# Patient Record
Sex: Female | Born: 1995
Health system: Southern US, Community
[De-identification: ages and names within clinical notes are randomized; demographics above are authoritative.]

## PROBLEM LIST (undated history)

## (undated) ENCOUNTER — Ambulatory Visit

## (undated) DIAGNOSIS — F32A Depression, unspecified: Secondary | ICD-10-CM

## (undated) DIAGNOSIS — F909 Attention-deficit hyperactivity disorder, unspecified type: Secondary | ICD-10-CM

## (undated) DIAGNOSIS — F329 Major depressive disorder, single episode, unspecified: Secondary | ICD-10-CM

---

## 2015-11-19 ENCOUNTER — Emergency Department (HOSPITAL_COMMUNITY)
Admission: EM | Admit: 2015-11-19 | Discharge: 2015-11-19 | Discharge status: Against Medical Advice | Payer: Medicaid Other | Attending: Emergency Medicine | Admitting: Emergency Medicine

## 2015-11-19 ENCOUNTER — Encounter (HOSPITAL_COMMUNITY): Payer: Self-pay | Admitting: Emergency Medicine

## 2015-11-19 DIAGNOSIS — R112 Nausea with vomiting, unspecified: Secondary | ICD-10-CM | POA: Diagnosis present

## 2015-11-19 DIAGNOSIS — R509 Fever, unspecified: Secondary | ICD-10-CM | POA: Diagnosis not present

## 2015-11-19 LAB — COMPREHENSIVE METABOLIC PANEL
ALK PHOS: 66 U/L (ref 38–126)
ALT: 11 U/L — AB (ref 14–54)
AST: 22 U/L (ref 15–41)
Albumin: 4.7 g/dL (ref 3.5–5.0)
Anion gap: 11 (ref 5–15)
BILIRUBIN TOTAL: 0.4 mg/dL (ref 0.3–1.2)
BUN: 6 mg/dL (ref 6–20)
CALCIUM: 9.7 mg/dL (ref 8.9–10.3)
CO2: 25 mmol/L (ref 22–32)
CREATININE: 0.75 mg/dL (ref 0.44–1.00)
Chloride: 110 mmol/L (ref 101–111)
GFR calc Af Amer: >60 mL/min (ref >60)
GLUCOSE: 98 mg/dL (ref 65–99)
Potassium: 3.4 mmol/L — ABNORMAL LOW (ref 3.5–5.1)
Sodium: 146 mmol/L — ABNORMAL HIGH (ref 135–145)
TOTAL PROTEIN: 7.8 g/dL (ref 6.5–8.1)

## 2015-11-19 LAB — CBC
HCT: 37.6 % (ref 36.0–46.0)
Hemoglobin: 12.5 g/dL (ref 12.0–15.0)
MCH: 24.4 pg — ABNORMAL LOW (ref 26.0–34.0)
MCHC: 33.2 g/dL (ref 30.0–36.0)
MCV: 73.4 fL — ABNORMAL LOW (ref 78.0–100.0)
PLATELETS: 155 10*3/uL (ref 150–400)
RBC: 5.12 MIL/uL — ABNORMAL HIGH (ref 3.87–5.11)
RDW: 15.6 % — AB (ref 11.5–15.5)
WBC: 7.4 10*3/uL (ref 4.0–10.5)

## 2015-11-19 LAB — LIPASE, BLOOD: Lipase: 22 U/L (ref 11–51)

## 2015-11-19 LAB — I-STAT BETA HCG BLOOD, ED (MC, WL, AP ONLY): I-stat hCG, quantitative: <5 m[IU]/mL (ref <5)

## 2015-11-19 NOTE — ED Notes (Signed)
Called for a second time, no answer 

## 2015-11-19 NOTE — ED Notes (Signed)
Called to take to room  No response from lobby  

## 2015-11-19 NOTE — ED Notes (Signed)
Called for a third time, no answer

## 2015-11-19 NOTE — ED Notes (Signed)
Per EMS pt complaint of n/v and fever for a week.

## 2016-05-08 ENCOUNTER — Emergency Department (HOSPITAL_COMMUNITY)
Admission: EM | Admit: 2016-05-08 | Discharge: 2016-05-08 | Disposition: A | Discharge status: Home / Self Care | Payer: Self-pay | Attending: Emergency Medicine | Admitting: Emergency Medicine

## 2016-05-08 ENCOUNTER — Encounter (HOSPITAL_COMMUNITY): Payer: Self-pay | Admitting: *Deleted

## 2016-05-08 ENCOUNTER — Emergency Department (HOSPITAL_COMMUNITY): Payer: Self-pay

## 2016-05-08 DIAGNOSIS — R55 Syncope and collapse: Secondary | ICD-10-CM

## 2016-05-08 LAB — CBC WITH DIFFERENTIAL/PLATELET
BASOS ABS: 0 10*3/uL (ref 0.0–0.1)
Basophils Relative: 1 %
EOS PCT: 1 %
Eosinophils Absolute: 0.1 10*3/uL (ref 0.0–0.7)
HEMATOCRIT: 35.9 % — AB (ref 36.0–46.0)
Hemoglobin: 12.1 g/dL (ref 12.0–15.0)
LYMPHS PCT: 20 %
Lymphs Abs: 1.1 10*3/uL (ref 0.7–4.0)
MCH: 24.4 pg — AB (ref 26.0–34.0)
MCHC: 33.7 g/dL (ref 30.0–36.0)
MCV: 72.5 fL — AB (ref 78.0–100.0)
MONO ABS: 0.4 10*3/uL (ref 0.1–1.0)
Monocytes Relative: 7 %
NEUTROS ABS: 4 10*3/uL (ref 1.7–7.7)
Neutrophils Relative %: 71 %
PLATELETS: 147 10*3/uL — AB (ref 150–400)
RBC: 4.95 MIL/uL (ref 3.87–5.11)
RDW: 15.9 % — AB (ref 11.5–15.5)
WBC: 5.6 10*3/uL (ref 4.0–10.5)

## 2016-05-08 LAB — COMPREHENSIVE METABOLIC PANEL
ALT: 12 U/L — ABNORMAL LOW (ref 14–54)
ANION GAP: 7 (ref 5–15)
AST: 21 U/L (ref 15–41)
Albumin: 4 g/dL (ref 3.5–5.0)
Alkaline Phosphatase: 66 U/L (ref 38–126)
BILIRUBIN TOTAL: 0.6 mg/dL (ref 0.3–1.2)
BUN: 5 mg/dL — ABNORMAL LOW (ref 6–20)
CHLORIDE: 108 mmol/L (ref 101–111)
CO2: 24 mmol/L (ref 22–32)
Calcium: 9.4 mg/dL (ref 8.9–10.3)
Creatinine, Ser: 0.88 mg/dL (ref 0.44–1.00)
Glucose, Bld: 90 mg/dL (ref 65–99)
POTASSIUM: 3.6 mmol/L (ref 3.5–5.1)
Sodium: 139 mmol/L (ref 135–145)
TOTAL PROTEIN: 7.2 g/dL (ref 6.5–8.1)

## 2016-05-08 LAB — I-STAT BETA HCG BLOOD, ED (MC, WL, AP ONLY)

## 2016-05-08 LAB — URINALYSIS, ROUTINE W REFLEX MICROSCOPIC
BILIRUBIN URINE: NEGATIVE
GLUCOSE, UA: NEGATIVE mg/dL
HGB URINE DIPSTICK: NEGATIVE
Ketones, ur: 15 mg/dL — AB
Nitrite: NEGATIVE
PROTEIN: NEGATIVE mg/dL
Specific Gravity, Urine: 1.013 (ref 1.005–1.030)
pH: 7 (ref 5.0–8.0)

## 2016-05-08 LAB — ETHANOL: Alcohol, Ethyl (B): <5 mg/dL (ref <5)

## 2016-05-08 LAB — RAPID URINE DRUG SCREEN, HOSP PERFORMED
AMPHETAMINES: NOT DETECTED
Barbiturates: NOT DETECTED
Benzodiazepines: NOT DETECTED
Cocaine: NOT DETECTED
OPIATES: NOT DETECTED
TETRAHYDROCANNABINOL: NOT DETECTED

## 2016-05-08 LAB — URINE MICROSCOPIC-ADD ON: RBC / HPF: NONE SEEN RBC/hpf (ref 0–5)

## 2016-05-08 LAB — TROPONIN I

## 2016-05-08 MED ORDER — SODIUM CHLORIDE 0.9 % IV BOLUS (SEPSIS)
1000.0000 mL | Freq: Once | INTRAVENOUS | Status: AC
  Administered 2016-05-08: 1000 mL via INTRAVENOUS

## 2016-05-08 NOTE — Discharge Instructions (Signed)
Syncope °Syncope means a person passes out (faints). The person usually wakes up in less than 5 minutes. It is important to seek medical care for syncope. °HOME CARE °· Have someone stay with you until you feel normal. °· Do not drive, use machines, or play sports until your doctor says it is okay. °· Keep all doctor visits as told. °· Lie down when you feel like you might pass out. Take deep breaths. Wait until you feel normal before standing up. °· Drink enough fluids to keep your pee (urine) clear or pale yellow. °· If you take blood pressure or heart medicine, get up slowly. Take several minutes to sit and then stand. °GET HELP RIGHT AWAY IF:  °· You have a severe headache. °· You have pain in the chest, belly (abdomen), or back. °· You are bleeding from the mouth or butt (rectum). °· You have black or tarry poop (stool). °· You have an irregular or very fast heartbeat. °· You have pain with breathing. °· You keep passing out, or you have shaking (seizures) when you pass out. °· You pass out when sitting or lying down. °· You feel confused. °· You have trouble walking. °· You have severe weakness. °· You have vision problems. °If you fainted, call for help (911 in [place] Do not drive yourself to the hospital. °  °This information is not intended to replace advice given to you by your health care provider. Make sure you discuss any questions you have with your health care provider. °  °Document Released: 04/07/2008 Document Revised: 03/06/2015 Document Reviewed: 12/19/2011 °Elsevier Interactive Patient Education ©2016 [employer] ° °

## 2016-05-08 NOTE — ED Provider Notes (Signed)
CSN: [id number]     Arrival date & time 05/08/16  1512 History   First MD Initiated Contact with Patient 05/08/16 1552     Chief Complaint  Patient presents with  . Seizures  Pt is a 20 yo bf who arrives via EMS for a possible seizure.  She has no history of seizures.  Pt said that all she's had to eat today is a Sprite early this am.  Pt does remember trying to catch the bus, and feeling funny and falling.  Pt remembers seeing hands and people trying to help her.  The pt said that she feels fine now.  She sustained no injury.   (Consider location/radiation/quality/duration/timing/severity/associated sxs/prior Treatment) Patient is a 20 y.o. female presenting with seizures. The history is provided by the patient.  Seizures Seizure activity on arrival: no   Seizure type:  Tonic Preceding symptoms: dizziness   Initial focality:  None Postictal symptoms: no confusion, no memory loss and no somnolence   Return to baseline: yes   Timing:  Once Progression:  Resolved Recent head injury:  No recent head injuries PTA treatment:  None History of seizures: no     History reviewed. No pertinent past medical history. History reviewed. No pertinent past surgical history. History reviewed. No pertinent family history. Social History  Substance Use Topics  . Smoking status: Unknown If Ever Smoked  . Smokeless tobacco: None  . Alcohol Use: No   OB History    No data available     Review of Systems  Neurological: Positive for seizures.  All other systems reviewed and are negative.     Allergies  Review of patient's allergies indicates no known allergies.  Home Medications   Prior to Admission medications   Not on File   BP 132/88 mmHg  Pulse 73  Resp 21  SpO2 100%  LMP 04/07/2016 Physical Exam  Constitutional: She is oriented to person, place, and time. She appears well-developed and well-nourished.  HENT:  Head: Normocephalic and atraumatic.  Right Ear: External ear  normal.  Left Ear: External ear normal.  Nose: Nose normal.  Mouth/Throat: Oropharynx is clear and moist.  Eyes: Conjunctivae and EOM are normal. Pupils are equal, round, and reactive to light.  Neck: Normal range of motion. Neck supple.  Cardiovascular: Normal rate, regular rhythm, normal heart sounds and intact distal pulses.   Pulmonary/Chest: Effort normal and breath sounds normal.  Abdominal: Soft. Bowel sounds are normal.  Musculoskeletal: Normal range of motion.  Neurological: She is alert and oriented to person, place, and time.  Skin: Skin is warm and dry.  Psychiatric: She has a normal mood and affect. Her behavior is normal. Judgment and thought content normal.  Nursing note and vitals reviewed.   ED Course  Procedures (including critical care time) Labs Review Labs Reviewed  CBC WITH DIFFERENTIAL/PLATELET - Abnormal; Notable for the following:    HCT 35.9 (*)    MCV 72.5 (*)    MCH 24.4 (*)    RDW 15.9 (*)    Platelets 147 (*)    All other components within normal limits  COMPREHENSIVE METABOLIC PANEL - Abnormal; Notable for the following:    BUN 5 (*)    ALT 12 (*)    All other components within normal limits  URINALYSIS, ROUTINE W REFLEX MICROSCOPIC (NOT AT [facility]) - Abnormal; Notable for the following:    APPearance CLOUDY (*)    Ketones, ur 15 (*)    Leukocytes, UA TRACE (*)  All other components within normal limits  URINE MICROSCOPIC-ADD ON - Abnormal; Notable for the following:    Squamous Epithelial / LPF 6-30 (*)    Bacteria, UA MANY (*)    Casts HYALINE CASTS (*)    All other components within normal limits  ETHANOL  TROPONIN I  URINE RAPID DRUG SCREEN, HOSP PERFORMED  I-STAT BETA HCG BLOOD, ED ([facility], [facility], [facility] ONLY)    Imaging Review Dg Chest 2 View  05/08/2016  CLINICAL DATA:  Seizure today, first episode. EXAM: CHEST  2 VIEW COMPARISON:  None. FINDINGS: Heart size is normal. Mediastinal shadows are normal. The lungs are clear. No bronchial  thickening. No infiltrate, mass, effusion or collapse. Pulmonary vascularity is normal. No bony abnormality other than mild spinal curvature. IMPRESSION: Normal chest other than mild spinal curvature. Electronically Signed   By: Paulina FusiMark  Shogry M.D.   On: 05/08/2016 17:41   Ct Head Wo Contrast  05/08/2016  CLINICAL DATA:  Witnessed tonic-clonic seizure today lasting 3 minutes, unknown past history EXAM: CT HEAD WITHOUT CONTRAST TECHNIQUE: Contiguous axial images were obtained from the base of the skull through the vertex without intravenous contrast. COMPARISON:  None FINDINGS: Normal ventricular morphology. No midline shift or mass effect. Normal appearance of brain parenchyma. No intracranial hemorrhage, mass lesion, or acute infarction. No extra-axial fluid collections. Visualized paranasal sinuses and mastoid air cells clear. Bones unremarkable. IMPRESSION: Normal exam. If patient has persistent or recurrent seizures, recommend MR imaging of the brain with and without contrast for further assessment. Electronically Signed   By: Ulyses SouthwardMark  Boles M.D.   On: 05/08/2016 17:52   I have personally reviewed and evaluated these images and lab results as part of my medical decision-making.   EKG Interpretation   Date/Time:  Thursday May 08 2016 15:20:06 EDT Ventricular Rate:  85 PR Interval:    QRS Duration: 80 QT Interval:  361 QTC Calculation: 430 R Axis:   46 Text Interpretation:  Sinus rhythm Confirmed by Jaymie Misch MD, Remas Sobel ([id number])  on 05/08/2016 4:29:08 PM      MDM  Pt's sx do not seem like seizure activity as she remembers the event.  I think it was a vaso vagal episode due to lack of eating and drinking.  Pt will be given the number to neurology to f/u with however.  Pt knows to drink lots of fluid and to return for worsening of sx.  Final diagnoses:  Vaso vagal episode       Jacalyn LefevreJulie Dillyn Menna, MD 05/08/16 1800

## 2016-05-08 NOTE — ED Notes (Signed)
Spoke to [name] and asked her to send tube down to

## 2016-05-08 NOTE — ED Notes (Signed)
Pt is in stable condition upon d/c and ambulates from ED. 

## 2016-05-08 NOTE — ED Notes (Signed)
[place] [facility] [place]. Pt had a witnessed tonic clonic seizure today that lasted approx. 3 min. Pt states she has no hx of seizures and denies ETOH/drug use.

## 2016-08-19 ENCOUNTER — Encounter (HOSPITAL_COMMUNITY): Payer: Self-pay | Admitting: *Deleted

## 2016-08-19 ENCOUNTER — Emergency Department (HOSPITAL_COMMUNITY)
Admission: EM | Admit: 2016-08-19 | Discharge: 2016-08-19 | Disposition: A | Discharge status: Home / Self Care | Payer: Medicaid Other | Attending: Emergency Medicine | Admitting: Emergency Medicine

## 2016-08-19 DIAGNOSIS — R51 Headache: Secondary | ICD-10-CM | POA: Insufficient documentation

## 2016-08-19 DIAGNOSIS — R519 Headache, unspecified: Secondary | ICD-10-CM

## 2016-08-19 DIAGNOSIS — F909 Attention-deficit hyperactivity disorder, unspecified type: Secondary | ICD-10-CM | POA: Insufficient documentation

## 2016-08-19 HISTORY — DX: Major depressive disorder, single episode, unspecified: F32.9

## 2016-08-19 HISTORY — DX: Depression, unspecified: F32.A

## 2016-08-19 HISTORY — DX: Attention-deficit hyperactivity disorder, unspecified type: F90.9

## 2016-08-19 LAB — CBC
HEMATOCRIT: 35 % — AB (ref 36.0–46.0)
HEMOGLOBIN: 11.7 g/dL — AB (ref 12.0–15.0)
MCH: 23.3 pg — ABNORMAL LOW (ref 26.0–34.0)
MCHC: 33.4 g/dL (ref 30.0–36.0)
MCV: 69.6 fL — AB (ref 78.0–100.0)
Platelets: 195 10*3/uL (ref 150–400)
RBC: 5.03 MIL/uL (ref 3.87–5.11)
RDW: 16.1 % — AB (ref 11.5–15.5)
WBC: 4.4 10*3/uL (ref 4.0–10.5)

## 2016-08-19 LAB — BASIC METABOLIC PANEL
ANION GAP: 5 (ref 5–15)
BUN: 9 mg/dL (ref 6–20)
CALCIUM: 9.3 mg/dL (ref 8.9–10.3)
CHLORIDE: 109 mmol/L (ref 101–111)
CO2: 26 mmol/L (ref 22–32)
Creatinine, Ser: 0.63 mg/dL (ref 0.44–1.00)
GFR calc non Af Amer: >60 mL/min (ref >60)
GLUCOSE: 95 mg/dL (ref 65–99)
POTASSIUM: 3.6 mmol/L (ref 3.5–5.1)
Sodium: 140 mmol/L (ref 135–145)

## 2016-08-19 LAB — POC URINE PREG, ED: Preg Test, Ur: NEGATIVE

## 2016-08-19 MED ORDER — BUTALBITAL-APAP-CAFFEINE 50-325-40 MG PO TABS: 1.0000 | ORAL_TABLET | Freq: Four times a day (QID) | ORAL | 0 refills | Status: AC | PRN

## 2016-08-19 NOTE — ED Provider Notes (Signed)
[facility]-EMERGENCY DEPT Provider Note   CSN: [id number] Arrival date & time: 08/19/16  1436     History   Chief Complaint Chief Complaint  Patient presents with  . Anxiety    HPI Cassidy Bruce is a 20 y.o. female.  HPI   20 year old female present c/o Headache. Patient states for the past week she has had intermittent frontal headache, with associated light sensitivity, occasional dizziness, and short-term memory loss. She is currently a Consulting civil engineer at [employer] She went to see her guidance counselor today and was telling her counselor about a headache. Counselor encouraged patient to go to ER for further evaluation. While in the office, patient reported feeling nauseous, and vomited once in the trash and her counselor call EMS and patient was brought here for further evaluation. She still endorsed a frontal headache, rated as 5 out of 10, with light sensitivity. She denies having fever, diplopia, loss of vision, URI symptoms, neck stiffness, rash, focal numbness or weakness. Denies any abnormal weight changes. No fever or night sweats. No confusion. She is now requesting to be discharged so she can return back to work. She denies any recent caffeine intake, no thyroid disease, denies SI/HI.      Past Medical History:  Diagnosis Date  . ADHD   . Depression     There are no active problems to display for this patient.   History reviewed. No pertinent surgical history.  OB History    No data available       Home Medications    Prior to Admission medications   Not on File    Family History No family history on file.  Social History Social History  Substance Use Topics  . Smoking status: Unknown If Ever Smoked  . Smokeless tobacco: Never Used  . Alcohol use No     Allergies   Review of patient's allergies indicates no known allergies.   Review of Systems Review of Systems  All other systems reviewed and are negative.    Physical Exam Updated Vital Signs BP  123/76   Pulse 82   Temp 98.6 F (37 C)   Resp 16   Ht 5\' 4"  (1.626 m)   Wt 52.2 kg   SpO2 100%   BMI 19.74 kg/m   Physical Exam  Constitutional: She is oriented to person, place, and time. She appears well-developed and well-nourished. No distress.  HENT:  Head: Atraumatic.  Right Ear: External ear normal.  Left Ear: External ear normal.  Nose: Nose normal.  Mouth/Throat: Oropharynx is clear and moist.  Eyes: Conjunctivae and EOM are normal. Pupils are equal, round, and reactive to light.  Neck: Normal range of motion. Neck supple.  No nuchal rigidity  Cardiovascular: Normal rate and regular rhythm.   Pulmonary/Chest: Effort normal and breath sounds normal.  Abdominal: Soft. Bowel sounds are normal.  Neurological: She is alert and oriented to person, place, and time. No cranial nerve deficit or sensory deficit. She displays a negative Romberg sign. GCS eye subscore is 4. GCS verbal subscore is 5. GCS motor subscore is 6.  Skin: No rash noted.  Psychiatric: She has a normal mood and affect. Her speech is normal and behavior is normal. Thought content normal.  Nursing note and vitals reviewed.    ED Treatments / Results  Labs (all labs ordered are listed, but only abnormal results are displayed) Labs Reviewed  CBC - Abnormal; Notable for the following:       Result Value  Hemoglobin 11.7 (*)    HCT 35.0 (*)    MCV 69.6 (*)    MCH 23.3 (*)    RDW 16.1 (*)    All other components within normal limits  BASIC METABOLIC PANEL  POC URINE PREG, ED    EKG  EKG Interpretation None       Radiology No results found.  Procedures Procedures (including critical care time)  Medications Ordered in ED Medications - No data to display   Initial Impression / Assessment and Plan / ED Course  I have reviewed the triage vital signs and the nursing notes.  Pertinent labs & imaging results that were available during my care of the patient were reviewed by me and considered  in my medical decision making (see chart for details).  Clinical Course    BP 123/76   Pulse 82   Temp 98.6 F (37 C)   Resp 16   Ht 5\' 4"  (1.626 m)   Wt 52.2 kg   SpO2 100%   BMI 19.74 kg/m    Final Clinical Impressions(s) / ED Diagnoses   Final diagnoses:  Bad headache    New Prescriptions New Prescriptions   BUTALBITAL-ACETAMINOPHEN-CAFFEINE (FIORICET, ESGIC) 50-325-40 MG TABLET    Take 1-2 tablets by mouth every 6 (six) hours as needed for headache.   7:00 PM Patient complaining of headache. No red flags. No fever or nuchal rigidity concerning for meningitis, no acute onset thunderclap headache concerning for subarachnoid hemorrhage, no focal neuro deficit concerning for stroke. Will provide symptomatically treatment. She report mild anxiety but no concerning aspects that would warrant psychiatric evaluation at this time. Patient stable for discharge. Suspect tension headache causing her sxs.      Fayrene HelperBowie Mahalia Dykes, PA-C 08/19/16 1904    Nira ConnPedro Eduardo Cardama, MD 08/22/16 [phone]

## 2016-08-19 NOTE — ED Triage Notes (Signed)
Per EMS - [facility] regarding migraine headaches and anxiety she has been experiencing.  Patient states she remains dizzy, but her gait is stead and she ambulates without assistance on arrival to triage.  Patient denies headache currently and her only complaint is anxiety and dizziness.  Patient states these episodes have been intermittent x2 weeks.  Patient's vitals WNL 128/75, HR 80, RR 12, 97% on RA, CBG 101.

## 2016-11-10 ENCOUNTER — Emergency Department (HOSPITAL_COMMUNITY)
Admission: EM | Admit: 2016-11-10 | Discharge: 2016-11-11 | Disposition: A | Discharge status: Home / Self Care | Payer: Self-pay | Attending: Emergency Medicine | Admitting: Emergency Medicine

## 2016-11-10 ENCOUNTER — Emergency Department (HOSPITAL_COMMUNITY): Payer: Self-pay

## 2016-11-10 ENCOUNTER — Encounter (HOSPITAL_COMMUNITY): Payer: Self-pay

## 2016-11-10 DIAGNOSIS — F909 Attention-deficit hyperactivity disorder, unspecified type: Secondary | ICD-10-CM | POA: Insufficient documentation

## 2016-11-10 DIAGNOSIS — R0781 Pleurodynia: Secondary | ICD-10-CM

## 2016-11-10 DIAGNOSIS — R071 Chest pain on breathing: Secondary | ICD-10-CM | POA: Insufficient documentation

## 2016-11-10 DIAGNOSIS — R55 Syncope and collapse: Secondary | ICD-10-CM | POA: Insufficient documentation

## 2016-11-10 LAB — I-STAT BETA HCG BLOOD, ED (MC, WL, AP ONLY): I-stat hCG, quantitative: <5 m[IU]/mL (ref <5)

## 2016-11-10 LAB — CBC WITH DIFFERENTIAL/PLATELET
BASOS ABS: 0 10*3/uL (ref 0.0–0.1)
Basophils Relative: 0 %
EOS PCT: 1 %
Eosinophils Absolute: 0.1 10*3/uL (ref 0.0–0.7)
HEMATOCRIT: 34.1 % — AB (ref 36.0–46.0)
Hemoglobin: 11.2 g/dL — ABNORMAL LOW (ref 12.0–15.0)
LYMPHS ABS: 0.5 10*3/uL — AB (ref 0.7–4.0)
Lymphocytes Relative: 6 %
MCH: 22.8 pg — AB (ref 26.0–34.0)
MCHC: 32.8 g/dL (ref 30.0–36.0)
MCV: 69.5 fL — AB (ref 78.0–100.0)
MONOS PCT: 12 %
Monocytes Absolute: 0.9 10*3/uL (ref 0.1–1.0)
NEUTROS ABS: 6.3 10*3/uL (ref 1.7–7.7)
Neutrophils Relative %: 81 %
Platelets: 138 10*3/uL — ABNORMAL LOW (ref 150–400)
RBC: 4.91 MIL/uL (ref 3.87–5.11)
RDW: 16.2 % — AB (ref 11.5–15.5)
WBC: 7.8 10*3/uL (ref 4.0–10.5)

## 2016-11-10 LAB — I-STAT CHEM 8, ED
BUN: 6 mg/dL (ref 6–20)
CALCIUM ION: 1.21 mmol/L (ref 1.15–1.40)
Chloride: 104 mmol/L (ref 101–111)
Creatinine, Ser: 0.7 mg/dL (ref 0.44–1.00)
GLUCOSE: 98 mg/dL (ref 65–99)
HCT: 37 % (ref 36.0–46.0)
HEMOGLOBIN: 12.6 g/dL (ref 12.0–15.0)
Potassium: 3.8 mmol/L (ref 3.5–5.1)
Sodium: 140 mmol/L (ref 135–145)
TCO2: 27 mmol/L (ref 0–100)

## 2016-11-10 LAB — D-DIMER, QUANTITATIVE: D-Dimer, Quant: 0.76 ug/mL-FEU — ABNORMAL HIGH (ref 0.00–0.50)

## 2016-11-10 MED ORDER — IOPAMIDOL (ISOVUE-370) INJECTION 76%
INTRAVENOUS | Status: AC
  Administered 2016-11-10: 70 mL
  Filled 2016-11-10: qty 100

## 2016-11-10 MED ORDER — MORPHINE SULFATE (PF) 4 MG/ML IV SOLN
4.0000 mg | Freq: Once | INTRAVENOUS | Status: AC
Start: 2016-11-10 — End: 2016-11-11
  Administered 2016-11-11: 4 mg via INTRAVENOUS
  Filled 2016-11-10: qty 1

## 2016-11-10 MED ORDER — SODIUM CHLORIDE 0.9 % IV SOLN
INTRAVENOUS | Status: DC
  Administered 2016-11-10: 22:00:00 via INTRAVENOUS

## 2016-11-10 MED ORDER — HYDROCODONE-ACETAMINOPHEN 5-325 MG PO TABS: 1.0000 | ORAL_TABLET | ORAL | Status: DC

## 2016-11-10 NOTE — ED Triage Notes (Signed)
Pt presents to the er after having some seizure like activity at work and falling and hitting her head, she was still having seizure like activity on ems arrival, however she was not posticital afterwards, she has a history of the same, alert and oriented on arrival to the ed, with complaints of pain to her head and pelvis. Able to move all extremities.

## 2016-11-10 NOTE — ED Provider Notes (Signed)
[facility]-EMERGENCY DEPT Provider Note   CSN: [id number] Arrival date & time: 11/10/16  2111     History   Chief Complaint Chief Complaint  Patient presents with  . Seizures    HPI Cassidy Bruce is a 21 y.o. female.  HPI Patient presents to the emergency room  after a syncopal episode. Patient was at work today when she experienced some sharp chest pain. She started developing trouble with a dry cough yesterday.  Patient has had some pain with coughing and inspiration. At work she spends another episode of the cost and her chest hurting and she began feeling lightheaded. Next thing she remembers is being on the ground at work with paramedics. Coworkers saw her fall to the ground. There is question of whether she had some shaking after the event. There was no confusion once she regained consciousness. Patient is currently on her menstrual period Past Medical History:  Diagnosis Date  . ADHD   . Depression     There are no active problems to display for this patient.   History reviewed. No pertinent surgical history.  OB History    No data available       Home Medications    Prior to Admission medications   Medication Sig Start Date End Date Taking? Authorizing Provider  butalbital-acetaminophen-caffeine (FIORICET, ESGIC) 50-325-40 MG tablet Take 1-2 tablets by mouth every 6 (six) hours as needed for headache. 08/19/16 08/19/17 Yes Fayrene Helper, PA-C  naproxen (NAPROSYN) 375 MG tablet Take 1 tablet (375 mg total) by mouth 2 (two) times daily. 11/11/16   Linwood Dibbles, MD    Family History No family history on file.  Social History Social History  Substance Use Topics  . Smoking status: Unknown If Ever Smoked  . Smokeless tobacco: Never Used  . Alcohol use No     Allergies   Patient has no known allergies.   Review of Systems Review of Systems  All other systems reviewed and are negative.    Physical Exam Updated Vital Signs BP 112/64   Pulse 100   Resp 24   Ht  5\' 4"  (1.626 m)   Wt 45.4 kg   LMP 11/07/2016   SpO2 100%   BMI 17.16 kg/m   Physical Exam  Constitutional: No distress.  HENT:  Head: Normocephalic and atraumatic.  Right Ear: External ear normal.  Left Ear: External ear normal.  Eyes: Conjunctivae are normal. Right eye exhibits no discharge. Left eye exhibits no discharge. No scleral icterus.  Neck: Neck supple. No tracheal deviation present.  Cardiovascular: Normal rate, regular rhythm and intact distal pulses.   Pulmonary/Chest: Effort normal and breath sounds normal. No stridor. No respiratory distress. She has no wheezes. She has no rales.  Abdominal: Soft. Bowel sounds are normal. She exhibits no distension. There is tenderness (mild suprapubic). There is no rebound and no guarding.  Musculoskeletal: She exhibits no edema or tenderness.  No cervical, thoracic or lumbar spinal tenderness, no tenderness to palpation in her extremities, no evidence of injury  Neurological: She is alert. She has normal strength. No cranial nerve deficit (no facial droop, extraocular movements intact, no slurred speech) or sensory deficit. She exhibits normal muscle tone. She displays no seizure activity. Coordination normal.  Skin: Skin is warm and dry. No rash noted.  Psychiatric: She has a normal mood and affect.  Nursing note and vitals reviewed.    ED Treatments / Results  Labs (all labs ordered are listed, but only abnormal results are displayed)  Labs Reviewed  CBC WITH DIFFERENTIAL/PLATELET - Abnormal; Notable for the following:       Result Value   Hemoglobin 11.2 (*)    HCT 34.1 (*)    MCV 69.5 (*)    MCH 22.8 (*)    RDW 16.2 (*)    Platelets 138 (*)    Lymphs Abs 0.5 (*)    All other components within normal limits  D-DIMER, QUANTITATIVE (NOT AT [facility]) - Abnormal; Notable for the following:    D-Dimer, Quant 0.76 (*)    All other components within normal limits  I-STAT CHEM 8, ED  I-STAT BETA HCG BLOOD, ED ([facility], [facility], [facility] ONLY)    POCT CBG (FASTING - GLUCOSE)-MANUAL ENTRY    Radiology Dg Chest 2 View  Result Date: 11/10/2016 CLINICAL DATA:  Cough, syncope, chest pain, and headache. Possible seizure. Possibly passed out. Vomiting. EXAM: CHEST  2 VIEW COMPARISON:  05/08/2016 FINDINGS: The heart size and mediastinal contours are within normal limits. Both lungs are clear. The visualized skeletal structures are unremarkable. IMPRESSION: No active cardiopulmonary disease. Electronically Signed   By: Burman Nieves M.D.   On: 11/10/2016 22:02   Ct Head Wo Contrast  Result Date: 11/10/2016 CLINICAL DATA:  Syncopal episode and headache EXAM: CT HEAD WITHOUT CONTRAST TECHNIQUE: Contiguous axial images were obtained from the base of the skull through the vertex without intravenous contrast. COMPARISON:  05/08/2016 FINDINGS: Brain: No evidence of acute infarction, hemorrhage, hydrocephalus, extra-axial collection or mass lesion/mass effect. Vascular: No hyperdense vessel or unexpected calcification. Skull: Normal. Negative for fracture or focal lesion. Sinuses/Orbits: No acute finding. Other: None IMPRESSION: No CT evidence for acute intracranial abnormality Electronically Signed   By: Jasmine Pang M.D.   On: 11/10/2016 22:14   Ct Angio Chest Pe W And/or Wo Contrast  Result Date: 11/11/2016 CLINICAL DATA:  Chest pain and syncope at work.  Elevated D-dimer. EXAM: CT ANGIOGRAPHY CHEST WITH CONTRAST TECHNIQUE: Multidetector CT imaging of the chest was performed using the standard protocol during bolus administration of intravenous contrast. Multiplanar CT image reconstructions and MIPs were obtained to evaluate the vascular anatomy. CONTRAST:  70 mL Isovue 370 COMPARISON:  None. FINDINGS: Cardiovascular: Satisfactory opacification of the pulmonary arteries to the segmental level. No evidence of pulmonary embolism. Normal heart size. No pericardial effusion. Normal caliber thoracic aorta. No aortic dissection. Mediastinum/Nodes: No enlarged  mediastinal, hilar, or axillary lymph nodes. Thyroid gland, trachea, and esophagus demonstrate no significant findings. Increased density in the anterior mediastinum consistent with residual thymic tissue. Lungs/Pleura: Lungs are clear. No pleural effusion or pneumothorax. Upper Abdomen: No acute abnormality. Musculoskeletal: No chest wall abnormality. No acute or significant osseous findings. Review of the MIP images confirms the above findings. IMPRESSION: No evidence of significant pulmonary embolus. No evidence of active pulmonary disease. Electronically Signed   By: Burman Nieves M.D.   On: 11/11/2016 00:05    Procedures Procedures (including critical care time)  Medications Ordered in ED Medications  0.9 %  sodium chloride infusion ( Intravenous Stopped 11/11/16 0010)  morphine 4 MG/ML injection 4 mg (4 mg Intravenous Given 11/11/16 0024)  iopamidol (ISOVUE-370) 76 % injection (70 mLs  Contrast Given 11/10/16 2347)     Initial Impression / Assessment and Plan / ED Course  I have reviewed the triage vital signs and the nursing notes.  Pertinent labs & imaging results that were available during my care of the patient were reviewed by me and considered in my medical decision making (see chart for  details).  Clinical Course as of Nov 12 43  Sheral FlowMon Nov 10, 2016  2238 EKG sinus tachycardia rate 101, normal axis, normal intervals  [JK]    Clinical Course User Index [JK] Linwood DibblesJon Kadey Mihalic, MD    Patient presented to emergency room with chest pain followed by an event that sounds most like syncope rather than a seizure. She did not have any postictal phase. The patient had no recurrent episodes in the emergency room she remained alert and oriented.  Regarding her chest pain she did have a d-dimer because of the sinus tachycardia. This was mildly elevated. CT scan of the chest was performed. Patient does not have any evidence of pulmonary embolism or any other acute abnormality.  Symptoms may be related  to pleurisy. At this point I do not find any evidence of any emergency medical condition. Patient is stable for discharge. I recommend follow-up with primary doctor. Consider outpatient EEG testing or Holter monitor testing  Final Clinical Impressions(s) / ED Diagnoses   Final diagnoses:  Vasovagal syncope  Pleuritic chest pain    New Prescriptions New Prescriptions   NAPROXEN (NAPROSYN) 375 MG TABLET    Take 1 tablet (375 mg total) by mouth 2 (two) times daily.     Linwood DibblesJon Jacklyne Baik, MD 11/11/16 [phone]

## 2016-11-10 NOTE — ED Notes (Signed)
Patient transported to X-ray 

## 2016-11-10 NOTE — ED Notes (Signed)
Patient transported to CT 

## 2016-11-11 MED ORDER — NAPROXEN 375 MG PO TABS: 375.0000 mg | ORAL_TABLET | Freq: Two times a day (BID) | ORAL | 0 refills | Status: DC

## 2016-11-11 NOTE — ED Notes (Signed)
Pt to BR with slow, steady gait.  Mildly off balance.  Holding onto family for support.

## 2016-11-11 NOTE — ED Provider Notes (Signed)
At time of discharge, pt noted to have fever She is awake/alert, no distress Imaging reveals no Pneumonia Denies dysuria She has had recent cough She denies HA at this time Will d/c home    Zadie Rhineonald Julian Medina, MD 11/11/16 [phone]

## 2016-11-11 NOTE — ED Notes (Signed)
Attending notified of pt's fever; temperature not taken in triage.  He spoke to patient, cleared her to go home with instructions to take tylenol or motrin for fever.

## 2016-11-11 NOTE — ED Notes (Signed)
Pt started feeling very hot during morphine administration.  This RN stopped pushing the morphine and drew back, then flushed blood back into patient.  Dr. Lynelle DoctorKnapp in room and aware.  The morphine was diluted 1:1 with saline.  0.5 mg of fluid left.  Will waste with another RN.

## 2016-12-01 ENCOUNTER — Emergency Department (HOSPITAL_COMMUNITY)
Admission: EM | Admit: 2016-12-01 | Discharge: 2016-12-01 | Disposition: A | Discharge status: Home / Self Care | Payer: Medicaid Other | Attending: Emergency Medicine | Admitting: Emergency Medicine

## 2016-12-01 ENCOUNTER — Encounter (HOSPITAL_COMMUNITY): Payer: Self-pay | Admitting: Emergency Medicine

## 2016-12-01 DIAGNOSIS — N12 Tubulo-interstitial nephritis, not specified as acute or chronic: Secondary | ICD-10-CM | POA: Insufficient documentation

## 2016-12-01 DIAGNOSIS — F909 Attention-deficit hyperactivity disorder, unspecified type: Secondary | ICD-10-CM | POA: Insufficient documentation

## 2016-12-01 LAB — CBC
HEMATOCRIT: 36.6 % (ref 36.0–46.0)
HEMOGLOBIN: 12.2 g/dL (ref 12.0–15.0)
MCH: 23.2 pg — AB (ref 26.0–34.0)
MCHC: 33.3 g/dL (ref 30.0–36.0)
MCV: 69.7 fL — AB (ref 78.0–100.0)
Platelets: 146 10*3/uL — ABNORMAL LOW (ref 150–400)
RBC: 5.25 MIL/uL — ABNORMAL HIGH (ref 3.87–5.11)
RDW: 16.7 % — ABNORMAL HIGH (ref 11.5–15.5)
WBC: 8.4 10*3/uL (ref 4.0–10.5)

## 2016-12-01 LAB — BASIC METABOLIC PANEL
ANION GAP: 7 (ref 5–15)
BUN: 7 mg/dL (ref 6–20)
CHLORIDE: 108 mmol/L (ref 101–111)
CO2: 24 mmol/L (ref 22–32)
Calcium: 9.1 mg/dL (ref 8.9–10.3)
Creatinine, Ser: 0.69 mg/dL (ref 0.44–1.00)
GFR calc Af Amer: >60 mL/min (ref >60)
GFR calc non Af Amer: >60 mL/min (ref >60)
GLUCOSE: 90 mg/dL (ref 65–99)
POTASSIUM: 4.1 mmol/L (ref 3.5–5.1)
Sodium: 139 mmol/L (ref 135–145)

## 2016-12-01 LAB — URINALYSIS, ROUTINE W REFLEX MICROSCOPIC
Bilirubin Urine: NEGATIVE
GLUCOSE, UA: NEGATIVE mg/dL
KETONES UR: NEGATIVE mg/dL
Nitrite: POSITIVE — AB
PH: 7 (ref 5.0–8.0)
Protein, ur: 30 mg/dL — AB
SPECIFIC GRAVITY, URINE: 1.013 (ref 1.005–1.030)

## 2016-12-01 LAB — HEPATIC FUNCTION PANEL
ALK PHOS: 62 U/L (ref 38–126)
ALT: 9 U/L — AB (ref 14–54)
AST: 16 U/L (ref 15–41)
Albumin: 3.7 g/dL (ref 3.5–5.0)
BILIRUBIN TOTAL: 0.6 mg/dL (ref 0.3–1.2)
Bilirubin, Direct: <0.1 mg/dL — ABNORMAL LOW (ref 0.1–0.5)
Total Protein: 7 g/dL (ref 6.5–8.1)

## 2016-12-01 MED ORDER — CIPROFLOXACIN HCL 500 MG PO TABS: 500.0000 mg | ORAL_TABLET | Freq: Two times a day (BID) | ORAL | 0 refills | Status: DC

## 2016-12-01 MED ORDER — NAPROXEN 500 MG PO TABS: 500.0000 mg | ORAL_TABLET | Freq: Two times a day (BID) | ORAL | 0 refills | Status: DC

## 2016-12-01 NOTE — ED Provider Notes (Signed)
[facility]-EMERGENCY DEPT Provider Note   CSN: [id number] Arrival date & time: 12/01/16  1115     History   Chief Complaint Chief Complaint  Patient presents with  . Flank Pain  . Dysuria    HPI Cassidy Bruce is a 21 y.o. female.  HPI   Cassidy Bruce is a 21 y.o. female, patient with no pertinent past medical history, presenting to the ED with right flank and right lower back pain for the last week. Pain is sharp, mild to moderate, nonradiating. Accompanied by dysuria and possible hematuria. States that the last time she had these symptoms, she was diagnosed with a kidney infection. She has not taken any medications for her symptoms. Denies N/V/D, abdominal pain, fever/chills, or any other complaints.    Past Medical History:  Diagnosis Date  . ADHD   . Depression     There are no active problems to display for this patient.   History reviewed. No pertinent surgical history.  OB History    No data available       Home Medications    Prior to Admission medications   Medication Sig Start Date End Date Taking? Authorizing Provider  butalbital-acetaminophen-caffeine (FIORICET, ESGIC) 50-325-40 MG tablet Take 1-2 tablets by mouth every 6 (six) hours as needed for headache. 08/19/16 08/19/17  Fayrene HelperBowie Tran, PA-C  ciprofloxacin (CIPRO) 500 MG tablet Take 1 tablet (500 mg total) by mouth 2 (two) times daily. 12/01/16   Journi Moffa C Jernie Schutt, PA-C  naproxen (NAPROSYN) 375 MG tablet Take 1 tablet (375 mg total) by mouth 2 (two) times daily. 11/11/16   Linwood DibblesJon Knapp, MD  naproxen (NAPROSYN) 500 MG tablet Take 1 tablet (500 mg total) by mouth 2 (two) times daily. 12/01/16   Anselm PancoastShawn C Jacqulynn Shappell, PA-C    Family History History reviewed. No pertinent family history.  Social History Social History  Substance Use Topics  . Smoking status: Unknown If Ever Smoked  . Smokeless tobacco: Never Used  . Alcohol use No     Allergies   Patient has no known allergies.   Review of Systems Review of Systems    Constitutional: Negative for chills and fever.  Respiratory: Negative for shortness of breath.   Gastrointestinal: Negative for abdominal pain, diarrhea, nausea and vomiting.  Genitourinary: Positive for dysuria, flank pain and hematuria. Negative for pelvic pain and vaginal discharge.  All other systems reviewed and are negative.    Physical Exam Updated Vital Signs BP 138/72 (BP Location: Right Arm)   Pulse 77   Temp 98.4 F (36.9 C) (Oral)   Resp 16   Ht 5\' 4"  (1.626 m)   Wt 49.9 kg   LMP 11/07/2016   SpO2 98%   BMI 18.88 kg/m   Physical Exam  Constitutional: She appears well-developed and well-nourished. No distress.  HENT:  Head: Normocephalic and atraumatic.  Eyes: Conjunctivae are normal.  Neck: Neck supple.  Cardiovascular: Normal rate, regular rhythm, normal heart sounds and intact distal pulses.   Pulmonary/Chest: Effort normal and breath sounds normal. No respiratory distress.  Abdominal: Soft. There is no tenderness. There is CVA tenderness (right). There is no guarding.  Right flank tenderness.  Musculoskeletal: She exhibits no edema.  Lymphadenopathy:    She has no cervical adenopathy.  Neurological: She is alert.  Skin: Skin is warm and dry. She is not diaphoretic.  Psychiatric: She has a normal mood and affect. Her behavior is normal.  Nursing note and vitals reviewed.    ED Treatments / Results  Labs (all labs ordered are listed, but only abnormal results are displayed) Labs Reviewed  URINALYSIS, ROUTINE W REFLEX MICROSCOPIC - Abnormal; Notable for the following:       Result Value   APPearance CLOUDY (*)    Hgb urine dipstick MODERATE (*)    Protein, ur 30 (*)    Nitrite POSITIVE (*)    Leukocytes, UA LARGE (*)    All other components within normal limits  CBC - Abnormal; Notable for the following:    RBC 5.25 (*)    MCV 69.7 (*)    MCH 23.2 (*)    RDW 16.7 (*)    Platelets 146 (*)    All other components within normal limits  HEPATIC  FUNCTION PANEL - Abnormal; Notable for the following:    ALT 9 (*)    Bilirubin, Direct <0.1 (*)    All other components within normal limits  URINE CULTURE  BASIC METABOLIC PANEL  POC URINE PREG, ED    EKG  EKG Interpretation None       Radiology No results found.  Procedures Procedures (including critical care time)  Medications Ordered in ED Medications - No data to display   Initial Impression / Assessment and Plan / ED Course  I have reviewed the triage vital signs and the nursing notes.  Pertinent labs & imaging results that were available during my care of the patient were reviewed by me and considered in my medical decision making (see chart for details).       Patient presents with right flank pain and dysuria. Evidence of pyelonephritis. Patient is nontoxic appearing and has no signs of sepsis. Further care and return precautions discussed. Patient voices understanding of all instructions and is comfortable with discharge.  Vitals:   12/01/16 1239 12/01/16 1415  BP: 138/72 125/69  Pulse: 77 76  Resp: 16 18  Temp: 98.4 F (36.9 C) 97.5 F (36.4 C)  TempSrc: Oral Oral  SpO2: 98% 100%  Weight: 49.9 kg   Height: 5\' 4"  (1.626 m)      Final Clinical Impressions(s) / ED Diagnoses   Final diagnoses:  Pyelonephritis    New Prescriptions Discharge Medication List as of 12/01/2016  2:09 PM    START taking these medications   Details  ciprofloxacin (CIPRO) 500 MG tablet Take 1 tablet (500 mg total) by mouth 2 (two) times daily., Starting Mon 12/01/2016, Print    !! naproxen (NAPROSYN) 500 MG tablet Take 1 tablet (500 mg total) by mouth 2 (two) times daily., Starting Mon 12/01/2016, Print     !! - Potential duplicate medications found. Please discuss with provider.       Anselm Pancoast, PA-C 12/01/16 1431    Cathren Laine, MD 12/01/16 [phone]

## 2016-12-01 NOTE — ED Triage Notes (Signed)
Pt arrives via POv from home with right flank pain and dysuria that began approx 1 week ago. Denies recent fever. Pt awake, alert, appropriate VSS.

## 2016-12-01 NOTE — Discharge Instructions (Signed)
There is evidence of a kidney infection called pyelonephritis on the lab work today. Please take all of your antibiotics until finished!   You may develop abdominal discomfort or diarrhea from the antibiotic.  You may help offset this with probiotics which you can buy or get in yogurt. Do not eat or take the probiotics until 2 hours after your antibiotic.  Follow up with a primary care provider for any further management of this issue. Return to the ED should symptoms worsen.

## 2016-12-03 LAB — URINE CULTURE

## 2016-12-04 ENCOUNTER — Telehealth (HOSPITAL_BASED_OUTPATIENT_CLINIC_OR_DEPARTMENT_OTHER): Payer: Self-pay

## 2016-12-04 NOTE — Telephone Encounter (Signed)
Post ED Visit - Positive Culture Follow-up  Culture report reviewed by antimicrobial stewardship pharmacist:  []  Enzo BiNathan Batchelder, Pharm.D. []  Celedonio MiyamotoJeremy Frens, Pharm.D., BCPS []  Garvin FilaMike Maccia, Pharm.D. []  Georgina PillionElizabeth Martin, Pharm.D., BCPS []  SewardMinh Pham, [address]Pharm.D., BCPS, AAHIVP []  Estella HuskMichelle Turner, Pharm.D., BCPS, AAHIVP []  Tennis Mustassie Stewart, Pharm.D. []  Sherle Poeob Vincent, [address]Ph[address]Estella Husk. X  Joseph Arminger, Pharm.D.  Positive urine culture>/= 100,000 colonies -> E Coli Treated with Ciprofloxacin, organism sensitive to the same and no further patient follow-up is required at this time.  Arvid RightClark, Ysabelle Goodroe Dorn 12/04/2016, 10:51 AM

## 2017-03-01 ENCOUNTER — Emergency Department (HOSPITAL_COMMUNITY): Payer: Medicaid Other

## 2017-03-01 ENCOUNTER — Emergency Department (HOSPITAL_COMMUNITY)
Admission: EM | Admit: 2017-03-01 | Discharge: 2017-03-01 | Disposition: A | Discharge status: Home / Self Care | Payer: Medicaid Other | Attending: Emergency Medicine | Admitting: Emergency Medicine

## 2017-03-01 ENCOUNTER — Encounter (HOSPITAL_COMMUNITY): Payer: Self-pay

## 2017-03-01 DIAGNOSIS — S060X0A Concussion without loss of consciousness, initial encounter: Secondary | ICD-10-CM

## 2017-03-01 DIAGNOSIS — Y939 Activity, unspecified: Secondary | ICD-10-CM | POA: Insufficient documentation

## 2017-03-01 DIAGNOSIS — Y999 Unspecified external cause status: Secondary | ICD-10-CM | POA: Insufficient documentation

## 2017-03-01 DIAGNOSIS — F909 Attention-deficit hyperactivity disorder, unspecified type: Secondary | ICD-10-CM | POA: Insufficient documentation

## 2017-03-01 DIAGNOSIS — Y929 Unspecified place or not applicable: Secondary | ICD-10-CM | POA: Insufficient documentation

## 2017-03-01 DIAGNOSIS — Z79899 Other long term (current) drug therapy: Secondary | ICD-10-CM | POA: Insufficient documentation

## 2017-03-01 LAB — CBC
HCT: 35 % — ABNORMAL LOW (ref 36.0–46.0)
Hemoglobin: 11.5 g/dL — ABNORMAL LOW (ref 12.0–15.0)
MCH: 23.1 pg — ABNORMAL LOW (ref 26.0–34.0)
MCHC: 32.9 g/dL (ref 30.0–36.0)
MCV: 70.4 fL — AB (ref 78.0–100.0)
PLATELETS: 145 10*3/uL — AB (ref 150–400)
RBC: 4.97 MIL/uL (ref 3.87–5.11)
RDW: 16.7 % — AB (ref 11.5–15.5)
WBC: 7.1 10*3/uL (ref 4.0–10.5)

## 2017-03-01 LAB — URINALYSIS, ROUTINE W REFLEX MICROSCOPIC
BILIRUBIN URINE: NEGATIVE
GLUCOSE, UA: NEGATIVE mg/dL
HGB URINE DIPSTICK: NEGATIVE
Ketones, ur: 5 mg/dL — AB
NITRITE: NEGATIVE
PH: 7 (ref 5.0–8.0)
Protein, ur: NEGATIVE mg/dL
SPECIFIC GRAVITY, URINE: 1.014 (ref 1.005–1.030)

## 2017-03-01 LAB — COMPREHENSIVE METABOLIC PANEL
ALT: 10 U/L — AB (ref 14–54)
AST: 20 U/L (ref 15–41)
Albumin: 4 g/dL (ref 3.5–5.0)
Alkaline Phosphatase: 64 U/L (ref 38–126)
Anion gap: 10 (ref 5–15)
BILIRUBIN TOTAL: 0.4 mg/dL (ref 0.3–1.2)
BUN: 6 mg/dL (ref 6–20)
CHLORIDE: 104 mmol/L (ref 101–111)
CO2: 23 mmol/L (ref 22–32)
CREATININE: 0.78 mg/dL (ref 0.44–1.00)
Calcium: 8.8 mg/dL — ABNORMAL LOW (ref 8.9–10.3)
Glucose, Bld: 88 mg/dL (ref 65–99)
Potassium: 3.3 mmol/L — ABNORMAL LOW (ref 3.5–5.1)
Sodium: 137 mmol/L (ref 135–145)
TOTAL PROTEIN: 7 g/dL (ref 6.5–8.1)

## 2017-03-01 LAB — PREGNANCY, URINE: PREG TEST UR: NEGATIVE

## 2017-03-01 MED ORDER — METOCLOPRAMIDE HCL 10 MG PO TABS
10.0000 mg | ORAL_TABLET | Freq: Once | ORAL | Status: AC
  Administered 2017-03-01: 10 mg via ORAL
  Filled 2017-03-01: qty 1

## 2017-03-01 MED ORDER — IBUPROFEN 400 MG PO TABS
600.0000 mg | ORAL_TABLET | Freq: Once | ORAL | Status: AC
  Administered 2017-03-01: 600 mg via ORAL
  Filled 2017-03-01: qty 1

## 2017-03-01 MED ORDER — DIPHENHYDRAMINE HCL 25 MG PO CAPS
25.0000 mg | ORAL_CAPSULE | Freq: Once | ORAL | Status: AC
Start: 2017-03-01 — End: 2017-03-01
  Administered 2017-03-01: 25 mg via ORAL
  Filled 2017-03-01: qty 1

## 2017-03-01 NOTE — ED Triage Notes (Signed)
During triage pt stopped responding and had to be sternal rubbed to wake up to complete triage.
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
## 2017-03-07 ENCOUNTER — Ambulatory Visit (HOSPITAL_COMMUNITY)
Admission: EM | Admit: 2017-03-07 | Discharge: 2017-03-07 | Disposition: A | Discharge status: Home / Self Care | Payer: Medicaid Other | Attending: Internal Medicine | Admitting: Internal Medicine

## 2017-03-07 ENCOUNTER — Encounter (HOSPITAL_COMMUNITY): Payer: Self-pay | Admitting: Emergency Medicine

## 2017-03-07 ENCOUNTER — Ambulatory Visit (INDEPENDENT_AMBULATORY_CARE_PROVIDER_SITE_OTHER): Payer: Self-pay

## 2017-03-07 DIAGNOSIS — S86812A Strain of other muscle(s) and tendon(s) at lower leg level, left leg, initial encounter: Secondary | ICD-10-CM | POA: Diagnosis not present

## 2017-03-07 MED ORDER — IBUPROFEN 800 MG PO TABS: 800.0000 mg | ORAL_TABLET | Freq: Three times a day (TID) | ORAL | 0 refills | Status: AC

## 2017-03-07 MED ORDER — IBUPROFEN 800 MG PO TABS
800.0000 mg | ORAL_TABLET | Freq: Once | ORAL | Status: AC
  Administered 2017-03-07: 800 mg via ORAL

## 2017-03-07 MED ORDER — IBUPROFEN 800 MG PO TABS
ORAL_TABLET | ORAL | Status: AC
  Filled 2017-03-07: qty 1

## 2017-03-07 NOTE — ED Provider Notes (Signed)
CSN: [id number]     Arrival date & time 03/07/17  1600 History   First MD Initiated Contact with Patient 03/07/17 1733     Chief Complaint  Patient presents with  . Knee Pain   (Consider location/radiation/quality/duration/timing/severity/associated sxs/prior Treatment) Patient is a well-appearing 21 y.o. Female, work up this morning with a left knee pain that is 10/10. She reports that yesterday she pulled her knee up toward her body and might have over did it because she heard something "crunchy". Patient have not taken anything for pain. Pain is constant and is worse with certain movement. She denies swelling. She denies numbness or tingling sensation. She reports unable to bear weight and have been hopping on one foot.        Past Medical History:  Diagnosis Date  . ADHD   . Depression    History reviewed. No pertinent surgical history. No family history on file. Social History  Substance Use Topics  . Smoking status: Never Smoker  . Smokeless tobacco: Never Used  . Alcohol use No   OB History    No data available     Review of Systems  Constitutional:       As stated in the HPI    Allergies  Patient has no known allergies.  Home Medications   Prior to Admission medications   Medication Sig Start Date End Date Taking? Authorizing Provider  butalbital-acetaminophen-caffeine (FIORICET, ESGIC) 50-325-40 MG tablet Take 1-2 tablets by mouth every 6 (six) hours as needed for headache. 08/19/16 08/19/17  Fayrene Helperran, Bowie, PA-C  ciprofloxacin (CIPRO) 500 MG tablet Take 1 tablet (500 mg total) by mouth 2 (two) times daily. 12/01/16   Joy, Shawn C, PA-C  naproxen (NAPROSYN) 375 MG tablet Take 1 tablet (375 mg total) by mouth 2 (two) times daily. 11/11/16   Linwood DibblesKnapp, Jon, MD  naproxen (NAPROSYN) 500 MG tablet Take 1 tablet (500 mg total) by mouth 2 (two) times daily. 12/01/16   Joy, Hillard DankerShawn C, PA-C   Meds Ordered and Administered this Visit   Medications  ibuprofen (ADVIL,MOTRIN) tablet  800 mg (800 mg Oral Given 03/07/17 1743)    BP (!) 128/49 (BP Location: Right Arm)   Pulse 89   Temp 98.3 F (36.8 C) (Oral)   Resp 18   LMP 02/12/2017   SpO2 100%  No data found.   Physical Exam  Constitutional: She is oriented to person, place, and time. She appears well-developed and well-nourished.  Cardiovascular: Normal rate.   Pulmonary/Chest: Effort normal.  Musculoskeletal:  Knees are symmetrical. No deformity noted. Left knee is slightly swollen. Has tenderness on palpation inferior to the patella at the patella tendon. Has no pain at the patella. ROM is full. She is ambulatory but limping.  Neurological: She is alert and oriented to person, place, and time.  Skin: Skin is warm and dry.  Nursing note and vitals reviewed.   Urgent Care Course     Procedures (including critical care time)  Labs Review Labs Reviewed - No data to display  Imaging Review Dg Knee Complete 4 Views Left  Result Date: 03/07/2017 CLINICAL DATA:  Twisted knee, continued pain nonweightbearing EXAM: LEFT KNEE - COMPLETE 4+ VIEW COMPARISON:  None. FINDINGS: Medial deviation of the patella on the frontal view. No fracture. No large effusion. IMPRESSION: Appearance of medial deviation of patella on frontal view. No fracture. Electronically Signed   By: Jasmine PangKim  Fujinaga M.D.   On: 03/07/2017 18:01     MDM   1.  Strain of left patellar tendon, initial encounter    Xray shows medial deviation of her left patella but clinically her patella are symmetrical; both slightly medially deviated. No concern for dislocation. No deformity. Her left patella glides well and is in tract. She has reported tenderness under her patella at the location of the patella tendon.   Will treat conservatively for patella tendon strain with Ibuprofen 800 mg TID. Rest the extremity. Apply Knee Sleeve OTC.   F/u with PCP or Orthopedic specialist of no improvement by next week.       Lucia Estelle, NP 03/07/17 [phone]

## 2017-03-07 NOTE — Discharge Instructions (Signed)
Apply knee sleeve Take ibuprofen for swelling and pain Also apply ice Follow up with orthopedic specialist or your primary care doctor if you do not improve next week.

## 2017-03-07 NOTE — ED Triage Notes (Signed)
Patient was pulling ankle toward her body and heard a pop in left knee.  Today she is having pain in left knee

## 2017-04-22 ENCOUNTER — Ambulatory Visit: Payer: Self-pay | Admitting: Physical Therapy

## 2017-04-28 ENCOUNTER — Encounter: Payer: Self-pay | Admitting: Physical Therapy

## 2017-04-28 ENCOUNTER — Ambulatory Visit: Payer: Self-pay | Attending: Physician Assistant | Admitting: Physical Therapy

## 2017-04-28 DIAGNOSIS — M25662 Stiffness of left knee, not elsewhere classified: Secondary | ICD-10-CM | POA: Insufficient documentation

## 2017-04-28 DIAGNOSIS — R262 Difficulty in walking, not elsewhere classified: Secondary | ICD-10-CM | POA: Insufficient documentation

## 2017-04-28 DIAGNOSIS — M6281 Muscle weakness (generalized): Secondary | ICD-10-CM | POA: Insufficient documentation

## 2017-04-28 DIAGNOSIS — M25562 Pain in left knee: Secondary | ICD-10-CM | POA: Insufficient documentation

## 2017-04-29 ENCOUNTER — Encounter: Payer: Self-pay | Admitting: Physical Therapy

## 2017-04-29 NOTE — Therapy (Addendum)
[employer] [facility] [address] [place], [place], [address] Phone: [phone]   Fax:  [phone]  Physical Therapy Evaluation  Patient Details  Name: Cassidy Bruce MRN: [id number] Date of Birth: Apr 13, 1996 Referring Provider: Jodene NamStephen Chabon PA   Encounter Date: 04/28/2017      PT End of Session - 04/28/17 2135    Visit Number 1   Number of Visits 16   Date for PT Re-Evaluation 06/23/17   Authorization Type Self pay but is filling for CAFA    PT Start Time 1148   PT Stop Time 1240   PT Time Calculation (min) 52 min   Activity Tolerance Patient tolerated treatment well   Behavior During Therapy [facility] for tasks assessed/performed      Past Medical History:  Diagnosis Date  . ADHD   . Depression     History reviewed. No pertinent surgical history.  There were no vitals filed for this visit.       Subjective Assessment - 04/28/17 1154    Subjective Patient was stretching on 03/07/2017 when she flet a pull in her patella. X-rays showed no dislocation but the patient does have medial allignment of her patellas bilateral. She feels like her leg buckles when she walks. She has swelling of the right knee. At this time she is self pay.    Limitations Standing;Walking   How long can you sit comfortably? no limit    How long can you stand comfortably? < 10 minutes    How long can you walk comfortably? limited community distances.    Diagnostic tests X-rays: Medial palcement of patellas   Patient Stated Goals To have less pain when walking; to return to volleyball.    Currently in Pain? Yes   Pain Score 7    Pain Location Knee   Pain Orientation Left   Pain Descriptors / Indicators Aching   Pain Type Acute pain   Pain Radiating Towards sound the peatella    Pain Onset More than a month ago   Pain Frequency Constant   Aggravating Factors  standing and walking    Pain Relieving Factors rest    Effect of Pain on Daily Activities  difficulty perfroming ADL's             [facility] PT Assessment - 04/29/17 0001      Assessment   Medical Diagnosis Lef quad strain    Referring Provider Jodene NamStephen Chabon PA    Onset Date/Surgical Date 03/07/17   Hand Dominance Right   Next MD Visit July 18th 2018    Prior Therapy None      Precautions   Precautions None     Restrictions   Weight Bearing Restrictions No     Balance Screen   Has the patient fallen in the past 6 months No   Has the patient had a decrease in activity level because of a fear of falling?  No   Is the patient reluctant to leave their home because of a fear of falling?  No     Home Environment   Additional Comments Has stairs into the back door and front. The back steps give her a little pain      Prior Function   Level of Independence Independent   Vocation Full time employment   Vocation Requirements Works at [employer]. She is up on her feet a lot    Leisure [employer],      Cognition   Overall Cognitive Status Within  Functional Limits for tasks assessed   Attention Focused   Focused Attention Appears intact   Memory Appears intact   Awareness Appears intact   Problem Solving Appears intact     Observation/Other Assessments   Observations Wearing a hinged patellar support brace    Focus on Therapeutic Outcomes (FOTO)  61% limitation      Sensation   Light Touch Appears Intact   Additional Comments denies parathesias      Coordination   Gross Motor Movements are Fluid and Coordinated Yes   Fine Motor Movements are Fluid and Coordinated Yes     Posture/Postural Control   Posture Comments rounded shoulders      ROM / Strength   AROM / PROM / Strength AROM;PROM;Strength     AROM   Overall AROM Comments pain with active motion    AROM Assessment Site Knee   Right/Left Knee Left   Left Knee Flexion 80     PROM   PROM Assessment Site Knee   Right/Left Knee Left   Left Knee Extension 0   Left Knee Flexion 92     Strength    Strength Assessment Site Hip;Knee;Ankle   Right/Left Hip Left   Left Hip Flexion 3+/5   Left Hip ABduction 3+/5   Right/Left Knee Left   Left Knee Flexion 3+/5   Left Knee Extension 3+/5     Palpation   Patella mobility Paine with lateral patellar movement    Palpation comment Tenderness to palapation  around the medial and lateral patella      Special Tests    Special Tests --  patellar compression (+)     Ambulation/Gait   Gait Comments hyper extension with gait; Decreased left single leg stance time.             Objective measurements completed on examination: See above findings.          [facility] Adult PT Treatment/Exercise - 04/29/17 0001      Knee/Hip Exercises: Standing   Heel Raises Limitations x20   Other Standing Knee Exercises reviewed higher level standing exercises in case patient can not come back 2nd to private pay     Knee/Hip Exercises: Supine   Quad Sets Limitations x10 5 sec holds    Short Arc Quad Sets Limitations x20    Heel Slides Limitations with strap x5 10 sec hold    Straight Leg Raises Limitations x10     Knee/Hip Exercises: Sidelying   Hip ABduction Limitations x10     Knee/Hip Exercises: Prone   Hip Extension Limitations x10                 PT Education - 04/28/17 2135    Education provided Yes   Education Details symptom management; importance of building stability; taping techniuqe and wear time;     Person(s) Educated Patient   Methods Explanation;Demonstration;Tactile cues;Verbal cues   Comprehension Verbalized understanding;Returned demonstration;Verbal cues required;Tactile cues required;Need further instruction          PT Short Term Goals - 04/28/17 2143      PT SHORT TERM GOAL #1   Title Patient will demsotrate full pain free left knee PROM    Time 4   Period Weeks   Status New     PT SHORT TERM GOAL #2   Title Patient will demontrate a 15 second single leg stance time on the left    Time 4   Period  Weeks   Status New  PT SHORT TERM GOAL #3   Title Patient will report 3/10 pain on the right    Time 4   Period Weeks   Status New     PT SHORT TERM GOAL #4   Title Patient will ambualte 300' without hyper extnesion of the left leg    Time 4   Period Weeks   Status New           PT Long Term Goals - 04/28/17 2144      PT LONG TERM GOAL #1   Title Patient will ambualte 3000' without increased pain in order to perfrom IADL's    Time 8   Period Weeks   Status New     PT LONG TERM GOAL #2   Title Patient will go up/down 8 steps with no significant increase in pain    Time 8   Period Weeks   Status New     PT LONG TERM GOAL #3   Title Patient will return to light jogging without increased pain    Time 8   Period Weeks   Status New                Plan - 04/28/17 2137    Clinical Impression Statement Patient is a 21 year old female S/P quadtendon strain. She presents with significant instability with gait. He knee hyper extends when she walks. She has pain when her patella is moved laterally. Therapy attempted patella stabilization taping but it had little effect. She has significant weakness of her left quad and moderate weakness of her left hip. She is self pay and may only be able to attend limited visits. She is filling for CAFA. She would benefit from skilled therapy for quad strengthening and gait training.    Clinical Presentation Evolving   Clinical Presentation due to: significant instability with gait    Clinical Decision Making Low   Rehab Potential Good   PT Frequency 2x / week   PT Duration 8 weeks   PT Treatment/Interventions ADLs/Self Care Home Management;Cryotherapy;Electrical Stimulation;Iontophoresis 4mg /ml Dexamethasone;Stair training;Gait training;Moist Heat;Traction;Therapeutic activities;Therapeutic exercise;Neuromuscular re-education;Patient/family education;Passive range of motion;Manual techniques;Splinting;Taping;Ultrasound   PT Next  Visit Plan depending on the number of cisits the patient can attend she will need quad stability exercises; review SLR 3 way; quad sets; TKE; heel slides; bridges all as tolerated. taping did not help this visit. Her patellas sit medially but her right is painful when moved laterally.    PT Home Exercise Plan quad set; heel slide with strap; SLR 3 way; heel raise standing; also reviewed standing exercises if the patient can not come back.    Consulted and Agree with Plan of Care Patient      Patient will benefit from skilled therapeutic intervention in order to improve the following deficits and impairments:  Abnormal gait, Decreased mobility, Decreased strength, Impaired sensation, Pain, Difficulty walking, Decreased activity tolerance  Visit Diagnosis: Acute pain of left knee - Plan: PT plan of care cert/re-cert  Stiffness of left knee, not elsewhere classified - Plan: PT plan of care cert/re-cert  Difficulty in walking, not elsewhere classified - Plan: PT plan of care cert/re-cert  Muscle weakness (generalized) - Plan: PT plan of care cert/re-cert     Problem List There are no active problems to display for this patient.   Dessie Coma PT DPT  04/29/2017, 12:04 PM  [facility] [address] [place], [place], [address] Phone: [phone]   Fax:  [phone]  Name: Cassidy Bruce MRN: [id number] Date of Birth: 05-06-1996

## 2017-05-12 ENCOUNTER — Ambulatory Visit: Payer: Self-pay | Admitting: Physical Therapy

## 2017-05-19 ENCOUNTER — Ambulatory Visit: Payer: Self-pay | Attending: Physician Assistant | Admitting: Physical Therapy

## 2017-05-19 DIAGNOSIS — M25662 Stiffness of left knee, not elsewhere classified: Secondary | ICD-10-CM | POA: Insufficient documentation

## 2017-05-19 DIAGNOSIS — R262 Difficulty in walking, not elsewhere classified: Secondary | ICD-10-CM | POA: Insufficient documentation

## 2017-05-19 DIAGNOSIS — M25562 Pain in left knee: Secondary | ICD-10-CM | POA: Insufficient documentation

## 2017-05-19 DIAGNOSIS — M6281 Muscle weakness (generalized): Secondary | ICD-10-CM | POA: Insufficient documentation

## 2017-05-26 ENCOUNTER — Ambulatory Visit: Payer: Self-pay | Admitting: Physical Therapy

## 2017-05-26 ENCOUNTER — Encounter: Payer: Self-pay | Admitting: Physical Therapy

## 2017-05-26 DIAGNOSIS — R262 Difficulty in walking, not elsewhere classified: Secondary | ICD-10-CM

## 2017-05-26 DIAGNOSIS — M6281 Muscle weakness (generalized): Secondary | ICD-10-CM

## 2017-05-26 DIAGNOSIS — M25662 Stiffness of left knee, not elsewhere classified: Secondary | ICD-10-CM

## 2017-05-26 DIAGNOSIS — M25562 Pain in left knee: Secondary | ICD-10-CM

## 2017-05-27 NOTE — Therapy (Signed)
[facility] [address] [place], [place], [address] Phone: [phone]   Fax:  [phone]  Physical Therapy Treatment  Patient Details  Name: Cassidy Bruce MRN: [id number] Date of Birth: [birth year] Referring Provider: Jodene NamStephen Chabon PA   Encounter Date: 05/26/2017      PT End of Session - 05/26/17 1110    Visit Number 2   Number of Visits 16   Date for PT Re-Evaluation 06/23/17   Authorization Type Self pay but is filling for CAFA    PT Start Time 1100   PT Stop Time 1151   PT Time Calculation (min) 51 min   Activity Tolerance Patient tolerated treatment well   Behavior During Therapy [facility] for tasks assessed/performed      Past Medical History:  Diagnosis Date  . ADHD   . Depression     History reviewed. No pertinent surgical history.  There were no vitals filed for this visit.      Subjective Assessment - 05/26/17 1103    Subjective Patient reports her knee is doing better. She continues to have some sharp pains at times but overall she feels like her knee is improving. She is back to running. She    Limitations Standing;Walking   How long can you sit comfortably? no limit    How long can you stand comfortably? < 10 minutes    How long can you walk comfortably? limited community distances.    Diagnostic tests X-rays: Medial palcement of patellas   Patient Stated Goals To have less pain when walking; to return to volleyball.    Currently in Pain? No/denies                         [facility]/Exercise - 05/27/17 0001      Knee/Hip Exercises: Stretches   Passive Hamstring Stretch Limitations strap 2x30sec    Quad Stretch Limitations strap 2x30sec   ITB Stretch Limitations strap 2x30sec      Knee/Hip Exercises: Standing   Heel Raises Limitations x20   SLS cone drill with straight leg 2x10      Knee/Hip Exercises: Seated   Long Arc Quad Limitations red 2x10    Heel Slides  Limitations seated knee flexion red 2x10      Knee/Hip Exercises: Supine   Bridges Limitations 2x10   Straight Leg Raises Limitations 2x10     Knee/Hip Exercises: Sidelying   Hip ABduction Limitations 2x10     Knee/Hip Exercises: Prone   Hip Extension Limitations 2x10      Modalities   Modalities Cryotherapy     Cryotherapy   Number Minutes Cryotherapy 10 Minutes   Cryotherapy Location Knee   Type of Cryotherapy Ice pack                PT Education - 05/26/17 1108    Education provided Yes   Education Details symptom mangement, improtance of bulding stability and strength    Person(s) Educated Patient   Methods Explanation;Demonstration;Tactile cues;Verbal cues   Comprehension Verbalized understanding;Returned demonstration;Verbal cues required;Tactile cues required          PT Short Term Goals - 05/27/17 0754      PT SHORT TERM GOAL #1   Title Patient will demsotrate full pain free left knee PROM    Time 4   Period Weeks   Status On-going     PT SHORT TERM GOAL #2   Title Patient will demontrate a  15 second single leg stance time on the left    Time 4   Period Weeks   Status On-going     PT SHORT TERM GOAL #3   Title Patient will report 3/10 pain on the right    Time 4   Period Weeks   Status On-going     PT SHORT TERM GOAL #4   Title Patient will ambualte 300' without hyper extnesion of the left leg    Time 4   Period Weeks   Status On-going           PT Long Term Goals - 04/28/17 2144      PT LONG TERM GOAL #1   Title Patient will ambualte 3000' without increased pain in order to perfrom IADL's    Time 8   Period Weeks   Status New     PT LONG TERM GOAL #2   Title Patient will go up/down 8 steps with no significant increase in pain    Time 8   Period Weeks   Status New     PT LONG TERM GOAL #3   Title Patient will return to light jogging without increased pain    Time 8   Period Weeks   Status New                Plan - 05/26/17 1110    Clinical Impression Statement Patient tolerated treatment well. therapy added rested band exercise and increased the difficulty of her single leg stance activity. She tolerated treatment well. Therapy also reviewed stretching with the aptient    Clinical Presentation Evolving   Clinical Presentation due to: instability with gait but has improved    Clinical Decision Making Low   Rehab Potential Good   PT Frequency 2x / week   PT Duration 8 weeks   PT Treatment/Interventions ADLs/Self Care Home Management;Cryotherapy;Electrical Stimulation;Iontophoresis 4mg /ml Dexamethasone;Stair training;Gait training;Moist Heat;Traction;Therapeutic activities;Therapeutic exercise;Neuromuscular re-education;Patient/family education;Passive range of motion;Manual techniques;Splinting;Taping;Ultrasound   PT Next Visit Plan depending on the number of cisits the patient can attend she will need quad stability exercises; review SLR 3 way; quad sets; TKE; heel slides; bridges all as tolerated. taping did not help this visit. Her patellas sit medially but her right is painful when moved laterally.    PT Home Exercise Plan quad set; heel slide with strap; SLR 3 way; heel raise standing; also reviewed standing exercises if the patient can not come back.    Consulted and Agree with Plan of Care Patient      Patient will benefit from skilled therapeutic intervention in order to improve the following deficits and impairments:  Abnormal gait, Decreased mobility, Decreased strength, Impaired sensation, Pain, Difficulty walking, Decreased activity tolerance  Visit Diagnosis: Acute pain of left knee  Stiffness of left knee, not elsewhere classified  Difficulty in walking, not elsewhere classified  Muscle weakness (generalized)     Problem List There are no active problems to display for this patient.   Dessie Comaavid J Mollie Rossano PT DPT  05/27/2017, 7:55 AM  [facility] [address] [place], [place], [address] Phone: [phone]   Fax:  [phone]  Name: Cassidy Bruce MRN: [id number] Date of Birth: 1996-09-14

## 2017-06-02 ENCOUNTER — Encounter: Payer: Self-pay | Admitting: Physical Therapy

## 2017-06-09 ENCOUNTER — Ambulatory Visit: Payer: Self-pay | Attending: Physician Assistant | Admitting: Physical Therapy

## 2017-06-09 ENCOUNTER — Encounter: Payer: Self-pay | Admitting: Physical Therapy

## 2017-06-09 DIAGNOSIS — R262 Difficulty in walking, not elsewhere classified: Secondary | ICD-10-CM | POA: Insufficient documentation

## 2017-06-09 DIAGNOSIS — M6281 Muscle weakness (generalized): Secondary | ICD-10-CM | POA: Insufficient documentation

## 2017-06-09 DIAGNOSIS — M25562 Pain in left knee: Secondary | ICD-10-CM | POA: Insufficient documentation

## 2017-06-09 DIAGNOSIS — M25662 Stiffness of left knee, not elsewhere classified: Secondary | ICD-10-CM | POA: Insufficient documentation

## 2017-06-10 ENCOUNTER — Encounter: Payer: Self-pay | Admitting: Physical Therapy

## 2017-06-10 NOTE — Therapy (Addendum)
[place], [place], [address] Phone: [phone]   Fax:  [phone]  Physical Therapy Treatment/ Discharge  Patient Details  Name: Cassidy Bruce MRN: [id number] Date of Birth: October 08, 1996 Referring Provider: Gerrit Halls PA   Encounter Date: 06/09/2017      PT End of Session - 06/09/17 1107    Visit Number 3   Number of Visits 16   Date for PT Re-Evaluation 06/23/17   Authorization Type Self pay but is filling for CAFA    PT Start Time 1102   PT Stop Time 1143   PT Time Calculation (min) 41 min   Activity Tolerance Patient tolerated treatment well   Behavior During Therapy [facility] for tasks assessed/performed      Past Medical History:  Diagnosis Date  . ADHD   . Depression     History reviewed. No pertinent surgical history.  There were no vitals filed for this visit.      Subjective Assessment - 06/09/17 1105    Subjective Patient reports last week she had a shapr pain in her left knee. She otherwise has been doing well. She has been doing her exercises and stretches.    Limitations Standing;Walking   How long can you sit comfortably? no limit    How long can you stand comfortably? < 10 minutes    How long can you walk comfortably? limited community distances.    Diagnostic tests X-rays: Medial palcement of patellas   Patient Stated Goals To have less pain when walking; to return to volleyball.    Currently in Pain? No/denies                                 PT Education - 06/09/17 1106    Education provided Yes   Education Details reviewed HEP;  continue with strengthening exercises.    Person(s) Educated Patient   Methods Explanation;Demonstration;Tactile cues;Verbal cues   Comprehension Verbalized understanding;Returned demonstration;Verbal cues required;Tactile cues required          PT Short Term Goals - 06/10/17 1044      PT SHORT TERM GOAL #1    Title Patient will demsotrate full pain free left knee PROM    Time 4   Period Weeks   Status On-going     PT SHORT TERM GOAL #2   Title Patient will demontrate a 15 second single leg stance time on the left    Time 4   Period Weeks   Status On-going     PT SHORT TERM GOAL #3   Title Patient will report 3/10 pain on the right    Time 4   Period Weeks   Status On-going     PT SHORT TERM GOAL #4   Title Patient will ambualte 300' without hyper extnesion of the left leg    Time 4   Period Weeks   Status On-going           PT Long Term Goals - 04/28/17 2144      PT LONG TERM GOAL #1   Title Patient will ambualte 3000' without increased pain in order to perfrom IADL's    Time 8   Period Weeks   Status New     PT LONG TERM GOAL #2   Title Patient will go up/down 8 steps with no significant increase in pain    Time 8   Period Weeks  Status New     PT LONG TERM GOAL #3   Title Patient will return to light jogging without increased pain    Time 8   Period Weeks   Status New               Plan - 06/10/17 1003    Clinical Impression Statement At this point the patient is making good progress. Therapy gave the patient higher level exercises to work on. She has reached her goals for FOTO. She is back running and back to the gym. The patient feels comfortable with her HEP. She continues to have shooting pains at times. She will likely D/C to HEP.    Clinical Presentation Evolving   Clinical Presentation due to: instability with gait but has improved    Clinical Decision Making Low   Rehab Potential Good   PT Frequency 2x / week   PT Duration 8 weeks   PT Treatment/Interventions ADLs/Self Care Home Management;Cryotherapy;Electrical Stimulation;Iontophoresis '4mg'$ /ml Dexamethasone;Stair training;Gait training;Moist Heat;Traction;Therapeutic activities;Therapeutic exercise;Neuromuscular re-education;Patient/family education;Passive range of motion;Manual  techniques;Splinting;Taping;Ultrasound   PT Next Visit Plan depending on the number of cisits the patient can attend she will need quad stability exercises; review SLR 3 way; quad sets; TKE; heel slides; bridges all as tolerated. taping did not help this visit. Her patellas sit medially but her right is painful when moved laterally.    PT Home Exercise Plan quad set; heel slide with strap; SLR 3 way; heel raise standing; also reviewed standing exercises if the patient can not come back.    Consulted and Agree with Plan of Care Patient      Patient will benefit from skilled therapeutic intervention in order to improve the following deficits and impairments:  Abnormal gait, Decreased mobility, Decreased strength, Impaired sensation, Pain, Difficulty walking, Decreased activity tolerance  Visit Diagnosis: Acute pain of left knee  Stiffness of left knee, not elsewhere classified  Difficulty in walking, not elsewhere classified  Muscle weakness (generalized)  PHYSICAL THERAPY DISCHARGE SUMMARY  Visits from Start of Care: 3  Current functional level related to goals / functional outcomes: Back to running and going to the gym    Remaining deficits: Minor pain at times    Education / Equipment: HEP  Plan: Patient agrees to discharge.  Patient goals were not met. Patient is being discharged due to meeting the stated rehab goals.  ?????       Problem List There are no active problems to display for this patient.   Carney Living  PT DPT  06/10/2017, 10:49 AM  [facility] [address] [place], [place], [address] Phone: [phone]   Fax:  [phone]  Name: Layann Bluett MRN: [id number] Date of Birth: 1996-11-01

## 2017-09-17 ENCOUNTER — Encounter (HOSPITAL_COMMUNITY): Payer: Self-pay | Admitting: Emergency Medicine

## 2017-09-17 ENCOUNTER — Other Ambulatory Visit: Payer: Self-pay

## 2017-09-17 ENCOUNTER — Emergency Department (HOSPITAL_COMMUNITY)
Admission: EM | Admit: 2017-09-17 | Discharge: 2017-09-18 | Disposition: A | Discharge status: Home / Self Care | Payer: Self-pay | Attending: Emergency Medicine | Admitting: Emergency Medicine

## 2017-09-17 DIAGNOSIS — F331 Major depressive disorder, recurrent, moderate: Secondary | ICD-10-CM

## 2017-09-17 DIAGNOSIS — F329 Major depressive disorder, single episode, unspecified: Secondary | ICD-10-CM | POA: Insufficient documentation

## 2017-09-17 DIAGNOSIS — R45851 Suicidal ideations: Secondary | ICD-10-CM | POA: Insufficient documentation

## 2017-09-17 LAB — COMPREHENSIVE METABOLIC PANEL
ALK PHOS: 64 U/L (ref 38–126)
ALT: 14 U/L (ref 14–54)
AST: 24 U/L (ref 15–41)
Albumin: 4.1 g/dL (ref 3.5–5.0)
Anion gap: 7 (ref 5–15)
BUN: 10 mg/dL (ref 6–20)
CALCIUM: 9.1 mg/dL (ref 8.9–10.3)
CO2: 25 mmol/L (ref 22–32)
CREATININE: 0.66 mg/dL (ref 0.44–1.00)
Chloride: 107 mmol/L (ref 101–111)
Glucose, Bld: 96 mg/dL (ref 65–99)
Potassium: 3.5 mmol/L (ref 3.5–5.1)
SODIUM: 139 mmol/L (ref 135–145)
Total Bilirubin: 0.4 mg/dL (ref 0.3–1.2)
Total Protein: 7.3 g/dL (ref 6.5–8.1)

## 2017-09-17 LAB — RAPID URINE DRUG SCREEN, HOSP PERFORMED
AMPHETAMINES: NOT DETECTED
Barbiturates: NOT DETECTED
Benzodiazepines: NOT DETECTED
Cocaine: NOT DETECTED
OPIATES: NOT DETECTED
TETRAHYDROCANNABINOL: NOT DETECTED

## 2017-09-17 LAB — CBC
HCT: 34.6 % — ABNORMAL LOW (ref 36.0–46.0)
HEMOGLOBIN: 11.6 g/dL — AB (ref 12.0–15.0)
MCH: 24.1 pg — AB (ref 26.0–34.0)
MCHC: 33.5 g/dL (ref 30.0–36.0)
MCV: 71.9 fL — ABNORMAL LOW (ref 78.0–100.0)
PLATELETS: 148 10*3/uL — AB (ref 150–400)
RBC: 4.81 MIL/uL (ref 3.87–5.11)
RDW: 15.6 % — ABNORMAL HIGH (ref 11.5–15.5)
WBC: 8.1 10*3/uL (ref 4.0–10.5)

## 2017-09-17 LAB — I-STAT BETA HCG BLOOD, ED (MC, WL, AP ONLY): I-stat hCG, quantitative: <5 m[IU]/mL (ref <5)

## 2017-09-17 NOTE — ED Triage Notes (Signed)
Pt brought in by EMS for c/o panic attack and suicidal ideations   Pt states she has had added stress in her life for the past three months   Pt states it was worse tonight and she had a panic attack and called EMS   Pt states she has had thoughts of SI but has not attempted anything

## 2017-09-17 NOTE — ED Notes (Signed)
ED Provider at bedside. 

## 2017-09-18 LAB — ACETAMINOPHEN LEVEL: Acetaminophen (Tylenol), Serum: <10 ug/mL — ABNORMAL LOW (ref 10–30)

## 2017-09-18 LAB — SALICYLATE LEVEL

## 2017-09-18 LAB — ETHANOL: Alcohol, Ethyl (B): <10 mg/dL (ref <10)

## 2017-09-18 MED ORDER — POTASSIUM CHLORIDE CRYS ER 20 MEQ PO TBCR
40.0000 meq | EXTENDED_RELEASE_TABLET | Freq: Once | ORAL | Status: AC
  Administered 2017-09-18: 40 meq via ORAL
  Filled 2017-09-18: qty 2

## 2017-09-18 NOTE — ED Provider Notes (Signed)
[facility]-EMERGENCY DEPT Provider Note   CSN: [id number] Arrival date & time: 09/17/17  2221    History   Chief Complaint Chief Complaint  Patient presents with  . Suicidal    HPI Cassidy Bruce is a 21 y.o. female.  21 year old female presents to the emergency department for psychiatric evaluation.  Patient with increased frustration perpetuating a panic attack prior to arrival.  Patient states that she was having thoughts of harming herself, but does not believe she would kill herself if she left the emergency department.  She denies history of suicide attempt.  She reports increased stress over the past 3 months since engaging in a relationship with an older female.  She states that she just found out that he had a girlfriend and she feels very confused and conflicted.  She states that she saw a therapist or psychiatrist when she was younger, but has not seen one recently.  She is not on psychiatric medications.  She does not believe that she needs to be hospitalized for her symptoms today.      Past Medical History:  Diagnosis Date  . ADHD   . Depression     There are no active problems to display for this patient.   History reviewed. No pertinent surgical history.  OB History    No data available       Home Medications    Prior to Admission medications   Not on File    Family History Family History  Problem Relation Age of Onset  . Cancer Other   . Diabetes Other   . CAD Other     Social History Social History   Tobacco Use  . Smoking status: Never Smoker  . Smokeless tobacco: Never Used  Substance Use Topics  . Alcohol use: No  . Drug use: No     Allergies   Patient has no known allergies.   Review of Systems Review of Systems Ten systems reviewed and are negative for acute change, except as noted in the HPI.    Physical Exam Updated Vital Signs BP 127/78 (BP Location: Left Arm)   Pulse 72   Temp 98.3 F (36.8  C) (Oral)   Resp 18   Wt 52.2 kg (115 lb)   LMP 09/03/2017 (Exact Date)   SpO2 100%   BMI 19.74 kg/m   Physical Exam  Constitutional: She is oriented to person, place, and time. She appears well-developed and well-nourished. No distress.  Nontoxic and in NAD  HENT:  Head: Normocephalic and atraumatic.  Eyes: Conjunctivae and EOM are normal. No scleral icterus.  Neck: Normal range of motion.  Pulmonary/Chest: Effort normal. No respiratory distress.  Respirations even and unlabored  Musculoskeletal: Normal range of motion.  Neurological: She is alert and oriented to person, place, and time. She exhibits normal muscle tone. Coordination normal.  GCS 15. Moving all extremities.  Skin: Skin is warm and dry. No rash noted. She is not diaphoretic. No erythema. No pallor.  Psychiatric: She has a normal mood and affect. Her behavior is normal.  No SI; mild depression  Nursing note and vitals reviewed.    ED Treatments / Results  Labs (all labs ordered are listed, but only abnormal results are displayed) Labs Reviewed  ACETAMINOPHEN LEVEL - Abnormal; Notable for the following components:      Result Value   Acetaminophen (Tylenol), Serum <10 (*)    All other components within normal limits  CBC - Abnormal; Notable for the  following components:   Hemoglobin 11.6 (*)    HCT 34.6 (*)    MCV 71.9 (*)    MCH 24.1 (*)    RDW 15.6 (*)    Platelets 148 (*)    All other components within normal limits  COMPREHENSIVE METABOLIC PANEL  ETHANOL  SALICYLATE LEVEL  RAPID URINE DRUG SCREEN, HOSP PERFORMED  I-STAT BETA HCG BLOOD, ED ([facility], [facility], [facility] ONLY)    EKG  EKG Interpretation None       Radiology No results found.  Procedures Procedures (including critical care time)  Medications Ordered in ED Medications  potassium chloride SA (K-DUR,KLOR-CON) CR tablet 40 mEq (40 mEq Oral Given 09/18/17 0211)     Initial Impression / Assessment and Plan / ED Course  I have reviewed the  triage vital signs and the nursing notes.  Pertinent labs & imaging results that were available during my care of the patient were reviewed by me and considered in my medical decision making (see chart for details).     21 year old female presents for psychiatric evaluation.  She has been medically cleared and evaluated by TTS recommend psychiatric evaluation in the morning.  Disposition to be determined by oncoming ED provider.   Final Clinical Impressions(s) / ED Diagnoses   Final diagnoses:  Moderate episode of recurrent major depressive disorder [facility](HCC)    ED Discharge Orders    None       Antony MaduraHumes, Diora Bellizzi, PA-C 09/18/17 [id number]    Doug SouJacubowitz, Sam, MD 09/18/17 1705
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
## 2017-09-18 NOTE — ED Notes (Signed)
ED Provider at bedside.EDP J KNAPP PRESENT TO SPEAK WITH PT

## 2017-09-18 NOTE — Discharge Instructions (Signed)
Please return to the ED if you have any recurrent symptoms.  Please consider seeing an outpatient counselor as we discussed.

## 2017-09-18 NOTE — ED Provider Notes (Signed)
Patient presented to the emergency room for evaluation of depression and brief suicidal ideation.  Patient was assessed by the psychiatry team yesterday.  They did not feel that she required admission but they wanted to be reassessed in the morning.  Patient is asking to go home at this point.  She denies any suicidal ideation.  She denies feeling depressed right now.  Patient feels like her symptoms were related to the stressful situation yesterday.  She contracts for safety.  I did not think she warrants being held involuntarily at this point.  She states she will return immediately if she has any thoughts.  I will also provide her outpatient resource guides.   Linwood DibblesKnapp, Sejla Marzano, MD 09/18/17 [id number]

## 2017-09-18 NOTE — ED Notes (Signed)
DENIES SI AND HI AND WILL COME BACK. EDP KNAPP TO SEE PT OKAY FOR DISCHARGE

## 2017-09-18 NOTE — BH Assessment (Addendum)
Tele Assessment Note   Patient Name: Cassidy Bruce MRN: [id number] Referring Physician: Antony MaduraKelly Humes, PA. Location of Patient: [facility] Location of Provider: Behavioral Health TTS Department  Cassidy Bruce is an 21 y.o. female, who [facility] Clinician asked the pt, "what brought you to the hospital?" Pt reported, she had a panic attack and passed out from the stress of her relationship. Pt reported, he was not passed out long. Clinician attempted to gather additional information about the stressors of her relationship. Pt was vague when discussing her relationship. Pt would begin talking then suddenly stop. Pt reported, 2-3 months ago she had a seizure due to increased stress from her "complicated relationship." Pt reported, her siezure was random. Pt reported, that was her first seizure. Pt reported, earlier in the day she was suicidal and she wanted to walk in front of a car. Pt denies, current SI, HI, AVH, self-injurious behaviors and access to weapons.   Pt denies abuse and substance use. Pt's UDS was negative. Pt denies, being linked to OPT resources (medication management and/or counseling.) Pt denies, previous inpatient admissions.   Pt presents quiet/awake in scrubs with logical/coherent speech. Pt's eye contact was good. Pt's mood was depressed. Pt's affect was flat. Pt's thought process was coherent/relevant. Pt's judgement was unimpaired. Pt's concentration was normal. Pt's insight and impulse control are fair. Pt was oriented x4 (day, year, city and state.) Pt reported, [facility] she could contract for safety. Pt reported, if inpatient treatment was recommended she is "not sure," if she would sign-in voluntarily.   Diagnosis: F33.1 Major Depressive Disorder, Recurrent episode, Moderate with out Psychotic Features.                    F41.1 Generalized Anxiety Disorder.  Past Medical History:  Past Medical History:  Diagnosis Date  . ADHD    . Depression     History reviewed. No pertinent surgical history.  Family History:  Family History  Problem Relation Age of Onset  . Cancer Other   . Diabetes Other   . CAD Other     Social History:  reports that  has never smoked. she has never used smokeless tobacco. She reports that she does not drink alcohol or use drugs.  Additional Social History:  Alcohol / Drug Use Pain Medications: See MAR Prescriptions: See MAR Over the Counter: See MAR History of alcohol / drug use?: No history of alcohol / drug abuse(Pt denies. Pt's UDS is negative. )  CIWA: CIWA-Ar BP: 127/78 Pulse Rate: 72 COWS:    PATIENT STRENGTHS: (choose at least two) Average or above average intelligence General fund of knowledge  Allergies: No Known Allergies  Home Medications:  (Not in a hospital admission)  OB/GYN Status:  Patient's last menstrual period was 09/03/2017 (exact date).  General Assessment Data Location of Assessment: [facility] TTS Assessment: In system Is this a Tele or Face-to-Face Assessment?: Tele Assessment Is this an Initial Assessment or a Re-assessment for this encounter?: Initial Assessment Living Arrangements: Other (Comment)(roommate.) Can pt return to current living arrangement?: Yes Admission Status: Voluntary Is patient capable of signing voluntary admission?: Yes Referral Source: Self/Family/Friend Insurance type: Self-pay     Crisis Care Plan Living Arrangements: Other (Comment)(roommate.) Legal Guardian: Other:(Self) Name of Psychiatrist: NA Name of Therapist: NA  Education Status Is patient currently in school?: No Current Grade: NA Highest grade of school patient has completed: Pt reported, two years of college.  Name of school:  NA Contact person: NA  Risk to self with the past 6 months Suicidal Ideation: No-Not Currently/Within Last 6 Months Has patient been a risk to self within the past 6 months prior to admission? : Yes Suicidal Intent: No-Not  Currently/Within Last 6 Months Has patient had any suicidal intent within the past 6 months prior to admission? : Yes Is patient at risk for suicide?: Yes Suicidal Plan?: No-Not Currently/Within Last 6 Months Has patient had any suicidal plan within the past 6 months prior to admission? : Yes Access to Means: Yes Specify Access to Suicidal Means: Pt reported, walking in front of a car.  What has been your use of drugs/alcohol within the last 12 months?: Pt denies. UDS is negative.  Previous Attempts/Gestures: No How many times?: 0 Other Self Harm Risks: Pt denies.  Triggers for Past Attempts: None known Intentional Self Injurious Behavior: None(Pt denies. ) Family Suicide History: No Recent stressful life event(s): Conflict (Comment)(complicated relationship. ) Persecutory voices/beliefs?: No Depression: Yes Depression Symptoms: Feeling angry/irritable, Feeling worthless/self pity, Loss of interest in usual pleasures, Fatigue, Isolating, Tearfulness, Guilt Substance abuse history and/or treatment for substance abuse?: No Suicide prevention information given to non-admitted patients: Not applicable(Pt denies. )  Risk to Others within the past 6 months Homicidal Ideation: No Does patient have any lifetime risk of violence toward others beyond the six months prior to admission? : No Thoughts of Harm to Others: No Current Homicidal Intent: No Current Homicidal Plan: No Access to Homicidal Means: No Identified Victim: NA History of harm to others?: No Assessment of Violence: None Noted Violent Behavior Description: NA Does patient have access to weapons?: No(Pt denies. ) Criminal Charges Pending?: No Does patient have a court date: No Is patient on probation?: No  Psychosis Hallucinations: None noted Delusions: None noted  Mental Status Report Appearance/Hygiene: In scrubs Eye Contact: Good Motor Activity: Unremarkable Speech: Logical/coherent Level of Consciousness:  Quiet/awake Mood: Depressed Affect: Flat Anxiety Level: Panic Attacks Panic attack frequency: Pt reported, having a panic attack, today.  Most recent panic attack: Pt reported, having a panic attack , today.  Thought Processes: Coherent, Relevant Judgement: Unimpaired Orientation: Other (Comment)(day, year. city and state.) Obsessive Compulsive Thoughts/Behaviors: None  Cognitive Functioning Concentration: Normal Memory: Recent Intact IQ: Average Insight: Fair Impulse Control: Fair Appetite: Poor Weight Loss: (Pt reported, not eating. ) Sleep: Decreased Total Hours of Sleep: (Pt reported, sleeping 5-6 hours. ) Vegetative Symptoms: (not eating. )  ADLScreening [facility] Assessment Services) Patient's cognitive ability adequate to safely complete daily activities?: Yes Patient able to express need for assistance with ADLs?: Yes Independently performs ADLs?: Yes (appropriate for developmental age)  Prior Inpatient Therapy Prior Inpatient Therapy: No Prior Therapy Dates: NA Prior Therapy Facilty/Provider(s): NA Reason for Treatment: NA  Prior Outpatient Therapy Prior Outpatient Therapy: No Prior Therapy Dates: NA Prior Therapy Facilty/Provider(s): NA Reason for Treatment: NA Does patient have an ACCT team?: No Does patient have Intensive In-House Services?  : No Does patient have [facility] services? : No Does patient have P4CC services?: No  ADL Screening (condition at time of admission) Patient's cognitive ability adequate to safely complete daily activities?: Yes Is the patient deaf or have difficulty hearing?: No Does the patient have difficulty seeing, even when wearing glasses/contacts?: Yes(Pt wears glasses. ) Patient able to express need for assistance with ADLs?: Yes Does the patient have difficulty dressing or bathing?: No Independently performs ADLs?: Yes (appropriate for developmental age) Does the patient have difficulty walking or climbing stairs?: No Weakness  of  Legs: None Weakness of Arms/Hands: None  Home Assistive Devices/Equipment Home Assistive Devices/Equipment: Eyeglasses    Abuse/Neglect Assessment (Assessment to be complete while patient is alone) Abuse/Neglect Assessment Can Be Completed: Yes Physical Abuse: Denies(Pt denies. ) Verbal Abuse: Denies(Pt denies. ) Sexual Abuse: Denies(Pt denies. ) Exploitation of patient/patient's resources: Denies(Pt denies. ) Self-Neglect: Denies(Pt denies. )     Advance Directives (For Healthcare) Does Patient Have a Medical Advance Directive?: No Would patient like information on creating a medical advance directive?: No - Patient declined    Additional Information 1:1 In Past 12 Months?: No CIRT Risk: No Elopement Risk: No Does patient have medical clearance?: Yes     Disposition: Nira Conn, NP recommends overnight observation for safety and stabilization due to suicidal ideations with a plan. Disposition discussed with Tresa Endo, PA and Judeth Cornfield, RN.    Disposition Initial Assessment Completed for this Encounter: Yes Disposition of Patient: Other dispositions(overnight observation for safety and stabilization.) Other disposition(s): (overnight observation for safety and stabilization.)  This service was provided via telemedicine using a 2-way, interactive audio and video technology.  Names of all persons participating in this telemedicine service and their role in this encounter.               Redmond Pulling 09/18/2017 2:57 AM     Redmond Pulling, MS, [facility], CRC Triage Specialist [phone]

## 2017-11-22 ENCOUNTER — Encounter (HOSPITAL_COMMUNITY): Payer: Self-pay | Admitting: Emergency Medicine

## 2017-11-22 ENCOUNTER — Other Ambulatory Visit: Payer: Self-pay

## 2017-11-22 ENCOUNTER — Ambulatory Visit (HOSPITAL_COMMUNITY)
Admission: EM | Admit: 2017-11-22 | Discharge: 2017-11-22 | Disposition: A | Discharge status: Home / Self Care | Payer: No Typology Code available for payment source | Attending: Internal Medicine | Admitting: Internal Medicine

## 2017-11-22 DIAGNOSIS — N898 Other specified noninflammatory disorders of vagina: Secondary | ICD-10-CM | POA: Insufficient documentation

## 2017-11-22 LAB — POCT URINALYSIS DIP (DEVICE)
Bilirubin Urine: NEGATIVE
Glucose, UA: NEGATIVE mg/dL
HGB URINE DIPSTICK: NEGATIVE
Ketones, ur: NEGATIVE mg/dL
LEUKOCYTES UA: NEGATIVE
NITRITE: NEGATIVE
Protein, ur: NEGATIVE mg/dL
Specific Gravity, Urine: >=1.03 (ref 1.005–1.030)
Urobilinogen, UA: 0.2 mg/dL (ref 0.0–1.0)
pH: 5.5 (ref 5.0–8.0)

## 2017-11-22 LAB — POCT PREGNANCY, URINE: Preg Test, Ur: NEGATIVE

## 2017-11-22 MED ORDER — FLUCONAZOLE 150 MG PO TABS: 150.0000 mg | ORAL_TABLET | Freq: Every day | ORAL | 0 refills | Status: AC

## 2017-11-22 NOTE — ED Triage Notes (Signed)
Per pt lower abd pain, per pt last bm was last week,

## 2017-11-22 NOTE — Discharge Instructions (Signed)
We are going to treat you for a yeast infection, with fluconazole. This is not an STD. Please change underwear frequently and keep vaginal area dry.  We are testing you for Gonorrhea, Chlamydia, Trichomonas, Yeast and Bacterial Vaginosis. We will call you if anything is positive and let you know if you require any further treatment. Please inform partners of any positive results.   Please return if symptoms not improving with treatment, development of fever, nausea, vomiting, abdominal pain.

## 2017-11-22 NOTE — ED Provider Notes (Signed)
[facility]    CSN: [id number] Arrival date & time: 11/22/17  1505     History   Chief Complaint Chief Complaint  Patient presents with  . Abdominal Pain    HPI Wilmer Floorshley Dunlevy is a 22 y.o. female presenting with vaginal discharge and vaginal itching for 1.5 months. She states discharge is thicker than normal and white. Denies urinary symptoms of burning, increased frequency difficulty voiding. Does endorse abdominal pain yesterday but it went away and is not present today. Sexually active with 1 partner. LMP jan 3rd, not on birth control. No fever, nausea or vomiting.  HPI  Past Medical History:  Diagnosis Date  . ADHD   . Depression     There are no active problems to display for this patient.   History reviewed. No pertinent surgical history.  OB History    No data available       Home Medications    Prior to Admission medications   Medication Sig Start Date End Date Taking? Authorizing Provider  fluconazole (DIFLUCAN) 150 MG tablet Take 1 tablet (150 mg total) by mouth daily for 7 days. 11/22/17 11/29/17  Quintel Mccalla, Junius CreamerHallie C, PA-C    Family History Family History  Problem Relation Age of Onset  . Cancer Other   . Diabetes Other   . CAD Other     Social History Social History   Tobacco Use  . Smoking status: Never Smoker  . Smokeless tobacco: Never Used  Substance Use Topics  . Alcohol use: No  . Drug use: No     Allergies   Patient has no known allergies.   Review of Systems Review of Systems  Constitutional: Negative for fever.  Respiratory: Negative for shortness of breath.   Cardiovascular: Negative for chest pain.  Gastrointestinal: Negative for abdominal pain, diarrhea, nausea and vomiting.  Genitourinary: Positive for vaginal discharge. Negative for dysuria, flank pain, genital sores, hematuria, menstrual problem, vaginal bleeding and vaginal pain.  Musculoskeletal: Negative for back pain.  Skin: Negative for rash.    Neurological: Negative for dizziness, light-headedness and headaches.     Physical Exam Triage Vital Signs ED Triage Vitals  Enc Vitals Group     BP 11/22/17 1659 (!) 115/41     Pulse Rate 11/22/17 1659 76     Resp --      Temp 11/22/17 1659 (!) 97.5 F (36.4 C)     Temp Source 11/22/17 1659 Oral     SpO2 11/22/17 1659 100 %     Weight --      Height --      Head Circumference --      Peak Flow --      Pain Score 11/22/17 1658 6     Pain Loc --      Pain Edu? --      Excl. in GC? --    No data found.  Updated Vital Signs BP (!) 115/41 (BP Location: Left Arm)   Pulse 76   Temp (!) 97.5 F (36.4 C) (Oral)   LMP 11/05/2017 (Exact Date)   SpO2 100%   Visual Acuity Right Eye Distance:   Left Eye Distance:   Bilateral Distance:    Right Eye Near:   Left Eye Near:    Bilateral Near:     Physical Exam  Constitutional: She appears well-developed and well-nourished. No distress.  HENT:  Head: Normocephalic and atraumatic.  Eyes: Conjunctivae are normal.  Neck: Neck supple.  Cardiovascular: Normal rate and  regular rhythm.  No murmur heard. Pulmonary/Chest: Effort normal and breath sounds normal. No respiratory distress.  Abdominal: Soft. There is tenderness in the right lower quadrant. There is no rigidity, no rebound, no guarding, no CVA tenderness, no tenderness at McBurney's point and negative Murphy's sign.  Genitourinary:  Genitourinary Comments: Denies pelvic pain or rashes  Musculoskeletal: She exhibits no edema.  Neurological: She is alert.  Skin: Skin is warm and dry.  Psychiatric: She has a normal mood and affect.  Nursing note and vitals reviewed.    UC Treatments / Results  Labs (all labs ordered are listed, but only abnormal results are displayed) Labs Reviewed  CERVICOVAGINAL ANCILLARY ONLY    EKG  EKG Interpretation None       Radiology No results found.  Procedures Procedures (including critical care time)  Medications Ordered  in UC Medications - No data to display   Initial Impression / Assessment and Plan / UC Course  I have reviewed the triage vital signs and the nursing notes.  Pertinent labs & imaging results that were available during my care of the patient were reviewed by me and considered in my medical decision making (see chart for details).     Patient with vaginal discharge and tiching, will treat for yeast with diflucan. Screening for STD's performed, will call to inform of positive results.   Patient lacking fever, nausea, vomiting, abdominal pain, CMT; unlikely PID. Discussed strict return precautions. Patient verbalized understanding and is agreeable with plan.   Final Clinical Impressions(s) / UC Diagnoses   Final diagnoses:  Vaginal discharge  Vaginal itching    ED Discharge Orders        Ordered    fluconazole (DIFLUCAN) 150 MG tablet  Daily     11/22/17 1739       Controlled Substance Prescriptions  Controlled Substance Registry consulted? Not Applicable   Lew Dawes, [place] 11/22/17 1742

## 2017-11-23 LAB — CERVICOVAGINAL ANCILLARY ONLY
BACTERIAL VAGINITIS: POSITIVE — AB
CANDIDA VAGINITIS: POSITIVE — AB
CHLAMYDIA, DNA PROBE: NEGATIVE
NEISSERIA GONORRHEA: NEGATIVE
Trichomonas: NEGATIVE

## 2017-11-26 ENCOUNTER — Telehealth (HOSPITAL_COMMUNITY): Payer: Self-pay | Admitting: Emergency Medicine

## 2017-11-26 MED ORDER — METRONIDAZOLE 500 MG PO TABS: 500.0000 mg | ORAL_TABLET | Freq: Two times a day (BID) | ORAL | 0 refills | Status: DC

## 2017-11-26 NOTE — Telephone Encounter (Signed)
Called pt to notify of recent lab results.... Pt ID'd properly Reports feeling better and sx are subsiding Pt is taking/tolarating well meds given at visit.  Sts even though she is asymptomatic for BV, still wants us to call in Flagyl Rx sent to [facility] ([address]) per her request.  Adv pt if sx are not getting better to return or to f/u w/PCP Education on safe sex given Notified pt that lab results can be obtained through MyChart Pt verb understanding.

## 2017-11-26 NOTE — Telephone Encounter (Signed)
-----   Message from Isa RankinLaura Wilson Murray, MD sent at 11/24/2017  9:59 AM EST ----- Clinical staff, please let patient know that test for candida (yeast) was positive.  Rx fluconazole was given at the urgent care visit   Test for gardnerella (bacterial vaginosis) was also positive.  This only needs to be treated if there are persistent symptoms, such as vaginal irritation/discharge.   If these symptoms are present, ok to send rx for metronidazole 500mg  bid x 7d #14 no refills or metronidazole vaginal gel 0.75% 1 applicatorful bid x 7d #14 no refills.  Recheck for further evaluation if symptoms are not improving.  LM

## 2017-12-31 ENCOUNTER — Encounter (HOSPITAL_COMMUNITY): Payer: Self-pay | Admitting: Emergency Medicine

## 2017-12-31 ENCOUNTER — Emergency Department (HOSPITAL_COMMUNITY): Payer: No Typology Code available for payment source

## 2017-12-31 ENCOUNTER — Emergency Department (HOSPITAL_COMMUNITY)
Admission: EM | Admit: 2017-12-31 | Discharge: 2017-12-31 | Disposition: A | Discharge status: Home / Self Care | Payer: No Typology Code available for payment source | Attending: Emergency Medicine | Admitting: Emergency Medicine

## 2017-12-31 DIAGNOSIS — Z79899 Other long term (current) drug therapy: Secondary | ICD-10-CM | POA: Insufficient documentation

## 2017-12-31 DIAGNOSIS — S8981XA Other specified injuries of right lower leg, initial encounter: Secondary | ICD-10-CM | POA: Diagnosis present

## 2017-12-31 DIAGNOSIS — Y939 Activity, unspecified: Secondary | ICD-10-CM | POA: Diagnosis not present

## 2017-12-31 DIAGNOSIS — Y998 Other external cause status: Secondary | ICD-10-CM | POA: Diagnosis not present

## 2017-12-31 DIAGNOSIS — R10814 Left lower quadrant abdominal tenderness: Secondary | ICD-10-CM | POA: Diagnosis not present

## 2017-12-31 DIAGNOSIS — S8001XA Contusion of right knee, initial encounter: Secondary | ICD-10-CM | POA: Diagnosis not present

## 2017-12-31 DIAGNOSIS — S8011XA Contusion of right lower leg, initial encounter: Secondary | ICD-10-CM | POA: Diagnosis not present

## 2017-12-31 DIAGNOSIS — Z23 Encounter for immunization: Secondary | ICD-10-CM | POA: Diagnosis not present

## 2017-12-31 DIAGNOSIS — Y92410 Unspecified street and highway as the place of occurrence of the external cause: Secondary | ICD-10-CM | POA: Diagnosis not present

## 2017-12-31 LAB — COMPREHENSIVE METABOLIC PANEL
ALBUMIN: 4 g/dL (ref 3.5–5.0)
ALK PHOS: 60 U/L (ref 38–126)
ALT: 9 U/L — AB (ref 14–54)
ANION GAP: 9 (ref 5–15)
AST: 20 U/L (ref 15–41)
BUN: 8 mg/dL (ref 6–20)
CALCIUM: 9.3 mg/dL (ref 8.9–10.3)
CO2: 23 mmol/L (ref 22–32)
CREATININE: 0.86 mg/dL (ref 0.44–1.00)
Chloride: 111 mmol/L (ref 101–111)
GFR calc Af Amer: >60 mL/min (ref >60)
GFR calc non Af Amer: >60 mL/min (ref >60)
GLUCOSE: 107 mg/dL — AB (ref 65–99)
Potassium: 4.3 mmol/L (ref 3.5–5.1)
Sodium: 143 mmol/L (ref 135–145)
Total Bilirubin: 0.5 mg/dL (ref 0.3–1.2)
Total Protein: 6.7 g/dL (ref 6.5–8.1)

## 2017-12-31 LAB — CBC
HCT: 35.8 % — ABNORMAL LOW (ref 36.0–46.0)
Hemoglobin: 11.5 g/dL — ABNORMAL LOW (ref 12.0–15.0)
MCH: 23 pg — AB (ref 26.0–34.0)
MCHC: 32.1 g/dL (ref 30.0–36.0)
MCV: 71.7 fL — ABNORMAL LOW (ref 78.0–100.0)
Platelets: 163 10*3/uL (ref 150–400)
RBC: 4.99 MIL/uL (ref 3.87–5.11)
RDW: 15.2 % (ref 11.5–15.5)
WBC: 6.5 10*3/uL (ref 4.0–10.5)

## 2017-12-31 LAB — I-STAT CHEM 8, ED
BUN: 9 mg/dL (ref 6–20)
CALCIUM ION: 1.12 mmol/L — AB (ref 1.15–1.40)
CHLORIDE: 110 mmol/L (ref 101–111)
Creatinine, Ser: 0.8 mg/dL (ref 0.44–1.00)
Glucose, Bld: 108 mg/dL — ABNORMAL HIGH (ref 65–99)
HCT: 37 % (ref 36.0–46.0)
Hemoglobin: 12.6 g/dL (ref 12.0–15.0)
POTASSIUM: 4.2 mmol/L (ref 3.5–5.1)
SODIUM: 144 mmol/L (ref 135–145)
TCO2: 24 mmol/L (ref 22–32)

## 2017-12-31 LAB — URINALYSIS, ROUTINE W REFLEX MICROSCOPIC
BILIRUBIN URINE: NEGATIVE
GLUCOSE, UA: NEGATIVE mg/dL
HGB URINE DIPSTICK: NEGATIVE
Ketones, ur: NEGATIVE mg/dL
Leukocytes, UA: NEGATIVE
Nitrite: NEGATIVE
PROTEIN: NEGATIVE mg/dL
Specific Gravity, Urine: 1.015 (ref 1.005–1.030)
pH: 6 (ref 5.0–8.0)

## 2017-12-31 LAB — ETHANOL

## 2017-12-31 LAB — PROTIME-INR
INR: 0.98
Prothrombin Time: 12.9 seconds (ref 11.4–15.2)

## 2017-12-31 LAB — CDS SEROLOGY

## 2017-12-31 LAB — I-STAT BETA HCG BLOOD, ED (MC, WL, AP ONLY): I-stat hCG, quantitative: <5 m[IU]/mL (ref <5)

## 2017-12-31 LAB — I-STAT CG4 LACTIC ACID, ED: Lactic Acid, Venous: 1.52 mmol/L (ref 0.5–1.9)

## 2017-12-31 MED ORDER — IOPAMIDOL (ISOVUE-300) INJECTION 61%
INTRAVENOUS | Status: AC
  Administered 2017-12-31: 100 mL
  Filled 2017-12-31: qty 100

## 2017-12-31 MED ORDER — IBUPROFEN 400 MG PO TABS: 400.0000 mg | ORAL_TABLET | Freq: Once | ORAL | Status: DC

## 2017-12-31 MED ORDER — IBUPROFEN 400 MG PO TABS: 400.0000 mg | ORAL_TABLET | Freq: Three times a day (TID) | ORAL | 0 refills | Status: DC | PRN

## 2017-12-31 MED ORDER — FENTANYL CITRATE (PF) 100 MCG/2ML IJ SOLN
INTRAMUSCULAR | Status: AC
Start: 2017-12-31 — End: 2017-12-31
  Administered 2017-12-31: 50 ug
  Filled 2017-12-31: qty 2

## 2017-12-31 MED ORDER — TETANUS-DIPHTH-ACELL PERTUSSIS 5-2.5-18.5 LF-MCG/0.5 IM SUSP
INTRAMUSCULAR | Status: AC
  Administered 2017-12-31: 0.5 mL via INTRAMUSCULAR
  Filled 2017-12-31: qty 0.5
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
## 2018-01-01 LAB — SAMPLE TO BLOOD BANK

## 2018-01-09 ENCOUNTER — Other Ambulatory Visit: Payer: Self-pay

## 2018-01-09 ENCOUNTER — Encounter (HOSPITAL_COMMUNITY): Payer: Self-pay | Admitting: *Deleted

## 2018-01-09 ENCOUNTER — Emergency Department (HOSPITAL_COMMUNITY)
Admission: EM | Admit: 2018-01-09 | Discharge: 2018-01-09 | Disposition: A | Discharge status: Home / Self Care | Payer: No Typology Code available for payment source | Attending: Emergency Medicine | Admitting: Emergency Medicine

## 2018-01-09 DIAGNOSIS — N739 Female pelvic inflammatory disease, unspecified: Secondary | ICD-10-CM | POA: Insufficient documentation

## 2018-01-09 DIAGNOSIS — F329 Major depressive disorder, single episode, unspecified: Secondary | ICD-10-CM | POA: Diagnosis not present

## 2018-01-09 DIAGNOSIS — B373 Candidiasis of vulva and vagina: Secondary | ICD-10-CM | POA: Diagnosis not present

## 2018-01-09 DIAGNOSIS — B3731 Acute candidiasis of vulva and vagina: Secondary | ICD-10-CM

## 2018-01-09 DIAGNOSIS — Z79899 Other long term (current) drug therapy: Secondary | ICD-10-CM | POA: Insufficient documentation

## 2018-01-09 DIAGNOSIS — N730 Acute parametritis and pelvic cellulitis: Secondary | ICD-10-CM

## 2018-01-09 DIAGNOSIS — F909 Attention-deficit hyperactivity disorder, unspecified type: Secondary | ICD-10-CM | POA: Insufficient documentation

## 2018-01-09 DIAGNOSIS — N898 Other specified noninflammatory disorders of vagina: Secondary | ICD-10-CM | POA: Diagnosis present

## 2018-01-09 LAB — URINALYSIS, ROUTINE W REFLEX MICROSCOPIC
BILIRUBIN URINE: NEGATIVE
Glucose, UA: NEGATIVE mg/dL
KETONES UR: NEGATIVE mg/dL
Nitrite: POSITIVE — AB
PH: 8 (ref 5.0–8.0)
PROTEIN: NEGATIVE mg/dL
Specific Gravity, Urine: 1.014 (ref 1.005–1.030)

## 2018-01-09 LAB — WET PREP, GENITAL
Sperm: NONE SEEN
Trich, Wet Prep: NONE SEEN

## 2018-01-09 LAB — POC URINE PREG, ED: Preg Test, Ur: NEGATIVE

## 2018-01-09 MED ORDER — DOXYCYCLINE HYCLATE 100 MG PO CAPS: 100.0000 mg | ORAL_CAPSULE | Freq: Two times a day (BID) | ORAL | 0 refills | Status: AC

## 2018-01-09 MED ORDER — FLUCONAZOLE 150 MG PO TABS
150.0000 mg | ORAL_TABLET | Freq: Once | ORAL | Status: AC
  Administered 2018-01-09: 150 mg via ORAL
  Filled 2018-01-09: qty 1

## 2018-01-09 MED ORDER — LIDOCAINE HCL (PF) 1 % IJ SOLN
INTRAMUSCULAR | Status: AC
  Administered 2018-01-09: 0.9 mL
  Filled 2018-01-09: qty 5

## 2018-01-09 MED ORDER — CEFTRIAXONE SODIUM 250 MG IJ SOLR
250.0000 mg | Freq: Once | INTRAMUSCULAR | Status: AC
  Administered 2018-01-09: 250 mg via INTRAMUSCULAR
  Filled 2018-01-09: qty 250

## 2018-01-09 NOTE — ED Notes (Signed)
Got patient into a gown patient is resting with call bell in reach 

## 2018-01-09 NOTE — Discharge Instructions (Signed)
Please take all of your antibiotics until finished!   You may develop abdominal discomfort or diarrhea from the antibiotic.  You may help offset this with probiotics which you can buy or get in yogurt. Do not eat  or take the probiotics until 2 hours after your antibiotic.   Follow-up with an OB/GYN or primary care physician for reevaluation of symptoms.  Return to the emergency department if any concerning signs or symptoms develop such as fevers, severe abdominal pain, or persistent vomiting

## 2018-01-09 NOTE — ED Triage Notes (Signed)
Pt in c/o vaginal irritation and left flank pain that has been intermittent for the last few days, no distress noted, reports nausea

## 2018-01-09 NOTE — ED Provider Notes (Signed)
[facility] Provider Note   CSN: [id number] Arrival date & time: 01/09/18  1021     History   Chief Complaint Chief Complaint  Patient presents with  . Vaginal Pain  . Flank Pain    HPI Cassidy Bruce is a 22 y.o. female with history of ADHD and depression presents today for evaluation of acute onset of vaginal itching and discharge for 5 days.  She notes dysuria and urinary urgency and frequency but denies hematuria.  She denies vaginal bleeding.  Last menstrual period was mid February and was normal for her.  She notes that 5 days ago when her symptoms began she had very mild left lower quadrant pain but states that this has entirely resolved.  She also endorses nausea but no vomiting.  Denies fevers or chills.  Denies constipation, diarrhea, melena, or hematochezia.  She is currently sexually active with 2 males and uses protection with only 1.  She is not on birth control.  The history is provided by the patient.    Past Medical History:  Diagnosis Date  . ADHD   . Depression     There are no active problems to display for this patient.   History reviewed. No pertinent surgical history.  OB History    No data available       Home Medications    Prior to Admission medications   Medication Sig Start Date End Date Taking? Authorizing Provider  doxycycline (VIBRAMYCIN) 100 MG capsule Take 1 capsule (100 mg total) by mouth 2 (two) times daily for 14 days. 01/09/18 01/23/18  Michela PitcherFawze, Alexi Geibel A, PA-C  ibuprofen (ADVIL,MOTRIN) 400 MG tablet Take 1 tablet (400 mg total) by mouth every 8 (eight) hours as needed for up to 30 doses. 12/31/17   Jaynie CollinsAugustin, Wun, DO  metroNIDAZOLE (FLAGYL) 500 MG tablet Take 1 tablet (500 mg total) by mouth 2 (two) times daily. 11/26/17   Isa RankinMurray, Laura Wilson, MD    Family History Family History  Problem Relation Age of Onset  . Cancer Other   . Diabetes Other   . CAD Other     Social History Social History    Tobacco Use  . Smoking status: Never Smoker  . Smokeless tobacco: Never Used  Substance Use Topics  . Alcohol use: No  . Drug use: No     Allergies   Patient has no known allergies.   Review of Systems Review of Systems  Constitutional: Negative for chills and fever.  Gastrointestinal: Positive for nausea. Negative for abdominal pain (resolved) and vomiting.  Genitourinary: Positive for dysuria, frequency, urgency and vaginal discharge. Negative for genital sores, hematuria, vaginal bleeding and vaginal pain.       +vaginal itching  All other systems reviewed and are negative.    Physical Exam Updated Vital Signs BP 112/77 (BP Location: Right Arm)   Pulse 69   Temp 98 F (36.7 C) (Oral)   Resp 16   Ht 5\' 4"  (1.626 m)   Wt 52.2 kg (115 lb)   LMP 12/06/2017   SpO2 100%   BMI 19.74 kg/m   Physical Exam  Constitutional: She appears well-developed and well-nourished. No distress.  HENT:  Head: Normocephalic and atraumatic.  Eyes: Conjunctivae are normal. Right eye exhibits no discharge. Left eye exhibits no discharge.  Neck: No JVD present. No tracheal deviation present.  Cardiovascular: Normal rate, regular rhythm and normal heart sounds.  Pulmonary/Chest: Effort normal and breath sounds normal.  Abdominal: Soft. Bowel  sounds are normal. She exhibits no distension. There is no tenderness. There is no rebound and no guarding.  Murphy sign absent, Rovsing's absent, no CVA tenderness  Genitourinary:  Genitourinary Comments: Examination performed in the presence of a chaperone.  No masses or lesions to the external genitalia.  There is copious chunky white discharge in the vaginal vault.  There is cervical motion tenderness.  Cervix is normal in appearance, not friable or bleeding.  No adnexal tenderness.  Musculoskeletal: She exhibits no edema.  Neurological: She is alert.  Skin: Skin is warm and dry. No erythema.  Psychiatric: She has a normal mood and affect. Her  behavior is normal.  Nursing note and vitals reviewed.    ED Treatments / Results  Labs (all labs ordered are listed, but only abnormal results are displayed) Labs Reviewed  WET PREP, GENITAL - Abnormal; Notable for the following components:      Result Value   Yeast Wet Prep HPF POC PRESENT (*)    Clue Cells Wet Prep HPF POC PRESENT (*)    WBC, Wet Prep HPF POC MODERATE (*)    All other components within normal limits  URINALYSIS, ROUTINE W REFLEX MICROSCOPIC - Abnormal; Notable for the following components:   APPearance CLOUDY (*)    Hgb urine dipstick MODERATE (*)    Nitrite POSITIVE (*)    Leukocytes, UA LARGE (*)    Bacteria, UA MANY (*)    Squamous Epithelial / LPF 6-30 (*)    All other components within normal limits  URINE CULTURE  RPR  HIV ANTIBODY (ROUTINE TESTING)  POC URINE PREG, ED  GC/CHLAMYDIA PROBE AMP ([place]) NOT AT [facility]    EKG  EKG Interpretation None       Radiology No results found.  Procedures Procedures (including critical care time)  Medications Ordered in ED Medications  fluconazole (DIFLUCAN) tablet 150 mg (150 mg Oral Given 01/09/18 1345)  cefTRIAXone (ROCEPHIN) injection 250 mg (250 mg Intramuscular Given 01/09/18 1341)  lidocaine (PF) (XYLOCAINE) 1 % injection (0.9 mLs  Given 01/09/18 1341)     Initial Impression / Assessment and Plan / ED Course  I have reviewed the triage vital signs and the nursing notes.  Pertinent labs & imaging results that were available during my care of the patient were reviewed by me and considered in my medical decision making (see chart for details).     Patient presents with urinary symptoms and vaginal itching and discharge for 5 days.  She is afebrile, vital signs are stable.  She is nontoxic in appearance and abdominal examination is entirely benign.  However she does have cervical motion tenderness on examination.  GU exam is also consistent with yeast infection.  UA is suggestive of UTI although  with vaginal symptoms and cervical motion tenderness more likely PID.  Will culture.  I doubt obstruction, perforation, appendicitis, colitis, TOA, ovarian torsion, or ectopic pregnancy.  She received Rocephin and Diflucan in the ED.  Will discharge with 2-week course of doxycycline.  Recommend follow-up with primary care physician or OB/GYN for reevaluation.  Discussed indications for return to the ED. Pt verbalized understanding of and agreement with plan and is safe for discharge home at this time.   Final Clinical Impressions(s) / ED Diagnoses   Final diagnoses:  PID (acute pelvic inflammatory disease)  Candidiasis of genitalia in female    ED Discharge Orders        Ordered    doxycycline (VIBRAMYCIN) 100 MG capsule  2 times daily     01/09/18 1330       Bennye Alm 01/09/18 1729    Arby Barrette, MD 01/10/18 [phone]

## 2018-01-10 LAB — HIV ANTIBODY (ROUTINE TESTING W REFLEX): HIV Screen 4th Generation wRfx: NONREACTIVE

## 2018-01-10 LAB — RPR: RPR Ser Ql: NONREACTIVE

## 2018-01-11 LAB — URINE CULTURE: Culture: >=100000 — AB

## 2018-01-11 LAB — GC/CHLAMYDIA PROBE AMP (~~LOC~~) NOT AT ARMC
Chlamydia: NEGATIVE
Neisseria Gonorrhea: NEGATIVE

## 2018-01-12 ENCOUNTER — Telehealth: Payer: Self-pay | Admitting: Emergency Medicine

## 2018-01-12 NOTE — Telephone Encounter (Signed)
Post ED Visit - Positive Culture Follow-up: Successful Patient Follow-Up  Culture assessed and recommendations reviewed by: []  Enzo BiNathan Batchelder, Pharm.D. []  Celedonio MiyamotoJeremy Frens, [address]Ph[address]D., BCPS AQ-ID []  Garvin FilaMike Maccia, Pharm.D., BCPS []  Georgina PillionElizabeth Martin, [address]Ph[address]D., BCPS []  [place]h Pham, [place]Pharm.D., BCPS, AAHIVP []  Estella HuskMichelle Turner, Pharm.D., BCPS, AAHIVP []  Lysle Pearlachel Rumbarger, PharmD, BCPS []  Casilda Carlsaylor Stone, PharmD, BCPS []  Pollyann SamplesAndy Johnston, PharmD, BCPS Adline PotterSabrina Dunham Pharm D  Positive urine culture  [x]  Patient discharged without antimicrobial prescription and treatment is now indicated []  Organism is resistant to prescribed ED discharge antimicrobial []  Patient with positive blood cultures  Changes discussed with ED provider: Harvie HeckSamantha Petrucelli PA New antibiotic prescription start Bactrim DS 1 bid x [facility] [address]  Contacted patient, 01/12/2018 1320   Cassidy Bruce, Cassidy Bruce 01/12/2018, 1:18 PM

## 2018-01-12 NOTE — Progress Notes (Signed)
ED Antimicrobial Stewardship Positive Culture Follow Up   Cassidy Bruce is an 22 y.o. female who presented to [facility] on 01/09/2018 with a chief complaint of vaginal pain, flank pain, dysuria and urinary frequency.   Chief Complaint  Patient presents with  . Vaginal Pain  . Flank Pain    Recent Results (from the past 720 hour(s))  Urine culture     Status: Abnormal   Collection Time: 01/09/18 11:05 AM  Result Value Ref Range Status   Specimen Description URINE, RANDOM  Final   Special Requests   Final    NONE Performed at [facility], [address] [place], [place] [address]    Culture >=100,000 COLONIES/mL ESCHERICHIA COLI (A)  Final   Report Status 01/11/2018 FINAL  Final   Organism ID, Bacteria ESCHERICHIA COLI (A)  Final      Susceptibility   Escherichia coli - MIC*    AMPICILLIN >=32 RESISTANT Resistant     CEFAZOLIN <=4 SENSITIVE Sensitive     CEFTRIAXONE <=1 SENSITIVE Sensitive     CIPROFLOXACIN <=0.25 SENSITIVE Sensitive     GENTAMICIN <=1 SENSITIVE Sensitive     IMIPENEM <=0.25 SENSITIVE Sensitive     NITROFURANTOIN <=16 SENSITIVE Sensitive     TRIMETH/SULFA <=20 SENSITIVE Sensitive     AMPICILLIN/SULBACTAM 16 INTERMEDIATE Intermediate     PIP/TAZO <=4 SENSITIVE Sensitive     Extended ESBL NEGATIVE Sensitive     * >=100,000 COLONIES/mL ESCHERICHIA COLI  Wet prep, genital     Status: Abnormal   Collection Time: 01/09/18 11:45 AM  Result Value Ref Range Status   Yeast Wet Prep HPF POC PRESENT (A) NONE SEEN Final   Trich, Wet Prep NONE SEEN NONE SEEN Final   Clue Cells Wet Prep HPF POC PRESENT (A) NONE SEEN Final   WBC, Wet Prep HPF POC MODERATE (A) NONE SEEN Final   Sperm NONE SEEN  Final    Comment: Performed at [facility], [address] [place], [place] [address]    [x]  Patient discharged originally without antimicrobial agent and treatment is now indicated  New antibiotic prescription: Bactrim 1 DS BID x 3 days for UTI.   ED Provider:  Harvie HeckSamantha Petrucelli PA-C  Adline PotterSabrina Melitza Metheny, PharmD Pharmacy Resident Pager: [phone]

## 2018-03-03 ENCOUNTER — Other Ambulatory Visit: Payer: Self-pay

## 2018-03-03 ENCOUNTER — Emergency Department (HOSPITAL_COMMUNITY)
Admission: EM | Admit: 2018-03-03 | Discharge: 2018-03-03 | Disposition: A | Discharge status: Home / Self Care | Payer: No Typology Code available for payment source | Attending: Emergency Medicine | Admitting: Emergency Medicine

## 2018-03-03 ENCOUNTER — Encounter (HOSPITAL_COMMUNITY): Payer: Self-pay

## 2018-03-03 DIAGNOSIS — R55 Syncope and collapse: Secondary | ICD-10-CM

## 2018-03-03 DIAGNOSIS — R569 Unspecified convulsions: Secondary | ICD-10-CM | POA: Diagnosis not present

## 2018-03-03 LAB — I-STAT BETA HCG BLOOD, ED (MC, WL, AP ONLY): I-stat hCG, quantitative: <5 m[IU]/mL (ref <5)
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
## 2018-03-03 NOTE — ED Notes (Signed)
Bed: WA17 Expected date:  Expected time:  Means of arrival:  Comments: 89 yF Witnessed Seizure

## 2018-03-03 NOTE — Discharge Instructions (Signed)
Eat and drink well for the next couple days.  Return for worsening symptoms

## 2018-03-09 ENCOUNTER — Emergency Department (HOSPITAL_COMMUNITY)
Admission: EM | Admit: 2018-03-09 | Discharge: 2018-03-10 | Disposition: A | Discharge status: Home / Self Care | Payer: No Typology Code available for payment source | Attending: Emergency Medicine | Admitting: Emergency Medicine

## 2018-03-09 ENCOUNTER — Other Ambulatory Visit: Payer: Self-pay

## 2018-03-09 DIAGNOSIS — R102 Pelvic and perineal pain: Secondary | ICD-10-CM | POA: Diagnosis not present

## 2018-03-09 DIAGNOSIS — R300 Dysuria: Secondary | ICD-10-CM | POA: Diagnosis present

## 2018-03-09 DIAGNOSIS — B379 Candidiasis, unspecified: Secondary | ICD-10-CM | POA: Diagnosis not present

## 2018-03-09 DIAGNOSIS — N3000 Acute cystitis without hematuria: Secondary | ICD-10-CM | POA: Diagnosis not present

## 2018-03-10 ENCOUNTER — Encounter (HOSPITAL_COMMUNITY): Payer: Self-pay | Admitting: Emergency Medicine

## 2018-03-10 LAB — WET PREP, GENITAL
CLUE CELLS WET PREP: NONE SEEN
SPERM: NONE SEEN
Trich, Wet Prep: NONE SEEN

## 2018-03-10 LAB — I-STAT BETA HCG BLOOD, ED (MC, WL, AP ONLY)

## 2018-03-10 LAB — URINALYSIS, ROUTINE W REFLEX MICROSCOPIC
Bilirubin Urine: NEGATIVE
Glucose, UA: NEGATIVE mg/dL
KETONES UR: NEGATIVE mg/dL
Nitrite: NEGATIVE
PH: 5 (ref 5.0–8.0)
Protein, ur: NEGATIVE mg/dL
SPECIFIC GRAVITY, URINE: 1.016 (ref 1.005–1.030)

## 2018-03-10 LAB — GC/CHLAMYDIA PROBE AMP (~~LOC~~) NOT AT ARMC
CHLAMYDIA, DNA PROBE: NEGATIVE
Neisseria Gonorrhea: NEGATIVE

## 2018-03-10 MED ORDER — FLUCONAZOLE 100 MG PO TABS
100.0000 mg | ORAL_TABLET | Freq: Once | ORAL | Status: AC
  Administered 2018-03-10: 100 mg via ORAL
  Filled 2018-03-10: qty 1

## 2018-03-10 MED ORDER — CEPHALEXIN 500 MG PO CAPS: 500.0000 mg | ORAL_CAPSULE | Freq: Three times a day (TID) | ORAL | 0 refills | Status: DC

## 2018-03-10 NOTE — ED Notes (Signed)
Pt. Upset and irritable about the wait time, asked lobby tech- Judeth Cornfield how much longer, answer was provided and pt. Walked away [employer] . Proceeded to pick up cup of water at seat and pour it all on the floor in front of her seat.

## 2018-03-10 NOTE — ED Notes (Signed)
PT states understanding of care given, follow up care, and medication prescribed. PT ambulated from ED to car with a steady gait. 

## 2018-03-10 NOTE — ED Triage Notes (Signed)
Pt reports vaginal discharge that is white in color since yesterday. Reports no odor. Burning sensation in vagina and on labia. Denies fevers. LMP 4/20.

## 2018-03-10 NOTE — ED Provider Notes (Signed)
[facility] EMERGENCY DEPARTMENT Provider Note   CSN: [id number] Arrival date & time: 03/09/18  2343     History   Chief Complaint Chief Complaint  Patient presents with  . Vaginal Discharge    HPI Cassidy Bruce is a 22 y.o. female.  HPI  This is a 22 year old female who presents with dysuria and vaginal itching.  Patient reports onset of symptoms yesterday.  Patient reports burning with urination, vaginal itching, and white vaginal discharge.  She reports no new sexual partners.  She does not regularly use condoms.  No history of STDs.  She denies any fevers, flank pain, abdominal pain, nausea, vomiting.  Past Medical History:  Diagnosis Date  . ADHD   . Depression     There are no active problems to display for this patient.   History reviewed. No pertinent surgical history.   OB History   None      Home Medications    Prior to Admission medications   Medication Sig Start Date End Date Taking? Authorizing Provider  cephALEXin (KEFLEX) 500 MG capsule Take 1 capsule (500 mg total) by mouth 3 (three) times daily. 03/10/18   Troyce Gieske, Mayer Masker, MD  ibuprofen (ADVIL,MOTRIN) 400 MG tablet Take 1 tablet (400 mg total) by mouth every 8 (eight) hours as needed for up to 30 doses. Patient not taking: Reported on 03/03/2018 12/31/17   Jaynie Difatta, DO  metroNIDAZOLE (FLAGYL) 500 MG tablet Take 1 tablet (500 mg total) by mouth 2 (two) times daily. Patient not taking: Reported on 03/03/2018 11/26/17   Isa Rankin, MD    Family History Family History  Problem Relation Age of Onset  . Cancer Other   . Diabetes Other   . CAD Other     Social History Social History   Tobacco Use  . Smoking status: Never Smoker  . Smokeless tobacco: Never Used  Substance Use Topics  . Alcohol use: No  . Drug use: No     Allergies   Patient has no known allergies.   Review of Systems Review of Systems  Constitutional: Negative for fever.    Gastrointestinal: Negative for abdominal pain, nausea and vomiting.  Genitourinary: Positive for dysuria and vaginal discharge. Negative for flank pain.  All other systems reviewed and are negative.    Physical Exam Updated Vital Signs BP (!) 102/59   Pulse 72   Temp 98 F (36.7 C)   Resp 17   Ht  (1.651 m)   Wt 49.9 kg (110 lb)   LMP 02/20/2018 (Exact Date)   SpO2 98%   BMI 18.30 kg/m   Physical Exam  Constitutional: She is oriented to person, place, and time. She appears well-developed and well-nourished. No distress.  HENT:  Head: Normocephalic and atraumatic.  Cardiovascular: Normal rate and regular rhythm.  Pulmonary/Chest: Effort normal. No respiratory distress.  Abdominal: Soft. Bowel sounds are normal. There is no tenderness.  Genitourinary:  Genitourinary Comments: Normal external vaginal exam, no lesions noted, thick white vaginal discharge noted, no cervical friability noted  Neurological: She is alert and oriented to person, place, and time.  Skin: Skin is warm and dry.  Psychiatric: She has a normal mood and affect.  Nursing note and vitals reviewed.    ED Treatments / Results  Labs (all labs ordered are listed, but only abnormal results are displayed) Labs Reviewed  WET PREP, GENITAL - Abnormal; Notable for the following components:      Result Value  Yeast Wet Prep HPF POC PRESENT (*)    WBC, Wet Prep HPF POC MANY (*)    All other components within normal limits  URINALYSIS, ROUTINE W REFLEX MICROSCOPIC - Abnormal; Notable for the following components:   APPearance CLOUDY (*)    Hgb urine dipstick SMALL (*)    Leukocytes, UA LARGE (*)    Bacteria, UA MANY (*)    All other components within normal limits  URINE CULTURE  I-STAT BETA HCG BLOOD, ED ([facility], [facility], [facility] ONLY)  GC/CHLAMYDIA PROBE AMP ([place]) NOT AT [facility]    EKG None  Radiology No results found.  Procedures Procedures (including critical care time)  Medications Ordered in  ED Medications  fluconazole (DIFLUCAN) tablet 100 mg (has no administration in time range)     Initial Impression / Assessment and Plan / ED Course  I have reviewed the triage vital signs and the nursing notes.  Pertinent labs & imaging results that were available during my care of the patient were reviewed by me and considered in my medical decision making (see chart for details).     Patient presents with dysuria and vaginal discharge.  She is overall nontoxic-appearing and vital signs are reassuring.  Pelvic exam is notable for thick white vaginal discharge most consistent with yeast.  Patient denies concerns for STDs.  However, GC testing was sent.  Urinalysis also has white cells and many bacteria.  We will culture.  Given dysuria, will treat with Keflex.  Suspect simple acute cystitis.  Doubt pyelonephritis.  After history, exam, and medical workup I feel the patient has been appropriately medically screened and is safe for discharge home. Pertinent diagnoses were discussed with the patient. Patient was given return precautions.  Final Clinical Impressions(s) / ED Diagnoses   Final diagnoses:  Yeast infection  Acute cystitis without hematuria    ED Discharge Orders        Ordered    cephALEXin (KEFLEX) 500 MG capsule  3 times daily     03/10/18 0627       Shon Baton, MD 03/10/18 [phone]

## 2018-03-10 NOTE — Discharge Instructions (Signed)
You were seen today and found to have a urinary tract infection and yeast infection.  Take antibiotics as prescribed.  You were given a dose of Diflucan for your yeast infection.  Symptoms should be improving in the next 1 to 2 days.

## 2018-03-11 LAB — URINE CULTURE: Culture: NO GROWTH

## 2018-06-13 ENCOUNTER — Emergency Department (HOSPITAL_COMMUNITY)
Admission: EM | Admit: 2018-06-13 | Discharge: 2018-06-14 | Disposition: A | Discharge status: Home / Self Care | Payer: Self-pay | Attending: Emergency Medicine | Admitting: Emergency Medicine

## 2018-06-13 ENCOUNTER — Encounter (HOSPITAL_COMMUNITY): Payer: Self-pay | Admitting: *Deleted

## 2018-06-13 ENCOUNTER — Emergency Department (HOSPITAL_COMMUNITY)
Admission: EM | Admit: 2018-06-13 | Discharge: 2018-06-13 | Disposition: A | Discharge status: Home / Self Care | Payer: No Typology Code available for payment source | Attending: Emergency Medicine | Admitting: Emergency Medicine

## 2018-06-13 ENCOUNTER — Emergency Department (HOSPITAL_COMMUNITY): Payer: Self-pay

## 2018-06-13 ENCOUNTER — Other Ambulatory Visit: Payer: Self-pay

## 2018-06-13 DIAGNOSIS — M545 Low back pain, unspecified: Secondary | ICD-10-CM

## 2018-06-13 DIAGNOSIS — R1084 Generalized abdominal pain: Secondary | ICD-10-CM | POA: Insufficient documentation

## 2018-06-13 DIAGNOSIS — S0993XA Unspecified injury of face, initial encounter: Secondary | ICD-10-CM | POA: Insufficient documentation

## 2018-06-13 DIAGNOSIS — T7491XA Unspecified adult maltreatment, confirmed, initial encounter: Secondary | ICD-10-CM | POA: Insufficient documentation

## 2018-06-13 DIAGNOSIS — R292 Abnormal reflex: Secondary | ICD-10-CM | POA: Insufficient documentation

## 2018-06-13 DIAGNOSIS — Y929 Unspecified place or not applicable: Secondary | ICD-10-CM | POA: Insufficient documentation

## 2018-06-13 DIAGNOSIS — Y9389 Activity, other specified: Secondary | ICD-10-CM | POA: Insufficient documentation

## 2018-06-13 DIAGNOSIS — R109 Unspecified abdominal pain: Secondary | ICD-10-CM

## 2018-06-13 DIAGNOSIS — R253 Fasciculation: Secondary | ICD-10-CM

## 2018-06-13 DIAGNOSIS — Y998 Other external cause status: Secondary | ICD-10-CM | POA: Insufficient documentation

## 2018-06-13 LAB — COMPREHENSIVE METABOLIC PANEL
ALBUMIN: 3.8 g/dL (ref 3.5–5.0)
ALK PHOS: 58 U/L (ref 38–126)
ALT: 12 U/L (ref 0–44)
AST: 22 U/L (ref 15–41)
Anion gap: 11 (ref 5–15)
BUN: 7 mg/dL (ref 6–20)
CALCIUM: 8.8 mg/dL — AB (ref 8.9–10.3)
CO2: 22 mmol/L (ref 22–32)
CREATININE: 0.81 mg/dL (ref 0.44–1.00)
Chloride: 108 mmol/L (ref 98–111)
GFR calc Af Amer: >60 mL/min (ref >60)
GFR calc non Af Amer: >60 mL/min (ref >60)
GLUCOSE: 96 mg/dL (ref 70–99)
Potassium: 3.8 mmol/L (ref 3.5–5.1)
Sodium: 141 mmol/L (ref 135–145)
Total Bilirubin: 0.4 mg/dL (ref 0.3–1.2)
Total Protein: 6.6 g/dL (ref 6.5–8.1)

## 2018-06-13 LAB — CBC WITH DIFFERENTIAL/PLATELET
ABS IMMATURE GRANULOCYTES: 0 10*3/uL (ref 0.0–0.1)
Basophils Absolute: 0.1 10*3/uL (ref 0.0–0.1)
Basophils Relative: 1 %
Eosinophils Absolute: 0.2 10*3/uL (ref 0.0–0.7)
Eosinophils Relative: 2 %
HEMATOCRIT: 35.2 % — AB (ref 36.0–46.0)
HEMOGLOBIN: 11 g/dL — AB (ref 12.0–15.0)
IMMATURE GRANULOCYTES: 0 %
LYMPHS ABS: 1.8 10*3/uL (ref 0.7–4.0)
LYMPHS PCT: 30 %
MCH: 22.8 pg — ABNORMAL LOW (ref 26.0–34.0)
MCHC: 31.3 g/dL (ref 30.0–36.0)
MCV: 72.9 fL — ABNORMAL LOW (ref 78.0–100.0)
Monocytes Absolute: 1 10*3/uL (ref 0.1–1.0)
Monocytes Relative: 16 %
Neutro Abs: 3.1 10*3/uL (ref 1.7–7.7)
Neutrophils Relative %: 51 %
Platelets: 163 10*3/uL (ref 150–400)
RBC: 4.83 MIL/uL (ref 3.87–5.11)
RDW: 16.1 % — ABNORMAL HIGH (ref 11.5–15.5)
WBC: 6.2 10*3/uL (ref 4.0–10.5)

## 2018-06-13 LAB — URINALYSIS, ROUTINE W REFLEX MICROSCOPIC
Bilirubin Urine: NEGATIVE
Glucose, UA: NEGATIVE mg/dL
Ketones, ur: NEGATIVE mg/dL
Nitrite: NEGATIVE
PROTEIN: NEGATIVE mg/dL
RBC / HPF: >50 RBC/hpf — ABNORMAL HIGH (ref 0–5)
SPECIFIC GRAVITY, URINE: 1.009 (ref 1.005–1.030)
pH: 7 (ref 5.0–8.0)

## 2018-06-13 LAB — LIPASE, BLOOD: Lipase: 29 U/L (ref 11–51)

## 2018-06-13 LAB — I-STAT BETA HCG BLOOD, ED (MC, WL, AP ONLY): I-stat hCG, quantitative: <5 m[IU]/mL (ref <5)

## 2018-06-13 MED ORDER — KETOROLAC TROMETHAMINE 15 MG/ML IJ SOLN
15.0000 mg | Freq: Once | INTRAMUSCULAR | Status: AC
Start: 2018-06-13 — End: 2018-06-13
  Administered 2018-06-13: 15 mg via INTRAVENOUS
  Filled 2018-06-13: qty 1

## 2018-06-13 MED ORDER — ONDANSETRON HCL 4 MG/2ML IJ SOLN
4.0000 mg | Freq: Once | INTRAMUSCULAR | Status: AC
Start: 2018-06-13 — End: 2018-06-13
  Administered 2018-06-13: 4 mg via INTRAVENOUS
  Filled 2018-06-13: qty 2

## 2018-06-13 MED ORDER — SODIUM CHLORIDE 0.9 % IV BOLUS
1000.0000 mL | Freq: Once | INTRAVENOUS | Status: AC
Start: 2018-06-13 — End: 2018-06-13
  Administered 2018-06-13: 1000 mL via INTRAVENOUS
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
## 2018-06-13 NOTE — ED Triage Notes (Signed)
Pt was ambulating into ER entrance when staff witnessed pt fall to the ground. Pt with entire body twitching, nonverbal. A&O x4 after being brought back to room. Reports originally coming in to be evaluated for back pain and reports abd pain with nausea. Has been having frequent falls today with shaking at times
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
## 2018-06-13 NOTE — ED Provider Notes (Signed)
[facility] Provider Note  CSN: [id number] Arrival date & time:    Chief Complaint(s) Fall; Abdominal Pain; and Back Pain  HPI Cassidy Bruce is a 22 y.o. female with a history of ADHD and depression presents to the emergency department with 1 day of abdominal discomfort with associated nausea and emesis.  Patient also complaining of lower back pain similar to prior episodes.  Both abdominal and lower back pain exacerbated with movement.  No alleviating factors.  She denies any associated fevers or chills.  Denies any chest pain or shortness of breath.  No dysuria or frequency.  Patient is currently on her menstrual cycle.  Denies any vaginal discharge.  No bladder/bowel incontinence.  No lower extremity weakness or loss of sensation.  Patient reports that while at work she was having episodes of shaking causing her to fall.  However her pain preceded these episodes.  While walking into the emergency department patient had another episode of shaking causing her to fall.  She was immediately wheeled back.  Is was minimally responsive to verbal stimuli however responded quickly without postictal state.  During my evaluation patient was having myoclonic jerking that was intermittent and resolved when patient was distracted.  HPI  Past Medical History Past Medical History:  Diagnosis Date  . ADHD   . Depression    There are no active problems to display for this patient.  Home Medication(s) Prior to Admission medications   Medication Sig Start Date End Date Taking? Authorizing Provider  cephALEXin (KEFLEX) 500 MG capsule Take 1 capsule (500 mg total) by mouth 3 (three) times daily. Patient not taking: Reported on 06/13/2018 03/10/18   Horton, Mayer Maskerourtney F, MD  ibuprofen (ADVIL,MOTRIN) 400 MG tablet Take 1 tablet (400 mg total) by mouth every 8 (eight) hours as needed for up to 30 doses. Patient not taking: Reported on 03/03/2018 12/31/17   Jaynie CollinsAugustin, Wun, DO    metroNIDAZOLE (FLAGYL) 500 MG tablet Take 1 tablet (500 mg total) by mouth 2 (two) times daily. Patient not taking: Reported on 03/03/2018 11/26/17   Isa RankinMurray, Laura Wilson, MD                                                                                                                                    Past Surgical History History reviewed. No pertinent surgical history. Family History Family History  Problem Relation Age of Onset  . Cancer Other   . Diabetes Other   . CAD Other     Social History Social History   Tobacco Use  . Smoking status: Never Smoker  . Smokeless tobacco: Never Used  Substance Use Topics  . Alcohol use: No  . Drug use: No   Allergies Patient has no known allergies.  Review of Systems Review of Systems All other systems are reviewed and are negative for acute change except as noted in the HPI  Physical Exam Vital  Signs  I have reviewed the triage vital signs BP 102/64   Pulse (!) 54   Temp 99.1 F (37.3 C) (Oral)   Resp 16   LMP 06/12/2018   SpO2 99%   Physical Exam  Constitutional: She is oriented to person, place, and time. She appears well-developed and well-nourished. No distress.  HENT:  Head: Normocephalic and atraumatic.  Nose: Nose normal.  Eyes: Pupils are equal, round, and reactive to light. Conjunctivae and EOM are normal. Right eye exhibits no discharge. Left eye exhibits no discharge. No scleral icterus.  Neck: Normal range of motion. Neck supple.  Cardiovascular: Normal rate and regular rhythm. Exam reveals no gallop and no friction rub.  No murmur heard. Pulmonary/Chest: Effort normal and breath sounds normal. No stridor. No respiratory distress. She has no rales.  Abdominal: Soft. She exhibits no distension. There is generalized tenderness (discomfort). There is no rigidity, no rebound, no guarding, no tenderness at McBurney's point and negative Murphy's sign.  Musculoskeletal: She exhibits no edema.       Lumbar back: She  exhibits no tenderness, no bony tenderness, no pain and no spasm.  Neurological: She is alert and oriented to person, place, and time. She has normal strength. She displays no tremor. No sensory deficit. She exhibits normal muscle tone. Coordination normal.  Patient with intermittent total body jerking which resolves when patient is distracted  Skin: Skin is warm and dry. No rash noted. She is not diaphoretic. No erythema.  Psychiatric: She has a normal mood and affect.  Vitals reviewed.   ED Results and Treatments Labs (all labs ordered are listed, but only abnormal results are displayed) Labs Reviewed  CBC WITH DIFFERENTIAL/PLATELET - Abnormal; Notable for the following components:      Result Value   Hemoglobin 11.0 (*)    HCT 35.2 (*)    MCV 72.9 (*)    MCH 22.8 (*)    RDW 16.1 (*)    All other components within normal limits  COMPREHENSIVE METABOLIC PANEL - Abnormal; Notable for the following components:   Calcium 8.8 (*)    All other components within normal limits  URINALYSIS, ROUTINE W REFLEX MICROSCOPIC - Abnormal; Notable for the following components:   APPearance HAZY (*)    Hgb urine dipstick LARGE (*)    Leukocytes, UA TRACE (*)    RBC / HPF >50 (*)    Bacteria, UA RARE (*)    All other components within normal limits  URINE CULTURE  LIPASE, BLOOD  I-STAT BETA HCG BLOOD, ED ([facility], [facility], [facility] ONLY)                                                                                                                         EKG  EKG Interpretation  Date/Time:    Ventricular Rate:    PR Interval:    QRS Duration:   QT Interval:    QTC Calculation:   R Axis:     Text Interpretation:  Radiology No results found. Pertinent labs & imaging results that were available during my care of the patient were reviewed by me and considered in my medical decision making (see chart for details).  Medications Ordered in ED Medications  sodium chloride 0.9 % bolus 1,000 mL (0  mLs Intravenous Stopped 06/13/18 0502)  ondansetron (ZOFRAN) injection 4 mg (4 mg Intravenous Given 06/13/18 0359)  ketorolac (TORADOL) 15 MG/ML injection 15 mg (15 mg Intravenous Given 06/13/18 0558)                                                                                                                                    Procedures Procedures  (including critical care time)  Medical Decision Making / ED Course I have reviewed the nursing notes for this encounter and the patient's prior records (if available in EHR or on provided paperwork).    1. abd pain Diffuse abdominal discomfort without evidence of peritonitis.  Labs grossly reassuring without leukocytosis, significant electrolyte derangements, renal insufficiency, evidence of biliary obstruction, or pancreatitis.  Beta hCG negative.  UA contaminated and not overtly suspicious for urinary tract infection however given trace of leuks and WBCs, will send for culture.  Patient provided with IV Toradol, fluids, and antiemetics resulting in improved symptomatology.  2.  Lower back pain No tenderness to palpation.  No red flags or evidence of cauda equina requiring imaging at this time.  3.  Jerking Appeared to be voluntary in nature.  Low suspicion for epilepsy.    Final Clinical Impression(s) / ED Diagnoses Final diagnoses:  Abdominal discomfort  Acute bilateral low back pain without sciatica  Jerking    Disposition: Discharge  Condition: Good  I have discussed the results, Dx and Tx plan with the patient who expressed understanding and agree(s) with the plan. Discharge instructions discussed at great length. The patient was given strict return precautions who verbalized understanding of the instructions. No further questions at time of discharge.    ED Discharge Orders    None       Follow Up: Primary care provider   If you do not have a primary care physician, contact [facility] at [phone] for  referral     This chart was dictated using voice recognition software.  Despite best efforts to proofread,  errors can occur which can change the documentation meaning.   Nira Conn, MD 06/13/18 [phone]

## 2018-06-13 NOTE — Discharge Instructions (Addendum)

## 2018-06-14 LAB — URINE CULTURE: Culture: <10000 — AB

## 2018-06-14 MED ORDER — IBUPROFEN 100 MG/5ML PO SUSP
400.0000 mg | Freq: Once | ORAL | Status: AC
  Administered 2018-06-14: 400 mg via ORAL
  Filled 2018-06-14: qty 20

## 2018-07-24 ENCOUNTER — Emergency Department (HOSPITAL_COMMUNITY)
Admission: EM | Admit: 2018-07-24 | Discharge: 2018-07-25 | Disposition: A | Discharge status: Other Acute Inpatient Hospital | Payer: Self-pay | Attending: Emergency Medicine | Admitting: Emergency Medicine

## 2018-07-24 ENCOUNTER — Other Ambulatory Visit: Payer: Self-pay

## 2018-07-24 DIAGNOSIS — R45851 Suicidal ideations: Secondary | ICD-10-CM

## 2018-07-24 DIAGNOSIS — F419 Anxiety disorder, unspecified: Secondary | ICD-10-CM | POA: Insufficient documentation

## 2018-07-24 DIAGNOSIS — F322 Major depressive disorder, single episode, severe without psychotic features: Secondary | ICD-10-CM | POA: Insufficient documentation

## 2018-07-24 LAB — RAPID URINE DRUG SCREEN, HOSP PERFORMED
AMPHETAMINES: NOT DETECTED
BARBITURATES: NOT DETECTED
Benzodiazepines: NOT DETECTED
Cocaine: NOT DETECTED
Opiates: NOT DETECTED
TETRAHYDROCANNABINOL: NOT DETECTED

## 2018-07-24 LAB — COMPREHENSIVE METABOLIC PANEL
ALK PHOS: 70 U/L (ref 38–126)
ALT: 13 U/L (ref 0–44)
AST: 24 U/L (ref 15–41)
Albumin: 4.8 g/dL (ref 3.5–5.0)
Anion gap: 11 (ref 5–15)
BUN: 12 mg/dL (ref 6–20)
CALCIUM: 9.8 mg/dL (ref 8.9–10.3)
CHLORIDE: 108 mmol/L (ref 98–111)
CO2: 24 mmol/L (ref 22–32)
CREATININE: 0.89 mg/dL (ref 0.44–1.00)
Glucose, Bld: 96 mg/dL (ref 70–99)
Potassium: 3.8 mmol/L (ref 3.5–5.1)
SODIUM: 143 mmol/L (ref 135–145)
Total Bilirubin: 0.7 mg/dL (ref 0.3–1.2)
Total Protein: 8.3 g/dL — ABNORMAL HIGH (ref 6.5–8.1)

## 2018-07-24 LAB — CBC
HCT: 38.9 % (ref 36.0–46.0)
HEMOGLOBIN: 12.9 g/dL (ref 12.0–15.0)
MCH: 23.4 pg — AB (ref 26.0–34.0)
MCHC: 33.2 g/dL (ref 30.0–36.0)
MCV: 70.6 fL — AB (ref 78.0–100.0)
Platelets: 184 10*3/uL (ref 150–400)
RBC: 5.51 MIL/uL — AB (ref 3.87–5.11)
RDW: 16 % — ABNORMAL HIGH (ref 11.5–15.5)
WBC: 7.7 10*3/uL (ref 4.0–10.5)

## 2018-07-24 LAB — SALICYLATE LEVEL: Salicylate Lvl: <7 mg/dL (ref 2.8–30.0)

## 2018-07-24 LAB — ACETAMINOPHEN LEVEL: Acetaminophen (Tylenol), Serum: <10 ug/mL — ABNORMAL LOW (ref 10–30)

## 2018-07-24 LAB — I-STAT BETA HCG BLOOD, ED (MC, WL, AP ONLY): I-stat hCG, quantitative: <5 m[IU]/mL (ref <5)

## 2018-07-24 LAB — ETHANOL: Alcohol, Ethyl (B): <10 mg/dL (ref <10)

## 2018-07-24 NOTE — ED Notes (Signed)
Pt under IVC, presents for eval after sitting on ledge of parking deck on 2nd floor.  Pt denies it was a SI attempt, stating she was in the wrong place at the wrong time. Stating she is happy and would not take her life.  A&O x 3, no distress noted, calm & cooperative, denies HI or AVH, denies drug or alcohol abuse.  Monitoring for safety, Q 15 min checks in effect.  Pt admits to history of ADHD, noncompliant with meds.

## 2018-07-24 NOTE — Progress Notes (Signed)
[facility] to Dr. Jola Babinskilary, MD. Call to report [phone]. ED staff aware.   Cassidy Bruce, MSW, LCSW Therapeutic Triage Specialist  [phone]

## 2018-07-24 NOTE — ED Notes (Signed)
[facility] Expected date:  Expected time:  Means of arrival:  Comments: Bed 25

## 2018-07-24 NOTE — ED Notes (Signed)
Bed: [id number] Expected date:  Expected time:  Means of arrival:  Comments: SI attempt

## 2018-07-24 NOTE — Progress Notes (Signed)
Per Donell SievertSpencer Simon, PA pt is recommended for inpt treatment. TTS to seek placement. EDP Dr. Silverio LayYao, MD and pt's nurse Ronnell FreshwaterLondon, Latricia L, RN have been advised.   Princess BruinsAquicha Milda Lindvall, MSW, LCSW Therapeutic Triage Specialist  [phone]

## 2018-07-24 NOTE — ED Triage Notes (Signed)
Patient brought in by [employer]. Reported to be sitting on the 2nd floor of [address] parking deck with feet dangling off the side. [employer] states citizens were able to talk patient down from side of parking deck. IVC being obtained.
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
## 2018-07-25 ENCOUNTER — Inpatient Hospital Stay (HOSPITAL_COMMUNITY)
Admission: AD | Admit: 2018-07-25 | Discharge: 2018-07-26 | DRG: 885 | Disposition: A | Discharge status: Home / Self Care | Payer: Federal, State, Local not specified - Other | Source: Intra-hospital | Attending: Psychiatry | Admitting: Psychiatry

## 2018-07-25 ENCOUNTER — Encounter (HOSPITAL_COMMUNITY): Payer: Self-pay

## 2018-07-25 DIAGNOSIS — F909 Attention-deficit hyperactivity disorder, unspecified type: Secondary | ICD-10-CM | POA: Diagnosis present

## 2018-07-25 DIAGNOSIS — F4323 Adjustment disorder with mixed anxiety and depressed mood: Secondary | ICD-10-CM | POA: Diagnosis not present

## 2018-07-25 DIAGNOSIS — F322 Major depressive disorder, single episode, severe without psychotic features: Secondary | ICD-10-CM | POA: Diagnosis present

## 2018-07-25 DIAGNOSIS — F331 Major depressive disorder, recurrent, moderate: Secondary | ICD-10-CM | POA: Diagnosis present

## 2018-07-25 DIAGNOSIS — F332 Major depressive disorder, recurrent severe without psychotic features: Secondary | ICD-10-CM | POA: Diagnosis present

## 2018-07-25 DIAGNOSIS — R45851 Suicidal ideations: Secondary | ICD-10-CM | POA: Diagnosis present

## 2018-07-25 MED ORDER — ACETAMINOPHEN 325 MG PO TABS: 650.0000 mg | ORAL_TABLET | Freq: Four times a day (QID) | ORAL | Status: DC | PRN

## 2018-07-25 MED ORDER — HYDROXYZINE HCL 25 MG PO TABS
25.0000 mg | ORAL_TABLET | Freq: Four times a day (QID) | ORAL | Status: DC | PRN
  Filled 2018-07-25: qty 10

## 2018-07-25 MED ORDER — ALUM & MAG HYDROXIDE-SIMETH 200-200-20 MG/5ML PO SUSP: 30.0000 mL | ORAL | Status: DC | PRN

## 2018-07-25 MED ORDER — MAGNESIUM HYDROXIDE 400 MG/5ML PO SUSP: 30.0000 mL | Freq: Every day | ORAL | Status: DC | PRN

## 2018-07-25 MED ORDER — TRAZODONE HCL 50 MG PO TABS
50.0000 mg | ORAL_TABLET | Freq: Every evening | ORAL | Status: DC | PRN
  Filled 2018-07-25: qty 1
  Filled 2018-07-25 (×2): qty 14
  Filled 2018-07-25 (×3): qty 1
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
## 2018-07-25 NOTE — [facility] Group Notes (Signed)
Adult Psychoeducational Group Note  Date:  07/25/2018 Time:  1315  Group Topic/Focus: Love Languages Healthy Communication:   The focus of this group is to discuss communication, barriers to communication, as well as healthy ways to communicate with others.  Participation Level:  Active  Participation Quality:  Attentive and Sharing  Affect:  Appropriate  Cognitive:  Appropriate  Insight: Appropriate  Engagement in Group:  Developing/Improving and Engaged  Modes of Intervention:  Discussion, Education, Exploration and Socialization  Additional Comments:  Patient commented that her love language is physical touch.   Jonathon BellowsJennifer M Kameryn Davern 07/25/2018, 2:49 PM

## 2018-07-25 NOTE — Progress Notes (Signed)
Patient ID: Cassidy Bruce, female   DOB: [birth year], 22 y.o.   MRN: [id number]  Admission Note:  D:21 yr female who presents IVC in no acute distress for the treatment of SI and Depression. Pt appears flat and depressed. Pt was calm and cooperative with admission process. Pt denies SI/HI/AVH. Pt stated she was never suicidal and goes up on the parking deck before work . Pt stated she needed to go because she has to pay bills.    A:Skin was assessed Shirlee Limerick([place]- RN) and found to be clear of any abnormal marks . PT searched and no contraband found, POC and unit policies explained and understanding verbalized. Consents obtained. Food and fluids offered, and fluids accepted. Pt walked on the unit and pt taken to breakfast.   R: Pt had no additional questions or concerns.

## 2018-07-25 NOTE — Progress Notes (Signed)
Pt reported that one of her friend's Jerilynn Som(Calvin) were told by one of [facility] hosp staff that [facility] and the floor that she was transferred to. Pt wrote "information about my where about where given out by hospital without my permission". Writer informed Kenney HousemanLindsay, AC., of pt;s complaint. Lillia AbedLindsay spoke with pt and pt declined to file the complaint.

## 2018-07-25 NOTE — ED Notes (Signed)
[facility] Kath[facility] NovemberMike, [facility]. [employer] [facility] requested.

## 2018-07-25 NOTE — Tx Team (Signed)
Initial Treatment Plan 07/25/2018 6:46 AM Cassidy FloorAshley Bruce WGN:[id number]    PATIENT STRESSORS: Financial difficulties Occupational concerns   PATIENT STRENGTHS: Average or above average intelligence General fund of knowledge Supportive family/friends   PATIENT IDENTIFIED PROBLEMS: Risk for suicide  depression  "nothing is wrong with me, I'm calm "                 DISCHARGE CRITERIA:  Improved stabilization in mood, thinking, and/or behavior Verbal commitment to aftercare and medication compliance  PRELIMINARY DISCHARGE PLAN: Attend aftercare/continuing care group Outpatient therapy  PATIENT/FAMILY INVOLVEMENT: This treatment plan has been presented to and reviewed with the patient, Cassidy Bruce.  The patient and family have been given the opportunity to ask questions and make suggestions.  Delos HaringPhillips, Cassidy Parkhurst A, RN 07/25/2018, 6:46 AM

## 2018-07-25 NOTE — [facility] Group Notes (Signed)
[facility] LCSW Group Therapy Note  07/25/2018  9:00-10:00AM  Type of Therapy and Topic:  Group Therapy:  Being Your Own Support  Participation Level:  Minimal   Description of Group:  Patients in this group were introduced to the concept that self-support is an essential part of recovery.  A song entitled "My Own Hero" was played and a group discussion ensued in which patients stated they could relate to the song and it inspired them to realize they have be willing to help themselves in order to succeed, because other people cannot achieve sobriety or stability for them.  We discussed adding a variety of healthy supports to address the various needs in their lives.  A song was played called "I Know Where I've Been" toward the end of group and used to conduct an inspirational wrap-up to group of remembering how far they have already come in their journey.  Therapeutic Goals: 1)  demonstrate the importance of being a part of one's own support system 2)  discuss reasons people in one's life may eventually be unable to be continually supportive  3)  identify the patient's current support system and   4)  elicit commitments to add healthy supports and to become more conscious of being self-supportive   Summary of Patient Progress:  The patient expressed that her roommate and a few friends are healthy supports while her foster parents are unhealthy for her.  She was absent for much of group due to being with a provider.   Therapeutic Modalities:   Motivational Interviewing Activity  Lynnell ChadMareida J Grossman-Orr

## 2018-07-25 NOTE — [facility] Group Notes (Signed)
Adult Psychoeducational Group Note  Date:  07/25/2018 Time:  9:09 AM  Group Topic/Focus:  Goals Group:   The focus of this group is to help patients establish daily goals to achieve during treatment and discuss how the patient can incorporate goal setting into their daily lives to aide in recovery.  Participation Level:  Active  Participation Quality:  Appropriate  Affect:  Appropriate  Cognitive:  Alert  Insight: Appropriate  Engagement in Group:  Engaged  Modes of Intervention:  Orientation  Additional Comments:  Pt participated in goals/psycho-ed group session.  Dellia NimsJaquesha M Chealsea Paske 07/25/2018, 9:09 AM
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
## 2018-07-25 NOTE — Progress Notes (Signed)
Pt presents with a flat affect and a labile mood. Pt noted to be crying uncontrollably in the dayroom, drawing attention to herself. Pt expressed that she was feeling home sick and missed her significant other. Verbal support was provided to pt by another nurse on the unit. During shift assessment, pt expressed that she was brought into the hosp after someone called 911 because she was sitting on the edge of the parking deck. Pt denies that it was a suicide attempt. Pt verbalized that she goes to the parking deck to sit and listen to music before going to work. Writer asked if anyone knows about her going to the parking deck to listen to music and pt stated no. Pt expressed that she was started on depression meds four years ago when her father died but stopped taking her meds. Pt appears to be attention seeking and constantly changing her stories after speak with several staff members for support during her labile episodes   Orders reviewed. Verbal support provided. Pt encouraged to attend groups. 15 minute checks performed for safety.   Pt compliant with taking meds.

## 2018-07-26 MED ORDER — TRAZODONE HCL 50 MG PO TABS: 50.0000 mg | ORAL_TABLET | Freq: Every evening | ORAL | 0 refills | Status: DC | PRN

## 2018-07-26 MED ORDER — HYDROXYZINE HCL 25 MG PO TABS: 25.0000 mg | ORAL_TABLET | Freq: Four times a day (QID) | ORAL | 0 refills | Status: DC | PRN

## 2018-07-26 NOTE — Progress Notes (Signed)
  [facility] :  Will you be returning to the same living situation after discharge:  Yes,  own home At discharge, do you have transportation home?: Yes,  bus Do you have the ability to pay for your medications: No. [facility].  Release of information consent forms completed and in the chart;  Patient's signature needed at discharge.  Patient to Follow up at: Follow-up Information    [facility] [employer] Go in 3 day(s).   Specialty:  Professional Counselor Why:  Please attend a walk in appt Monday-Friday between 830am-12noon or between 1pm-230pm.  Please request therapy and medication management services. Contact information: [facility] [address] [place] [address] [phone]        [facility]. Call.   Why:  Please contact [facility] and inquire about a new patient primary care MD appt. Contact information:  [address],  [place], [place] [address] P: [phone]          Next level of care provider has access to [facility] Link:no  Safety Planning and Suicide Prevention discussed: No. Pt declined consent.   Have you used any form of tobacco in the last 30 days? (Cigarettes, Smokeless Tobacco, Cigars, and/or Pipes): No  Has patient been referred to the Quitline?: N/A patient is not a smoker  Patient has been referred for addiction treatment: N/A  Lorri FrederickWierda, Tee Richeson Jon, LCSW 07/26/2018, 11:08 AM

## 2018-07-26 NOTE — Progress Notes (Signed)
Patient discharged to lobby. Patient was stable and appreciative at that time. All papers, samples and prescriptions were given and valuables returned. Verbal understanding expressed. Denies SI/HI and A/VH. Patient given opportunity to express concerns and ask questions.  

## 2018-07-26 NOTE — BHH Suicide Risk Assessment (Signed)
[facility]   Principal Problem: <principal problem not specified> Discharge Diagnoses:  Patient Active Problem List   Diagnosis Date Noted  . MDD (major depressive disorder), recurrent episode, severe (HCC) [F33.2] 07/25/2018  . Adjustment disorder with mixed anxiety and depressed mood [F43.23]     Total Time spent with patient: 15 minutes  Musculoskeletal: Strength & Muscle Tone: within normal limits Gait & Station: normal Patient leans: N/A  Psychiatric Specialty Exam: Review of Systems  All other systems reviewed and are negative.   Blood pressure 113/84, pulse 86, temperature 98.3 F (36.8 C), temperature source Oral, resp. rate 16, height 5\' 4"  (1.626 m), weight 48.5 kg, last menstrual period 07/05/2018.Body mass index is 18.37 kg/m.  General Appearance: Casual  Eye Contact::  Fair  Speech:  Normal Rate409  Volume:  Normal  Mood:  Euthymic  Affect:  Congruent  Thought Process:  Coherent and Descriptions of Associations: Intact  Orientation:  Full (Time, Place, and Person)  Thought Content:  Logical  Suicidal Thoughts:  No  Homicidal Thoughts:  No  Memory:  Immediate;   Fair Recent;   Fair Remote;   Fair  Judgement:  Intact  Insight:  Fair  Psychomotor Activity:  Normal  Concentration:  Fair  Recall:  Good  Fund of Knowledge:Good  Language: Good  Akathisia:  Negative  Handed:  Right  AIMS (if indicated):     Assets:  Communication Skills Desire for Improvement Housing Physical Health Resilience Social Support Talents/Skills  Sleep:  Number of Hours: 6.75  Cognition: WNL  ADL's:  Intact   Mental Status Per Nursing Assessment::   On Admission:  Suicide plan, Suicidal ideation indicated by patient  Demographic Factors:  Adolescent or young adult  Loss Factors: NA  Historical Factors: Impulsivity  Risk Reduction Factors:   Responsible for children under 22 years of age, Sense of responsibility to family, Employed and  Living with another person, especially a relative  Continued Clinical Symptoms:  Depression:   Impulsivity  Cognitive Features That Contribute To Risk:  None    Suicide Risk:  Minimal: No identifiable suicidal ideation.  Patients presenting with no risk factors but with morbid ruminations; may be classified as minimal risk based on the severity of the depressive symptoms    Plan Of Care/Follow-up recommendations:  Activity:  ad lib  [name], MD 07/26/2018, 7:40 AM
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
## 2018-07-26 NOTE — BHH Suicide Risk Assessment (Signed)
BHH INPATIENT:  Family/Significant Other Suicide Prevention Education  Suicide Prevention Education:  Patient Refusal for Family/Significant Other Suicide Prevention Education: The patient Cassidy Bruce has refused to provide written consent for family/significant other to be provided Family/Significant Other Suicide Prevention Education during admission and/or prior to discharge.  Physician notified.  Lorri FrederickWierda, Mariajose Mow Jon, LCSW 07/26/2018, 10:34 AM

## 2018-07-26 NOTE — Progress Notes (Signed)
Adult Psychoeducational Group Note  Date:  07/26/2018 Time:  8:35 AM  Group Topic/Focus:  Wrap-Up Group:   The focus of this group is to help patients review their daily goal of treatment and discuss progress on daily workbooks.  Participation Level:  Active  Participation Quality:  Appropriate  Affect:  Appropriate  Cognitive:  Appropriate  Insight: Appropriate  Engagement in Group:  Engaged  Modes of Intervention:  Discussion  Additional Comments:  Her day was a 8. Her goal she's living.  Charna Busmanobinson, Ariya Bohannon Long 07/26/2018, 8:35 AM

## 2018-07-26 NOTE — BHH Group Notes (Signed)
Adult Psychoeducational Group Note  Date:  07/26/2018 Time:  9:03 AM  Group Topic/Focus:  Goals Group:   The focus of this group is to help patients establish daily goals to achieve during treatment and discuss how the patient can incorporate goal setting into their daily lives to aide in recovery.  Participation Level:  Active  Participation Quality:  Appropriate  Affect:  Appropriate  Cognitive:  Alert  Insight: Appropriate  Engagement in Group:  Engaged  Modes of Intervention:  Orientation  Additional Comments:   Pt attended orientation/goals group facilitated by MHT MacDilla  Dellia NimsJaquesha M Malika Demario 07/26/2018, 9:03 AM

## 2018-07-26 NOTE — Progress Notes (Signed)
Recreation Therapy Notes  Date: 9.23.19 Time: 0930 Location: [address] Dayroom  Group Topic: Stress Management  Goal Area(s) Addresses:  Patient will verbalize importance of using healthy stress management.  Patient will identify positive emotions associated with healthy stress management.   Intervention: Stress Management  Activity :  Guided Imagery.  LRT introduced the stress management technique of guided imagery.  LRT read a script to guide patients on a journey to their peaceful place.  Patients were to follow along as script was read.  Education:  Stress Management, Discharge Planning.   Education Outcome: Acknowledges edcuation/In group clarification offered/Needs additional education  Clinical Observations/Feedback: Pt did not attend group.      Caroll RancherMarjette Zema Lizardo, LRT/CTRS         Caroll RancherLindsay, Sabin Gibeault A 07/26/2018 12:18 PM

## 2018-07-26 NOTE — [facility] Counselor (Signed)
No PSA completed as pt was on the unit for only 24 hours. Garner NashGregory Laverle Pillard, MSW, LCSW Clinical Social Worker 07/26/2018 10:33 AM

## 2018-07-26 NOTE — Tx Team (Signed)
Interdisciplinary Treatment and Diagnostic Plan Update  07/26/2018 Time of Session: 9:50am Cassidy Bruce MRN: [id number]  Principal Diagnosis: <principal problem not specified>  Secondary Diagnoses: Active Problems:   MDD (major depressive disorder), recurrent episode, severe (HCC)   Adjustment disorder with mixed anxiety and depressed mood   Current Medications:  Current Facility-Administered Medications  Medication Dose Route Frequency Provider Last Rate Last Dose  . acetaminophen (TYLENOL) tablet 650 mg  650 mg Oral Q6H PRN Kerry Hough, PA-C      . alum & mag hydroxide-simeth (MAALOX/MYLANTA) 200-200-20 MG/5ML suspension 30 mL  30 mL Oral Q4H PRN Kerry Hough, PA-C      . hydrOXYzine (ATARAX/VISTARIL) tablet 25 mg  25 mg Oral Q6H PRN Donell Sievert E, PA-C      . magnesium hydroxide (MILK OF MAGNESIA) suspension 30 mL  30 mL Oral Daily PRN Kerry Hough, PA-C      . traZODone (DESYREL) tablet 50 mg  50 mg Oral QHS,MR X 1 Simon, [name] E, PA-C       PTA Medications: Medications Prior to Admission  Medication Sig Dispense Refill Last Dose  . Amphetamine-Dextroamphetamine (ADDERALL PO) Take 1 tablet by mouth 2 (two) times daily.   07/24/2018 at Unknown time    Patient Stressors: Financial difficulties Occupational concerns  Patient Strengths: Average or above average intelligence General fund of knowledge Supportive family/friends  Treatment Modalities: Medication Management, Group therapy, Case management,  1 to 1 session with clinician, Psychoeducation, Recreational therapy.   Physician Treatment Plan for Primary Diagnosis: <principal problem not specified> Long Term Goal(s): Improvement in symptoms so as ready for discharge Improvement in symptoms so as ready for discharge   Short Term Goals: Ability to identify changes in lifestyle to reduce recurrence of condition will improve Ability to verbalize feelings will improve Ability to disclose and discuss  suicidal ideas Ability to demonstrate self-control will improve Ability to identify and develop effective coping behaviors will improve Ability to maintain clinical measurements within normal limits will improve Ability to identify changes in lifestyle to reduce recurrence of condition will improve Ability to verbalize feelings will improve Ability to disclose and discuss suicidal ideas Ability to demonstrate self-control will improve Ability to identify and develop effective coping behaviors will improve Ability to maintain clinical measurements within normal limits will improve  Medication Management: Evaluate patient's response, side effects, and tolerance of medication regimen.  Therapeutic Interventions: 1 to 1 sessions, Unit Group sessions and Medication administration.  Evaluation of Outcomes: Adequate for Discharge  Physician Treatment Plan for Secondary Diagnosis: Active Problems:   MDD (major depressive disorder), recurrent episode, severe (HCC)   Adjustment disorder with mixed anxiety and depressed mood  Long Term Goal(s): Improvement in symptoms so as ready for discharge Improvement in symptoms so as ready for discharge   Short Term Goals: Ability to identify changes in lifestyle to reduce recurrence of condition will improve Ability to verbalize feelings will improve Ability to disclose and discuss suicidal ideas Ability to demonstrate self-control will improve Ability to identify and develop effective coping behaviors will improve Ability to maintain clinical measurements within normal limits will improve Ability to identify changes in lifestyle to reduce recurrence of condition will improve Ability to verbalize feelings will improve Ability to disclose and discuss suicidal ideas Ability to demonstrate self-control will improve Ability to identify and develop effective coping behaviors will improve Ability to maintain clinical measurements within normal limits will  improve     Medication Management: Evaluate patient's response, side  effects, and tolerance of medication regimen.  Therapeutic Interventions: 1 to 1 sessions, Unit Group sessions and Medication administration.  Evaluation of Outcomes: Adequate for Discharge   RN Treatment Plan for Primary Diagnosis: <principal problem not specified> Long Term Goal(s): Knowledge of disease and therapeutic regimen to maintain health will improve  Short Term Goals: Ability to participate in decision making will improve, Ability to identify and develop effective coping behaviors will improve and Compliance with prescribed medications will improve  Medication Management: RN will administer medications as ordered by provider, will assess and evaluate patient's response and provide education to patient for prescribed medication. RN will report any adverse and/or side effects to prescribing provider.  Therapeutic Interventions: 1 on 1 counseling sessions, Psychoeducation, Medication administration, Evaluate responses to treatment, Monitor vital signs and CBGs as ordered, Perform/monitor CIWA, COWS, AIMS and Fall Risk screenings as ordered, Perform wound care treatments as ordered.  Evaluation of Outcomes: Adequate for Discharge   LCSW Treatment Plan for Primary Diagnosis: <principal problem not specified> Long Term Goal(s): Safe transition to appropriate next level of care at discharge, Engage patient in therapeutic group addressing interpersonal concerns.  Short Term Goals: Engage patient in aftercare planning with referrals and resources  Therapeutic Interventions: Assess for all discharge needs, 1 to 1 time with Social worker, Explore available resources and support systems, Assess for adequacy in community support network, Educate family and significant other(s) on suicide prevention, Complete Psychosocial Assessment, Interpersonal group therapy.  Evaluation of Outcomes: Adequate for Discharge   Progress  in Treatment: Attending groups: Yes. Participating in groups: Yes. Taking medication as prescribed: Yes. Toleration medication: Yes. Family/Significant other contact made: No, will contact:  if patient consents Patient understands diagnosis: Yes. Discussing patient identified problems/goals with staff: Yes. Medical problems stabilized or resolved: Yes. Denies suicidal/homicidal ideation: Yes. Issues/concerns per patient self-inventory: No. Other:   New problem(s) identified: None  New Short Term/Long Term Goal(s):medication stabilization, elimination of SI thoughts, development of comprehensive mental wellness plan.    Patient Goals: "I learned a lot of stuff and better coping skills"  Discharge Plan or Barriers: Patient is discharging home and [facility].   Reason for Continuation of Hospitalization: None  Estimated Length of Stay: Discharge, 07/26/18  Attendees: Patient: Cassidy Bruce 07/26/2018 8:52 AM  Physician: Dr. Landry MellowGreg Clary, MD 07/26/2018 8:52 AM  Nursing: Lanora ManisElizabeth, RN 07/26/2018 8:52 AM  RN Care Manager: Juliann ParesX 07/26/2018 8:52 AM  Social Worker: Baldo DaubJolan Mishael Krysiak, LCSWA 07/26/2018 8:52 AM  Recreational Therapist: Juliann ParesX 07/26/2018 8:52 AM  Other: X 07/26/2018 8:52 AM  Other: X 07/26/2018 8:52 AM  Other:X 07/26/2018 8:52 AM    Scribe for Treatment Team: Maeola SarahJolan E Shifra Swartzentruber, LCSWA 07/26/2018 8:52 AM

## 2018-07-26 NOTE — Progress Notes (Signed)
Writer has spoken with patient 1:1 after the mht on the hallway brought to my attention that she was in the dayroom crying. Writer went to speak to her 1:1 and she reported that she was home sick and was not supposed to be here. Writer spoke with her about how the situation looked and she will be able to speak with the doctor about this. She reports that she needs to get out because she has a 22 yr old child. Writer encouraged her to speak with doctor concerning discharge. Support given and safety maintained on unit with 15 min checks.

## 2018-07-27 NOTE — ED Provider Notes (Signed)
[facility]-EMERGENCY DEPT Provider Note   CSN: [id number] Arrival date & time: 07/24/18  1754     History   Chief Complaint Chief Complaint  Patient presents with  . Suicidal    HPI Cassidy Bruce is a 22 y.o. female.  HPI Patient presents to the emergency department in police custody after being found sitting on the ledge of parking deck.  The patient states she was just trying to sit there and enjoyed the evening.  The patient will give me any other information.  Patient states she is not suicidal.  Patient will not answer any further questions. Past Medical History:  Diagnosis Date  . ADHD   . Depression     Patient Active Problem List   Diagnosis Date Noted  . MDD (major depressive disorder), recurrent episode, severe (HCC) 07/25/2018  . Adjustment disorder with mixed anxiety and depressed mood     No past surgical history on file.   OB History   None      Home Medications    Prior to Admission medications   Medication Sig Start Date End Date Taking? Authorizing Provider  hydrOXYzine (ATARAX/VISTARIL) 25 MG tablet Take 1 tablet (25 mg total) by mouth every 6 (six) hours as needed for anxiety. 07/26/18   Oneta Rack, NP  traZODone (DESYREL) 50 MG tablet Take 1 tablet (50 mg total) by mouth at bedtime and may repeat dose one time if needed. 07/26/18   Oneta Rack, NP    Family History Family History  Problem Relation Age of Onset  . Cancer Other   . Diabetes Other   . CAD Other     Social History Social History   Tobacco Use  . Smoking status: Never Smoker  . Smokeless tobacco: Never Used  Substance Use Topics  . Alcohol use: No  . Drug use: No     Allergies   Patient has no known allergies.   Review of Systems Review of Systems Level 5 caveat applies due to uncooperativeness  Physical Exam Updated Vital Signs BP 127/85 (BP Location: Right Arm)   Pulse 76   Temp 98 F (36.7 C) (Oral)   Resp 18   Ht 5\' 5"   (1.651 m)   Wt 49.9 kg   LMP 07/05/2018 (Approximate)   SpO2 99%   BMI 18.30 kg/m   Physical Exam  Constitutional: She is oriented to person, place, and time. She appears well-developed and well-nourished. No distress.  HENT:  Head: Normocephalic and atraumatic.  Mouth/Throat: Oropharynx is clear and moist.  Eyes: Pupils are equal, round, and reactive to light.  Neck: Normal range of motion. Neck supple.  Cardiovascular: Normal rate, regular rhythm and normal heart sounds. Exam reveals no gallop and no friction rub.  No murmur heard. Pulmonary/Chest: Effort normal and breath sounds normal. No respiratory distress. She has no wheezes.  Abdominal: Soft. Bowel sounds are normal. She exhibits no distension. There is no tenderness.  Neurological: She is alert and oriented to person, place, and time. She exhibits normal muscle tone. Coordination normal.  Skin: Skin is warm and dry. Capillary refill takes less than 2 seconds. No rash noted. No erythema.  Psychiatric: She has a normal mood and affect. Her behavior is normal. Thought content normal.  Nursing note and vitals reviewed.    ED Treatments / Results  Labs (all labs ordered are listed, but only abnormal results are displayed) Labs Reviewed  COMPREHENSIVE METABOLIC PANEL - Abnormal; Notable for the following components:  Result Value   Total Protein 8.3 (*)    All other components within normal limits  ACETAMINOPHEN LEVEL - Abnormal; Notable for the following components:   Acetaminophen (Tylenol), Serum <10 (*)    All other components within normal limits  CBC - Abnormal; Notable for the following components:   RBC 5.51 (*)    MCV 70.6 (*)    MCH 23.4 (*)    RDW 16.0 (*)    All other components within normal limits  ETHANOL  SALICYLATE LEVEL  RAPID URINE DRUG SCREEN, HOSP PERFORMED  I-STAT BETA HCG BLOOD, ED ([facility], [facility], [facility] ONLY)    EKG None  Radiology No results found.  Procedures Procedures (including critical  care time)  Medications Ordered in ED Medications - No data to display   Initial Impression / Assessment and Plan / ED Course  I have reviewed the triage vital signs and the nursing notes.  Pertinent labs & imaging results that were available during my care of the patient were reviewed by me and considered in my medical decision making (see chart for details).     Patient will need psych evaluation for her actions this evening.  The patient has been stable otherwise.  Final Clinical Impressions(s) / ED Diagnoses   Final diagnoses:  Suicidal ideation    ED Discharge Orders    None       Charlestine NightLawyer, Lisandra Mathisen, PA-C 07/27/18 [id number]    Vanetta MuldersZackowski, Scott, MD 07/28/18 1256

## 2018-08-09 ENCOUNTER — Encounter: Payer: Self-pay | Admitting: Advanced Practice Midwife

## 2018-08-16 ENCOUNTER — Emergency Department (HOSPITAL_COMMUNITY)
Admission: EM | Admit: 2018-08-16 | Discharge: 2018-08-16 | Disposition: A | Discharge status: Home / Self Care | Payer: Self-pay | Attending: Emergency Medicine | Admitting: Emergency Medicine

## 2018-08-16 ENCOUNTER — Encounter (HOSPITAL_COMMUNITY): Payer: Self-pay

## 2018-08-16 DIAGNOSIS — N3001 Acute cystitis with hematuria: Secondary | ICD-10-CM | POA: Insufficient documentation

## 2018-08-16 DIAGNOSIS — Z79899 Other long term (current) drug therapy: Secondary | ICD-10-CM | POA: Insufficient documentation

## 2018-08-16 LAB — URINALYSIS, ROUTINE W REFLEX MICROSCOPIC
BILIRUBIN URINE: NEGATIVE
Bacteria, UA: NONE SEEN
GLUCOSE, UA: NEGATIVE mg/dL
Ketones, ur: NEGATIVE mg/dL
NITRITE: NEGATIVE
PH: 6 (ref 5.0–8.0)
Protein, ur: >=300 mg/dL — AB
RBC / HPF: >50 RBC/hpf — ABNORMAL HIGH (ref 0–5)
SPECIFIC GRAVITY, URINE: 1.017 (ref 1.005–1.030)
WBC, UA: >50 WBC/hpf — ABNORMAL HIGH (ref 0–5)

## 2018-08-16 LAB — PREGNANCY, URINE: Preg Test, Ur: NEGATIVE

## 2018-08-16 MED ORDER — CEPHALEXIN 500 MG PO CAPS
500.0000 mg | ORAL_CAPSULE | Freq: Once | ORAL | Status: AC
  Administered 2018-08-16: 500 mg via ORAL
  Filled 2018-08-16: qty 1

## 2018-08-16 MED ORDER — PHENAZOPYRIDINE HCL 200 MG PO TABS
200.0000 mg | ORAL_TABLET | Freq: Three times a day (TID) | ORAL | Status: AC
  Administered 2018-08-16: 200 mg via ORAL

## 2018-08-16 MED ORDER — PHENAZOPYRIDINE HCL 200 MG PO TABS
200.0000 mg | ORAL_TABLET | Freq: Three times a day (TID) | ORAL | Status: DC
  Filled 2018-08-16: qty 1

## 2018-08-16 MED ORDER — CEPHALEXIN 500 MG PO CAPS: 500.0000 mg | ORAL_CAPSULE | Freq: Two times a day (BID) | ORAL | 0 refills | Status: DC

## 2018-08-16 MED ORDER — IBUPROFEN 800 MG PO TABS
800.0000 mg | ORAL_TABLET | Freq: Once | ORAL | Status: AC
  Administered 2018-08-16: 800 mg via ORAL
  Filled 2018-08-16: qty 1

## 2018-08-16 MED ORDER — PHENAZOPYRIDINE HCL 200 MG PO TABS: 200.0000 mg | ORAL_TABLET | Freq: Three times a day (TID) | ORAL | 0 refills | Status: DC

## 2018-08-16 NOTE — Discharge Instructions (Addendum)
Take the antibiotic prescribed to you until finished.  You have been prescribed Pyridium to assist with improving bladder spasms and pain.  You may take 600 mg ibuprofen every 6 hours as needed for management of persistent discomfort.  Follow-up with a primary care doctor to ensure resolution of symptoms.

## 2018-08-16 NOTE — ED Notes (Signed)
Bed: WA20 Expected date:  Expected time:  Means of arrival:  Comments: abd pain 

## 2018-08-16 NOTE — ED Provider Notes (Signed)
[facility]-EMERGENCY DEPT Provider Note   CSN: [id number] Arrival date & time: 08/16/18  0403     History   Chief Complaint Chief Complaint  Patient presents with  . Abdominal Pain    HPI Cassidy Bruce is a 22 y.o. female.   22 year old female presents to the emergency department for evaluation of suprapubic abdominal discomfort with onset at 2300 tonight.  Symptoms associated with urinary frequency as well as dysuria.  She has started to notice blood in her urine as well.  She denies taking any medications prior to arrival.  No nausea, vomiting, fevers, vaginal bleeding, vaginal discharge, back pain.  The history is provided by the patient. No language interpreter was used.  Abdominal Pain   Associated symptoms include dysuria and frequency.    Past Medical History:  Diagnosis Date  . ADHD   . Depression     Patient Active Problem List   Diagnosis Date Noted  . MDD (major depressive disorder), recurrent episode, severe (HCC) 07/25/2018  . Adjustment disorder with mixed anxiety and depressed mood     History reviewed. No pertinent surgical history.   OB History   None      Home Medications    Prior to Admission medications   Medication Sig Start Date End Date Taking? Authorizing Provider  hydrOXYzine (ATARAX/VISTARIL) 25 MG tablet Take 1 tablet (25 mg total) by mouth every 6 (six) hours as needed for anxiety. 07/26/18  Yes Oneta Rack, NP  traZODone (DESYREL) 50 MG tablet Take 1 tablet (50 mg total) by mouth at bedtime and may repeat dose one time if needed. 07/26/18  Yes Oneta Rack, NP  cephALEXin (KEFLEX) 500 MG capsule Take 1 capsule (500 mg total) by mouth 2 (two) times daily. 08/16/18   Antony Madura, PA-C  phenazopyridine (PYRIDIUM) 200 MG tablet Take 1 tablet (200 mg total) by mouth 3 (three) times daily. 08/16/18   Antony Madura, PA-C    Family History Family History  Problem Relation Age of Onset  . Cancer Other   .  Diabetes Other   . CAD Other     Social History Social History   Tobacco Use  . Smoking status: Never Smoker  . Smokeless tobacco: Never Used  Substance Use Topics  . Alcohol use: No  . Drug use: No     Allergies   Patient has no known allergies.   Review of Systems Review of Systems  Gastrointestinal: Positive for abdominal pain.  Genitourinary: Positive for dysuria and frequency.  Ten systems reviewed and are negative for acute change, except as noted in the HPI.    Physical Exam Updated Vital Signs BP (!) 150/129 (BP Location: Right Arm)   Pulse (!) 113   Temp 97.7 F (36.5 C) (Oral)   Resp 20   SpO2 99%   Physical Exam  Constitutional: She is oriented to person, place, and time. She appears well-developed and well-nourished. No distress.  Uncomfortable appearing; nontoxic.  HENT:  Head: Normocephalic and atraumatic.  Eyes: Conjunctivae and EOM are normal. No scleral icterus.  Neck: Normal range of motion.  Cardiovascular: Normal rate, regular rhythm and intact distal pulses.  Pulmonary/Chest: Effort normal. No stridor. No respiratory distress.  Abdominal: She exhibits no distension.  Musculoskeletal: Normal range of motion.  Neurological: She is alert and oriented to person, place, and time. She exhibits normal muscle tone. Coordination normal.  Ambulates with steady gait.  Skin: Skin is warm and dry. No rash noted. She is  not diaphoretic. No erythema. No pallor.  Psychiatric: She has a normal mood and affect. Her behavior is normal.  Nursing note and vitals reviewed.    ED Treatments / Results  Labs (all labs ordered are listed, but only abnormal results are displayed) Labs Reviewed  URINALYSIS, ROUTINE W REFLEX MICROSCOPIC - Abnormal; Notable for the following components:      Result Value   Color, Urine RED (*)    APPearance CLOUDY (*)    Hgb urine dipstick LARGE (*)    Protein, ur >=300 (*)    Leukocytes, UA LARGE (*)    RBC / HPF >50 (*)     WBC, UA >50 (*)    All other components within normal limits  URINE CULTURE  PREGNANCY, URINE    EKG None  Radiology No results found.  Procedures Procedures (including critical care time)  Medications Ordered in ED Medications  ibuprofen (ADVIL,MOTRIN) tablet 800 mg (has no administration in time range)  cephALEXin (KEFLEX) capsule 500 mg (has no administration in time range)  phenazopyridine (PYRIDIUM) tablet 200 mg (has no administration in time range)     Initial Impression / Assessment and Plan / ED Course  I have reviewed the triage vital signs and the nursing notes.  Pertinent labs & imaging results that were available during my care of the patient were reviewed by me and considered in my medical decision making (see chart for details).     Patient has been diagnosed with a UTI. Pt is afebrile, no CVA tenderness, normotensive, and denies N/V. Pt to be discharged home with antibiotics and instructions to follow up with PCP if symptoms persist. Return precautions discussed and provided. Patient discharged in stable condition with no unaddressed concerns.   Final Clinical Impressions(s) / ED Diagnoses   Final diagnoses:  Acute cystitis with hematuria    ED Discharge Orders         Ordered    phenazopyridine (PYRIDIUM) 200 MG tablet  3 times daily     08/16/18 0501    cephALEXin (KEFLEX) 500 MG capsule  2 times daily     08/16/18 0501           Antony Madura, PA-C 08/16/18 1610    Shaune Pollack, MD 08/16/18 [id number]

## 2018-08-16 NOTE — ED Triage Notes (Signed)
Pt complains of abdominal pain and urinary urgency Pt also states she has blood in her urine and it burns

## 2018-08-17 LAB — URINE CULTURE: Culture: NO GROWTH

## 2018-09-06 ENCOUNTER — Emergency Department (HOSPITAL_COMMUNITY)
Admission: EM | Admit: 2018-09-06 | Discharge: 2018-09-06 | Disposition: A | Discharge status: Home / Self Care | Payer: Self-pay | Attending: Emergency Medicine | Admitting: Emergency Medicine

## 2018-09-06 ENCOUNTER — Encounter (HOSPITAL_COMMUNITY): Payer: Self-pay | Admitting: Emergency Medicine

## 2018-09-06 DIAGNOSIS — B3731 Acute candidiasis of vulva and vagina: Secondary | ICD-10-CM

## 2018-09-06 DIAGNOSIS — B373 Candidiasis of vulva and vagina: Secondary | ICD-10-CM | POA: Insufficient documentation

## 2018-09-06 DIAGNOSIS — N946 Dysmenorrhea, unspecified: Secondary | ICD-10-CM | POA: Insufficient documentation

## 2018-09-06 LAB — I-STAT BETA HCG BLOOD, ED (MC, WL, AP ONLY): I-stat hCG, quantitative: <5 m[IU]/mL (ref <5)

## 2018-09-06 LAB — WET PREP, GENITAL
CLUE CELLS WET PREP: NONE SEEN
SPERM: NONE SEEN
Trich, Wet Prep: NONE SEEN

## 2018-09-06 LAB — URINALYSIS, ROUTINE W REFLEX MICROSCOPIC
Bilirubin Urine: NEGATIVE
GLUCOSE, UA: NEGATIVE mg/dL
Ketones, ur: 20 mg/dL — AB
LEUKOCYTES UA: NEGATIVE
NITRITE: NEGATIVE
PH: 7 (ref 5.0–8.0)
Protein, ur: NEGATIVE mg/dL
SPECIFIC GRAVITY, URINE: 1.011 (ref 1.005–1.030)

## 2018-09-06 LAB — BASIC METABOLIC PANEL
ANION GAP: 7 (ref 5–15)
BUN: 6 mg/dL (ref 6–20)
CALCIUM: 8.7 mg/dL — AB (ref 8.9–10.3)
CO2: 24 mmol/L (ref 22–32)
CREATININE: 0.61 mg/dL (ref 0.44–1.00)
Chloride: 109 mmol/L (ref 98–111)
GFR calc non Af Amer: >60 mL/min (ref >60)
Glucose, Bld: 94 mg/dL (ref 70–99)
Potassium: 3.8 mmol/L (ref 3.5–5.1)
SODIUM: 140 mmol/L (ref 135–145)

## 2018-09-06 LAB — CBC WITH DIFFERENTIAL/PLATELET
Abs Immature Granulocytes: 0.02 10*3/uL (ref 0.00–0.07)
BASOS ABS: 0 10*3/uL (ref 0.0–0.1)
Basophils Relative: 0 %
EOS ABS: 0 10*3/uL (ref 0.0–0.5)
Eosinophils Relative: 0 %
HCT: 36.6 % (ref 36.0–46.0)
HEMOGLOBIN: 11.5 g/dL — AB (ref 12.0–15.0)
IMMATURE GRANULOCYTES: 0 %
LYMPHS ABS: 0.8 10*3/uL (ref 0.7–4.0)
LYMPHS PCT: 8 %
MCH: 22.9 pg — ABNORMAL LOW (ref 26.0–34.0)
MCHC: 31.4 g/dL (ref 30.0–36.0)
MCV: 72.8 fL — ABNORMAL LOW (ref 80.0–100.0)
Monocytes Absolute: 0.4 10*3/uL (ref 0.1–1.0)
Monocytes Relative: 4 %
NEUTROS PCT: 88 %
NRBC: 0 % (ref 0.0–0.2)
Neutro Abs: 8.7 10*3/uL — ABNORMAL HIGH (ref 1.7–7.7)
Platelets: 175 10*3/uL (ref 150–400)
RBC: 5.03 MIL/uL (ref 3.87–5.11)
RDW: 16 % — AB (ref 11.5–15.5)
WBC: 10 10*3/uL (ref 4.0–10.5)

## 2018-09-06 MED ORDER — FLUCONAZOLE 150 MG PO TABS
150.0000 mg | ORAL_TABLET | Freq: Once | ORAL | Status: AC
  Administered 2018-09-06: 150 mg via ORAL
  Filled 2018-09-06: qty 1

## 2018-09-06 MED ORDER — IBUPROFEN 800 MG PO TABS
800.0000 mg | ORAL_TABLET | Freq: Once | ORAL | Status: AC
  Administered 2018-09-06: 800 mg via ORAL
  Filled 2018-09-06: qty 1

## 2018-09-06 MED ORDER — IBUPROFEN 200 MG PO TABS: 600.0000 mg | ORAL_TABLET | Freq: Once | ORAL | Status: DC

## 2018-09-06 NOTE — ED Triage Notes (Signed)
[facility] [place] with pelvic pain. Started menstrual cycle this morning and flow is heavier than normal.

## 2018-09-06 NOTE — ED Provider Notes (Signed)
[name]-EMERGENCY DEPT Provider Note   CSN: [id number] Arrival date & time: 09/06/18  1106   History   Chief Complaint Chief Complaint  Patient presents with  . Dysmenorrhea  . Back Pain    HPI Cassidy Bruce is a 22 y.o. female with past medical history significant for depression who presents for evaluation of lower abdominal cramping.  Patient states this started this morning.  States her pain radiates to her lower back.  Describes her pain as cramping and nagging.  Rates her pain a 6/10.  States that she did start her menstrual cycle this morning and her flow is "more than my normal."  States she has had to change her tampon every 3 hours.  Patient states she normally only has to change her tampon every 5 hours with her regular cycle.  Patient states she is sexually active and does not use of contraception.  Admits to yellow vaginal discharge.  Patient states this has been worse over the past 3 days.  States she would like to be tested for STDs during her visit today.  Denies fever, chills, nausea, vomiting, lightheadedness, dizziness, diarrhea, constipation, dysuria, midline back pain.  History obtained from patient.  No interpreter was used.  HPI  Past Medical History:  Diagnosis Date  . ADHD   . Depression     Patient Active Problem List   Diagnosis Date Noted  . MDD (major depressive disorder), recurrent episode, severe (HCC) 07/25/2018  . Adjustment disorder with mixed anxiety and depressed mood     History reviewed. No pertinent surgical history.   OB History   None      Home Medications    Prior to Admission medications   Medication Sig Start Date End Date Taking? Authorizing Provider  cephALEXin (KEFLEX) 500 MG capsule Take 1 capsule (500 mg total) by mouth 2 (two) times daily. 08/16/18  Yes Antony Madura, PA-C  hydrOXYzine (ATARAX/VISTARIL) 25 MG tablet Take 1 tablet (25 mg total) by mouth every 6 (six) hours as needed for  anxiety. Patient not taking: Reported on 09/06/2018 07/26/18   Oneta Rack, NP  phenazopyridine (PYRIDIUM) 200 MG tablet Take 1 tablet (200 mg total) by mouth 3 (three) times daily. Patient not taking: Reported on 09/06/2018 08/16/18   Antony Madura, PA-C  traZODone (DESYREL) 50 MG tablet Take 1 tablet (50 mg total) by mouth at bedtime and may repeat dose one time if needed. Patient not taking: Reported on 09/06/2018 07/26/18   Oneta Rack, NP    Family History Family History  Problem Relation Age of Onset  . Cancer Other   . Diabetes Other   . CAD Other     Social History Social History   Tobacco Use  . Smoking status: Never Smoker  . Smokeless tobacco: Never Used  Substance Use Topics  . Alcohol use: No  . Drug use: No     Allergies   Patient has no known allergies.   Review of Systems Review of Systems  Constitutional: Negative.   Respiratory: Negative.   Cardiovascular: Negative.   Gastrointestinal: Positive for abdominal pain. Negative for abdominal distention, anal bleeding, blood in stool, constipation, diarrhea, nausea, rectal pain and vomiting.  Genitourinary: Negative.   Musculoskeletal: Positive for back pain.  Skin: Negative.   Neurological: Negative for dizziness and light-headedness.  All other systems reviewed and are negative.    Physical Exam Updated Vital Signs BP 124/67 (BP Location: Right Arm)   Pulse 76   Temp  97.7 F (36.5 C) (Oral)   Resp 18   Ht 5\' 4"  (1.626 m)   Wt 49.9 kg   LMP 09/06/2018   SpO2 100%   BMI 18.88 kg/m   Physical Exam  Constitutional: She appears well-developed and well-nourished. No distress.  HENT:  Head: Atraumatic.  Mouth/Throat: Oropharynx is clear and moist.  Eyes: Pupils are equal, round, and reactive to light.  Neck: Normal range of motion.  Cardiovascular: Normal rate, regular rhythm, normal heart sounds and intact distal pulses. Exam reveals no gallop and no friction rub.  No murmur  heard. Pulmonary/Chest: Effort normal and breath sounds normal. No stridor. No respiratory distress. She has no wheezes. She has no rales. She exhibits no tenderness.  Abdominal: Soft. Normal appearance and bowel sounds are normal. She exhibits no shifting dullness, no distension, no pulsatile liver, no fluid wave, no abdominal bruit, no ascites, no pulsatile midline mass and no mass. There is no hepatosplenomegaly. There is tenderness in the right lower quadrant and left lower quadrant. There is no rigidity, no rebound, no guarding, no CVA tenderness, no tenderness at McBurney's point and negative Murphy's sign. No hernia.  Genitourinary:  Genitourinary Comments: Normal appearing external female genitalia without rashes or lesions, normal vaginal epithelium Normal appearing cervix without discharge or petechiae.There is  bleeding noted at the os.No Odor. Bimanual: No CMT, nontender.  No palpable adnexal masses or tenderness. Uterus midline and not fixed. Rectovaginal exam was deferred.  No cystocele or rectocele noted. No pelvic lymphadenopathy noted. Wet prep was obtained.  Cultures for gonorrhea and chlamydia collected. Exam performed with chaperone in room.  Musculoskeletal: Normal range of motion.  Neurological: She is alert.  Skin: Skin is warm and dry. She is not diaphoretic.  Psychiatric: She has a normal mood and affect.  Nursing note and vitals reviewed.    ED Treatments / Results  Labs (all labs ordered are listed, but only abnormal results are displayed) Labs Reviewed  WET PREP, GENITAL - Abnormal; Notable for the following components:      Result Value   Yeast Wet Prep HPF POC PRESENT (*)    WBC, Wet Prep HPF POC FEW (*)    All other components within normal limits  CBC WITH DIFFERENTIAL/PLATELET - Abnormal; Notable for the following components:   Hemoglobin 11.5 (*)    MCV 72.8 (*)    MCH 22.9 (*)    RDW 16.0 (*)    Neutro Abs 8.7 (*)    All other components within normal  limits  BASIC METABOLIC PANEL - Abnormal; Notable for the following components:   Calcium 8.7 (*)    All other components within normal limits  URINALYSIS, ROUTINE W REFLEX MICROSCOPIC - Abnormal; Notable for the following components:   Color, Urine STRAW (*)    Hgb urine dipstick MODERATE (*)    Ketones, ur 20 (*)    Bacteria, UA RARE (*)    All other components within normal limits  HIV ANTIBODY (ROUTINE TESTING W REFLEX)  RPR  I-STAT BETA HCG BLOOD, ED ([facility], [facility], [facility] ONLY)  GC/CHLAMYDIA PROBE AMP ([place]) NOT AT [facility]    EKG None  Radiology No results found.  Procedures Procedures (including critical care time)  Medications Ordered in ED Medications  ibuprofen (ADVIL,MOTRIN) tablet 800 mg (800 mg Oral Given 09/06/18 1225)  fluconazole (DIFLUCAN) tablet 150 mg (150 mg Oral Given 09/06/18 1442)     Initial Impression / Assessment and Plan / ED Course  I have reviewed  the triage vital signs and the nursing notes.  Pertinent labs & imaging results that were available during my care of the patient were reviewed by me and considered in my medical decision making (see chart for details).  22 year old who presents for evaluation of lower abdominal cramping which began this morning.  Afebrile, nonseptic, non-ill-appearing. Patient's pain and other symptoms adequately managed in emergency department. Labs, imaging and vitals reviewed.  Labs without leukocytosis, metabolic panel without any electrolyte, renal or liver abnormalities.  Urinalysis without any evidence of infection.  Wet prep with yeast.  GC/chlamydia, HIV, RPR pending.  hCG negative.  Reevaluation pain is resolved with ibuprofen. Stating "I have to be at work soon." She is ambulatory in room without complaints. Patient does not meet the SIRS or Sepsis criteria.  On repeat exam patient does not have a surgical abdomin and there are no peritoneal signs.  No indication of appendicitis, bowel obstruction, bowel perforation,  cholecystitis, diverticulitis, PID or ectopic pregnancy.  GU exam without cervical motion tenderness.  Patient requesting DC home at this time.    Patient discharged home with symptomatic treatment and given strict instructions for follow-up with their primary care physician.  I have also discussed reasons to return immediately to the ER. Patient expresses understanding and agrees with plan.   Final Clinical Impressions(s) / ED Diagnoses   Final diagnoses:  Dysmenorrhea  Vaginal yeast infection    ED Discharge Orders    None       Teona Vargus A, PA-C 09/06/18 1444    Lorre Nick, MD 09/07/18 [phone]

## 2018-09-06 NOTE — Discharge Instructions (Addendum)
You were evaluated today for abdominal cramping. This is most likely from your menstrual cycle. You may take Tylenol or Ibuprofen for you pain. You may also use a heating pad.  Your wet prep did show evidence of a yeast infection.  I have prescribed you medicine you will take 1 time in your department for this.  Your GC/chlamydia testing is pending at DC.  These results will take 24 to 48 hours to return.  You will be called if these results are positive as well as your HIV and syphilis testing.  If these results are positive you will need to be seen to be treated.  At that time you also need to let your sexual partners know that you have been treated to they may be treated as well.  Please wait 1 week before resuming sexual intercourse after being treated.  Return to the ED for any new or worsening symptoms.

## 2018-09-07 LAB — GC/CHLAMYDIA PROBE AMP (~~LOC~~) NOT AT ARMC
CHLAMYDIA, DNA PROBE: NEGATIVE
NEISSERIA GONORRHEA: NEGATIVE

## 2018-09-07 LAB — RPR: RPR Ser Ql: NONREACTIVE

## 2018-09-07 LAB — HIV ANTIBODY (ROUTINE TESTING W REFLEX): HIV Screen 4th Generation wRfx: NONREACTIVE

## 2018-09-24 ENCOUNTER — Emergency Department (HOSPITAL_COMMUNITY)
Admission: EM | Admit: 2018-09-24 | Discharge: 2018-09-24 | Disposition: A | Discharge status: Home / Self Care | Payer: Self-pay | Attending: Emergency Medicine | Admitting: Emergency Medicine

## 2018-09-24 ENCOUNTER — Encounter (HOSPITAL_COMMUNITY): Payer: Self-pay | Admitting: Emergency Medicine

## 2018-09-24 DIAGNOSIS — N898 Other specified noninflammatory disorders of vagina: Secondary | ICD-10-CM

## 2018-09-24 DIAGNOSIS — B9689 Other specified bacterial agents as the cause of diseases classified elsewhere: Secondary | ICD-10-CM | POA: Insufficient documentation

## 2018-09-24 DIAGNOSIS — F909 Attention-deficit hyperactivity disorder, unspecified type: Secondary | ICD-10-CM | POA: Insufficient documentation

## 2018-09-24 DIAGNOSIS — N760 Acute vaginitis: Secondary | ICD-10-CM | POA: Insufficient documentation

## 2018-09-24 LAB — URINALYSIS, ROUTINE W REFLEX MICROSCOPIC
Bilirubin Urine: NEGATIVE
GLUCOSE, UA: NEGATIVE mg/dL
Hgb urine dipstick: NEGATIVE
KETONES UR: NEGATIVE mg/dL
Nitrite: NEGATIVE
PROTEIN: NEGATIVE mg/dL
Specific Gravity, Urine: 1.015 (ref 1.005–1.030)
pH: 7 (ref 5.0–8.0)

## 2018-09-24 LAB — WET PREP, GENITAL
Sperm: NONE SEEN
TRICH WET PREP: NONE SEEN
YEAST WET PREP: NONE SEEN

## 2018-09-24 LAB — I-STAT BETA HCG BLOOD, ED (MC, WL, AP ONLY): I-stat hCG, quantitative: <5 m[IU]/mL (ref <5)

## 2018-09-24 MED ORDER — LIDOCAINE HCL 1 % IJ SOLN
INTRAMUSCULAR | Status: AC
  Filled 2018-09-24: qty 20

## 2018-09-24 MED ORDER — METRONIDAZOLE 500 MG PO TABS: 500.0000 mg | ORAL_TABLET | Freq: Two times a day (BID) | ORAL | 0 refills | Status: DC

## 2018-09-24 MED ORDER — CEFTRIAXONE SODIUM 250 MG IJ SOLR
250.0000 mg | Freq: Once | INTRAMUSCULAR | Status: AC
  Administered 2018-09-24: 250 mg via INTRAMUSCULAR
  Filled 2018-09-24: qty 250

## 2018-09-24 MED ORDER — AZITHROMYCIN 250 MG PO TABS
1000.0000 mg | ORAL_TABLET | Freq: Once | ORAL | Status: AC
  Administered 2018-09-24: 1000 mg via ORAL
  Filled 2018-09-24: qty 4

## 2018-09-24 NOTE — Discharge Instructions (Signed)
You were treated prophylactically for gonorrhea and chlamydia today, please take Flagyl twice daily for the next week as directed, do not drink alcohol while on this medication as it will cause severe nausea and vomiting.  You have STD testing pending and will be contacted in 2 to 3 days with any positive results, if anything is positive you will need to tell partners so they can be tested and treated as well, you may go to the health department for any future STD testing needs or follow-up with a gynecologist. You should avoid sexual activity for the next week to allow antibiotics to work. If your discharge persists is important that you follow-up with a gynecologist for further evaluation.  Return to the emergency department if you develop abdominal pain, fevers, vomiting or any other new or concerning symptoms.

## 2018-09-24 NOTE — ED Triage Notes (Signed)
Pt c/o vaginal discharge that is white and foul odor for about week. Reports that was here about 2 weeks ago and was diagnosed with BV and took antibiotics.

## 2018-09-24 NOTE — ED Provider Notes (Signed)
[facility]-EMERGENCY DEPT Provider Note   CSN: [id number] Arrival date & time: 09/24/18  4098     History   Chief Complaint Chief Complaint  Patient presents with  . Vaginal Discharge    HPI Cassidy Bruce is a 22 y.o. female.  Cassidy Bruce is a 22 y.o. Female with a history of depression and ADHD, who presents to the emergency department for evaluation of 1 week of vaginal discharge.  She describes this as white with a foul odor.  She reports she was seen here about 2 weeks ago and diagnosed with BV, completed course of antibiotics and saw some improvement but she feels like her discharge came right back.  She reports she is sexually active but has not had any new sexual partners.  She denies dysuria or urinary frequency, no abdominal or flank pain.  No fevers or chills.  No diarrhea, constipation, melena or hematochezia, no nausea or vomiting.  She has not taken anything to treat her symptoms prior to arrival.  No other aggravating or alleviating factors.  She does not have an OB/GYN that she follows with.     Past Medical History:  Diagnosis Date  . ADHD   . Depression     Patient Active Problem List   Diagnosis Date Noted  . MDD (major depressive disorder), recurrent episode, severe (HCC) 07/25/2018  . Adjustment disorder with mixed anxiety and depressed mood     History reviewed. No pertinent surgical history.   OB History   None      Home Medications    Prior to Admission medications   Medication Sig Start Date End Date Taking? Authorizing Provider  cephALEXin (KEFLEX) 500 MG capsule Take 1 capsule (500 mg total) by mouth 2 (two) times daily. Patient not taking: Reported on 09/24/2018 08/16/18   Antony Madura, PA-C  hydrOXYzine (ATARAX/VISTARIL) 25 MG tablet Take 1 tablet (25 mg total) by mouth every 6 (six) hours as needed for anxiety. Patient not taking: Reported on 09/06/2018 07/26/18   Oneta Rack, NP  metroNIDAZOLE (FLAGYL) 500  MG tablet Take 1 tablet (500 mg total) by mouth 2 (two) times daily. One po bid x 7 days 09/24/18   Dartha Lodge, PA-C  phenazopyridine (PYRIDIUM) 200 MG tablet Take 1 tablet (200 mg total) by mouth 3 (three) times daily. Patient not taking: Reported on 09/06/2018 08/16/18   Antony Madura, PA-C  traZODone (DESYREL) 50 MG tablet Take 1 tablet (50 mg total) by mouth at bedtime and may repeat dose one time if needed. Patient not taking: Reported on 09/06/2018 07/26/18   Oneta Rack, NP    Family History Family History  Problem Relation Age of Onset  . Cancer Other   . Diabetes Other   . CAD Other     Social History Social History   Tobacco Use  . Smoking status: Never Smoker  . Smokeless tobacco: Never Used  Substance Use Topics  . Alcohol use: No  . Drug use: No     Allergies   Patient has no known allergies.   Review of Systems Review of Systems  Constitutional: Negative for chills and fever.  HENT: Negative.   Eyes: Negative for visual disturbance.  Respiratory: Negative for cough and shortness of breath.   Cardiovascular: Negative for chest pain.  Gastrointestinal: Negative for abdominal pain, constipation, diarrhea, nausea and vomiting.  Genitourinary: Positive for vaginal discharge and vaginal pain. Negative for dysuria, flank pain, frequency, genital sores, pelvic pain and vaginal  bleeding.  Musculoskeletal: Negative for arthralgias, back pain and myalgias.  Skin: Negative for color change and rash.  Neurological: Negative for dizziness, syncope and light-headedness.     Physical Exam Updated Vital Signs BP 121/67 (BP Location: Right Arm)   Pulse 65   Temp (!) 97.5 F (36.4 C) (Oral)   Resp 16   LMP 09/06/2018   SpO2 100%   Physical Exam  Constitutional: She is oriented to person, place, and time. She appears well-developed and well-nourished. No distress.  HENT:  Head: Normocephalic and atraumatic.  Eyes: Right eye exhibits no discharge. Left eye  exhibits no discharge.  Neck: Neck supple.  Cardiovascular: Normal rate, regular rhythm, normal heart sounds and intact distal pulses.  Pulmonary/Chest: Effort normal. No respiratory distress.  Respirations equal and unlabored, patient able to speak in full sentences, lungs clear to auscultation bilaterally  Abdominal: Soft. Bowel sounds are normal. She exhibits no distension and no mass. There is no tenderness. There is no guarding.  Abdomen soft, nondistended, nontender to palpation in all quadrants without guarding or peritoneal signs  Genitourinary:  Genitourinary Comments: Chaperone present during pelvic exam.  No external genital lesions noted.  Copious amounts of white discharge present on pelvic exam with slight odor, bimanual exam reveals no cervical motion tenderness there is no focal adnexal tenderness or palpable masses  Musculoskeletal: She exhibits no deformity.  Neurological: She is alert and oriented to person, place, and time. Coordination normal.  Skin: Skin is warm and dry. Capillary refill takes less than 2 seconds. She is not diaphoretic.  Psychiatric: She has a normal mood and affect. Her behavior is normal.  Nursing note and vitals reviewed.    ED Treatments / Results  Labs (all labs ordered are listed, but only abnormal results are displayed) Labs Reviewed  WET PREP, GENITAL - Abnormal; Notable for the following components:      Result Value   Clue Cells Wet Prep HPF POC PRESENT (*)    WBC, Wet Prep HPF POC MODERATE (*)    All other components within normal limits  URINALYSIS, ROUTINE W REFLEX MICROSCOPIC - Abnormal; Notable for the following components:   APPearance CLOUDY (*)    Leukocytes, UA MODERATE (*)    Bacteria, UA RARE (*)    All other components within normal limits  RPR  HIV ANTIBODY (ROUTINE TESTING W REFLEX)  I-STAT BETA HCG BLOOD, ED ([facility], [facility], [facility] ONLY)  GC/CHLAMYDIA PROBE AMP ([place]) NOT AT [facility]    EKG None  Radiology No results  found.  Procedures Procedures (including critical care time)  Medications Ordered in ED Medications  cefTRIAXone (ROCEPHIN) injection 250 mg (has no administration in time range)  azithromycin (ZITHROMAX) tablet 1,000 mg (has no administration in time range)     Initial Impression / Assessment and Plan / ED Course  I have reviewed the triage vital signs and the nursing notes.  Pertinent labs & imaging results that were available during my care of the patient were reviewed by me and considered in my medical decision making (see chart for details).  Pt presents with concerns for possible STD.  Pt understands that they have GC/Chlamydia cultures pending and that they will need to inform all sexual partners if results return positive. Pt has been treated prophylactically with azithromycin and Rocephin due to pts history, pelvic exam, and wet prep with increased WBCs. Pt not concerning for PID because hemodynamically stable and no cervical motion tenderness on pelvic exam. Pt has also been  treated with Flagyl for Bacterial Vaginosis. Pt has been advised to not drink alcohol while on this medication.  Patient to be discharged with instructions to follow up with OBGYN/PCP. Discussed importance of using protection when sexually active.    Final Clinical Impressions(s) / ED Diagnoses   Final diagnoses:  Vaginal discharge  BV (bacterial vaginosis)    ED Discharge Orders         Ordered    metroNIDAZOLE (FLAGYL) 500 MG tablet  2 times daily     09/24/18 1145           Dartha LodgeFord, Kelsey N, [place] JerseyPA-C 09/24/18 1147    Raeford RazorKohut, Stephen, MD 09/25/18 [phone]

## 2018-09-25 LAB — HIV ANTIBODY (ROUTINE TESTING W REFLEX): HIV Screen 4th Generation wRfx: NONREACTIVE

## 2018-09-25 LAB — RPR: RPR: NONREACTIVE

## 2018-09-27 LAB — CERVICOVAGINAL ANCILLARY ONLY
Chlamydia: NEGATIVE
Neisseria Gonorrhea: NEGATIVE

## 2018-11-22 ENCOUNTER — Ambulatory Visit: Payer: Self-pay | Admitting: Women's Health
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
## 2019-03-17 ENCOUNTER — Emergency Department (HOSPITAL_COMMUNITY)
Admission: EM | Admit: 2019-03-17 | Discharge: 2019-03-17 | Disposition: A | Discharge status: Home / Self Care | Payer: Self-pay | Attending: Emergency Medicine | Admitting: Emergency Medicine

## 2019-03-17 ENCOUNTER — Other Ambulatory Visit: Payer: Self-pay

## 2019-03-17 ENCOUNTER — Encounter (HOSPITAL_COMMUNITY): Payer: Self-pay

## 2019-03-17 DIAGNOSIS — R300 Dysuria: Secondary | ICD-10-CM | POA: Insufficient documentation

## 2019-03-17 LAB — URINALYSIS, ROUTINE W REFLEX MICROSCOPIC
RBC / HPF: >50 RBC/hpf — ABNORMAL HIGH (ref 0–5)
WBC, UA: >50 WBC/hpf — ABNORMAL HIGH (ref 0–5)

## 2019-03-17 LAB — I-STAT BETA HCG BLOOD, ED (MC, WL, AP ONLY): I-stat hCG, quantitative: <5 m[IU]/mL (ref <5)

## 2019-03-17 MED ORDER — CEPHALEXIN 500 MG PO CAPS: 500.0000 mg | ORAL_CAPSULE | Freq: Two times a day (BID) | ORAL | 0 refills | Status: AC

## 2019-03-17 MED ORDER — PHENAZOPYRIDINE HCL 200 MG PO TABS: 200.0000 mg | ORAL_TABLET | Freq: Three times a day (TID) | ORAL | 0 refills | Status: DC

## 2019-03-17 NOTE — ED Notes (Signed)
Pt informs this nurse she does not want a pelvic exam at this time. This nurse notified ED provider.

## 2019-03-17 NOTE — ED Provider Notes (Signed)
[facility]-EMERGENCY DEPT Provider Note   CSN: [id number] Arrival date & time: 03/17/19  1435    History   Chief Complaint Chief Complaint  Patient presents with  . Dysuria    HPI Cassidy Bruce is a 23 y.o. female.     HPI   Cassidy Bruce is a 23 y.o. female, with a history of depression, presenting to the ED with dysuria beginning this morning.  States this is consistent with previous UTIs. Accompanied by hematuria.  Also notes abnormal vaginal discharge beginning 3 days ago.  Occasional loose stool.  Patient is in a sexual relationship with a female partner whom she knows is also sexually active with another female.  No parties use any type of protection. LMP beginning of May. Denies fever/chills, abdominal pain, flank/back pain, vaginal bleeding, dyspareunia, hematochezia/melena, or any other complaints.   Past Medical History:  Diagnosis Date  . ADHD   . Depression     Patient Active Problem List   Diagnosis Date Noted  . MDD (major depressive disorder), recurrent episode, severe (HCC) 07/25/2018  . Adjustment disorder with mixed anxiety and depressed mood     History reviewed. No pertinent surgical history.   OB History   No obstetric history on file.      Home Medications    Prior to Admission medications   Medication Sig Start Date End Date Taking? Authorizing Provider  cephALEXin (KEFLEX) 500 MG capsule Take 1 capsule (500 mg total) by mouth 2 (two) times daily for 5 days. 03/17/19 03/22/19  Lyrick Lagrand C, PA-C  hydrOXYzine (ATARAX/VISTARIL) 25 MG tablet Take 1 tablet (25 mg total) by mouth every 6 (six) hours as needed for anxiety. Patient not taking: Reported on 09/06/2018 07/26/18   Oneta RackLewis, Tanika N, NP  metroNIDAZOLE (FLAGYL) 500 MG tablet Take 1 tablet (500 mg total) by mouth 2 (two) times daily. One po bid x 7 days 09/24/18   Dartha LodgeFord, Kelsey N, PA-C  phenazopyridine (PYRIDIUM) 200 MG tablet Take 1 tablet (200 mg total) by mouth 3  (three) times daily. 03/17/19   Paloma Grange C, PA-C  traZODone (DESYREL) 50 MG tablet Take 1 tablet (50 mg total) by mouth at bedtime and may repeat dose one time if needed. Patient not taking: Reported on 09/06/2018 07/26/18   Oneta RackLewis, Tanika N, NP    Family History Family History  Problem Relation Age of Onset  . Cancer Other   . Diabetes Other   . CAD Other     Social History Social History   Tobacco Use  . Smoking status: Never Smoker  . Smokeless tobacco: Never Used  Substance Use Topics  . Alcohol use: No  . Drug use: No     Allergies   Patient has no known allergies.   Review of Systems Review of Systems  Constitutional: Negative for chills, diaphoresis and fever.  Respiratory: Negative for shortness of breath.   Cardiovascular: Negative for chest pain.  Gastrointestinal: Positive for diarrhea. Negative for abdominal pain, blood in stool, nausea and vomiting.  Genitourinary: Positive for dysuria, hematuria and vaginal discharge. Negative for dyspareunia, flank pain, genital sores, pelvic pain, vaginal bleeding and vaginal pain.  Musculoskeletal: Negative for back pain.  Neurological: Negative for weakness.  All other systems reviewed and are negative.    Physical Exam Updated Vital Signs BP 126/88 (BP Location: Right Arm)   Pulse 90   Temp 97.7 F (36.5 C) (Oral)   Resp 16   Ht 5\' 5"  (1.651 m)  Wt 52.2 kg   LMP 03/04/2019 (Approximate)   SpO2 100%   BMI 19.14 kg/m   Physical Exam Vitals signs and nursing note reviewed.  Constitutional:      General: She is not in acute distress.    Appearance: She is well-developed. She is not diaphoretic.  HENT:     Head: Normocephalic and atraumatic.     Mouth/Throat:     Mouth: Mucous membranes are moist.     Pharynx: Oropharynx is clear.  Eyes:     Conjunctiva/sclera: Conjunctivae normal.  Neck:     Musculoskeletal: Neck supple.  Cardiovascular:     Rate and Rhythm: Normal rate and regular rhythm.      Pulses: Normal pulses.  Pulmonary:     Effort: Pulmonary effort is normal. No respiratory distress.  Abdominal:     Palpations: Abdomen is soft.     Tenderness: There is no abdominal tenderness. There is no guarding.  Musculoskeletal:     Right lower leg: No edema.     Left lower leg: No edema.  Lymphadenopathy:     Cervical: No cervical adenopathy.  Skin:    General: Skin is warm and dry.  Neurological:     Mental Status: She is alert.  Psychiatric:        Mood and Affect: Mood and affect normal.        Speech: Speech normal.        Behavior: Behavior normal.      ED Treatments / Results  Labs (all labs ordered are listed, but only abnormal results are displayed) Labs Reviewed  URINALYSIS, ROUTINE W REFLEX MICROSCOPIC - Abnormal; Notable for the following components:      Result Value   Color, Urine RED (*)    APPearance CLOUDY (*)    Glucose, UA   (*)    Value: TEST NOT REPORTED DUE TO COLOR INTERFERENCE OF URINE PIGMENT   Hgb urine dipstick   (*)    Value: TEST NOT REPORTED DUE TO COLOR INTERFERENCE OF URINE PIGMENT   Bilirubin Urine   (*)    Value: TEST NOT REPORTED DUE TO COLOR INTERFERENCE OF URINE PIGMENT   Ketones, ur   (*)    Value: TEST NOT REPORTED DUE TO COLOR INTERFERENCE OF URINE PIGMENT   Protein, ur   (*)    Value: TEST NOT REPORTED DUE TO COLOR INTERFERENCE OF URINE PIGMENT   Nitrite   (*)    Value: TEST NOT REPORTED DUE TO COLOR INTERFERENCE OF URINE PIGMENT   Leukocytes,Ua   (*)    Value: TEST NOT REPORTED DUE TO COLOR INTERFERENCE OF URINE PIGMENT   RBC / HPF >50 (*)    WBC, UA >50 (*)    Bacteria, UA FEW (*)    All other components within normal limits  URINE CULTURE  RPR  HIV ANTIBODY (ROUTINE TESTING W REFLEX)  I-STAT BETA HCG BLOOD, ED ([facility], [facility], [facility] ONLY)  GC/CHLAMYDIA PROBE AMP ([facility]) NOT AT [facility]    EKG None  Radiology No results found.  Procedures Procedures (including critical care time)  Medications Ordered in ED  Medications - No data to display   Initial Impression / Assessment and Plan / ED Course  I have reviewed the triage vital signs and the nursing notes.  Pertinent labs & imaging results that were available during my care of the patient were reviewed by me and considered in my medical decision making (see chart for details).  Patient presents with dysuria and hematuria. Also notes vaginal discharge without dyspareunia or abdominal pain. Patient is nontoxic appearing, afebrile, not tachycardic, not tachypneic, not hypotensive, and is in no apparent distress.    I recommended pelvic exam and STD testing.  Patient initially agreed, but then changed her mind.  She has been advised of the importance of obtaining pelvic exam given her risk factors for STDs.  Resources given. Patient with gross hematuria and symptoms she states are consistent with previous UTIs.  We will treat for UTI, however, patient has strict instructions to follow-up with a PCP versus OB/GYN should symptoms fail to resolve. The patient was given instructions for home care as well as return precautions. Patient voices understanding of these instructions, accepts the plan, and is comfortable with discharge.   Cassidy Bruce was evaluated in Emergency Department on 03/17/2019 for the symptoms described in the history of present illness. She was evaluated in the context of the global COVID-19 pandemic, which necessitated consideration that the patient might be at risk for infection with the SARS-CoV-2 virus that causes COVID-19. Institutional protocols and algorithms that pertain to the evaluation of patients at risk for COVID-19 are in a state of rapid change based on information released by regulatory bodies including the CDC and federal and state organizations. These policies and algorithms were followed during the patient's care in the ED.  Final Clinical Impressions(s) / ED Diagnoses   Final diagnoses:  Dysuria    ED  Discharge Orders         Ordered    cephALEXin (KEFLEX) 500 MG capsule  2 times daily     03/17/19 1614    phenazopyridine (PYRIDIUM) 200 MG tablet  3 times daily     03/17/19 1614           Anselm Pancoast, PA-C 03/17/19 1712    Mancel Bale, MD 03/18/19 [phone]

## 2019-03-17 NOTE — ED Triage Notes (Addendum)
Pt to ED reports pain with urination and urinary frequency that started 4 hours ago, reports seeing blood in urine

## 2019-03-17 NOTE — Discharge Instructions (Addendum)
It is possible that you have a urinary tract infection. Please take all of your antibiotics until finished!   You may develop abdominal discomfort or diarrhea from the antibiotic.  You may help offset this with probiotics which you can buy or get in yogurt. Do not eat or take the probiotics until 2 hours after your antibiotic.   You have been tested for STDs. Some of these results are still pending. Any abnormalities will be called to you.  Be sure to follow safe sex practices, including monogamy and/or condom use.  Should symptoms fail to improve within the next few days, follow up with a primary care provider, OBGYN, or go to the [facility].

## 2019-03-17 NOTE — ED Notes (Signed)
Spoke with Lab, able to add on GC/Chlamydia to previously collected urine specimen.

## 2019-03-17 NOTE — ED Notes (Signed)
Bed: WTR7 Expected date:  Expected time:  Means of arrival:  Comments: 

## 2019-03-18 LAB — GC/CHLAMYDIA PROBE AMP (~~LOC~~) NOT AT ARMC
Chlamydia: NEGATIVE
Neisseria Gonorrhea: NEGATIVE

## 2019-03-18 LAB — RPR: RPR Ser Ql: NONREACTIVE

## 2019-03-18 LAB — HIV ANTIBODY (ROUTINE TESTING W REFLEX): HIV Screen 4th Generation wRfx: NONREACTIVE

## 2019-03-19 LAB — URINE CULTURE: Culture: >=100000 — AB

## 2019-03-20 ENCOUNTER — Telehealth: Payer: Self-pay | Admitting: Emergency Medicine

## 2019-03-20 NOTE — Telephone Encounter (Signed)
Post ED Visit - Positive Culture Follow-up  Culture report reviewed by antimicrobial stewardship pharmacist: [facility] []  Enzo Bi, Pharm.D. []  Celedonio Miyamoto, Pharm.D., BCPS AQ-ID []  Garvin Fila, Pharm.D., BCPS []  Georgina Pillion, [address].D., BCPS []  [place], [address].D., BCPS, AAHIVP []  Estella Husk, Pharm.D., BCPS, AAHIVP []  Lysle Pearl, PharmD, BCPS []  Phillips Climes, PharmD, BCPS []  Agapito Games, PharmD, BCPS []  Verlan Friends, PharmD []  Mervyn Gay, PharmD, BCPS []  Vinnie Level, PharmD  [facility] []  Len Childs, PharmD []  Greer Pickerel, PharmD []  Adalberto Cole, PharmD []  Perlie Gold, Rph []  Lonell Face) Jean Rosenthal, PharmD []  Earl Many, PharmD [x]  Junita Push, PharmD []  Dorna Leitz, PharmD []  Terrilee Files, PharmD []  Lynann Beaver, PharmD []  Keturah Barre, PharmD []  Loralee Pacas, PharmD []  Bernadene Person, PharmD   Positive urine culture Treated with cephalexin, organism sensitive to the same and no further patient follow-up is required at this time.  Carollee Herter Marcello Tuzzolino 03/20/2019, 1:41 PM

## 2019-05-17 ENCOUNTER — Other Ambulatory Visit (HOSPITAL_COMMUNITY)
Admission: RE | Admit: 2019-05-17 | Discharge: 2019-05-17 | Disposition: A | Discharge status: Home / Self Care | Payer: BC Managed Care – PPO | Source: Ambulatory Visit | Attending: Obstetrics and Gynecology | Admitting: Obstetrics and Gynecology

## 2019-05-17 ENCOUNTER — Other Ambulatory Visit: Payer: Self-pay | Admitting: Obstetrics and Gynecology

## 2019-05-17 DIAGNOSIS — Z01419 Encounter for gynecological examination (general) (routine) without abnormal findings: Secondary | ICD-10-CM | POA: Insufficient documentation

## 2019-05-18 LAB — CYTOLOGY - PAP
Chlamydia: NEGATIVE
Diagnosis: NEGATIVE
Neisseria Gonorrhea: NEGATIVE

## 2019-08-22 ENCOUNTER — Encounter (HOSPITAL_COMMUNITY): Payer: Self-pay

## 2019-08-22 ENCOUNTER — Other Ambulatory Visit: Payer: Self-pay

## 2019-08-22 ENCOUNTER — Emergency Department (HOSPITAL_COMMUNITY)
Admission: EM | Admit: 2019-08-22 | Discharge: 2019-08-22 | Disposition: A | Discharge status: Home / Self Care | Payer: BC Managed Care – PPO | Attending: Emergency Medicine | Admitting: Emergency Medicine

## 2019-08-22 DIAGNOSIS — R300 Dysuria: Secondary | ICD-10-CM | POA: Diagnosis present

## 2019-08-22 DIAGNOSIS — N3000 Acute cystitis without hematuria: Secondary | ICD-10-CM | POA: Insufficient documentation

## 2019-08-22 DIAGNOSIS — Z79899 Other long term (current) drug therapy: Secondary | ICD-10-CM | POA: Diagnosis not present

## 2019-08-22 LAB — URINALYSIS, ROUTINE W REFLEX MICROSCOPIC
Bilirubin Urine: NEGATIVE
Glucose, UA: NEGATIVE mg/dL
Hgb urine dipstick: NEGATIVE
Ketones, ur: NEGATIVE mg/dL
Nitrite: NEGATIVE
Protein, ur: 30 mg/dL — AB
Specific Gravity, Urine: 1.018 (ref 1.005–1.030)
pH: 5 (ref 5.0–8.0)

## 2019-08-22 MED ORDER — CEPHALEXIN 500 MG PO CAPS: 500.0000 mg | ORAL_CAPSULE | Freq: Two times a day (BID) | ORAL | 0 refills | Status: AC

## 2019-08-22 MED ORDER — CEPHALEXIN 500 MG PO CAPS: 500.0000 mg | ORAL_CAPSULE | Freq: Two times a day (BID) | ORAL | 0 refills | Status: DC

## 2019-08-22 NOTE — ED Triage Notes (Signed)
Patient c/o dysuria and urinary frequency x 1 day

## 2019-08-22 NOTE — ED Provider Notes (Signed)
[facility]-EMERGENCY DEPT Provider Note   CSN: [id number] Arrival date & time: 08/22/19  1616     History   Chief Complaint Chief Complaint  Patient presents with  . Dysuria  . Urinary Frequency    HPI Cassidy Bruce is a 23 y.o. female.     23 y.o female with a pMH of ADHD presents to the ED with a chief complaint of dysuria x yesterday. Patient reports she's had several episodes of dysuria, urgency, frequency since yesterday. Also endorses seeing some visible blood in her urine although she believes it was likely more so speckles.  She has not tried any medication for relieving symptoms.  Patient is currently sexually active, she reports one partner, she is currently not using any protection.  She does use of birth control with a patch.  She voices no concern for sexually transmitted infections.  She denies any back pain, fevers, chills, vaginal discharge, vaginal bleeding or concern for STIs.  The history is provided by the patient.  Dysuria Associated symptoms: no abdominal pain, no fever, no vaginal discharge and no vomiting   Urinary Frequency Pertinent negatives include no chest pain, no abdominal pain, no headaches and no shortness of breath.    Past Medical History:  Diagnosis Date  . ADHD   . Depression     Patient Active Problem List   Diagnosis Date Noted  . MDD (major depressive disorder), recurrent episode, severe (HCC) 07/25/2018  . Adjustment disorder with mixed anxiety and depressed mood     History reviewed. No pertinent surgical history.   OB History   No obstetric history on file.      Home Medications    Prior to Admission medications   Medication Sig Start Date End Date Taking? Authorizing Provider  cephALEXin (KEFLEX) 500 MG capsule Take 1 capsule (500 mg total) by mouth 2 (two) times daily for 7 days. 08/22/19 08/29/19  Claude MangesSoto, Levita Monical, PA-C  hydrOXYzine (ATARAX/VISTARIL) 25 MG tablet Take 1 tablet (25 mg total) by  mouth every 6 (six) hours as needed for anxiety. Patient not taking: Reported on 09/06/2018 07/26/18   Oneta RackLewis, Tanika N, NP  metroNIDAZOLE (FLAGYL) 500 MG tablet Take 1 tablet (500 mg total) by mouth 2 (two) times daily. One po bid x 7 days 09/24/18   Dartha LodgeFord, Kelsey N, PA-C  phenazopyridine (PYRIDIUM) 200 MG tablet Take 1 tablet (200 mg total) by mouth 3 (three) times daily. 03/17/19   Joy, Shawn C, PA-C  traZODone (DESYREL) 50 MG tablet Take 1 tablet (50 mg total) by mouth at bedtime and may repeat dose one time if needed. Patient not taking: Reported on 09/06/2018 07/26/18   Oneta RackLewis, Tanika N, NP    Family History Family History  Problem Relation Age of Onset  . Cancer Other   . Diabetes Other   . CAD Other   . Healthy Mother     Social History Social History   Tobacco Use  . Smoking status: Never Smoker  . Smokeless tobacco: Never Used  Substance Use Topics  . Alcohol use: No  . Drug use: No     Allergies   Patient has no known allergies.   Review of Systems Review of Systems  Constitutional: Negative for fever.  HENT: Negative for sore throat.   Respiratory: Negative for shortness of breath.   Cardiovascular: Negative for chest pain.  Gastrointestinal: Negative for abdominal pain and vomiting.  Genitourinary: Positive for dysuria, frequency and urgency. Negative for decreased urine volume, vaginal  bleeding and vaginal discharge.  Musculoskeletal: Negative for back pain.  Neurological: Negative for headaches.     Physical Exam Updated Vital Signs BP 133/84 (BP Location: Right Arm)   Pulse 85   Temp 98.2 F (36.8 C) (Oral)   Resp 14   Ht 5\' 4"  (1.626 m)   Wt 52.2 kg   LMP 08/12/2019   SpO2 96%   BMI 19.74 kg/m   Physical Exam Vitals signs and nursing note reviewed.  Constitutional:      General: She is not in acute distress.    Appearance: She is well-developed.  HENT:     Head: Normocephalic and atraumatic.     Mouth/Throat:     Pharynx: No oropharyngeal  exudate.  Eyes:     Pupils: Pupils are equal, round, and reactive to light.  Neck:     Musculoskeletal: Normal range of motion.  Cardiovascular:     Rate and Rhythm: Regular rhythm.     Heart sounds: Normal heart sounds.  Pulmonary:     Effort: Pulmonary effort is normal. No respiratory distress.     Breath sounds: Normal breath sounds.  Abdominal:     General: Bowel sounds are normal. There is no distension.     Palpations: Abdomen is soft.     Tenderness: There is no abdominal tenderness.     Comments: bowel sounds are within normal limits.  No tenderness to palpation on abdomen.  No CVA bilaterally.  Musculoskeletal:        General: No tenderness or deformity.     Right lower leg: No edema.     Left lower leg: No edema.  Skin:    General: Skin is warm and dry.  Neurological:     Mental Status: She is alert and oriented to person, place, and time.      ED Treatments / Results  Labs (all labs ordered are listed, but only abnormal results are displayed) Labs Reviewed  URINALYSIS, ROUTINE W REFLEX MICROSCOPIC - Abnormal; Notable for the following components:      Result Value   APPearance CLOUDY (*)    Protein, ur 30 (*)    Leukocytes,Ua MODERATE (*)    Bacteria, UA MANY (*)    All other components within normal limits    EKG None  Radiology No results found.  Procedures Procedures (including critical care time)  Medications Ordered in ED Medications - No data to display   Initial Impression / Assessment and Plan / ED Course  I have reviewed the triage vital signs and the nursing notes.  Pertinent labs & imaging results that were available during my care of the patient were reviewed by me and considered in my medical decision making (see chart for details).       ) Past medical history presents to ED with complaints of dysuria along with hematuria that began yesterday.    She reports she is sexually active, currently when one partner, had her annual exam  along with smear performed by her OB/GYN a month ago, reports she has not changed partners.  She shows no concern for STI, offered testing at this time however patient deferred.  She has not tried any medical treatment for her symptoms.  Will obtain UA in order to further evaluate patient's pain.   She has does not have any fever, pain, chills she is nontoxic-appearing, no signs of Pyelo on my exam.UA with moderate leukocytes, many bacteria but does have some squamous present and likely contaminated.  Discussed treatment at this time with patient and she is currently symptomatic from her urinary tract infection.  According to patient's chart, last urine culture showed E. coli, not resistant to Keflex.  We will treat patient at this time with the Keflex therapy of 500 mg twice daily for the next 7 days.  Patient understands and agrees with management, return precautions discussed at length.  Patient stable for discharge.   Portions of this note were generated with [employer]. Dictation errors may occur despite best attempts at proofreading.  Final Clinical Impressions(s) / ED Diagnoses   Final diagnoses:  Acute cystitis without hematuria    ED Discharge Orders         Ordered    cephALEXin (KEFLEX) 500 MG capsule  2 times daily     08/22/19 1821           Janeece Fitting, PA-C 08/22/19 1823    Daleen Bo, MD 08/23/19 [phone]

## 2019-08-22 NOTE — Discharge Instructions (Signed)
Your urine today was positive. I have prescribed antibiotics to help treat your urinary tract infection.   Please take 1 tablet twice a day for the next 7 days.  If you experience any fever, back pain, worsening symptoms please return to the ED.

## 2019-08-24 ENCOUNTER — Encounter (HOSPITAL_COMMUNITY): Payer: Self-pay

## 2019-08-24 ENCOUNTER — Emergency Department (HOSPITAL_COMMUNITY)
Admission: EM | Admit: 2019-08-24 | Discharge: 2019-08-24 | Disposition: A | Discharge status: Home / Self Care | Payer: BC Managed Care – PPO | Attending: Emergency Medicine | Admitting: Emergency Medicine

## 2019-08-24 ENCOUNTER — Emergency Department (HOSPITAL_COMMUNITY): Payer: BC Managed Care – PPO

## 2019-08-24 DIAGNOSIS — N390 Urinary tract infection, site not specified: Secondary | ICD-10-CM | POA: Diagnosis not present

## 2019-08-24 DIAGNOSIS — N730 Acute parametritis and pelvic cellulitis: Secondary | ICD-10-CM | POA: Diagnosis not present

## 2019-08-24 DIAGNOSIS — R569 Unspecified convulsions: Secondary | ICD-10-CM

## 2019-08-24 DIAGNOSIS — Z79899 Other long term (current) drug therapy: Secondary | ICD-10-CM | POA: Diagnosis not present

## 2019-08-24 LAB — COMPREHENSIVE METABOLIC PANEL
ALT: 12 U/L (ref 0–44)
AST: 17 U/L (ref 15–41)
Albumin: 3.6 g/dL (ref 3.5–5.0)
Alkaline Phosphatase: 50 U/L (ref 38–126)
Anion gap: 8 (ref 5–15)
BUN: 11 mg/dL (ref 6–20)
CO2: 26 mmol/L (ref 22–32)
Calcium: 8.7 mg/dL — ABNORMAL LOW (ref 8.9–10.3)
Chloride: 105 mmol/L (ref 98–111)
Creatinine, Ser: 0.85 mg/dL (ref 0.44–1.00)
GFR calc Af Amer: >60 mL/min (ref >60)
GFR calc non Af Amer: >60 mL/min (ref >60)
Glucose, Bld: 79 mg/dL (ref 70–99)
Potassium: 4 mmol/L (ref 3.5–5.1)
Sodium: 139 mmol/L (ref 135–145)
Total Bilirubin: 0.4 mg/dL (ref 0.3–1.2)
Total Protein: 6.8 g/dL (ref 6.5–8.1)

## 2019-08-24 LAB — URINALYSIS, ROUTINE W REFLEX MICROSCOPIC
Bacteria, UA: NONE SEEN
Bilirubin Urine: NEGATIVE
Glucose, UA: NEGATIVE mg/dL
Ketones, ur: NEGATIVE mg/dL
Nitrite: NEGATIVE
Protein, ur: 100 mg/dL — AB
RBC / HPF: >50 RBC/hpf — ABNORMAL HIGH (ref 0–5)
Specific Gravity, Urine: 1.02 (ref 1.005–1.030)
WBC, UA: >50 WBC/hpf — ABNORMAL HIGH (ref 0–5)
pH: 7 (ref 5.0–8.0)

## 2019-08-24 LAB — URINE CULTURE: Culture: 60000 — AB

## 2019-08-24 LAB — LIPASE, BLOOD: Lipase: 23 U/L (ref 11–51)

## 2019-08-24 LAB — CBC WITH DIFFERENTIAL/PLATELET
Abs Immature Granulocytes: 0.01 10*3/uL (ref 0.00–0.07)
Basophils Absolute: 0.1 10*3/uL (ref 0.0–0.1)
Basophils Relative: 1 %
Eosinophils Absolute: 0 10*3/uL (ref 0.0–0.5)
Eosinophils Relative: 1 %
HCT: 35.3 % — ABNORMAL LOW (ref 36.0–46.0)
Hemoglobin: 11.1 g/dL — ABNORMAL LOW (ref 12.0–15.0)
Immature Granulocytes: 0 %
Lymphocytes Relative: 20 %
Lymphs Abs: 1.1 10*3/uL (ref 0.7–4.0)
MCH: 24.2 pg — ABNORMAL LOW (ref 26.0–34.0)
MCHC: 31.4 g/dL (ref 30.0–36.0)
MCV: 77.1 fL — ABNORMAL LOW (ref 80.0–100.0)
Monocytes Absolute: 0.5 10*3/uL (ref 0.1–1.0)
Monocytes Relative: 8 %
Neutro Abs: 3.9 10*3/uL (ref 1.7–7.7)
Neutrophils Relative %: 70 %
Platelets: 126 10*3/uL — ABNORMAL LOW (ref 150–400)
RBC: 4.58 MIL/uL (ref 3.87–5.11)
RDW: 15.2 % (ref 11.5–15.5)
WBC: 5.6 10*3/uL (ref 4.0–10.5)
nRBC: 0 % (ref 0.0–0.2)

## 2019-08-24 LAB — WET PREP, GENITAL
Sperm: NONE SEEN
Trich, Wet Prep: NONE SEEN
WBC, Wet Prep HPF POC: NONE SEEN
Yeast Wet Prep HPF POC: NONE SEEN

## 2019-08-24 LAB — RAPID URINE DRUG SCREEN, HOSP PERFORMED
Amphetamines: NOT DETECTED
Barbiturates: NOT DETECTED
Benzodiazepines: NOT DETECTED
Cocaine: NOT DETECTED
Opiates: NOT DETECTED
Tetrahydrocannabinol: NOT DETECTED

## 2019-08-24 LAB — HIV ANTIBODY (ROUTINE TESTING W REFLEX): HIV Screen 4th Generation wRfx: NONREACTIVE

## 2019-08-24 LAB — CBG MONITORING, ED: Glucose-Capillary: 83 mg/dL (ref 70–99)

## 2019-08-24 LAB — PREGNANCY, URINE: Preg Test, Ur: NEGATIVE

## 2019-08-24 MED ORDER — LEVETIRACETAM 500 MG PO TABS: 500.0000 mg | ORAL_TABLET | Freq: Two times a day (BID) | ORAL | 1 refills | Status: DC

## 2019-08-24 MED ORDER — LEVETIRACETAM 500 MG PO TABS
500.0000 mg | ORAL_TABLET | Freq: Once | ORAL | Status: AC
  Administered 2019-08-24: 17:00:00 500 mg via ORAL
  Filled 2019-08-24: qty 1

## 2019-08-24 MED ORDER — DOXYCYCLINE HYCLATE 100 MG PO TABS
100.0000 mg | ORAL_TABLET | Freq: Once | ORAL | Status: AC
  Administered 2019-08-24: 16:00:00 100 mg via ORAL
  Filled 2019-08-24: qty 1

## 2019-08-24 MED ORDER — ONDANSETRON HCL 4 MG/2ML IJ SOLN
4.0000 mg | Freq: Once | INTRAMUSCULAR | Status: AC
  Administered 2019-08-24: 4 mg via INTRAVENOUS
  Filled 2019-08-24: qty 2

## 2019-08-24 MED ORDER — NAPROXEN 375 MG PO TABS: 375.0000 mg | ORAL_TABLET | Freq: Two times a day (BID) | ORAL | 0 refills | Status: DC

## 2019-08-24 MED ORDER — SODIUM CHLORIDE 0.9 % IV BOLUS
1000.0000 mL | Freq: Once | INTRAVENOUS | Status: AC
  Administered 2019-08-24: 15:00:00 1000 mL via INTRAVENOUS

## 2019-08-24 MED ORDER — METRONIDAZOLE 500 MG PO TABS: 500.0000 mg | ORAL_TABLET | Freq: Two times a day (BID) | ORAL | 0 refills | Status: DC

## 2019-08-24 MED ORDER — MORPHINE SULFATE (PF) 4 MG/ML IV SOLN
4.0000 mg | Freq: Once | INTRAVENOUS | Status: AC
  Administered 2019-08-24: 4 mg via INTRAVENOUS
  Filled 2019-08-24: qty 1

## 2019-08-24 MED ORDER — METRONIDAZOLE 500 MG PO TABS
500.0000 mg | ORAL_TABLET | Freq: Once | ORAL | Status: AC
  Administered 2019-08-24: 500 mg via ORAL
  Filled 2019-08-24: qty 1

## 2019-08-24 MED ORDER — ONDANSETRON 4 MG PO TBDP: 4.0000 mg | ORAL_TABLET | Freq: Three times a day (TID) | ORAL | 0 refills | Status: DC | PRN

## 2019-08-24 MED ORDER — DOXYCYCLINE HYCLATE 100 MG PO CAPS: 100.0000 mg | ORAL_CAPSULE | Freq: Two times a day (BID) | ORAL | 0 refills | Status: DC

## 2019-08-24 MED ORDER — STERILE WATER FOR INJECTION IJ SOLN
INTRAMUSCULAR | Status: AC
  Administered 2019-08-24: 16:00:00 10 mL
  Filled 2019-08-24: qty 10

## 2019-08-24 MED ORDER — CEFTRIAXONE SODIUM 250 MG IJ SOLR
250.0000 mg | Freq: Once | INTRAMUSCULAR | Status: AC
  Administered 2019-08-24: 250 mg via INTRAMUSCULAR
  Filled 2019-08-24: qty 250

## 2019-08-24 NOTE — ED Provider Notes (Signed)
[facility] DEPT Provider Note   CSN: [id number] Arrival date & time: 08/24/19  1355     History   Chief Complaint Chief Complaint  Patient presents with  . Seizures  . Abdominal Pain    HPI Cassidy Bruce is a 23 y.o. female.     HPI  23 year old female presents with seizure.  History is by EMS and patient.  Patient states she has been having dysuria and UTI and was diagnosed with this on 10/19 and given antibiotics.  Today she is been having lower abdominal pain that she describes as 10/10.  She has been having nausea this morning.  She was lightheaded while washing dishes in the next thing she knows she collapsed.  She states she woke up in the ambulance.  EMS states coworkers describe seizure-like activity and they witnessed about 20-30 seconds of shaking that appeared to be a seizure to them in the ambulance.  Patient denies headache, tongue injury, incontinence.  She has not had a fever.  She denies any concern for STI though she has had a little bit of discharge.  She is noticing some blood in her urine starting today.  States she has a previous history of seizures though has never seen a neurologist or been on antiepileptics.  Past Medical History:  Diagnosis Date  . ADHD   . Depression     Patient Active Problem List   Diagnosis Date Noted  . MDD (major depressive disorder), recurrent episode, severe ([place]) 07/25/2018  . Adjustment disorder with mixed anxiety and depressed mood     History reviewed. No pertinent surgical history.   OB History   No obstetric history on file.      Home Medications    Prior to Admission medications   Medication Sig Start Date End Date Taking? Authorizing Provider  cephALEXin (KEFLEX) 500 MG capsule Take 1 capsule (500 mg total) by mouth 2 (two) times daily for 7 days. 08/22/19 08/29/19  Janeece Fitting, PA-C  doxycycline (VIBRAMYCIN) 100 MG capsule Take 1 capsule (100 mg total) by mouth 2 (two) times  daily. 08/24/19   Sherwood Gambler, MD  hydrOXYzine (ATARAX/VISTARIL) 25 MG tablet Take 1 tablet (25 mg total) by mouth every 6 (six) hours as needed for anxiety. Patient not taking: Reported on 09/06/2018 07/26/18   Derrill Center, NP  levETIRAcetam (KEPPRA) 500 MG tablet Take 1 tablet (500 mg total) by mouth 2 (two) times daily. 08/24/19   Sherwood Gambler, MD  metroNIDAZOLE (FLAGYL) 500 MG tablet Take 1 tablet (500 mg total) by mouth 2 (two) times daily. 08/24/19   Sherwood Gambler, MD  naproxen (NAPROSYN) 375 MG tablet Take 1 tablet (375 mg total) by mouth 2 (two) times daily with a meal. 08/24/19   Sherwood Gambler, MD  ondansetron (ZOFRAN ODT) 4 MG disintegrating tablet Take 1 tablet (4 mg total) by mouth every 8 (eight) hours as needed for nausea or vomiting. 08/24/19   Sherwood Gambler, MD  phenazopyridine (PYRIDIUM) 200 MG tablet Take 1 tablet (200 mg total) by mouth 3 (three) times daily. 03/17/19   Joy, Shawn C, PA-C  traZODone (DESYREL) 50 MG tablet Take 1 tablet (50 mg total) by mouth at bedtime and may repeat dose one time if needed. Patient not taking: Reported on 09/06/2018 07/26/18   Derrill Center, NP    Family History Family History  Problem Relation Age of Onset  . Cancer Other   . Diabetes Other   . CAD Other   .  Healthy Mother     Social History Social History   Tobacco Use  . Smoking status: Never Smoker  . Smokeless tobacco: Never Used  Substance Use Topics  . Alcohol use: No  . Drug use: No     Allergies   Patient has no known allergies.   Review of Systems Review of Systems  Constitutional: Negative for fever.  Respiratory: Negative for shortness of breath.   Cardiovascular: Negative for chest pain and palpitations.  Gastrointestinal: Positive for abdominal pain and nausea. Negative for vomiting.  Genitourinary: Positive for dysuria, hematuria and vaginal discharge.  Neurological: Positive for seizures. Negative for weakness, numbness and headaches.  All  other systems reviewed and are negative.    Physical Exam Updated Vital Signs BP (!) 135/109   Pulse 77   Temp 98.2 F (36.8 C) (Oral)   Resp 16   Wt 52.2 kg   LMP 08/12/2019   SpO2 99%   BMI 19.75 kg/m   Physical Exam Vitals signs and nursing note reviewed. Exam conducted with a chaperone present.  Constitutional:      General: She is not in acute distress.    Appearance: She is well-developed. She is not ill-appearing or diaphoretic.     Interventions: Cervical collar in place.  HENT:     Head: Normocephalic and atraumatic.     Right Ear: External ear normal.     Left Ear: External ear normal.     Nose: Nose normal.  Eyes:     General:        Right eye: No discharge.        Left eye: No discharge.     Extraocular Movements: Extraocular movements intact.     Pupils: Pupils are equal, round, and reactive to light.  Neck:     Musculoskeletal: Normal range of motion. Neck rigidity present. No spinous process tenderness or muscular tenderness.  Cardiovascular:     Rate and Rhythm: Normal rate and regular rhythm.     Heart sounds: Normal heart sounds.  Pulmonary:     Effort: Pulmonary effort is normal.     Breath sounds: Normal breath sounds.  Abdominal:     Palpations: Abdomen is soft.     Tenderness: There is abdominal tenderness in the right lower quadrant, suprapubic area and left lower quadrant. There is no right CVA tenderness or left CVA tenderness.  Genitourinary:    Vagina: Vaginal discharge present.     Cervix: No cervical motion tenderness.     Uterus: Tender.      Adnexa:        Right: No mass or tenderness.         Left: No mass or tenderness.    Skin:    General: Skin is warm and dry.  Neurological:     Mental Status: She is alert and oriented to person, place, and time.     Comments: CN 3-12 grossly intact. 5/5 strength in all 4 extremities but poor effort in her lower extremities. However she can walk to the bathroom without difficulty. Grossly  normal sensation. Normal finger to nose.   Psychiatric:        Mood and Affect: Mood is not anxious.      ED Treatments / Results  Labs (all labs ordered are listed, but only abnormal results are displayed) Labs Reviewed  WET PREP, GENITAL - Abnormal; Notable for the following components:      Result Value   Clue Cells Wet Prep HPF POC  PRESENT (*)    All other components within normal limits  COMPREHENSIVE METABOLIC PANEL - Abnormal; Notable for the following components:   Calcium 8.7 (*)    All other components within normal limits  CBC WITH DIFFERENTIAL/PLATELET - Abnormal; Notable for the following components:   Hemoglobin 11.1 (*)    HCT 35.3 (*)    MCV 77.1 (*)    MCH 24.2 (*)    Platelets 126 (*)    All other components within normal limits  URINALYSIS, ROUTINE W REFLEX MICROSCOPIC - Abnormal; Notable for the following components:   APPearance TURBID (*)    Hgb urine dipstick LARGE (*)    Protein, ur 100 (*)    Leukocytes,Ua MODERATE (*)    RBC / HPF >50 (*)    WBC, UA >50 (*)    All other components within normal limits  LIPASE, BLOOD  PREGNANCY, URINE  RAPID URINE DRUG SCREEN, HOSP PERFORMED  RPR  HIV ANTIBODY (ROUTINE TESTING W REFLEX)  CBG MONITORING, ED  GC/CHLAMYDIA PROBE AMP ([place]) NOT AT [facility]    EKG EKG Interpretation  Date/Time:  Wednesday August 24 2019 14:50:59 EDT Ventricular Rate:  59 PR Interval:    QRS Duration: 92 QT Interval:  445 QTC Calculation: 441 R Axis:   46 Text Interpretation:  Sinus rhythm no acute ST/T changes similar to May 2019 Confirmed by Pricilla Loveless [phone]) on 08/24/2019 3:13:30 PM   Radiology Ct Head Wo Contrast  Result Date: 08/24/2019 CLINICAL DATA:  Seizure today. EXAM: CT HEAD WITHOUT CONTRAST TECHNIQUE: Contiguous axial images were obtained from the base of the skull through the vertex without intravenous contrast. COMPARISON:  03/01/2017 FINDINGS: Brain: No evidence of acute infarction, hemorrhage,  hydrocephalus, extra-axial collection or mass lesion/mass effect. Vascular: No hyperdense vessel or unexpected calcification. Skull: Normal. Negative for fracture or focal lesion. Sinuses/Orbits: Normal. Other: None IMPRESSION: Normal exam. Electronically Signed   By: Francene Boyers M.D.   On: 08/24/2019 16:09    Procedures Procedures (including critical care time)  Medications Ordered in ED Medications  sodium chloride 0.9 % bolus 1,000 mL (0 mLs Intravenous Stopped 08/24/19 1600)  morphine 4 MG/ML injection 4 mg (4 mg Intravenous Given 08/24/19 1441)  ondansetron (ZOFRAN) injection 4 mg (4 mg Intravenous Given 08/24/19 1440)  cefTRIAXone (ROCEPHIN) injection 250 mg (250 mg Intramuscular Given 08/24/19 1613)  sterile water (preservative free) injection (10 mLs  Given 08/24/19 1613)  metroNIDAZOLE (FLAGYL) tablet 500 mg (500 mg Oral Given 08/24/19 1622)  doxycycline (VIBRA-TABS) tablet 100 mg (100 mg Oral Given 08/24/19 1622)  levETIRAcetam (KEPPRA) tablet 500 mg (500 mg Oral Given 08/24/19 1638)     Initial Impression / Assessment and Plan / ED Course  I have reviewed the triage vital signs and the nursing notes.  Pertinent labs & imaging results that were available during my care of the patient were reviewed by me and considered in my medical decision making (see chart for details).        Patient's pelvic pain is likely either PID versus her acute cystitis.  Doubt pyelonephritis.  Her lab work is pretty reassuring.  On repeat exam its midline and there is no right lower quadrant tenderness or left lower quadrant tenderness to suggest appendicitis, TOA, etc.  She is feeling much better.  At this point, from an abdominal standpoint I think treating her for PID would be reasonable.  As far as the seizure-like activity, she was seen once before and diagnosed with syncope.  Given  there was a period that she does not remember after the passing out episode and shaking seen by both EMS and  bystanders, she could indeed have seizures.  I will treat her with Keppra but she does not appear to need emergent admission or emergent work-up.  Follow-up with neurology.  She was counseled she cannot drive for 6 months following her most recent seizure and needs to see neuro.  Final Clinical Impressions(s) / ED Diagnoses   Final diagnoses:  PID (acute pelvic inflammatory disease)  Acute UTI (urinary tract infection)  Seizure-like activity [facility](HCC)    ED Discharge Orders         Ordered    Ambulatory referral to Neurology    Comments: An appointment is requested in approximately: 2 weeks   08/24/19 1636    levETIRAcetam (KEPPRA) 500 MG tablet  2 times daily     08/24/19 1640    doxycycline (VIBRAMYCIN) 100 MG capsule  2 times daily     08/24/19 1640    metroNIDAZOLE (FLAGYL) 500 MG tablet  2 times daily     08/24/19 1640    naproxen (NAPROSYN) 375 MG tablet  2 times daily with meals     08/24/19 1641    ondansetron (ZOFRAN ODT) 4 MG disintegrating tablet  Every 8 hours PRN     08/24/19 1641           Pricilla LovelessGoldston, Jemmie Rhinehart, MD 08/24/19 1854

## 2019-08-24 NOTE — ED Notes (Signed)
Pelvic set up at beside and ready.

## 2019-08-24 NOTE — ED Notes (Signed)
Pt also c/o abd pain and nausea

## 2019-08-24 NOTE — Progress Notes (Signed)
Chart review: 3 ED visits in six months/no PCP  TOC CM spoke to pt and she does not have a PCP. Agreeable to CM arranging appt to establish with new PCP. Appt arranged with [place] at [facility] on 09/01/2019 at 9 am. Provided pt with brochure. [place], [place] ED TOC CM [phone]

## 2019-08-24 NOTE — ED Notes (Signed)
Pt c/o difficulty lifting right leg.

## 2019-08-24 NOTE — ED Triage Notes (Signed)
Pt BIBA from work. Pt collapsed to ground, with reported seizure activity. Pt placed in c collar due to unresponsiveness on scene. 20 seconds of seizure activity with EMS, with brief post ictal state.

## 2019-08-24 NOTE — Discharge Instructions (Addendum)
You are being treated for a possible seizure.  By [facility] law you cannot drive for 6 months after your most recent seizure episode.  It is very important to follow-up with a neurologist.  You are being prescribed naproxen.  Do not take other NSAIDs such as ibuprofen, Advil, Motrin, Aleve, Naprosyn, etc.  If you develop worsening, continued, or recurrent abdominal pain, uncontrolled vomiting, fever, chest or back pain, or any other new/concerning symptoms then return to the ER for evaluation.

## 2019-08-25 ENCOUNTER — Telehealth: Payer: Self-pay

## 2019-08-25 LAB — GC/CHLAMYDIA PROBE AMP (~~LOC~~) NOT AT ARMC
Chlamydia: NEGATIVE
Neisseria Gonorrhea: NEGATIVE

## 2019-08-25 LAB — RPR: RPR Ser Ql: NONREACTIVE

## 2019-08-25 NOTE — Telephone Encounter (Signed)
Post ED Visit - Positive Culture Follow-up  Culture report reviewed by antimicrobial stewardship pharmacist: [facility] Team []  Elenor Quinones, Pharm.D. []  Heide Guile, Pharm.D., BCPS AQ-ID []  Parks Neptune, Pharm.D., BCPS []  Alycia Rossetti, Pharm.D., BCPS []  [place], Pharm.D., BCPS, AAHIVP []  Legrand Como, Pharm.D., BCPS, AAHIVP []  Salome Arnt, PharmD, BCPS []  Johnnette Gourd, PharmD, BCPS []  Hughes Better, PharmD, BCPS []  Leeroy Cha, PharmD []  Laqueta Linden, PharmD, BCPS []  Albertina Parr, PharmD  [place] Team [x]  Leodis Sias, PharmD []  Lindell Spar, PharmD []  Royetta Asal, PharmD []  Graylin Shiver, Rph []  Rema Fendt) Glennon Mac, PharmD []  Arlyn Dunning, PharmD []  Netta Cedars, PharmD []  Dia Sitter, PharmD []  Leone Haven, PharmD []  Gretta Arab, PharmD []  Theodis Shove, PharmD []  Peggyann Juba, PharmD []  Reuel Boom, PharmD   Positive urine culture Treated with Cephalexin, organism sensitive to the same and no further patient follow-up is required at this time.  Genia Del 08/25/2019, 10:54 AM

## 2019-09-01 ENCOUNTER — Ambulatory Visit: Payer: BC Managed Care – PPO | Admitting: Nurse Practitioner

## 2019-11-03 ENCOUNTER — Other Ambulatory Visit: Payer: Self-pay

## 2019-11-03 ENCOUNTER — Telehealth: Payer: Self-pay | Admitting: Neurology

## 2020-01-22 ENCOUNTER — Emergency Department (HOSPITAL_COMMUNITY): Payer: Self-pay

## 2020-01-22 ENCOUNTER — Emergency Department (HOSPITAL_COMMUNITY)
Admission: EM | Admit: 2020-01-22 | Discharge: 2020-01-22 | Disposition: A | Discharge status: Home / Self Care | Payer: Self-pay | Attending: Emergency Medicine | Admitting: Emergency Medicine

## 2020-01-22 ENCOUNTER — Other Ambulatory Visit: Payer: Self-pay

## 2020-01-22 DIAGNOSIS — F909 Attention-deficit hyperactivity disorder, unspecified type: Secondary | ICD-10-CM | POA: Insufficient documentation

## 2020-01-22 DIAGNOSIS — Z79899 Other long term (current) drug therapy: Secondary | ICD-10-CM | POA: Insufficient documentation

## 2020-01-22 DIAGNOSIS — S8002XA Contusion of left knee, initial encounter: Secondary | ICD-10-CM | POA: Insufficient documentation

## 2020-01-22 DIAGNOSIS — Y999 Unspecified external cause status: Secondary | ICD-10-CM | POA: Insufficient documentation

## 2020-01-22 DIAGNOSIS — S60812A Abrasion of left wrist, initial encounter: Secondary | ICD-10-CM | POA: Insufficient documentation

## 2020-01-22 DIAGNOSIS — Y929 Unspecified place or not applicable: Secondary | ICD-10-CM | POA: Insufficient documentation

## 2020-01-22 DIAGNOSIS — Z23 Encounter for immunization: Secondary | ICD-10-CM | POA: Insufficient documentation

## 2020-01-22 DIAGNOSIS — S50312A Abrasion of left elbow, initial encounter: Secondary | ICD-10-CM | POA: Insufficient documentation

## 2020-01-22 DIAGNOSIS — Y939 Activity, unspecified: Secondary | ICD-10-CM | POA: Insufficient documentation

## 2020-01-22 MED ORDER — IBUPROFEN 400 MG PO TABS
400.0000 mg | ORAL_TABLET | Freq: Once | ORAL | Status: AC
  Administered 2020-01-22: 400 mg via ORAL
  Filled 2020-01-22: qty 1

## 2020-01-22 MED ORDER — TETANUS-DIPHTH-ACELL PERTUSSIS 5-2.5-18.5 LF-MCG/0.5 IM SUSP
0.5000 mL | Freq: Once | INTRAMUSCULAR | Status: AC
  Administered 2020-01-22: 01:00:00 0.5 mL via INTRAMUSCULAR
  Filled 2020-01-22 (×2): qty 0.5
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
## 2020-01-22 NOTE — ED Notes (Signed)
Pt transported to XR.  

## 2020-01-22 NOTE — Discharge Instructions (Addendum)
Apply ice to any sore area.  Take ibuprofen or acetaminophen as needed for pain.  Wear the knee sleeve, use crutches as needed.  See your knee doctor in 3-4 days.  Return if you are having any problems.

## 2020-01-22 NOTE — ED Notes (Signed)
Discharge instructions discussed with pt. Pt verbalized understanding with no questions at this time.  
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
## 2020-03-12 ENCOUNTER — Other Ambulatory Visit: Payer: Self-pay

## 2020-03-12 ENCOUNTER — Encounter (HOSPITAL_COMMUNITY): Payer: Self-pay

## 2020-03-12 ENCOUNTER — Emergency Department (HOSPITAL_COMMUNITY)
Admission: EM | Admit: 2020-03-12 | Discharge: 2020-03-12 | Disposition: A | Discharge status: Home / Self Care | Payer: Self-pay | Attending: Emergency Medicine | Admitting: Emergency Medicine

## 2020-03-12 DIAGNOSIS — N898 Other specified noninflammatory disorders of vagina: Secondary | ICD-10-CM | POA: Insufficient documentation

## 2020-03-12 DIAGNOSIS — Z5321 Procedure and treatment not carried out due to patient leaving prior to being seen by health care provider: Secondary | ICD-10-CM | POA: Insufficient documentation

## 2020-03-12 NOTE — ED Triage Notes (Signed)
Patient reports that she began hving vaginal itching yeserday and worse today, but is also irritated. Patient denies vaginal drainage.

## 2020-03-13 ENCOUNTER — Encounter (HOSPITAL_COMMUNITY): Payer: Self-pay | Admitting: Emergency Medicine

## 2020-03-13 ENCOUNTER — Emergency Department (HOSPITAL_COMMUNITY)
Admission: EM | Admit: 2020-03-13 | Discharge: 2020-03-13 | Disposition: A | Discharge status: Home / Self Care | Payer: Self-pay | Attending: Emergency Medicine | Admitting: Emergency Medicine

## 2020-03-13 ENCOUNTER — Other Ambulatory Visit: Payer: Self-pay

## 2020-03-13 DIAGNOSIS — N898 Other specified noninflammatory disorders of vagina: Secondary | ICD-10-CM | POA: Insufficient documentation

## 2020-03-13 LAB — URINALYSIS, ROUTINE W REFLEX MICROSCOPIC
Bilirubin Urine: NEGATIVE
Glucose, UA: NEGATIVE mg/dL
Hgb urine dipstick: NEGATIVE
Ketones, ur: NEGATIVE mg/dL
Nitrite: NEGATIVE
Protein, ur: NEGATIVE mg/dL
Specific Gravity, Urine: 1.014 (ref 1.005–1.030)
pH: 7 (ref 5.0–8.0)

## 2020-03-13 LAB — PREGNANCY, URINE: Preg Test, Ur: NEGATIVE

## 2020-03-13 LAB — WET PREP, GENITAL
Clue Cells Wet Prep HPF POC: NONE SEEN
Sperm: NONE SEEN
Trich, Wet Prep: NONE SEEN
Yeast Wet Prep HPF POC: NONE SEEN

## 2020-03-13 MED ORDER — FLUCONAZOLE 150 MG PO TABS: 150.0000 mg | ORAL_TABLET | Freq: Once | ORAL | 0 refills | Status: AC

## 2020-03-13 NOTE — ED Triage Notes (Signed)
Patient here from home with complaints of vag itching that started on Sunday. Denies burning or abnormal discharge. Denies new sexual partners.

## 2020-03-13 NOTE — ED Notes (Signed)
Pt given cup for UA.

## 2020-03-13 NOTE — ED Notes (Signed)
Pelvic cart at bedside. 

## 2020-03-13 NOTE — ED Provider Notes (Signed)
[place] DEPT Provider Note   CSN: [id number] Arrival date & time: 03/13/20  1412     History Chief Complaint  Patient presents with  . Vaginal Itching    Cassidy Bruce is a 24 y.o. female with no pertinent past medical history who presents today for evaluation of vaginal itching.  She reports that she has had vaginal itching for 4 days.  She was last sexually active with her boyfriend 5 days ago.  She states that the do not use condoms and she is not currently on any pregnancy prevention however does not wish to get pregnant.  She states that they have been together for 3 years and she is not concerned about sexually transmitted infections.  She denies any lesions.  She denies any changes to soaps, lotions, laundry detergents or other products.  She denies any history of similar.  No pelvic pain and fevers.  She reports the itching feels like it is inside her vagina rather than on the vulva. She denies dysuria, increased frequency or urgency.  She reports she has normal vaginal discharge with out increased volume, change in color, odor or consistency.   She reports she has an appointment with her OB/GYN next  Month.    HPI     Past Medical History:  Diagnosis Date  . ADHD   . Depression     Patient Active Problem List   Diagnosis Date Noted  . MDD (major depressive disorder), recurrent episode, severe ([place]) 07/25/2018  . Adjustment disorder with mixed anxiety and depressed mood     History reviewed. No pertinent surgical history.   OB History   No obstetric history on file.     Family History  Problem Relation Age of Onset  . Cancer Other   . Diabetes Other   . CAD Other   . Healthy Mother     Social History   Tobacco Use  . Smoking status: Never Smoker  . Smokeless tobacco: Never Used  Substance Use Topics  . Alcohol use: No  . Drug use: No    Home Medications Prior to Admission medications   Medication Sig Start  Date End Date Taking? Authorizing Provider  fluconazole (DIFLUCAN) 150 MG tablet Take 1 tablet (150 mg total) by mouth once for 1 dose. 03/13/20 03/13/20  Lorin Glass, PA-C  levETIRAcetam (KEPPRA) 500 MG tablet Take 1 tablet (500 mg total) by mouth 2 (two) times daily. Patient not taking: Reported on 01/22/2020 08/24/19   Sherwood Gambler, MD  traZODone (DESYREL) 50 MG tablet Take 1 tablet (50 mg total) by mouth at bedtime and may repeat dose one time if needed. Patient not taking: Reported on 09/06/2018 07/26/18   Derrill Center, NP    Allergies    Patient has no known allergies.  Review of Systems   Review of Systems  Constitutional: Negative for chills and fever.  Respiratory: Negative for chest tightness and shortness of breath.   Genitourinary: Negative for decreased urine volume, dysuria, flank pain, frequency, pelvic pain, urgency, vaginal bleeding, vaginal discharge and vaginal pain.  Skin: Negative for color change and wound.  Neurological: Negative for weakness.  All other systems reviewed and are negative.   Physical Exam Updated Vital Signs BP (!) 102/58   Pulse 66   Temp 98.4 F (36.9 C) (Oral)   Resp 19   LMP 02/15/2020   SpO2 98%   Physical Exam Vitals and nursing note reviewed. Exam conducted with a chaperone present.  Constitutional:      General: She is not in acute distress.    Appearance: She is well-developed. She is not diaphoretic.  HENT:     Head: Normocephalic and atraumatic.  Eyes:     General: No scleral icterus.       Right eye: No discharge.        Left eye: No discharge.     Conjunctiva/sclera: Conjunctivae normal.  Cardiovascular:     Rate and Rhythm: Normal rate and regular rhythm.  Pulmonary:     Effort: Pulmonary effort is normal. No respiratory distress.     Breath sounds: No stridor.  Abdominal:     General: There is no distension.  Genitourinary:    Comments: Normal external female genitalia.  There is a copious amount of  thick, cottage cheeselike discharge in the vaginal canal. As patient denied pelvic pain bimanual exam was not performed.  Musculoskeletal:        General: No deformity.     Cervical back: Normal range of motion.  Skin:    General: Skin is warm and dry.  Neurological:     Mental Status: She is alert.     Motor: No abnormal muscle tone.  Psychiatric:        Mood and Affect: Mood normal.        Behavior: Behavior normal.     ED Results / Procedures / Treatments   Labs (all labs ordered are listed, but only abnormal results are displayed) Labs Reviewed  WET PREP, GENITAL - Abnormal; Notable for the following components:      Result Value   WBC, Wet Prep HPF POC RARE (*)    All other components within normal limits  URINALYSIS, ROUTINE W REFLEX MICROSCOPIC - Abnormal; Notable for the following components:   APPearance CLOUDY (*)    Leukocytes,Ua LARGE (*)    Bacteria, UA RARE (*)    All other components within normal limits  PREGNANCY, URINE  GC/CHLAMYDIA PROBE AMP ([place]) NOT AT [facility]    EKG None  Radiology No results found.  Procedures Procedures (including critical care time)  Medications Ordered in ED Medications - No data to display  ED Course  I have reviewed the triage vital signs and the nursing notes.  Pertinent labs & imaging results that were available during my care of the patient were reviewed by me and considered in my medical decision making (see chart for details).    MDM Rules/Calculators/A&P                     Patient is a 24 year old woman who presents today for evaluation of 4 days of vaginal itching.  She denies any pelvic pain.  Pregnancy test is negative.  No dysuria increased frequency urgency or other symptoms concerning for UTI.  Urine is reviewed, no indication for treatment for UTI at this time.  Pelvic exam was performed with chaperone present, wet prep and GC testing is obtained and sent.  All wet prep does not show yeast on  clinical exam is consistent with yeast as patient has a large amount of thick, chunky vaginal discharge in the vaginal canal.  When her pruritus we will treat with single dose of Diflucan.  Recommended OB/GYN follow-up.  Return precautions were discussed with patient who states their understanding.  At the time of discharge patient denied any unaddressed complaints or concerns.  Patient is agreeable for discharge home.  Note: Portions of this report may have been  transcribed using voice recognition software. Every effort was made to ensure accuracy; however, inadvertent computerized transcription errors may be present  Final Clinical Impression(s) / ED Diagnoses Final diagnoses:  Vaginal itching    Rx / DC Orders ED Discharge Orders         Ordered    fluconazole (DIFLUCAN) 150 MG tablet   Once     03/13/20 2002           Norman Clay 03/13/20 2228    Terrilee Files, MD 03/14/20 1308

## 2020-03-13 NOTE — Discharge Instructions (Signed)
Today your wet prep did not show anything significantly abnormal, however based on your symptoms and clinical exam I will treat you for a yeast infection.  If this does not resolve your symptoms please schedule a follow-up appointment with your OB/GYN.  You have test for gonorrhea and chlamydia pending.  If they are positive you will be called and you will need to seek additional treatment.

## 2020-03-14 LAB — GC/CHLAMYDIA PROBE AMP (~~LOC~~) NOT AT ARMC
Chlamydia: NEGATIVE
Comment: NEGATIVE
Comment: NORMAL
Neisseria Gonorrhea: NEGATIVE

## 2020-05-15 DIAGNOSIS — N898 Other specified noninflammatory disorders of vagina: Secondary | ICD-10-CM | POA: Diagnosis not present

## 2020-05-15 DIAGNOSIS — Z01419 Encounter for gynecological examination (general) (routine) without abnormal findings: Secondary | ICD-10-CM | POA: Diagnosis not present

## 2020-06-12 DIAGNOSIS — Z3202 Encounter for pregnancy test, result negative: Secondary | ICD-10-CM | POA: Diagnosis not present

## 2020-06-12 DIAGNOSIS — Z308 Encounter for other contraceptive management: Secondary | ICD-10-CM | POA: Diagnosis not present

## 2020-07-10 DIAGNOSIS — Z113 Encounter for screening for infections with a predominantly sexual mode of transmission: Secondary | ICD-10-CM | POA: Diagnosis not present

## 2020-07-10 DIAGNOSIS — N941 Unspecified dyspareunia: Secondary | ICD-10-CM | POA: Diagnosis not present

## 2020-07-10 DIAGNOSIS — N898 Other specified noninflammatory disorders of vagina: Secondary | ICD-10-CM | POA: Diagnosis not present

## 2021-02-20 ENCOUNTER — Emergency Department (HOSPITAL_COMMUNITY)
Admission: EM | Admit: 2021-02-20 | Discharge: 2021-02-20 | Disposition: A | Discharge status: Home / Self Care | Payer: BC Managed Care – PPO | Attending: Emergency Medicine | Admitting: Emergency Medicine

## 2021-02-20 ENCOUNTER — Other Ambulatory Visit: Payer: Self-pay

## 2021-02-20 DIAGNOSIS — N939 Abnormal uterine and vaginal bleeding, unspecified: Secondary | ICD-10-CM | POA: Diagnosis not present

## 2021-02-20 DIAGNOSIS — D696 Thrombocytopenia, unspecified: Secondary | ICD-10-CM | POA: Insufficient documentation

## 2021-02-20 DIAGNOSIS — R1030 Lower abdominal pain, unspecified: Secondary | ICD-10-CM | POA: Diagnosis not present

## 2021-02-20 DIAGNOSIS — D649 Anemia, unspecified: Secondary | ICD-10-CM | POA: Diagnosis not present

## 2021-02-20 LAB — CBC WITH DIFFERENTIAL/PLATELET
Abs Immature Granulocytes: 0.01 10*3/uL (ref 0.00–0.07)
Basophils Absolute: 0.1 10*3/uL (ref 0.0–0.1)
Basophils Relative: 1 %
Eosinophils Absolute: 0.1 10*3/uL (ref 0.0–0.5)
Eosinophils Relative: 3 %
HCT: 35.9 % — ABNORMAL LOW (ref 36.0–46.0)
Hemoglobin: 11.5 g/dL — ABNORMAL LOW (ref 12.0–15.0)
Immature Granulocytes: 0 %
Lymphocytes Relative: 29 %
Lymphs Abs: 1 10*3/uL (ref 0.7–4.0)
MCH: 25.4 pg — ABNORMAL LOW (ref 26.0–34.0)
MCHC: 32 g/dL (ref 30.0–36.0)
MCV: 79.2 fL — ABNORMAL LOW (ref 80.0–100.0)
Monocytes Absolute: 0.4 10*3/uL (ref 0.1–1.0)
Monocytes Relative: 13 %
Neutro Abs: 1.9 10*3/uL (ref 1.7–7.7)
Neutrophils Relative %: 54 %
Platelets: 119 10*3/uL — ABNORMAL LOW (ref 150–400)
RBC: 4.53 MIL/uL (ref 3.87–5.11)
RDW: 14.6 % (ref 11.5–15.5)
WBC: 3.5 10*3/uL — ABNORMAL LOW (ref 4.0–10.5)
nRBC: 0 % (ref 0.0–0.2)

## 2021-02-20 LAB — WET PREP, GENITAL
Clue Cells Wet Prep HPF POC: NONE SEEN
Sperm: NONE SEEN
Trich, Wet Prep: NONE SEEN
Yeast Wet Prep HPF POC: NONE SEEN

## 2021-02-20 LAB — I-STAT BETA HCG BLOOD, ED (MC, WL, AP ONLY): I-stat hCG, quantitative: <5 m[IU]/mL (ref <5)

## 2021-02-20 MED ORDER — MEGESTROL ACETATE 20 MG PO TABS
40.0000 mg | ORAL_TABLET | Freq: Two times a day (BID) | ORAL | 0 refills | Status: AC
Start: 2021-02-20 — End: 2021-03-02

## 2021-02-20 NOTE — ED Notes (Signed)
Called lab to confirm results on pending CBC sent at 1100. Per tech results within next 15 minutes.

## 2021-02-20 NOTE — ED Triage Notes (Signed)
Pt arrives POV with complaints of vaginal bleeding X1 month. Using approx 6-8 pads per day. Unable to see GYN til next month.

## 2021-02-20 NOTE — ED Provider Notes (Signed)
[facility] EMERGENCY DEPARTMENT Provider Note   CSN: [id number] Arrival date & time: 02/20/21  1043     History Chief Complaint  Patient presents with  . Vaginal Bleeding    Cassidy Bruce is a 25 y.o. female with a history of ADHD and depression.  Patient presents with chief complaint of vaginal bleeding.  Patient reports vaginal bleeding over the last 45 days.  States that her vaginal bleeding started as spotting however has increased since her last menstrual period.  Patient reports LMP 3/13.  Patient reports that she has been going through 6-8 due to pads daily.  Patient endorses intermittent lower abdominal cramping and nausea.   Patient states she does not have abdominal pain or nausea at present.  Patient denies any history of vaginal bleeding.  Patient denies any history of abnormal Pap smears.  Patient denies any history of abdominal surgeries.  Patient reports that she has Nexplanon birth control implant in place; was placed 08/2020.  Patient is sexually active in a mutually monogamous relationship with a female partner.  Patient does not always use condom protection with sex.    Patient denies any fevers, chills, hematuria, dysuria, urinary urgency, urinary frequency, vaginal pain, vaginal discharge.    HPI     Past Medical History:  Diagnosis Date  . ADHD   . Depression     Patient Active Problem List   Diagnosis Date Noted  . MDD (major depressive disorder), recurrent episode, severe ([facility]) 07/25/2018  . Adjustment disorder with mixed anxiety and depressed mood     No past surgical history on file.   OB History   No obstetric history on file.     Family History  Problem Relation Age of Onset  . Cancer Other   . Diabetes Other   . CAD Other   . Healthy Mother     Social History   Tobacco Use  . Smoking status: Never Smoker  . Smokeless tobacco: Never Used  Vaping Use  . Vaping Use: Never used  Substance Use Topics  . Alcohol use:  No  . Drug use: No    Home Medications Prior to Admission medications   Medication Sig Start Date End Date Taking? Authorizing Provider  levETIRAcetam (KEPPRA) 500 MG tablet Take 1 tablet (500 mg total) by mouth 2 (two) times daily. Patient not taking: Reported on 01/22/2020 08/24/19   Pricilla Loveless, MD  traZODone (DESYREL) 50 MG tablet Take 1 tablet (50 mg total) by mouth at bedtime and may repeat dose one time if needed. Patient not taking: Reported on 09/06/2018 07/26/18   Oneta Rack, NP    Allergies    Patient has no known allergies.  Review of Systems   Review of Systems  Constitutional: Negative for chills and fever.  Gastrointestinal: Positive for abdominal pain and nausea. Negative for abdominal distention, anal bleeding, blood in stool, constipation, diarrhea, rectal pain and vomiting.  Genitourinary: Positive for vaginal bleeding. Negative for decreased urine volume, difficulty urinating, dyspareunia, dysuria, flank pain, frequency, genital sores, hematuria, pelvic pain, urgency, vaginal discharge and vaginal pain.  Neurological: Negative for dizziness, syncope and light-headedness.    Physical Exam Updated Vital Signs BP 126/74   Pulse 66   Temp 97.7 F (36.5 C) (Oral)   Resp 15   SpO2 100%   Physical Exam Vitals and nursing note reviewed. Exam conducted with a chaperone present (female RN present).  Constitutional:      General: She is not in acute  distress.    Appearance: She is not ill-appearing, toxic-appearing or diaphoretic.  HENT:     Head: Normocephalic.  Eyes:     General: No scleral icterus.       Right eye: No discharge.        Left eye: No discharge.  Cardiovascular:     Rate and Rhythm: Normal rate.  Pulmonary:     Effort: Pulmonary effort is normal. No respiratory distress.     Breath sounds: No stridor.  Abdominal:     General: Abdomen is flat. Bowel sounds are normal. There is no distension. There are no signs of injury.      Palpations: Abdomen is soft. There is no mass or pulsatile mass.     Tenderness: There is no abdominal tenderness. There is no guarding or rebound.     Hernia: There is no hernia in the umbilical area or ventral area.  Genitourinary:    Exam position: Lithotomy position.     Pubic Area: No rash or pubic lice.      Tanner stage (genital): 5.     Labia:        Right: No rash, tenderness, lesion or injury.        Left: No rash, tenderness, lesion or injury.      Urethra: No prolapse, urethral pain, urethral swelling or urethral lesion.     Vagina: No signs of injury and foreign body. Bleeding present. No vaginal discharge, erythema, tenderness, lesions or prolapsed vaginal walls.     Cervix: Cervical bleeding present. No cervical motion tenderness, discharge, friability, erythema or eversion.     Uterus: Not enlarged and not tender.      Adnexa:        Right: No mass, tenderness or fullness.         Left: No mass, tenderness or fullness.    Skin:    General: Skin is warm and dry.  Neurological:     General: No focal deficit present.     Mental Status: She is alert.  Psychiatric:        Behavior: Behavior is cooperative.     ED Results / Procedures / Treatments   Labs (all labs ordered are listed, but only abnormal results are displayed) Labs Reviewed  WET PREP, GENITAL - Abnormal; Notable for the following components:      Result Value   WBC, Wet Prep HPF POC MODERATE (*)    All other components within normal limits  CBC WITH DIFFERENTIAL/PLATELET - Abnormal; Notable for the following components:   WBC 3.5 (*)    Hemoglobin 11.5 (*)    HCT 35.9 (*)    MCV 79.2 (*)    MCH 25.4 (*)    Platelets 119 (*)    All other components within normal limits  I-STAT BETA HCG BLOOD, ED ([facility], [facility], [facility] ONLY)  GC/CHLAMYDIA PROBE AMP ([place]) NOT AT [facility]    EKG None  Radiology No results found.  Procedures Procedures   Medications Ordered in ED Medications - No data to  display  ED Course  I have reviewed the triage vital signs and the nursing notes.  Pertinent labs & imaging results that were available during my care of the patient were reviewed by me and considered in my medical decision making (see chart for details).    MDM Rules/Calculators/A&P  Alert 25 year old female no acute distress, nontoxic-appearing.  Patient presents with chief complaint of vaginal bleeding x4 to 5 days.  Patient reports she is going through 6 heavy-duty pads per day.  Patient endorses Nexplanon implant placed in 08/2020.  Patient endorses occasional lower abdominal cramping and occasional nausea.  Patient denies any at present.  On physical exam abdomen is flat, soft, nondistended, nontender.  No guarding, rebound tenderness mass, pulsatile mass, CVA tenderness observed.  Patient noted to have vaginal bleeding and bleeding from cervical os.  No lacerations observed to vaginal vault or cervix.  I-STAT beta-hCG, CBC, wet prep, gonorrhea chlamydia swab obtained.  I-STAT beta-hCG negative low suspicion for intrauterine or ectopic pregnancy. CBC shows slight anemia with hemoglobin 11.5, and hematocrit at 35.9.;  Likely secondary to her prolonged menstrual bleeding.  Platelets slightly lower at 119. Wet prep shows no signs of BV, trichomonas, or yeast infection.  Due to patient's persistent bleeding we will reach out to on-call OB/GYN. 1310 spoke to on-call gynecologist who recommended Megace and close follow-up in outpatient setting with her OB/GYN.  Patient prescribed Megace.  Discussed results, findings, treatment and follow up. Patient advised of return precautions. Patient verbalized understanding and agreed with plan.   Final Clinical Impression(s) / ED Diagnoses Final diagnoses:  Vaginal bleeding    Rx / DC Orders ED Discharge Orders         Ordered    megestrol (MEGACE) 20 MG tablet  2 times daily        02/20/21 1335            Berneice Heinrich 02/20/21 2309    Mancel Bale, MD 02/21/21 [phone]

## 2021-02-20 NOTE — Discharge Instructions (Addendum)
You came to the emergency room today to be evaluated for your vaginal bleeding.  Your pregnancy test was negative, you did not have a large anemia requiring blood transfusion.  I have started you on Megace to help control your bleeding.  You will need to follow-up with your OB/GYN provider within the next 10 days.  Get help right away if you: Pass out. Have bleeding that soaks through a pad every hour. Have pain in the abdomen. Have a fever or chills. Become sweaty or weak. Pass large blood clots from your vagina.

## 2021-02-21 LAB — GC/CHLAMYDIA PROBE AMP (~~LOC~~) NOT AT ARMC
Chlamydia: NEGATIVE
Comment: NEGATIVE
Comment: NORMAL
Neisseria Gonorrhea: NEGATIVE

## 2022-03-28 ENCOUNTER — Ambulatory Visit (HOSPITAL_COMMUNITY)
Admission: EM | Admit: 2022-03-28 | Discharge: 2022-03-28 | Disposition: A | Discharge status: Home / Self Care | Payer: BC Managed Care – PPO | Attending: Internal Medicine | Admitting: Internal Medicine

## 2022-03-28 ENCOUNTER — Encounter (HOSPITAL_COMMUNITY): Payer: Self-pay | Admitting: Emergency Medicine

## 2022-03-28 DIAGNOSIS — Z113 Encounter for screening for infections with a predominantly sexual mode of transmission: Secondary | ICD-10-CM | POA: Insufficient documentation

## 2022-03-28 DIAGNOSIS — B3731 Acute candidiasis of vulva and vagina: Secondary | ICD-10-CM | POA: Insufficient documentation

## 2022-03-28 DIAGNOSIS — R300 Dysuria: Secondary | ICD-10-CM | POA: Insufficient documentation

## 2022-03-28 DIAGNOSIS — N760 Acute vaginitis: Secondary | ICD-10-CM | POA: Diagnosis not present

## 2022-03-28 DIAGNOSIS — N898 Other specified noninflammatory disorders of vagina: Secondary | ICD-10-CM

## 2022-03-28 LAB — POCT URINALYSIS DIPSTICK, ED / UC
Bilirubin Urine: NEGATIVE
Glucose, UA: NEGATIVE mg/dL
Hgb urine dipstick: NEGATIVE
Ketones, ur: 40 mg/dL — AB
Leukocytes,Ua: NEGATIVE
Nitrite: NEGATIVE
Protein, ur: NEGATIVE mg/dL
Specific Gravity, Urine: 1.02 (ref 1.005–1.030)
Urobilinogen, UA: 1 mg/dL (ref 0.0–1.0)
pH: 7 (ref 5.0–8.0)

## 2022-03-28 MED ORDER — OLOPATADINE HCL 0.1 % OP SOLN: 1.0000 [drp] | Freq: Two times a day (BID) | OPHTHALMIC | 12 refills | Status: DC

## 2022-03-28 MED ORDER — FLUCONAZOLE 150 MG PO TABS: 150.0000 mg | ORAL_TABLET | Freq: Every day | ORAL | 1 refills | Status: DC

## 2022-03-28 NOTE — ED Provider Notes (Addendum)
[facility]    CSN: [id number] Arrival date & time: 03/28/22  1715      History   Chief Complaint Chief Complaint  Patient presents with   Vaginal Discharge    HPI Cassidy Bruce is a 26 y.o. female.   Patient is a 26 year old female presenting today with vaginal discharge that is thick, white, and has an odor. Small amount of vaginal itching reported. She also states that she experiences vaginal pain after penetrative intercourse with her female partner.   Denies new sexual partners recently.  Last menstrual period was February 27, 2022 and she takes birth control consistently.  Denies chance of pregnancy.  No recent exposure to STIs that she is aware of.  Also reporting mild right lower quadrant abdominal pain that comes and goes.  She has noticed abdominal pain to the right lower quadrant 2 times in the last week and it has resolved on its own without medication.  She also reports 1 episode of burning with urination this week while she was at work.  She states that she attempts to void before and after intercourse consistently to prevent urinary tract infections.  Denies taking medications at home for symptoms.  Also reporting small amount of eye itching.  Denies nasal congestion/drainage and sore throat/cough.  No history of seasonal allergies in the past.  No other aggravating or relieving factors identified.   Vaginal Discharge  Past Medical History:  Diagnosis Date   ADHD    Depression     Patient Active Problem List   Diagnosis Date Noted   MDD (major depressive disorder), recurrent episode, severe (HCC) 07/25/2018   Adjustment disorder with mixed anxiety and depressed mood     History reviewed. No pertinent surgical history.  OB History   No obstetric history on file.      Home Medications    Prior to Admission medications   Medication Sig Start Date End Date Taking? Authorizing Provider  fluconazole (DIFLUCAN) 150 MG tablet Take 1 tablet (150 mg total)  by mouth daily. 03/28/22  Yes Carlisle Beers, FNP  olopatadine (PATANOL) 0.1 % ophthalmic solution Place 1 drop into both eyes 2 (two) times daily. 03/28/22  Yes Carlisle Beers, FNP  acetaminophen (TYLENOL) 500 MG tablet Take 1,000 mg by mouth every 6 (six) hours as needed for mild pain.    [provider]  Multiple Vitamin (MULTIVITAMIN PO) Take 1 tablet by mouth daily.    [provider]  Multiple Vitamins-Minerals (HAIR SKIN AND NAILS FORMULA PO) Take 1 tablet by mouth daily.    [provider]    Family History Family History  Problem Relation Age of Onset   Cancer Other    Diabetes Other    CAD Other    Healthy Mother     Social History Social History   Tobacco Use   Smoking status: Never   Smokeless tobacco: Never  Vaping Use   Vaping Use: Never used  Substance Use Topics   Alcohol use: No   Drug use: No     Allergies   Patient has no known allergies.   Review of Systems Review of Systems  Genitourinary:  Positive for vaginal discharge.  Per HPI  Physical Exam Triage Vital Signs ED Triage Vitals  Enc Vitals Group     BP 03/28/22 1755 133/84     Pulse Rate 03/28/22 1755 68     Resp 03/28/22 1755 15     Temp 03/28/22 1755 98 F (  36.7 C)     Temp Source 03/28/22 1755 Oral     SpO2 03/28/22 1755 97 %     Weight --      Height --      Head Circumference --      Peak Flow --      Pain Score 03/28/22 1753 0     Pain Loc --      Pain Edu? --      Excl. in [place]? --    No data found.  Updated Vital Signs BP 133/84   Pulse 68   Temp 98 F (36.7 C) (Oral)   Resp 15   LMP 02/27/2022   SpO2 97%   Visual Acuity Right Eye Distance:   Left Eye Distance:   Bilateral Distance:    Right Eye Near:   Left Eye Near:    Bilateral Near:     Physical Exam Vitals and nursing note reviewed.  Constitutional:      General: She is not in acute distress.    Appearance: Normal appearance. She is well-developed. She is not  ill-appearing.  HENT:     Head: Normocephalic and atraumatic.     Right Ear: Tympanic membrane, ear canal and external ear normal.     Left Ear: Tympanic membrane, ear canal and external ear normal.     Nose: Nose normal.     Mouth/Throat:     Mouth: Mucous membranes are moist.  Eyes:     Extraocular Movements: Extraocular movements intact.     Conjunctiva/sclera: Conjunctivae normal.  Cardiovascular:     Rate and Rhythm: Normal rate and regular rhythm.     Heart sounds: Normal heart sounds. No murmur heard.   No friction rub. No gallop.  Pulmonary:     Effort: Pulmonary effort is normal. No respiratory distress.     Breath sounds: Normal breath sounds. No wheezing, rhonchi or rales.  Chest:     Chest wall: No tenderness.  Abdominal:     General: Abdomen is flat.     Palpations: Abdomen is soft.     Tenderness: There is abdominal tenderness in the right lower quadrant, suprapubic area and left lower quadrant. There is no right CVA tenderness, left CVA tenderness or guarding. Negative signs include Murphy's sign and McBurney's sign.  Musculoskeletal:        General: No swelling.     Cervical back: Neck supple.  Skin:    General: Skin is warm and dry.     Capillary Refill: Capillary refill takes less than 2 seconds.     Findings: No rash.  Neurological:     General: No focal deficit present.     Mental Status: She is alert and oriented to person, place, and time. Mental status is at baseline.     Motor: No weakness.     Gait: Gait normal.  Psychiatric:        Mood and Affect: Mood normal.        Behavior: Behavior normal. Behavior is cooperative.        Thought Content: Thought content normal.        Judgment: Judgment normal.     UC Treatments / Results  Labs (all labs ordered are listed, but only abnormal results are displayed) Labs Reviewed  POCT URINALYSIS DIPSTICK, ED / UC - Abnormal; Notable for the following components:      Result Value   Ketones, ur 40 (*)     All other components within normal  limits  CERVICOVAGINAL ANCILLARY ONLY    EKG   Radiology No results found.  Procedures Procedures (including critical care time)  Medications Ordered in UC Medications - No data to display  Initial Impression / Assessment and Plan / UC Course  I have reviewed the triage vital signs and the nursing notes.  Pertinent labs & imaging results that were available during my care of the patient were reviewed by me and considered in my medical decision making (see chart for details).   Patient is a 26 year old female presenting to urgent care for evaluation of white and thick vaginal discharge with odor. Also reports right and left lower abdominal pain with one episode of dysuria. STI testing pending. Plan to treat for suspected vaginal candidiasis today with diflucan prescription. She may take the first 150mg  pill today, then pick up the refill at the pharmacy in 7 days if she continues to have symptoms. Urinalysis today shows ketonuria likely related to dehydration. No urinary tract infection present. Encouraged patient to increased water intake and eat a well-balanced diet.   Prescribed olopatadine eyedrops for eye itching to be used twice daily as needed for allergic conjunctivitis. No other allergy symptoms or indication for daily oral antihistamine or Flonase nasal spray at this time.   Counseled patient regarding appropriate use of medications and potential side effects for all medications recommended or prescribed today. Discussed red flag signs and symptoms of worsening condition,when to call the PCP office, return to urgent care, and when to seek higher level of care. Patient verbalizes understanding and agreement with plan. All questions answered. Patient discharged in stable condition.  Final Clinical Impressions(s) / UC Diagnoses   Final diagnoses:  Encounter for screening examination for sexually transmitted disease  Vaginal yeast infection   Dysuria     Discharge Instructions      You were seen in urgent care today for vaginal discharge and STI testing.  STI testing is pending at this time.   Take one diflucan tablet today, then one more in 7 days if you continue to have symptoms.  Your urinalysis shows that you are dehydrated. No urinary tract infection today. Drink at least 64 ounces of water per day.   Place 1 drop of olopatadine eyedrops into each eye 2 times daily as needed for eye itch.  If you develop any new or worsening symptoms or do not improve in the next 2 to 3 days, please return.  If your symptoms are severe, please go to the emergency room.  Follow-up with your primary care provider for further evaluation and management of your symptoms as well as ongoing wellness visits.  I hope you feel better!     ED Prescriptions     Medication Sig Dispense Auth. Provider   olopatadine (PATANOL) 0.1 % ophthalmic solution Place 1 drop into both eyes 2 (two) times daily. 5 mL M, FNP   fluconazole (DIFLUCAN) 150 MG tablet Take 1 tablet (150 mg total) by mouth daily. 1 tablet M, FNP      PDMP not reviewed this encounter.   Carlisle Beers, FNP 03/28/22 1921    03/30/22, FNP 03/28/22 1927    03/30/22, FNP 03/28/22 1928

## 2022-03-28 NOTE — ED Triage Notes (Signed)
Pt c/o white thick vaginal discharge for a month as well vaginal irritation. Reports pain after sexual intercourse. Denies any urinary problems.

## 2022-03-28 NOTE — Discharge Instructions (Addendum)
You were seen in urgent care today for vaginal discharge and STI testing.  STI testing is pending at this time.   Take one diflucan tablet today, then one more in 7 days if you continue to have symptoms.  Your urinalysis shows that you are dehydrated. No urinary tract infection today. Drink at least 64 ounces of water per day.   Place 1 drop of olopatadine eyedrops into each eye 2 times daily as needed for eye itch.  If you develop any new or worsening symptoms or do not improve in the next 2 to 3 days, please return.  If your symptoms are severe, please go to the emergency room.  Follow-up with your primary care provider for further evaluation and management of your symptoms as well as ongoing wellness visits.  I hope you feel better!

## 2022-04-01 LAB — CERVICOVAGINAL ANCILLARY ONLY
Bacterial Vaginitis (gardnerella): POSITIVE — AB
Candida Glabrata: NEGATIVE
Candida Vaginitis: NEGATIVE
Chlamydia: NEGATIVE
Comment: NEGATIVE
Comment: NEGATIVE
Comment: NEGATIVE
Comment: NEGATIVE
Comment: NEGATIVE
Comment: NORMAL
Neisseria Gonorrhea: NEGATIVE
Trichomonas: NEGATIVE

## 2022-04-02 ENCOUNTER — Telehealth (HOSPITAL_COMMUNITY): Payer: Self-pay | Admitting: Emergency Medicine

## 2022-04-02 MED ORDER — METRONIDAZOLE 500 MG PO TABS: 500.0000 mg | ORAL_TABLET | Freq: Two times a day (BID) | ORAL | 0 refills | Status: DC

## 2022-05-21 DIAGNOSIS — Z01419 Encounter for gynecological examination (general) (routine) without abnormal findings: Secondary | ICD-10-CM | POA: Diagnosis not present

## 2022-05-21 DIAGNOSIS — Z113 Encounter for screening for infections with a predominantly sexual mode of transmission: Secondary | ICD-10-CM | POA: Diagnosis not present

## 2022-08-28 ENCOUNTER — Encounter (HOSPITAL_COMMUNITY): Payer: Self-pay

## 2022-08-28 ENCOUNTER — Ambulatory Visit (HOSPITAL_COMMUNITY)
Admission: EM | Admit: 2022-08-28 | Discharge: 2022-08-28 | Disposition: A | Discharge status: Home / Self Care | Payer: BC Managed Care – PPO | Attending: Physician Assistant | Admitting: Physician Assistant

## 2022-08-28 DIAGNOSIS — N898 Other specified noninflammatory disorders of vagina: Secondary | ICD-10-CM

## 2022-08-28 DIAGNOSIS — Z113 Encounter for screening for infections with a predominantly sexual mode of transmission: Secondary | ICD-10-CM

## 2022-08-28 LAB — HIV ANTIBODY (ROUTINE TESTING W REFLEX): HIV Screen 4th Generation wRfx: NONREACTIVE

## 2022-08-28 LAB — POC URINE PREG, ED: Preg Test, Ur: NEGATIVE

## 2022-08-28 MED ORDER — METRONIDAZOLE 500 MG PO TABS: 500.0000 mg | ORAL_TABLET | Freq: Two times a day (BID) | ORAL | 0 refills | Status: DC

## 2022-08-28 NOTE — ED Provider Notes (Signed)
[facility]    CSN: [id number] Arrival date & time: 08/28/22  1120      History   Chief Complaint Chief Complaint  Patient presents with   Vaginal Discharge    HPI Cassidy Bruce is a 26 y.o. female.   Patient presents today with a prolonged history of malodorous vaginal discharge.  Reports she was seen in May 2023 at which point she was empirically treated with Diflucan but swab returned with bacterial vaginosis.  Unfortunately, the medication was sent to the wrong pharmacy and she never picked it up.  She has taken Diflucan twice without improvement of symptoms.  She denies any changes to personal hygiene products including soaps or detergents.  Denies any recent antibiotic use.  She does not believe that she is pregnant but has had irregular periods since having Nexplanon.  She has no specific concern for STI but is open to testing.  She denies any pelvic pain, abdominal pain, fever, nausea, vomiting.    Past Medical History:  Diagnosis Date   ADHD    Depression     Patient Active Problem List   Diagnosis Date Noted   MDD (major depressive disorder), recurrent episode, severe (HCC) 07/25/2018   Adjustment disorder with mixed anxiety and depressed mood     History reviewed. No pertinent surgical history.  OB History   No obstetric history on file.      Home Medications    Prior to Admission medications   Medication Sig Start Date End Date Taking? Authorizing Provider  metroNIDAZOLE (FLAGYL) 500 MG tablet Take 1 tablet (500 mg total) by mouth 2 (two) times daily. 08/28/22  Yes Brynlyn Dade, Noberto Retort, PA-C  acetaminophen (TYLENOL) 500 MG tablet Take 1,000 mg by mouth every 6 (six) hours as needed for mild pain.    [provider]  Multiple Vitamin (MULTIVITAMIN PO) Take 1 tablet by mouth daily.    [provider]  Multiple Vitamins-Minerals (HAIR SKIN AND NAILS FORMULA PO) Take 1 tablet by mouth daily.    [provider]  olopatadine  (PATANOL) 0.1 % ophthalmic solution Place 1 drop into both eyes 2 (two) times daily. 03/28/22   Carlisle Beers, FNP    Family History Family History  Problem Relation Age of Onset   Cancer Other    Diabetes Other    CAD Other    Healthy Mother     Social History Social History   Tobacco Use   Smoking status: Never   Smokeless tobacco: Never  Vaping Use   Vaping Use: Never used  Substance Use Topics   Alcohol use: No   Drug use: No     Allergies   Patient has no known allergies.   Review of Systems Review of Systems  Constitutional:  Negative for activity change, appetite change, fatigue and fever.  Gastrointestinal:  Negative for abdominal pain, diarrhea, nausea and vomiting.  Genitourinary:  Positive for vaginal discharge. Negative for dysuria, flank pain, frequency, pelvic pain, urgency, vaginal bleeding and vaginal pain.     Physical Exam Triage Vital Signs ED Triage Vitals [08/28/22 1307]  Enc Vitals Group     BP (!) 144/89     Pulse Rate 70     Resp 18     Temp 98.7 F (37.1 C)     Temp Source Oral     SpO2 100 %     Weight      Height      Head Circumference  Peak Flow      Pain Score 0     Pain Loc      Pain Edu?      Excl. in GC?    No data found.  Updated Vital Signs BP (!) 144/89 (BP Location: Left Arm)   Pulse 70   Temp 98.7 F (37.1 C) (Oral)   Resp 18   LMP 07/13/2022   SpO2 100%   Visual Acuity Right Eye Distance:   Left Eye Distance:   Bilateral Distance:    Right Eye Near:   Left Eye Near:    Bilateral Near:     Physical Exam Vitals reviewed.  Constitutional:      General: She is awake. She is not in acute distress.    Appearance: Normal appearance. She is well-developed. She is not ill-appearing.     Comments: Very pleasant female appears stated age in no acute distress sitting comfortably in exam room  HENT:     Head: Normocephalic and atraumatic.  Cardiovascular:     Rate and Rhythm: Normal rate and  regular rhythm.     Heart sounds: Normal heart sounds, S1 normal and S2 normal. No murmur heard. Pulmonary:     Effort: Pulmonary effort is normal.     Breath sounds: Normal breath sounds. No wheezing, rhonchi or rales.     Comments: Clear to auscultation bilaterally Abdominal:     General: Bowel sounds are normal.     Palpations: Abdomen is soft.     Tenderness: There is no abdominal tenderness. There is no right CVA tenderness, left CVA tenderness, guarding or rebound.     Comments: Benign abdominal exam  Genitourinary:    Comments: Exam deferred Psychiatric:        Behavior: Behavior is cooperative.      UC Treatments / Results  Labs (all labs ordered are listed, but only abnormal results are displayed) Labs Reviewed  HIV ANTIBODY (ROUTINE TESTING W REFLEX)  RPR  POC URINE PREG, ED  CERVICOVAGINAL ANCILLARY ONLY    EKG   Radiology No results found.  Procedures Procedures (including critical care time)  Medications Ordered in UC Medications - No data to display  Initial Impression / Assessment and Plan / UC Course  I have reviewed the triage vital signs and the nursing notes.  Pertinent labs & imaging results that were available during my care of the patient were reviewed by me and considered in my medical decision making (see chart for details).     Patient is well-appearing, afebrile, nontoxic, nontachycardic.  Urine pregnancy test was negative.  Concern for bacterial vaginosis given previous positive result that was never adequately treated and clinical presentation.  Repeat STI swab was collected today-results pending.  Will empirically treat for BV.  Metronidazole sent to pharmacy and she was instructed not to drink any alcohol with this medication due to associated Antabuse side effects.  Recommended hypoallergenic soaps and detergents and she is to wear loosefitting cotton underwear.  HIV and syphilis testing were obtained today as part of screening-results  pending.  Discussed that if she has any additional symptoms she needs to be seen immediately.  Strict return precautions given.  Final Clinical Impressions(s) / UC Diagnoses   Final diagnoses:  Vaginal discharge  Vaginal odor  Routine screening for STI (sexually transmitted infection)     Discharge Instructions      We are treating you for bacterial vaginosis.  Please take metronidazole twice daily for 7 days.  Do not  drink any alcohol with this medication for 3 days after completing course as it will cause you to vomit.  We will contact you if we need to arrange additional treatment based on your testing.  Wear loosefitting cotton underwear and use hypoallergenic soaps and detergents.  If your symptoms are not improving please follow-up with OB/GYN; call to schedule an appointment.  If anything worsens please return for reevaluation.     ED Prescriptions     Medication Sig Dispense Auth. Provider   metroNIDAZOLE (FLAGYL) 500 MG tablet Take 1 tablet (500 mg total) by mouth 2 (two) times daily. 14 tablet Shellie Goettl, Derry Skill, PA-C      PDMP not reviewed this encounter.   Terrilee Croak, PA-C 08/28/22 1336

## 2022-08-28 NOTE — ED Triage Notes (Addendum)
Pt c/o white vaginal discharge with odor x1 month. States took diflucan for yeast infection 3wks ago with no relief. Talking with pt, pt dx with BV in May and didn't know she had antibiotics at the pharmacy for that.

## 2022-08-28 NOTE — Discharge Instructions (Signed)
We are treating you for bacterial vaginosis.  Please take metronidazole twice daily for 7 days.  Do not drink any alcohol with this medication for 3 days after completing course as it will cause you to vomit.  We will contact you if we need to arrange additional treatment based on your testing.  Wear loosefitting cotton underwear and use hypoallergenic soaps and detergents.  If your symptoms are not improving please follow-up with OB/GYN; call to schedule an appointment.  If anything worsens please return for reevaluation.

## 2022-08-29 LAB — CERVICOVAGINAL ANCILLARY ONLY
Bacterial Vaginitis (gardnerella): POSITIVE — AB
Candida Glabrata: NEGATIVE
Candida Vaginitis: NEGATIVE
Chlamydia: NEGATIVE
Comment: NEGATIVE
Comment: NEGATIVE
Comment: NEGATIVE
Comment: NEGATIVE
Comment: NEGATIVE
Comment: NORMAL
Neisseria Gonorrhea: NEGATIVE
Trichomonas: NEGATIVE

## 2022-08-29 LAB — RPR: RPR Ser Ql: NONREACTIVE

## 2022-11-21 ENCOUNTER — Encounter (HOSPITAL_COMMUNITY): Payer: Self-pay

## 2022-11-21 ENCOUNTER — Ambulatory Visit (HOSPITAL_COMMUNITY)
Admission: EM | Admit: 2022-11-21 | Discharge: 2022-11-21 | Disposition: A | Discharge status: Home / Self Care | Payer: Self-pay | Attending: Family Medicine | Admitting: Family Medicine

## 2022-11-21 DIAGNOSIS — N760 Acute vaginitis: Secondary | ICD-10-CM

## 2022-11-21 MED ORDER — METRONIDAZOLE 500 MG PO TABS: 500.0000 mg | ORAL_TABLET | Freq: Two times a day (BID) | ORAL | 0 refills | Status: AC

## 2022-11-21 NOTE — ED Triage Notes (Signed)
C/o vaginal discharge x 1 month. Pt reports itching and burning.

## 2022-11-21 NOTE — ED Provider Notes (Signed)
[place]    CSN: [id number] Arrival date & time: 11/21/22  1148      History   Chief Complaint Chief Complaint  Patient presents with   Vaginal Discharge    HPI Cassidy Bruce is a 27 y.o. female.    Vaginal Discharge  Here for vaginal itching and discharge.  There is some odor.  No abdominal pain or fever.  She does feel the symptoms are consistent with when she has had bacterial vaginosis previously.  She has a Nexplanon  She asks what could prevent her from having BV in the future  Past Medical History:  Diagnosis Date   ADHD    Depression     Patient Active Problem List   Diagnosis Date Noted   MDD (major depressive disorder), recurrent episode, severe ([place]) 07/25/2018   Adjustment disorder with mixed anxiety and depressed mood     History reviewed. No pertinent surgical history.  OB History   No obstetric history on file.      Home Medications    Prior to Admission medications   Medication Sig Start Date End Date Taking? Authorizing Provider  metroNIDAZOLE (FLAGYL) 500 MG tablet Take 1 tablet (500 mg total) by mouth 2 (two) times daily for 7 days. 11/21/22 11/28/22 Yes Barrett Henle, MD  Multiple Vitamin (MULTIVITAMIN PO) Take 1 tablet by mouth daily.   Yes [provider]  Multiple Vitamins-Minerals (HAIR SKIN AND NAILS FORMULA PO) Take 1 tablet by mouth daily.   Yes [provider]  olopatadine (PATANOL) 0.1 % ophthalmic solution Place 1 drop into both eyes 2 (two) times daily. 03/28/22  Yes Talbot Grumbling, FNP  acetaminophen (TYLENOL) 500 MG tablet Take 1,000 mg by mouth every 6 (six) hours as needed for mild pain.    [provider]    Family History Family History  Problem Relation Age of Onset   Cancer Other    Diabetes Other    CAD Other    Healthy Mother     Social History Social History   Tobacco Use   Smoking status: Never   Smokeless tobacco: Never  Vaping Use   Vaping Use:  Never used  Substance Use Topics   Alcohol use: No   Drug use: No     Allergies   Patient has no known allergies.   Review of Systems Review of Systems  Genitourinary:  Positive for vaginal discharge.     Physical Exam Triage Vital Signs ED Triage Vitals [11/21/22 1335]  Enc Vitals Group     BP 129/87     Pulse Rate 78     Resp 18     Temp 98.7 F (37.1 C)     Temp Source Oral     SpO2 100 %     Weight      Height      Head Circumference      Peak Flow      Pain Score      Pain Loc      Pain Edu?      Excl. in [place]?    No data found.  Updated Vital Signs BP 129/87 (BP Location: Left Arm)   Pulse 78   Temp 98.7 F (37.1 C) (Oral)   Resp 18   SpO2 100%   Visual Acuity Right Eye Distance:   Left Eye Distance:   Bilateral Distance:    Right Eye Near:   Left Eye Near:    Bilateral Near:  Physical Exam Vitals reviewed.  Constitutional:      General: She is not in acute distress.    Appearance: She is not ill-appearing, toxic-appearing or diaphoretic.  HENT:     Mouth/Throat:     Mouth: Mucous membranes are moist.  Cardiovascular:     Rate and Rhythm: Normal rate and regular rhythm.  Skin:    Coloration: Skin is not pale.  Neurological:     Mental Status: She is alert and oriented to person, place, and time.  Psychiatric:        Behavior: Behavior normal.      UC Treatments / Results  Labs (all labs ordered are listed, but only abnormal results are displayed) Labs Reviewed  CERVICOVAGINAL ANCILLARY ONLY    EKG   Radiology No results found.  Procedures Procedures (including critical care time)  Medications Ordered in UC Medications - No data to display  Initial Impression / Assessment and Plan / UC Course  I have reviewed the triage vital signs and the nursing notes.  Pertinent labs & imaging results that were available during my care of the patient were reviewed by me and considered in my medical decision making (see chart  for details).       Staff will notify her of any positives on the swab.  Metronidazole orally is sent in empirically.  She can try boric acid vaginal suppositories to try to prevent the BV.  HIV and RPR screening were done in October.  Final Clinical Impressions(s) / UC Diagnoses   Final diagnoses:  Acute vaginitis   Discharge Instructions   None    ED Prescriptions     Medication Sig Dispense Auth. Provider   metroNIDAZOLE (FLAGYL) 500 MG tablet Take 1 tablet (500 mg total) by mouth 2 (two) times daily for 7 days. 14 tablet Radwan Cowley, Gwenlyn Perking, MD      PDMP not reviewed this encounter.   Barrett Henle, MD 11/21/22 1409

## 2022-11-21 NOTE — Discharge Instructions (Signed)
Take metronidazole 500 mg--1 tablet 2 times daily for 7 days.  Avoid drinking alcohol within 72 hours of taking this medication  Staff will notify you of any positives on the swab   You can try boric acid vaginal suppositories that she can buy over-the-counter to try to prevent the bacterial vaginosis.

## 2022-11-24 LAB — CERVICOVAGINAL ANCILLARY ONLY
Bacterial Vaginitis (gardnerella): POSITIVE — AB
Candida Glabrata: NEGATIVE
Candida Vaginitis: NEGATIVE
Chlamydia: POSITIVE — AB
Comment: NEGATIVE
Comment: NEGATIVE
Comment: NEGATIVE
Comment: NEGATIVE
Comment: NEGATIVE
Comment: NORMAL
Neisseria Gonorrhea: NEGATIVE
Trichomonas: NEGATIVE

## 2022-11-25 ENCOUNTER — Telehealth (HOSPITAL_COMMUNITY): Payer: Self-pay | Admitting: Emergency Medicine

## 2022-11-25 MED ORDER — DOXYCYCLINE HYCLATE 100 MG PO CAPS: 100.0000 mg | ORAL_CAPSULE | Freq: Two times a day (BID) | ORAL | 0 refills | Status: AC

## 2022-12-05 ENCOUNTER — Ambulatory Visit (HOSPITAL_COMMUNITY)
Admission: EM | Admit: 2022-12-05 | Discharge: 2022-12-05 | Disposition: A | Discharge status: Home / Self Care | Payer: No Payment, Other

## 2022-12-05 DIAGNOSIS — Z046 Encounter for general psychiatric examination, requested by authority: Secondary | ICD-10-CM

## 2022-12-05 NOTE — ED Provider Notes (Signed)
Behavioral Health Urgent Care Medical Screening Exam  Patient Name: Cassidy Bruce MRN: [id number] Date of Evaluation: 12/05/22 Chief Complaint:  Involuntary commitment, suspected suicidal behavior Diagnosis:  Final diagnoses:  Involuntary commitment    History of Present illness: Cassidy Bruce is a 27 y.o. female.  She has a past psychiatric history of an inpatient admission to [facility] in 2019.  She was put under involuntary commitment by [facility] Department due to concern that she was suicidal on top of a parking garage.  She was quickly discharged after 1 day.  She presented to the [facility] behavioral urgent care on 2/2 after being again put under involuntary commitment by the [employer]  The IVC document states that the patient has had suicidal thoughts and previous suicide attempt.  It states that she was "pulled off the railing of a bridge".  It states that it seemed like she would jump.  It states that she tried to elope after being in custody and attempted to go back to the bridge.  On interview with the patient, she exhibits a euthymic affect and a linear and logical thought process.  She denies much of the involuntary commitment paperwork.  She states that she was on the phone with a friend and became upset when they had a disagreement.  She states that she began crying and went to go sit on the railing of the bridge.  She states that she was facing towards the road the entire time and does not feel like she gave anyone an indication that she was going to jump off the bridge.  She denies experiencing suicidal thoughts.   The patient states that she lives with her aunt at her house in [place] for the past 2 years.  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  She reports that both of these are going well.  She denies experiencing any depression recently.  She reports appropriate sleep and  appetite and denies anhedonia.  She denies any substance use issues.  She denies significant anxiety or volatility of affect or issues with anger.  She is future oriented stating she wants to get a car and be more independent.  She states that she has effective coping strategies, including going to the gym on a regular basis.  Called the patient's aunt, Diamond Nickel, [phone].  She confirms the patient's social history and states that she does not feel the patient has any issues with depression.  She reports that she is active during the day and never has made suicidal statements.  She reports that she is not concerned about the patient harming herself in the future.  She confirms that there are no guns in the house.  She states that she will keep a close eye on the patient over the next 24 to 48 hours.  Discussed with her that the patient can always come to the behavioral urgent care should she have an issue.  The aunt reported that she was on her way to pick up the patient.  The patient was discharged with resources to follow-up for outpatient therapy in the upstairs clinic.  Involuntary commitment was rescinded.  [place] ED from 12/05/2022 in [facility] ED from 11/21/2022 in [facility]gent [facility] at [facility] ED from 08/28/2022 in [facility] Urgent Care at [place] High Risk No Risk No Risk      Total  Time spent with patient: 20 minutes  Musculoskeletal  Strength & Muscle Tone: within normal limits Gait & Station: normal Patient leans: N/A  Psychiatric Specialty Exam  Presentation General Appearance: Appropriate for Environment  Eye Contact:Fair  Speech:Clear and Coherent  Speech Volume:Normal  Handedness:-- (not assessed)   Mood and Affect  Mood:Euthymic  Affect:Congruent   Thought Process  Thought Processes:Coherent; Linear  Descriptions of Associations:Intact  Orientation:Full (Time, Place and  Person)  Thought Content:Logical    Hallucinations:Hallucinations: None  Ideas of Reference:None  Suicidal Thoughts:Suicidal Thoughts: No  Homicidal Thoughts:Homicidal Thoughts: No   Sensorium  Memory:Immediate Fair; Recent Fair; Remote Fair  Judgment:Fair  Insight:Fair   Executive Functions  Concentration:Fair  Attention Span:Fair  [place]   Psychomotor Activity  Psychomotor Activity:Psychomotor Activity: Normal   Assets  Assets:Communication Skills; Resilience   Sleep  Sleep:Sleep: Fair   Nutritional Assessment (For OBS and FBC admissions only) Has the patient had a weight loss or gain of 10 pounds or more in the last 3 months?: No Has the patient had a decrease in food intake/or appetite?: Yes Does the patient have dental problems?: No Does the patient have eating habits or behaviors that may be indicators of an eating disorder including binging or inducing vomiting?: No Has the patient recently lost weight without trying?: 0 Has the patient been eating poorly because of a decreased appetite?: 0 Malnutrition Screening Tool Score: 0    Physical Exam Constitutional:      Appearance: the patient is not toxic-appearing.  Pulmonary:     Effort: Pulmonary effort is normal.  Neurological:     General: No focal deficit present.     Mental Status: the patient is alert and oriented to person, place, and time.   Review of Systems  Respiratory:  Negative for shortness of breath.   Cardiovascular:  Negative for chest pain.  Gastrointestinal:  Negative for abdominal pain, constipation, diarrhea, nausea and vomiting.  Neurological:  Negative for headaches.    BP 136/82 (BP Location: Left Arm)   Pulse 91   Temp 98.7 F (37.1 C) (Oral)   Resp 18   SpO2 100%    Past Psychiatric History: as above   Is the patient at risk to self? No  Has the patient been a risk to self in the past 6 months? No .    Has the  patient been a risk to self within the distant past? No   Is the patient a risk to others? No   Has the patient been a risk to others in the past 6 months? No   Has the patient been a risk to others within the distant past? No   Past Medical History: as above Family History: none Social History: as above    [facility] MSE Discharge Disposition for Follow up and Recommendations: Based on my evaluation the patient does not appear to have an emergency medical condition and can be discharged with resources and follow up care in outpatient services for Individual Therapy   Corky Sox, MD 12/05/2022, 5:29 PM

## 2022-12-05 NOTE — BH Assessment (Signed)
Comprehensive Clinical Assessment (CCA) Screening, Triage and Referral Note  12/05/2022 Cassidy Bruce [id number]  Disposition: Per Dr. Corky Sox, patient is psychiatrically cleared.   The patient demonstrates the following risk factors for suicide: Chronic risk factors for suicide include: psychiatric disorder of depression and previous suicide attempts x1 . Acute risk factors for suicide include: family or marital conflict. Protective factors for this patient include: positive social support and responsibility to others (children, family). Considering these factors, the overall suicide risk at this point appears to be low. Patient is not appropriate for outpatient follow up.  Chief Complaint:  Chief Complaint  Patient presents with   IVC   Visit Diagnosis: IVC  Patient Reported Information How did you hear about [place]? Other (Comment) (IVC)  What Is the Reason for Your Visit/Call Today? PT WAS FOUND SITTING ON A BRIDGE BY BHRT. PT WAS PLACED UNDER IVC AND PER IVC "[name] HAS SUICIDAL IDEATIONS AND ATTEMPTED SUICIDE IN THE PAST. TODAY [name] WAS PULLED OFF THE BRIDGE ON [address] RIGHT BEFORE SHE WAS GOING TO JUMP. AFTER BEING PHYSICALLY PULLED FROM BRIDGE AND PUT INSIDE THE VEHICLE, [name] TRIED TO GET OUT OF THE VEHICLE TO [place] BACK TO THE BRIDGE". PT DENIES SI, HI, AVH AND SUBSTANCE USE.PATIENT REPORTS SHE GOT INTO AN ARGUMENT WITH HER FEMALE FRIEND TODAY AND AFTER THE ARGUMENT/DISAGREEMENT SHE WAS TEARFUL AND UPSET ABOUT THE CONVERSATION. PATIENT DOES NOT WANT TO GIVE DETAIL ABOUT THE CONVERSATION. PATIENT REPORTS SHE DECIDED TO GET READY FOR WORK AND STATED TO WALK TO THE BUS STOP. PATIENT REPORTS SHE STOPPED AND SAT ON THE BRIDGE TO CLEAR HER MIND BEFORE GOING TO WORK AND THEN THE BHRT CAME TO TALK WITH HER. PATIENT DENIES MAKING ANY STATEMENTS ABOUT BEING SUICIDAL OR WANTING TO HARM HERSELF. PATIENT REPROTS SHE WAS NOT PLANNING ON JUMPING AND UNDERSTAND THAT SHE  SHOULD NOT HAVE SAT ON THE BRIDGE. PAITENT DENIES HI, AVH AND SUBSTANCE USE. PATIENT DOES NOT HAVE OUTPATIENT SERVICES AND DENIES HISTORY OF INPATIENT TREATMENT.  How Long Has This Been Causing You Problems? 1 wk - 1 month  What Do You Feel Would Help You the Most Today? Treatment for Depression or other mood problem   Have You Recently Had Any Thoughts About Hurting Yourself? No  Are You Planning to Commit Suicide/Harm Yourself At This time? No   Have you Recently Had Thoughts About [place]? No  Are You Planning to Harm Someone at This Time? No  Explanation: No data recorded  Have You Used Any Alcohol or Drugs in the Past 24 Hours? No  How Long Ago Did You Use Drugs or Alcohol? No data recorded What Did You Use and How Much? No data recorded  Do You Currently Have a Therapist/Psychiatrist? No  Name of Therapist/Psychiatrist: NA   Have You Been Recently Discharged From Any Office Practice or Programs? No  Explanation of Discharge From Practice/Program: NA    CCA Screening Triage Referral Assessment Type of Contact: Face-to-Face  Telemedicine Service Delivery:   Is this Initial or Reassessment?   Date Telepsych consult ordered in [facility]:    Time Telepsych consult ordered in [facility]:    Location of Assessment: [facility] Assessment Services  Provider Location: [facility] Assessment Services    Collateral Involvement: NONE   Does Patient Have a Stage manager Guardian? No data recorded Name and Contact of Legal Guardian: No data recorded If Minor and Not Living with Parent(s), Who has Custody? NA  Is CPS  involved or ever been involved? Never  Is APS involved or ever been involved? Never   Patient Determined To Be At Risk for Harm To Self or Others Based on Review of Patient Reported Information or Presenting Complaint? No  Method: No Plan  Availability of Means: No access or NA  Intent: Vague intent or NA  Notification Required: No need or identified  person  Additional Information for Danger to Others Potential: -- (NA)  Additional Comments for Danger to Others Potential: NA  Are There Guns or Other Weapons in Your Home? No  Types of Guns/Weapons: NA  Are These Weapons Safely Secured?                            -- (NA)  Who Could Verify You Are Able To Have These Secured: AUNT  Do You Have any Outstanding Charges, Pending Court Dates, Parole/Probation? NO  Contacted To Inform of Risk of Harm To Self or Others: No data recorded  Does Patient Present under Involuntary Commitment? Yes    [place] of Residence: [place]   Patient Currently Receiving the Following Services: No data recorded  Determination of Need: Urgent (48 hours)   Options For Referral: Medication Management; Outpatient Therapy; Inpatient Hospitalization   Discharge Disposition:     Luther Redo, [facility]

## 2022-12-05 NOTE — ED Notes (Signed)
Patient denies SI, HI, and A/V/H with no plan or intent. Printed AVS reviewed and given to patient along with information of follow up appointments and resources. Patient verbalized all understanding. All belongings/valuables returned to patient. Patient is being transported by family member. No s/s of current distress.

## 2022-12-05 NOTE — Discharge Instructions (Signed)
Patient is instructed prior to discharge to:  Take all medications as prescribed by his/her mental healthcare provider. Report any adverse effects and or reactions from the medicines to his/her outpatient provider promptly. Keep all scheduled appointments, to ensure that you are getting refills on time and to avoid any interruption in your medication.  If you are unable to keep an appointment call to reschedule.  Be sure to follow-up with resources and follow-up appointments provided.  Patient has been instructed & cautioned: To not engage in alcohol and or illegal drug use while on prescription medicines. In the event of worsening symptoms, patient is instructed to call the crisis hotline, 911 and or go to the nearest ED for appropriate evaluation and treatment of symptoms. To follow-up with his/her primary care provider for your other medical issues, concerns and or health care needs.   Please come to [facility] during walk in hours to establish care for medication management or therapy:   Address:  [address] in [place], [place] Ph: [phone]   New patient medication management hours: Monday-Friday from 8 AM to 11 AM Please show up by 7:00 AM to ensure you are seen  New patient therapy walk in hours: Mondays through Thursdays from 7:30 AM to 4 PM Please show up by 7:00 AM to ensure you are seen

## 2022-12-05 NOTE — Progress Notes (Signed)
   12/05/22 1623  [place] (Walk-ins at [facility] only)  How Did You Hear About [place]? Other (Comment) (IVC)  What Is the Reason for Your Visit/Call Today? PT WAS FOUND SITTING ON A BRIDGE BY BHRT. PT WAS PLACED UNDER IVC AND PER IVC "[name] HAS SUICIDAL IDEATIONS AND ATTEMPTED SUICIDE IN THE PAST. TODAY [name] WAS PULLED OFF THE BRIDGE ON [address] RIGHT BEFORE SHE WAS GOING TO JUMP. AFTER BEING PHYSICALLY PULLED FROM BRIDGE AND PUT INSIDE THE VEHICLE, [name] TRIED TO GET OUT OF THE VEHICLE TO [place] BACK TO THE BRIDGE". PT DENIES SI, HI, AVH AND SUBSTANCE USE  How Long Has This Been Causing You Problems? 1 wk - 1 month  Have You Recently Had Any Thoughts About Hurting Yourself? No  Are You Planning to Commit Suicide/Harm Yourself At This time? No  Have you Recently Had Thoughts About [place]? No  Are You Planning To Harm Someone At This Time? No  Are you currently experiencing any auditory, visual or other hallucinations? No  Have You Used Any Alcohol or Drugs in the Past 24 Hours? No  Do you have any current medical co-morbidities that require immediate attention? No  Clinician description of patient physical appearance/behavior: ENGAGING BUT SOMEWHAT GUARDED  What Do You Feel Would Help You the Most Today? Treatment for Depression or other mood problem  If access to [facility] Urgent Care was not available, would you have sought care in the Emergency Department? No  Determination of Need Urgent (48 hours)  Options For Referral Medication Management;Outpatient Therapy;Inpatient Hospitalization

## 2023-06-11 ENCOUNTER — Ambulatory Visit (HOSPITAL_COMMUNITY)
Admission: RE | Admit: 2023-06-11 | Discharge: 2023-06-11 | Disposition: A | Discharge status: Home / Self Care | Payer: Self-pay | Source: Ambulatory Visit | Attending: Nurse Practitioner | Admitting: Nurse Practitioner

## 2023-06-11 ENCOUNTER — Other Ambulatory Visit: Payer: Self-pay

## 2023-06-11 ENCOUNTER — Encounter (HOSPITAL_COMMUNITY): Payer: Self-pay

## 2023-06-11 VITALS — BP 123/70 | HR 68 | Temp 99.2°F | Resp 18

## 2023-06-11 DIAGNOSIS — Z3202 Encounter for pregnancy test, result negative: Secondary | ICD-10-CM | POA: Insufficient documentation

## 2023-06-11 DIAGNOSIS — N939 Abnormal uterine and vaginal bleeding, unspecified: Secondary | ICD-10-CM | POA: Insufficient documentation

## 2023-06-11 LAB — POCT URINALYSIS DIP (MANUAL ENTRY)
Bilirubin, UA: NEGATIVE
Glucose, UA: NEGATIVE mg/dL
Ketones, POC UA: NEGATIVE mg/dL
Leukocytes, UA: NEGATIVE
Nitrite, UA: NEGATIVE
Protein Ur, POC: NEGATIVE mg/dL
Spec Grav, UA: 1.02 (ref 1.010–1.025)
Urobilinogen, UA: 1 E.U./dL
pH, UA: 7 (ref 5.0–8.0)

## 2023-06-11 LAB — POCT URINE PREGNANCY: Preg Test, Ur: NEGATIVE

## 2023-06-11 NOTE — ED Triage Notes (Addendum)
Reports vaginal bleeding.  Patient has nexplanon for 3.5 years.  Patient had some slight cramping and pink on tissue with wiping.  Denies pain with urination, has not noticed any difference in urinary routine.    Patient is also requesting std check up  Has not had any medications for symptoms

## 2023-06-11 NOTE — Discharge Instructions (Addendum)
As we discussed, the urine testing looks good today.  No signs of infection.  There is a little bit of blood in your urine, but I think this is coming from the vagina.  We we will contact you if any of the testing from today comes up positive.  Recommend condom use with every sexual counter to prevent STI.

## 2023-06-11 NOTE — ED Provider Notes (Signed)
[facility]    CSN: [id number] Arrival date & time: 06/11/23  1354      History   Chief Complaint Chief Complaint  Patient presents with   Appointment    14:00    HPI Cassidy Bruce is a 27 y.o. female.   Patient presents today with 2-day history of vaginal bleeding.  Denies itching, vaginal or pelvic pain, abdominal pain, nausea/vomiting, other vaginal discharge, or odor.  No rashes, sores, or lesions on her genitalia.  She also has a little bit of cramping in her lower abdomen.  No dysuria, urinary frequency or urgency.  No hematuria or foul urinary odor.  She has Nexplanon has been present for the past 3 years, has an appointment later this month to have it removed and replaced by OB/GYN.  Reports she had irregular light bleeding initially with Nexplanon for the first year, then no periods at all.  Requesting STI check today.  No known exposures to STI.    Past Medical History:  Diagnosis Date   ADHD    Depression     Patient Active Problem List   Diagnosis Date Noted   MDD (major depressive disorder), recurrent episode, severe (HCC) 07/25/2018   Adjustment disorder with mixed anxiety and depressed mood     History reviewed. No pertinent surgical history.  OB History   No obstetric history on file.      Home Medications    Prior to Admission medications   Medication Sig Start Date End Date Taking? Authorizing Provider  acetaminophen (TYLENOL) 500 MG tablet Take 1,000 mg by mouth every 6 (six) hours as needed for mild pain.    [provider]  Multiple Vitamin (MULTIVITAMIN PO) Take 1 tablet by mouth daily. Patient not taking: Reported on 06/11/2023    [provider]  Multiple Vitamins-Minerals (HAIR SKIN AND NAILS FORMULA PO) Take 1 tablet by mouth daily. Patient not taking: Reported on 06/11/2023    [provider]  olopatadine (PATANOL) 0.1 % ophthalmic solution Place 1 drop into both eyes 2 (two) times daily. Patient not  taking: Reported on 06/11/2023 03/28/22   Carlisle Beers, FNP    Family History Family History  Problem Relation Age of Onset   Cancer Other    Diabetes Other    CAD Other    Healthy Mother     Social History Social History   Tobacco Use   Smoking status: Never   Smokeless tobacco: Never  Vaping Use   Vaping status: Never Used  Substance Use Topics   Alcohol use: No   Drug use: No     Allergies   Patient has no known allergies.   Review of Systems Review of Systems Per HPI  Physical Exam Triage Vital Signs ED Triage Vitals  Encounter Vitals Group     BP 06/11/23 1425 123/70     Systolic BP Percentile --      Diastolic BP Percentile --      Pulse Rate 06/11/23 1425 68     Resp 06/11/23 1425 18     Temp 06/11/23 1425 99.2 F (37.3 C)     Temp Source 06/11/23 1425 Oral     SpO2 06/11/23 1425 98 %     Weight --      Height --      Head Circumference --      Peak Flow --      Pain Score 06/11/23 1423 0     Pain Loc --  Pain Education --      Exclude from Growth Chart --    No data found.  Updated Vital Signs BP 123/70 (BP Location: Left Arm)   Pulse 68   Temp 99.2 F (37.3 C) (Oral)   Resp 18   SpO2 98%   Visual Acuity Right Eye Distance:   Left Eye Distance:   Bilateral Distance:    Right Eye Near:   Left Eye Near:    Bilateral Near:     Physical Exam Vitals and nursing note reviewed.  Constitutional:      General: She is not in acute distress.    Appearance: Normal appearance. She is not toxic-appearing.  Pulmonary:     Effort: Pulmonary effort is normal. No respiratory distress.  Genitourinary:    Comments: Deferred-self swab performed by patient Skin:    General: Skin is warm and dry.     Coloration: Skin is not jaundiced or pale.     Findings: No erythema.  Neurological:     Mental Status: She is alert and oriented to person, place, and time.     Motor: No weakness.     Gait: Gait normal.  Psychiatric:        Mood  and Affect: Mood normal.        Behavior: Behavior is cooperative.      UC Treatments / Results  Labs (all labs ordered are listed, but only abnormal results are displayed) Labs Reviewed  POCT URINALYSIS DIP (MANUAL ENTRY) - Abnormal; Notable for the following components:      Result Value   Clarity, UA hazy (*)    Blood, UA small (*)    All other components within normal limits  POCT URINE PREGNANCY  CERVICOVAGINAL ANCILLARY ONLY    EKG   Radiology No results found.  Procedures Procedures (including critical care time)  Medications Ordered in UC Medications - No data to display  Initial Impression / Assessment and Plan / UC Course  I have reviewed the triage vital signs and the nursing notes.  Pertinent labs & imaging results that were available during my care of the patient were reviewed by me and considered in my medical decision making (see chart for details).   Patient is well-appearing, normotensive, afebrile, not tachycardic, not tachypneic, oxygenating well on room air.    1.Vaginal bleeding Urinalysis is hazy with small mount of blood Vaginal self swab is pending for STI Patient declines HIV and syphilis testing today Urine pregnancy test today is negative Suspect vaginal bleeding secondary to menstrual cycle Recommended follow-up with OB/GYN if symptoms do not improve, she has follow-up arranged already Strict ER precautions discussed with patient  2.Urine pregnancy test negative   The patient was given the opportunity to ask questions.  All questions answered to their satisfaction.  The patient is in agreement to this plan.    Final Clinical Impressions(s) / UC Diagnoses   Final diagnoses:  Vaginal bleeding  Urine pregnancy test negative     Discharge Instructions      As we discussed, the urine testing looks good today.  No signs of infection.  There is a little bit of blood in your urine, but I think this is coming from the vagina.  We we will  contact you if any of the testing from today comes up positive.  Recommend condom use with every sexual counter to prevent STI.    ED Prescriptions   None    PDMP not reviewed this encounter.  Valentino Nose, NP 06/11/23 1524

## 2023-06-15 ENCOUNTER — Telehealth (HOSPITAL_COMMUNITY): Payer: Self-pay

## 2023-06-15 MED ORDER — METRONIDAZOLE 500 MG PO TABS: 500.0000 mg | ORAL_TABLET | Freq: Two times a day (BID) | ORAL | 0 refills | Status: DC

## 2023-08-03 ENCOUNTER — Encounter (HOSPITAL_COMMUNITY): Payer: Self-pay | Admitting: Emergency Medicine

## 2023-08-03 ENCOUNTER — Other Ambulatory Visit: Payer: Self-pay

## 2023-08-03 ENCOUNTER — Ambulatory Visit (HOSPITAL_COMMUNITY)
Admission: EM | Admit: 2023-08-03 | Discharge: 2023-08-03 | Disposition: A | Discharge status: Home / Self Care | Payer: Self-pay | Attending: Emergency Medicine | Admitting: Emergency Medicine

## 2023-08-03 DIAGNOSIS — Z113 Encounter for screening for infections with a predominantly sexual mode of transmission: Secondary | ICD-10-CM | POA: Insufficient documentation

## 2023-08-03 DIAGNOSIS — N760 Acute vaginitis: Secondary | ICD-10-CM | POA: Insufficient documentation

## 2023-08-03 LAB — HIV ANTIBODY (ROUTINE TESTING W REFLEX): HIV Screen 4th Generation wRfx: NONREACTIVE

## 2023-08-03 MED ORDER — METRONIDAZOLE 500 MG PO TABS: 500.0000 mg | ORAL_TABLET | Freq: Two times a day (BID) | ORAL | 0 refills | Status: DC

## 2023-08-03 NOTE — ED Triage Notes (Signed)
Complains of burning, itching and vaginal discharge.  Symptoms started 5 days ago.  Symptoms worsened yesterday and today.    Otc AZO yeast plus medication was started saturday

## 2023-08-03 NOTE — ED Provider Notes (Signed)
[facility]    CSN: [id number] Arrival date & time: 08/03/23  1405      History   Chief Complaint Chief Complaint  Patient presents with   Vaginal Discharge    HPI Cassidy Bruce is a 27 y.o. female.   Patient presents to clinic for complaints of vaginal itching, burning and increased to vaginal discharge.  Denies any odor or color changes to her discharge.  Symptoms have been present for the past 5 days, but worsened yesterday and today.  She has been taking over-the-counter Azo yeast for symptom management.  She has had yeast infections in years prior, currently has been having bacterial vaginosis infections the past few years.  Would like screening for HIV and syphilis.  Denies any known exposures to any STIs.  Denies dysuria, hematuria, suprapubic pain, flank pain, nausea, vomiting or fevers.  No vaginal sores or lesions.  The history is provided by the patient and medical records.  Vaginal Discharge Associated symptoms: no abdominal pain, no dysuria, no fever, no nausea and no vomiting     Past Medical History:  Diagnosis Date   ADHD    Depression     Patient Active Problem List   Diagnosis Date Noted   MDD (major depressive disorder), recurrent episode, severe (HCC) 07/25/2018   Adjustment disorder with mixed anxiety and depressed mood     History reviewed. No pertinent surgical history.  OB History   No obstetric history on file.      Home Medications    Prior to Admission medications   Medication Sig Start Date End Date Taking? Authorizing Provider  metroNIDAZOLE (FLAGYL) 500 MG tablet Take 1 tablet (500 mg total) by mouth 2 (two) times daily. 08/03/23  Yes Rinaldo Ratel, [place] N, FNP  acetaminophen (TYLENOL) 500 MG tablet Take 1,000 mg by mouth every 6 (six) hours as needed for mild pain.    [provider]    Family History Family History  Problem Relation Age of Onset   Cancer Other    Diabetes Other    CAD Other    Healthy  Mother     Social History Social History   Tobacco Use   Smoking status: Never   Smokeless tobacco: Never  Vaping Use   Vaping status: Never Used  Substance Use Topics   Alcohol use: No   Drug use: No     Allergies   Patient has no known allergies.   Review of Systems Review of Systems  Constitutional:  Negative for fever.  Gastrointestinal:  Negative for abdominal pain, nausea and vomiting.  Genitourinary:  Positive for vaginal discharge. Negative for dysuria and flank pain.     Physical Exam Triage Vital Signs ED Triage Vitals  Encounter Vitals Group     BP 08/03/23 1458 125/67     Systolic BP Percentile --      Diastolic BP Percentile --      Pulse Rate 08/03/23 1458 63     Resp 08/03/23 1458 18     Temp 08/03/23 1458 98.5 F (36.9 C)     Temp Source 08/03/23 1458 Oral     SpO2 08/03/23 1458 98 %     Weight --      Height --      Head Circumference --      Peak Flow --      Pain Score 08/03/23 1457 6     Pain Loc --      Pain Education --  Exclude from Growth Chart --    No data found.  Updated Vital Signs BP 125/67 (BP Location: Left Arm)   Pulse 63   Temp 98.5 F (36.9 C) (Oral)   Resp 18   SpO2 98%   Visual Acuity Right Eye Distance:   Left Eye Distance:   Bilateral Distance:    Right Eye Near:   Left Eye Near:    Bilateral Near:     Physical Exam Vitals and nursing note reviewed.  Constitutional:      Appearance: Normal appearance.  HENT:     Head: Normocephalic and atraumatic.     Right Ear: External ear normal.     Left Ear: External ear normal.     Nose: Nose normal.     Mouth/Throat:     Mouth: Mucous membranes are moist.  Cardiovascular:     Rate and Rhythm: Normal rate.  Pulmonary:     Effort: Pulmonary effort is normal. No respiratory distress.  Musculoskeletal:        General: Normal range of motion.  Neurological:     General: No focal deficit present.     Mental Status: She is alert and oriented to person,  place, and time.  Psychiatric:        Mood and Affect: Mood normal.        Behavior: Behavior normal.      UC Treatments / Results  Labs (all labs ordered are listed, but only abnormal results are displayed) Labs Reviewed  HIV ANTIBODY (ROUTINE TESTING W REFLEX)  RPR  CERVICOVAGINAL ANCILLARY ONLY    EKG   Radiology No results found.  Procedures Procedures (including critical care time)  Medications Ordered in UC Medications - No data to display  Initial Impression / Assessment and Plan / UC Course  I have reviewed the triage vital signs and the nursing notes.  Pertinent labs & imaging results that were available during my care of the patient were reviewed by me and considered in my medical decision making (see chart for details).  Vitals and triage reviewed, patient is hemodynamically stable.  Symptoms of vaginitis on exam, denies dysuria, flank pain, nausea, vomiting or fevers, low concern for pyelonephritis.  Cytology swab obtained, recurrent BV infections, will treat with Flagyl.  Staff will contact if treatment modification is needed.  Plan of care, follow-up care and return precautions given, no questions at this time.     Final Clinical Impressions(s) / UC Diagnoses   Final diagnoses:  Acute vaginitis  Screening examination for sexually transmitted disease     Discharge Instructions      I am starting you on the Flagyl, take this with food twice daily.  We will contact you if we need to modify your treatment plan and if you have any positive results.  Abstain from intercourse until results have been received and notify any sexual partners of any positive sexually-transmitted infections.  Return to clinic for any new or urgent symptoms.     ED Prescriptions     Medication Sig Dispense Auth. Provider   metroNIDAZOLE (FLAGYL) 500 MG tablet Take 1 tablet (500 mg total) by mouth 2 (two) times daily. 14 tablet Merrilee Ancona, [place] N, [place]      PDMP not  reviewed this encounter.   Valmai Vandenberghe, [place] N, [place] 08/03/23 [phone]

## 2023-08-03 NOTE — Discharge Instructions (Addendum)
I am starting you on the Flagyl, take this with food twice daily.  We will contact you if we need to modify your treatment plan and if you have any positive results.  Abstain from intercourse until results have been received and notify any sexual partners of any positive sexually-transmitted infections.  Return to clinic for any new or urgent symptoms.

## 2023-08-04 LAB — RPR: RPR Ser Ql: NONREACTIVE

## 2023-08-04 LAB — CERVICOVAGINAL ANCILLARY ONLY
Bacterial Vaginitis (gardnerella): NEGATIVE
Candida Glabrata: NEGATIVE
Candida Vaginitis: POSITIVE — AB
Chlamydia: NEGATIVE
Comment: NEGATIVE
Comment: NEGATIVE
Comment: NEGATIVE
Comment: NEGATIVE
Comment: NEGATIVE
Comment: NORMAL
Neisseria Gonorrhea: NEGATIVE
Trichomonas: NEGATIVE

## 2023-08-05 ENCOUNTER — Telehealth (HOSPITAL_COMMUNITY): Payer: Self-pay | Admitting: Emergency Medicine

## 2023-08-05 MED ORDER — FLUCONAZOLE 150 MG PO TABS: ORAL_TABLET | ORAL | 0 refills | Status: DC

## 2023-08-05 NOTE — Telephone Encounter (Signed)
Discussed results of positive yeast via telephone, verifying 2 patient identifiers.  Advised to stop Flagyl and start Diflucan.  Patient verbalized understanding.

## 2023-09-16 ENCOUNTER — Ambulatory Visit (HOSPITAL_COMMUNITY)
Admission: EM | Admit: 2023-09-16 | Discharge: 2023-09-16 | Disposition: A | Discharge status: Home / Self Care | Payer: Self-pay | Attending: Family Medicine | Admitting: Family Medicine

## 2023-09-16 ENCOUNTER — Encounter (HOSPITAL_COMMUNITY): Payer: Self-pay

## 2023-09-16 DIAGNOSIS — R197 Diarrhea, unspecified: Secondary | ICD-10-CM

## 2023-09-16 DIAGNOSIS — R519 Headache, unspecified: Secondary | ICD-10-CM

## 2023-09-16 DIAGNOSIS — R110 Nausea: Secondary | ICD-10-CM

## 2023-09-16 LAB — POCT URINALYSIS DIP (MANUAL ENTRY)
Bilirubin, UA: NEGATIVE
Blood, UA: NEGATIVE
Glucose, UA: NEGATIVE mg/dL
Ketones, POC UA: NEGATIVE mg/dL
Leukocytes, UA: NEGATIVE
Nitrite, UA: NEGATIVE
Protein Ur, POC: NEGATIVE mg/dL
Spec Grav, UA: 1.02 (ref 1.010–1.025)
Urobilinogen, UA: 1 U/dL
pH, UA: 7.5 (ref 5.0–8.0)

## 2023-09-16 LAB — POCT URINE PREGNANCY: Preg Test, Ur: NEGATIVE

## 2023-09-16 MED ORDER — METOCLOPRAMIDE HCL 5 MG/ML IJ SOLN
INTRAMUSCULAR | Status: AC
  Filled 2023-09-16: qty 2

## 2023-09-16 MED ORDER — ONDANSETRON 4 MG PO TBDP: 4.0000 mg | ORAL_TABLET | Freq: Three times a day (TID) | ORAL | 0 refills | Status: DC | PRN

## 2023-09-16 MED ORDER — KETOROLAC TROMETHAMINE 60 MG/2ML IM SOLN
INTRAMUSCULAR | Status: AC
  Filled 2023-09-16: qty 2

## 2023-09-16 MED ORDER — ONDANSETRON 4 MG PO TBDP
ORAL_TABLET | ORAL | Status: AC
  Filled 2023-09-16: qty 1

## 2023-09-16 MED ORDER — METOCLOPRAMIDE HCL 5 MG/ML IJ SOLN
5.0000 mg | Freq: Once | INTRAMUSCULAR | Status: AC
  Administered 2023-09-16: 5 mg via INTRAMUSCULAR

## 2023-09-16 MED ORDER — ONDANSETRON 4 MG PO TBDP
4.0000 mg | ORAL_TABLET | Freq: Once | ORAL | Status: AC
  Administered 2023-09-16: 4 mg via ORAL

## 2023-09-16 MED ORDER — KETOROLAC TROMETHAMINE 60 MG/2ML IM SOLN
60.0000 mg | Freq: Once | INTRAMUSCULAR | Status: AC
  Administered 2023-09-16: 60 mg via INTRAMUSCULAR

## 2023-09-16 NOTE — ED Triage Notes (Signed)
Pt presents with reported migraine, nausea, and diarrhea starting this morning upon wakening. Pt denies taking medications for her symptoms at this time.

## 2023-09-19 NOTE — ED Provider Notes (Signed)
[facility]   [id number] 09/16/23 Arrival Time: 0920  ASSESSMENT & PLAN:  1. Bad headache   2. Nausea without vomiting   3. Diarrhea, unspecified type    Meds ordered this encounter  Medications   ketorolac (TORADOL) injection 60 mg   metoCLOPramide (REGLAN) injection 5 mg   ondansetron (ZOFRAN-ODT) disintegrating tablet 4 mg   ondansetron (ZOFRAN-ODT) 4 MG disintegrating tablet    Sig: Take 1 tablet (4 mg total) by mouth every 8 (eight) hours as needed for nausea or vomiting.    Dispense:  15 tablet    Refill:  0  Likely mild gastroenteritis with headache.  Results for orders placed or performed during the hospital encounter of 09/16/23  POC urinalysis dipstick  Result Value Ref Range   Color, UA yellow yellow   Clarity, UA clear clear   Glucose, UA negative negative mg/dL   Bilirubin, UA negative negative   Ketones, POC UA negative negative mg/dL   Spec Grav, UA 4.098 1.191 - 1.025   Blood, UA negative negative   pH, UA 7.5 5.0 - 8.0   Protein Ur, POC negative negative mg/dL   Urobilinogen, UA 1.0 0.2 or 1.0 E.U./dL   Nitrite, UA Negative Negative   Leukocytes, UA Negative Negative  POCT urine pregnancy  Result Value Ref Range   Preg Test, Ur Negative Negative   Feeling much better after medications. Comfortable with home observation.  Normal neurological exam.  Recommend:  Follow-up Information     [facility].   Specialty: Emergency Medicine Why: If symptoms worsen in any way. Contact information: [address] [place] [address] [phone]                 Reviewed expectations re: course of current medical issues. Questions answered. Outlined signs and symptoms indicating need for more acute intervention. Patient verbalized understanding. After Visit Summary given.   SUBJECTIVE: History from: Pt Patient is able to give a clear and coherent history.  Cassidy Bruce is a 27 y.o. female who presents with complaint of a frontal headache. Along with n/v/d upon waking this morning. HA is throbbing. Denies fever. No tx PTA.   OBJECTIVE:  Vitals:   09/16/23 1052 09/16/23 1053  BP: 125/87   Pulse: 63   Resp: 18   Temp: 98.4 F (36.9 C)   TempSrc: Oral   SpO2: 98%   Weight:  63.5 kg  Height:  5\' 5"  (1.651 m)    General appearance: alert; NAD HENT: normocephalic; atraumatic Eyes: PERRLA; EOMI; conjunctivae normal Neck: supple with FROM Lungs: clear to auscultation bilaterally; unlabored respirations Heart: regular rate and rhythm Extremities: no edema; symmetrical with no gross deformities Skin: warm and dry Neurologic: alert; speech is fluent and clear without dysarthria or aphasia; CN 2-12 grossly intact; no facial droop; normal gait; normal symmetric reflexes; normal extremity strength and sensation throughout; bilateral upper and lower extremity sensation is grossly intact with 5/5 symmetric strength; normal grip strength Psychological: alert and cooperative; normal mood and affect  Labs: Results for orders placed or performed during the hospital encounter of 09/16/23  POC urinalysis dipstick  Result Value Ref Range   Color, UA yellow yellow   Clarity, UA clear clear   Glucose, UA negative negative mg/dL   Bilirubin, UA negative negative   Ketones, POC UA negative negative mg/dL   Spec Grav, UA 8.469 6.295 - 1.025   Blood, UA negative negative   pH, UA  7.5 5.0 - 8.0   Protein Ur, POC negative negative mg/dL   Urobilinogen, UA 1.0 0.2 or 1.0 E.U./dL   Nitrite, UA Negative Negative   Leukocytes, UA Negative Negative  POCT urine pregnancy  Result Value Ref Range   Preg Test, Ur Negative Negative   Labs Reviewed  POCT URINALYSIS DIP (MANUAL ENTRY)  POCT URINE PREGNANCY     No Known Allergies  Past Medical History:  Diagnosis Date   ADHD    Depression    Social History   Socioeconomic History   Marital status: Single     Spouse name: Not on file   Number of children: Not on file   Years of education: Not on file   Highest education level: Not on file  Occupational History   Not on file  Tobacco Use   Smoking status: Never   Smokeless tobacco: Never  Vaping Use   Vaping status: Never Used  Substance and Sexual Activity   Alcohol use: No   Drug use: No   Sexual activity: Yes    Birth control/protection: Implant  Other Topics Concern   Not on file  Social History Narrative   Not on file   Social Determinants of Health   Financial Resource Strain: Not on file  Food Insecurity: Not on file  Transportation Needs: Not on file  Physical Activity: Not on file  Stress: Not on file  Social Connections: Unknown (07/09/2023)   Received from [facility]   Social Network    Social Network: Not on file  Intimate Partner Violence: Unknown (07/09/2023)   Received from [facility]   HITS    Physically Hurt: Not on file    Insult or Talk Down To: Not on file    Threaten Physical Harm: Not on file    Scream or Curse: Not on file   Family History  Problem Relation Age of Onset   Cancer Other    Diabetes Other    CAD Other    Healthy Mother    History reviewed. No pertinent surgical history.    Mardella Layman, MD 09/19/23 1034

## 2023-11-11 ENCOUNTER — Ambulatory Visit (HOSPITAL_COMMUNITY)
Admission: RE | Admit: 2023-11-11 | Discharge: 2023-11-11 | Disposition: A | Discharge status: Home / Self Care | Payer: Self-pay | Source: Ambulatory Visit | Attending: Emergency Medicine | Admitting: Emergency Medicine

## 2023-11-11 ENCOUNTER — Encounter (HOSPITAL_COMMUNITY): Payer: Self-pay

## 2023-11-11 VITALS — BP 123/77 | HR 83 | Temp 98.4°F | Resp 16

## 2023-11-11 DIAGNOSIS — N898 Other specified noninflammatory disorders of vagina: Secondary | ICD-10-CM | POA: Insufficient documentation

## 2023-11-11 MED ORDER — METRONIDAZOLE 500 MG PO TABS: 500.0000 mg | ORAL_TABLET | Freq: Two times a day (BID) | ORAL | 0 refills | Status: DC

## 2023-11-11 NOTE — Discharge Instructions (Addendum)
 Start the Flagyl  for treatment of presumed bacterial vaginosis.  The swab results will be available over the next day or so and our staff will contact you if any treatment modification is needed.  Abstain from intercourse until all results have been received.  Notify any sexual partners of any positive sexually transmitted infections.  Return to clinic for any new or urgent symptoms.

## 2023-11-11 NOTE — ED Provider Notes (Signed)
 [facility]    CSN: [id number] Arrival date & time: 11/11/23  1621      History   Chief Complaint Chief Complaint  Patient presents with   Vaginal Discharge    Vaginal order during sex. - Entered by patient    HPI Cassidy Bruce is a 28 y.o. female.   Patient presents to clinic concerned over a vaginal odor that she has noticed during intercourse over the past few days. Denies any changes to vaginal discharge. No abdominal pain. No urinary symptoms.  Has a consistent partner who she has intercourse with, they do not use condoms.  Patient has a Nexplanon  for contraception.  Last menstrual cycle on December 13th 2024.  Denies concerns for STIs.  The history is provided by the patient and medical records.  Vaginal Discharge   Past Medical History:  Diagnosis Date   ADHD    Depression     Patient Active Problem List   Diagnosis Date Noted   MDD (major depressive disorder), recurrent episode, severe (HCC) 07/25/2018   Adjustment disorder with mixed anxiety and depressed mood     History reviewed. No pertinent surgical history.  OB History   No obstetric history on file.      Home Medications    Prior to Admission medications   Medication Sig Start Date End Date Taking? Authorizing Provider  etonogestrel  (NEXPLANON ) 68 MG IMPL implant Inject into the skin.   Yes [provider]  metroNIDAZOLE  (FLAGYL ) 500 MG tablet Take 1 tablet (500 mg total) by mouth 2 (two) times daily. 11/11/23  Yes Norleen Xie  N, FNP  ondansetron  (ZOFRAN -ODT) 4 MG disintegrating tablet Take 1 tablet (4 mg total) by mouth every 8 (eight) hours as needed for nausea or vomiting. 09/16/23   Rolinda Rogue, MD    Family History Family History  Problem Relation Age of Onset   Cancer Other    Diabetes Other    CAD Other    Healthy Mother     Social History Social History   Tobacco Use   Smoking status: Never   Smokeless tobacco: Never  Vaping Use   Vaping status:  Never Used  Substance Use Topics   Alcohol use: No   Drug use: No     Allergies   Patient has no known allergies.   Review of Systems Review of Systems  Per HPI   Physical Exam Triage Vital Signs ED Triage Vitals [11/11/23 1656]  Encounter Vitals Group     BP 123/77     Systolic BP Percentile      Diastolic BP Percentile      Pulse Rate 83     Resp 16     Temp 98.4 F (36.9 C)     Temp Source Oral     SpO2 99 %     Weight      Height      Head Circumference      Peak Flow      Pain Score      Pain Loc      Pain Education      Exclude from Growth Chart    No data found.  Updated Vital Signs BP 123/77 (BP Location: Left Arm)   Pulse 83   Temp 98.4 F (36.9 C) (Oral)   Resp 16   LMP 10/16/2023   SpO2 99%   Visual Acuity Right Eye Distance:   Left Eye Distance:   Bilateral Distance:    Right Eye Near:  Left Eye Near:    Bilateral Near:     Physical Exam Vitals and nursing note reviewed.  Constitutional:      Appearance: Normal appearance.  HENT:     Head: Normocephalic and atraumatic.     Right Ear: External ear normal.     Left Ear: External ear normal.     Nose: Nose normal.     Mouth/Throat:     Mouth: Mucous membranes are moist.  Eyes:     Conjunctiva/sclera: Conjunctivae normal.  Cardiovascular:     Rate and Rhythm: Normal rate.  Pulmonary:     Effort: Pulmonary effort is normal. No respiratory distress.  Musculoskeletal:        General: Normal range of motion.  Neurological:     General: No focal deficit present.     Mental Status: She is alert and oriented to person, place, and time.  Psychiatric:        Mood and Affect: Mood normal.        Behavior: Behavior normal.      UC Treatments / Results  Labs (all labs ordered are listed, but only abnormal results are displayed) Labs Reviewed  CERVICOVAGINAL ANCILLARY ONLY    EKG   Radiology No results found.  Procedures Procedures (including critical care  time)  Medications Ordered in UC Medications - No data to display  Initial Impression / Assessment and Plan / UC Course  I have reviewed the triage vital signs and the nursing notes.  Pertinent labs & imaging results that were available during my care of the patient were reviewed by me and considered in my medical decision making (see chart for details).  Vitals and triage reviewed, patient is hemodynamically stable.  No vaginal itching or urinary symptoms.  Vaginal odor with intercourse.  History of BV and yeast.  Cytology swab obtained.  Will treat empirically for bacterial vaginosis with Flagyl .  Plan of care, follow-up care and return precautions given, no questions at this time.      Final Clinical Impressions(s) / UC Diagnoses   Final diagnoses:  Vaginal odor     Discharge Instructions      Start the Flagyl  for treatment of presumed bacterial vaginosis.  The swab results will be available over the next day or so and our staff will contact you if any treatment modification is needed.  Abstain from intercourse until all results have been received.  Notify any sexual partners of any positive sexually transmitted infections.  Return to clinic for any new or urgent symptoms.     ED Prescriptions     Medication Sig Dispense Auth. Provider   metroNIDAZOLE  (FLAGYL ) 500 MG tablet Take 1 tablet (500 mg total) by mouth 2 (two) times daily. 14 tablet Dreama, Bernhard Koskinen  N, FNP      PDMP not reviewed this encounter.   Dreama Lorence SAILOR, FNP 11/11/23 [phone]

## 2023-11-11 NOTE — ED Triage Notes (Signed)
 Patient is for vaginal odor x 3 days. No other symptoms at this time.

## 2023-11-12 LAB — CERVICOVAGINAL ANCILLARY ONLY
Bacterial Vaginitis (gardnerella): POSITIVE — AB
Candida Glabrata: NEGATIVE
Candida Vaginitis: NEGATIVE
Chlamydia: NEGATIVE
Comment: NEGATIVE
Comment: NEGATIVE
Comment: NEGATIVE
Comment: NEGATIVE
Comment: NEGATIVE
Comment: NORMAL
Neisseria Gonorrhea: NEGATIVE
Trichomonas: NEGATIVE

## 2023-12-25 ENCOUNTER — Ambulatory Visit (HOSPITAL_COMMUNITY)
Admission: EM | Admit: 2023-12-25 | Discharge: 2023-12-25 | Disposition: A | Discharge status: Home / Self Care | Payer: Self-pay | Attending: Family Medicine | Admitting: Family Medicine

## 2023-12-25 ENCOUNTER — Encounter (HOSPITAL_COMMUNITY): Payer: Self-pay

## 2023-12-25 DIAGNOSIS — J069 Acute upper respiratory infection, unspecified: Secondary | ICD-10-CM | POA: Insufficient documentation

## 2023-12-25 LAB — POC COVID19/FLU A&B COMBO
Covid Antigen, POC: NEGATIVE
Influenza A Antigen, POC: NEGATIVE
Influenza B Antigen, POC: NEGATIVE

## 2023-12-25 LAB — POCT RAPID STREP A (OFFICE): Rapid Strep A Screen: NEGATIVE

## 2023-12-25 MED ORDER — PROMETHAZINE-DM 6.25-15 MG/5ML PO SYRP: 5.0000 mL | ORAL_SOLUTION | Freq: Four times a day (QID) | ORAL | 0 refills | Status: DC | PRN

## 2023-12-25 NOTE — ED Triage Notes (Signed)
Patient presents with sore throat and headache x 2 days. Patient states she is coughing up blood.

## 2023-12-25 NOTE — ED Provider Notes (Addendum)
[facility]    CSN: [id number] Arrival date & time: 12/25/23  1406      History   Chief Complaint Chief Complaint  Patient presents with   Sore Throat   Cough   Headache    HPI Cassidy Bruce is a 28 y.o. female.    Sore Throat Associated symptoms include headaches.  Cough Associated symptoms: chills, headaches, rhinorrhea and sore throat   Associated symptoms: no fever   Headache Associated symptoms: congestion, cough and sore throat   Associated symptoms: no fever    Patient is here for sore throat, cough, headache x 2 days.  She was coughing up phlegm mixed with blood yesterday.  She had chills 2 days ago, no fevers.  No n/v.  No wheezing or sob.  No known sick contacts.  She did use theraflu last night        Past Medical History:  Diagnosis Date   ADHD    Depression     Patient Active Problem List   Diagnosis Date Noted   MDD (major depressive disorder), recurrent episode, severe (HCC) 07/25/2018   Adjustment disorder with mixed anxiety and depressed mood     History reviewed. No pertinent surgical history.  OB History   No obstetric history on file.      Home Medications    Prior to Admission medications   Medication Sig Start Date End Date Taking? Authorizing Provider  etonogestrel (NEXPLANON) 68 MG IMPL implant Inject into the skin.    [provider]  metroNIDAZOLE (FLAGYL) 500 MG tablet Take 1 tablet (500 mg total) by mouth 2 (two) times daily. 11/11/23   Garrison, [place] N, FNP  ondansetron (ZOFRAN-ODT) 4 MG disintegrating tablet Take 1 tablet (4 mg total) by mouth every 8 (eight) hours as needed for nausea or vomiting. 09/16/23   Mardella Layman, MD    Family History Family History  Problem Relation Age of Onset   Cancer Other    Diabetes Other    CAD Other    Healthy Mother     Social History Social History   Tobacco Use   Smoking status: Never   Smokeless tobacco: Never  Vaping Use   Vaping status:  Never Used  Substance Use Topics   Alcohol use: No   Drug use: No     Allergies   Patient has no known allergies.   Review of Systems Review of Systems  Constitutional:  Positive for chills. Negative for fever.  HENT:  Positive for congestion, rhinorrhea and sore throat.   Respiratory:  Positive for cough.   Gastrointestinal: Negative.   Genitourinary: Negative.   Musculoskeletal: Negative.   Neurological:  Positive for headaches.  Psychiatric/Behavioral: Negative.       Physical Exam Triage Vital Signs ED Triage Vitals  Encounter Vitals Group     BP 12/25/23 1451 119/76     Systolic BP Percentile --      Diastolic BP Percentile --      Pulse Rate 12/25/23 1451 75     Resp 12/25/23 1451 18     Temp 12/25/23 1451 97.9 F (36.6 C)     Temp Source 12/25/23 1451 Oral     SpO2 12/25/23 1451 96 %     Weight --      Height --      Head Circumference --      Peak Flow --      Pain Score 12/25/23 1454 3     Pain Loc --  Pain Education --      Exclude from Growth Chart --    No data found.  Updated Vital Signs BP 119/76 (BP Location: Left Arm)   Pulse 75   Temp 97.9 F (36.6 C) (Oral)   Resp 18   LMP 12/07/2023   SpO2 96%   Visual Acuity Right Eye Distance:   Left Eye Distance:   Bilateral Distance:    Right Eye Near:   Left Eye Near:    Bilateral Near:     Physical Exam Constitutional:      Appearance: She is well-developed and normal weight.  HENT:     Head: Normocephalic.     Nose: Congestion present.     Mouth/Throat:     Mouth: Mucous membranes are moist.     Pharynx: Posterior oropharyngeal erythema present. No oropharyngeal exudate.     Tonsils: No tonsillar exudate.  Cardiovascular:     Rate and Rhythm: Normal rate and regular rhythm.     Heart sounds: Normal heart sounds.  Pulmonary:     Effort: Pulmonary effort is normal.     Breath sounds: Normal breath sounds. No wheezing or rhonchi.  Musculoskeletal:     Cervical back: Normal  range of motion and neck supple.  Neurological:     General: No focal deficit present.     Mental Status: She is alert.  Psychiatric:        Mood and Affect: Mood normal.      UC Treatments / Results  Labs (all labs ordered are listed, but only abnormal results are displayed) Labs Reviewed  CULTURE, GROUP A STREP ([facility])  POC COVID19/FLU A&B COMBO  POCT RAPID STREP A (OFFICE)    EKG   Radiology No results found.  Procedures Procedures (including critical care time)  Medications Ordered in UC Medications - No data to display  Initial Impression / Assessment and Plan / UC Course  I have reviewed the triage vital signs and the nursing notes.  Pertinent labs & imaging results that were available during my care of the patient were reviewed by me and considered in my medical decision making (see chart for details).   Final Clinical Impressions(s) / UC Diagnoses   Final diagnoses:  Upper respiratory tract infection, unspecified type     Discharge Instructions      You were seen today for upper respiratory symptoms.  Your flu, covid and strep tests were negative today.  This appears to be due to another viral cause.  I have sent out a medication to help with the cough.  I recommend you use tylenol, motrin, and salt water gargles for headache and sore throat.  Please get plenty of rest and increase fluids.  Please return if you are not improving or worsening.     ED Prescriptions     Medication Sig Dispense Auth. Provider   promethazine-dextromethorphan (PROMETHAZINE-DM) 6.25-15 MG/5ML syrup Take 5 mLs by mouth 4 (four) times daily as needed for cough. 118 mL Jannifer Franklin, MD      PDMP not reviewed this encounter.   Jannifer Franklin, MD 12/25/23 1545    Jannifer Franklin, MD 12/25/23 [phone]

## 2023-12-25 NOTE — Discharge Instructions (Addendum)
You were seen today for upper respiratory symptoms.  Your flu, covid and strep tests were negative today.  This appears to be due to another viral cause.  I have sent out a medication to help with the cough.  I recommend you use tylenol, motrin, and salt water gargles for headache and sore throat.  Please get plenty of rest and increase fluids.  Please return if you are not improving or worsening.

## 2023-12-27 LAB — CULTURE, GROUP A STREP (THRC)

## 2024-01-29 ENCOUNTER — Ambulatory Visit (HOSPITAL_COMMUNITY)
Admission: RE | Admit: 2024-01-29 | Discharge: 2024-01-29 | Disposition: A | Discharge status: Home / Self Care | Payer: Self-pay | Source: Ambulatory Visit | Attending: Emergency Medicine | Admitting: Emergency Medicine

## 2024-01-29 ENCOUNTER — Encounter (HOSPITAL_COMMUNITY): Payer: Self-pay

## 2024-01-29 VITALS — BP 113/74 | HR 79 | Temp 98.1°F | Resp 14

## 2024-01-29 DIAGNOSIS — N898 Other specified noninflammatory disorders of vagina: Secondary | ICD-10-CM | POA: Insufficient documentation

## 2024-01-29 DIAGNOSIS — B3731 Acute candidiasis of vulva and vagina: Secondary | ICD-10-CM | POA: Insufficient documentation

## 2024-01-29 DIAGNOSIS — Z113 Encounter for screening for infections with a predominantly sexual mode of transmission: Secondary | ICD-10-CM | POA: Insufficient documentation

## 2024-01-29 LAB — HIV ANTIBODY (ROUTINE TESTING W REFLEX): HIV Screen 4th Generation wRfx: NONREACTIVE

## 2024-01-29 MED ORDER — FLUCONAZOLE 150 MG PO TABS: ORAL_TABLET | ORAL | 0 refills | Status: DC

## 2024-01-29 NOTE — Discharge Instructions (Addendum)
 I prescribed Diflucan for possible yeast infection coverage.  Take 1 tablet today and 1 tablet in 3 days a symptoms persist. Your results will come back over the next few days and someone will call if results are positive and require adjustment to treatment plan.  Return here as needed.

## 2024-01-29 NOTE — ED Provider Notes (Signed)
 [facility]    CSN: [id number] Arrival date & time: 01/29/24  0932      History   Chief Complaint Chief Complaint  Patient presents with   Vaginal Discharge    Having vaginal discharge with odor while sexually active. Would like to get tested for any std and HIV. - Entered by patient    HPI Cassidy Bruce is a 28 y.o. female.   Patient presents with white clumpy vaginal discharge x 3 weeks.  Patient reports history of BV and yeast.  Patient is also requesting STD testing due to recent unprotected sexual intercourse.  Denies vaginal pain, vaginal bleeding, dysuria, urinary frequency, hematuria, flank pain, and fever.  Patient reports LMP 01/13/2024   Vaginal Discharge   Past Medical History:  Diagnosis Date   ADHD    Depression     Patient Active Problem List   Diagnosis Date Noted   MDD (major depressive disorder), recurrent episode, severe (HCC) 07/25/2018   Adjustment disorder with mixed anxiety and depressed mood     History reviewed. No pertinent surgical history.  OB History   No obstetric history on file.      Home Medications    Prior to Admission medications   Medication Sig Start Date End Date Taking? Authorizing Provider  fluconazole (DIFLUCAN) 150 MG tablet Take one tablet today and one tablet in 3 days if symptoms persist. 01/29/24  Yes Letta Kocher, NP    Family History Family History  Problem Relation Age of Onset   Cancer Other    Diabetes Other    CAD Other    Healthy Mother     Social History Social History   Tobacco Use   Smoking status: Never   Smokeless tobacco: Never  Vaping Use   Vaping status: Never Used  Substance Use Topics   Alcohol use: No   Drug use: No     Allergies   Patient has no known allergies.   Review of Systems Review of Systems  Genitourinary:  Positive for vaginal discharge.   Per HPI  Physical Exam Triage Vital Signs ED Triage Vitals [01/29/24 0941]  Encounter Vitals  Group     BP 113/74     Systolic BP Percentile      Diastolic BP Percentile      Pulse Rate 79     Resp 14     Temp 98.1 F (36.7 C)     Temp Source Oral     SpO2 95 %     Weight      Height      Head Circumference      Peak Flow      Pain Score 0     Pain Loc      Pain Education      Exclude from Growth Chart    No data found.  Updated Vital Signs BP 113/74 (BP Location: Right Arm)   Pulse 79   Temp 98.1 F (36.7 C) (Oral)   Resp 14   SpO2 95%   Visual Acuity Right Eye Distance:   Left Eye Distance:   Bilateral Distance:    Right Eye Near:   Left Eye Near:    Bilateral Near:     Physical Exam Vitals and nursing note reviewed.  Constitutional:      General: She is awake. She is not in acute distress.    Appearance: Normal appearance. She is well-developed and well-groomed. She is not ill-appearing.  Genitourinary:  Comments: Exam deferred Skin:    General: Skin is warm and dry.  Neurological:     Mental Status: She is alert.  Psychiatric:        Behavior: Behavior is cooperative.      UC Treatments / Results  Labs (all labs ordered are listed, but only abnormal results are displayed) Labs Reviewed  HIV ANTIBODY (ROUTINE TESTING W REFLEX)  RPR  CERVICOVAGINAL ANCILLARY ONLY    EKG   Radiology No results found.  Procedures Procedures (including critical care time)  Medications Ordered in UC Medications - No data to display  Initial Impression / Assessment and Plan / UC Course  I have reviewed the triage vital signs and the nursing notes.  Pertinent labs & imaging results that were available during my care of the patient were reviewed by me and considered in my medical decision making (see chart for details).     GU exam deferred, patient perform self swab for STD/STI.  HIV and syphilis testing ordered.  Prescribed Diflucan for yeast infection coverage.  Discussed follow-up and return precautions. Final Clinical Impressions(s) / UC  Diagnoses   Final diagnoses:  Yeast vaginitis  Screening for STD (sexually transmitted disease)  Vaginal discharge     Discharge Instructions      I prescribed Diflucan for possible yeast infection coverage.  Take 1 tablet today and 1 tablet in 3 days a symptoms persist. Your results will come back over the next few days and someone will call if results are positive and require adjustment to treatment plan.  Return here as needed.     ED Prescriptions     Medication Sig Dispense Auth. Provider   fluconazole (DIFLUCAN) 150 MG tablet Take one tablet today and one tablet in 3 days if symptoms persist. 2 tablet Wynonia Lawman A, NP      PDMP not reviewed this encounter.   Wynonia Lawman A, NP 01/29/24 1042

## 2024-01-29 NOTE — ED Triage Notes (Signed)
 Patient reports that she has had a white vaginal discharge x 3 weeks. Patient denies any dysuria or urinary frequency.

## 2024-01-30 LAB — RPR: RPR Ser Ql: NONREACTIVE

## 2024-02-01 ENCOUNTER — Telehealth (HOSPITAL_COMMUNITY): Payer: Self-pay

## 2024-02-01 LAB — CERVICOVAGINAL ANCILLARY ONLY
Bacterial Vaginitis (gardnerella): POSITIVE — AB
Candida Glabrata: NEGATIVE
Candida Vaginitis: NEGATIVE
Chlamydia: NEGATIVE
Comment: NEGATIVE
Comment: NEGATIVE
Comment: NEGATIVE
Comment: NEGATIVE
Comment: NEGATIVE
Comment: NORMAL
Neisseria Gonorrhea: NEGATIVE
Trichomonas: NEGATIVE

## 2024-02-01 MED ORDER — METRONIDAZOLE 500 MG PO TABS: 500.0000 mg | ORAL_TABLET | Freq: Two times a day (BID) | ORAL | 0 refills | Status: AC

## 2024-02-01 NOTE — Telephone Encounter (Signed)
 Per protocol, pt requires tx with metronidazole. Rx sent to pharmacy on file.

## 2024-07-05 ENCOUNTER — Encounter (HOSPITAL_COMMUNITY): Payer: Self-pay

## 2024-07-05 ENCOUNTER — Emergency Department (HOSPITAL_COMMUNITY): Payer: Self-pay

## 2024-07-05 ENCOUNTER — Inpatient Hospital Stay (HOSPITAL_COMMUNITY)
Admission: EM | Admit: 2024-07-05 | Discharge: 2024-07-13 | DRG: 064 | Disposition: A | Discharge status: Rehabilitation Facility | Payer: MEDICAID | Attending: Family Medicine | Admitting: Family Medicine

## 2024-07-05 DIAGNOSIS — I63443 Cerebral infarction due to embolism of bilateral cerebellar arteries: Principal | ICD-10-CM | POA: Diagnosis present

## 2024-07-05 DIAGNOSIS — F410 Panic disorder [episodic paroxysmal anxiety] without agoraphobia: Secondary | ICD-10-CM | POA: Diagnosis present

## 2024-07-05 DIAGNOSIS — Z5900 Homelessness unspecified: Secondary | ICD-10-CM

## 2024-07-05 DIAGNOSIS — E876 Hypokalemia: Secondary | ICD-10-CM | POA: Diagnosis present

## 2024-07-05 DIAGNOSIS — Z793 Long term (current) use of hormonal contraceptives: Secondary | ICD-10-CM

## 2024-07-05 DIAGNOSIS — Z91419 Personal history of unspecified adult abuse: Secondary | ICD-10-CM | POA: Diagnosis not present

## 2024-07-05 DIAGNOSIS — N179 Acute kidney failure, unspecified: Secondary | ICD-10-CM | POA: Diagnosis present

## 2024-07-05 DIAGNOSIS — G4710 Hypersomnia, unspecified: Secondary | ICD-10-CM | POA: Diagnosis present

## 2024-07-05 DIAGNOSIS — F332 Major depressive disorder, recurrent severe without psychotic features: Secondary | ICD-10-CM | POA: Diagnosis present

## 2024-07-05 DIAGNOSIS — D696 Thrombocytopenia, unspecified: Secondary | ICD-10-CM | POA: Diagnosis present

## 2024-07-05 DIAGNOSIS — Z8249 Family history of ischemic heart disease and other diseases of the circulatory system: Secondary | ICD-10-CM | POA: Diagnosis not present

## 2024-07-05 DIAGNOSIS — D509 Iron deficiency anemia, unspecified: Secondary | ICD-10-CM | POA: Diagnosis present

## 2024-07-05 DIAGNOSIS — E785 Hyperlipidemia, unspecified: Secondary | ICD-10-CM | POA: Diagnosis present

## 2024-07-05 DIAGNOSIS — E872 Acidosis, unspecified: Secondary | ICD-10-CM | POA: Diagnosis present

## 2024-07-05 DIAGNOSIS — R569 Unspecified convulsions: Secondary | ICD-10-CM | POA: Diagnosis present

## 2024-07-05 DIAGNOSIS — F909 Attention-deficit hyperactivity disorder, unspecified type: Secondary | ICD-10-CM | POA: Diagnosis present

## 2024-07-05 DIAGNOSIS — I7774 Dissection of vertebral artery: Secondary | ICD-10-CM | POA: Diagnosis present

## 2024-07-05 DIAGNOSIS — R29704 NIHSS score 4: Secondary | ICD-10-CM | POA: Diagnosis present

## 2024-07-05 DIAGNOSIS — R1310 Dysphagia, unspecified: Secondary | ICD-10-CM | POA: Diagnosis not present

## 2024-07-05 DIAGNOSIS — R7401 Elevation of levels of liver transaminase levels: Secondary | ICD-10-CM | POA: Diagnosis present

## 2024-07-05 DIAGNOSIS — Q2112 Patent foramen ovale: Secondary | ICD-10-CM | POA: Diagnosis not present

## 2024-07-05 DIAGNOSIS — I612 Nontraumatic intracerebral hemorrhage in hemisphere, unspecified: Secondary | ICD-10-CM | POA: Diagnosis present

## 2024-07-05 DIAGNOSIS — I252 Old myocardial infarction: Secondary | ICD-10-CM | POA: Diagnosis not present

## 2024-07-05 DIAGNOSIS — Z634 Disappearance and death of family member: Secondary | ICD-10-CM

## 2024-07-05 DIAGNOSIS — I63212 Cerebral infarction due to unspecified occlusion or stenosis of left vertebral arteries: Secondary | ICD-10-CM

## 2024-07-05 DIAGNOSIS — F331 Major depressive disorder, recurrent, moderate: Secondary | ICD-10-CM | POA: Diagnosis present

## 2024-07-05 DIAGNOSIS — H5500 Unspecified nystagmus: Secondary | ICD-10-CM | POA: Diagnosis present

## 2024-07-05 DIAGNOSIS — Z3046 Encounter for surveillance of implantable subdermal contraceptive: Secondary | ICD-10-CM

## 2024-07-05 DIAGNOSIS — I472 Ventricular tachycardia, unspecified: Secondary | ICD-10-CM | POA: Diagnosis present

## 2024-07-05 DIAGNOSIS — D72829 Elevated white blood cell count, unspecified: Secondary | ICD-10-CM | POA: Diagnosis present

## 2024-07-05 DIAGNOSIS — Z79899 Other long term (current) drug therapy: Secondary | ICD-10-CM

## 2024-07-05 DIAGNOSIS — R270 Ataxia, unspecified: Secondary | ICD-10-CM | POA: Diagnosis present

## 2024-07-05 DIAGNOSIS — Z7902 Long term (current) use of antithrombotics/antiplatelets: Secondary | ICD-10-CM

## 2024-07-05 DIAGNOSIS — I639 Cerebral infarction, unspecified: Secondary | ICD-10-CM

## 2024-07-05 DIAGNOSIS — Z7982 Long term (current) use of aspirin: Secondary | ICD-10-CM | POA: Diagnosis not present

## 2024-07-05 DIAGNOSIS — R471 Dysarthria and anarthria: Secondary | ICD-10-CM | POA: Diagnosis present

## 2024-07-05 DIAGNOSIS — I63012 Cerebral infarction due to thrombosis of left vertebral artery: Secondary | ICD-10-CM | POA: Diagnosis present

## 2024-07-05 DIAGNOSIS — Z833 Family history of diabetes mellitus: Secondary | ICD-10-CM

## 2024-07-05 HISTORY — DX: Cerebral infarction, unspecified: I63.9

## 2024-07-05 LAB — COMPREHENSIVE METABOLIC PANEL WITH GFR
ALT: 58 U/L — ABNORMAL HIGH (ref 0–44)
AST: 38 U/L (ref 15–41)
Albumin: 4.3 g/dL (ref 3.5–5.0)
Alkaline Phosphatase: 78 U/L (ref 38–126)
Anion gap: 14 (ref 5–15)
BUN: 9 mg/dL (ref 6–20)
CO2: 21 mmol/L — ABNORMAL LOW (ref 22–32)
Calcium: 9 mg/dL (ref 8.9–10.3)
Chloride: 107 mmol/L (ref 98–111)
Creatinine, Ser: 0.79 mg/dL (ref 0.44–1.00)
GFR, Estimated: >60 mL/min (ref >60)
Glucose, Bld: 123 mg/dL — ABNORMAL HIGH (ref 70–99)
Potassium: 3.2 mmol/L — ABNORMAL LOW (ref 3.5–5.1)
Sodium: 142 mmol/L (ref 135–145)
Total Bilirubin: 0.5 mg/dL (ref 0.0–1.2)
Total Protein: 7.1 g/dL (ref 6.5–8.1)

## 2024-07-05 LAB — CBC WITH DIFFERENTIAL/PLATELET
Abs Immature Granulocytes: 0.02 K/uL (ref 0.00–0.07)
Basophils Absolute: 0.1 K/uL (ref 0.0–0.1)
Basophils Relative: 1 %
Eosinophils Absolute: 0.1 K/uL (ref 0.0–0.5)
Eosinophils Relative: 1 %
HCT: 40.1 % (ref 36.0–46.0)
Hemoglobin: 13.3 g/dL (ref 12.0–15.0)
Immature Granulocytes: 0 %
Lymphocytes Relative: 20 %
Lymphs Abs: 1.5 K/uL (ref 0.7–4.0)
MCH: 25.8 pg — ABNORMAL LOW (ref 26.0–34.0)
MCHC: 33.2 g/dL (ref 30.0–36.0)
MCV: 77.7 fL — ABNORMAL LOW (ref 80.0–100.0)
Monocytes Absolute: 0.8 K/uL (ref 0.1–1.0)
Monocytes Relative: 10 %
Neutro Abs: 5.2 K/uL (ref 1.7–7.7)
Neutrophils Relative %: 68 %
Platelets: 138 K/uL — ABNORMAL LOW (ref 150–400)
RBC: 5.16 MIL/uL — ABNORMAL HIGH (ref 3.87–5.11)
RDW: 14.7 % (ref 11.5–15.5)
WBC: 7.6 K/uL (ref 4.0–10.5)
nRBC: 0 % (ref 0.0–0.2)

## 2024-07-05 LAB — URINE DRUG SCREEN
Amphetamines: NEGATIVE
Barbiturates: NEGATIVE
Benzodiazepines: NEGATIVE
Cocaine: NEGATIVE
Fentanyl: NEGATIVE
Methadone Scn, Ur: NEGATIVE
Opiates: NEGATIVE
Tetrahydrocannabinol: NEGATIVE

## 2024-07-05 LAB — HCG, SERUM, QUALITATIVE: Preg, Serum: NEGATIVE

## 2024-07-05 LAB — ETHANOL: Alcohol, Ethyl (B): <15 mg/dL (ref <15)

## 2024-07-05 LAB — CBG MONITORING, ED: Glucose-Capillary: 97 mg/dL (ref 70–99)

## 2024-07-05 LAB — SALICYLATE LEVEL: Salicylate Lvl: <7 mg/dL — ABNORMAL LOW (ref 7.0–30.0)

## 2024-07-05 LAB — ACETAMINOPHEN LEVEL: Acetaminophen (Tylenol), Serum: <10 ug/mL — ABNORMAL LOW (ref 10–30)

## 2024-07-05 MED ORDER — ASPIRIN 325 MG PO TBEC
325.0000 mg | DELAYED_RELEASE_TABLET | Freq: Once | ORAL | Status: DC
  Filled 2024-07-05: qty 1

## 2024-07-05 MED ORDER — IOHEXOL 350 MG/ML SOLN
100.0000 mL | Freq: Once | INTRAVENOUS | Status: AC | PRN
  Administered 2024-07-05: 100 mL via INTRAVENOUS

## 2024-07-05 MED ORDER — THIAMINE HCL 100 MG/ML IJ SOLN
100.0000 mg | Freq: Every day | INTRAMUSCULAR | Status: DC
  Administered 2024-07-06 – 2024-07-08 (×4): 100 mg via INTRAVENOUS
  Filled 2024-07-05 (×3): qty 2

## 2024-07-05 MED ORDER — ONDANSETRON HCL 4 MG/2ML IJ SOLN
4.0000 mg | Freq: Four times a day (QID) | INTRAMUSCULAR | Status: DC | PRN
  Administered 2024-07-06 – 2024-07-10 (×2): 4 mg via INTRAVENOUS
  Filled 2024-07-05 (×2): qty 2

## 2024-07-05 MED ORDER — ASPIRIN 81 MG PO TBEC
81.0000 mg | DELAYED_RELEASE_TABLET | Freq: Every day | ORAL | Status: DC
  Administered 2024-07-06 – 2024-07-13 (×8): 81 mg via ORAL
  Filled 2024-07-05 (×8): qty 1

## 2024-07-05 NOTE — Subjective & Objective (Signed)
 Patient has been having intermittent headaches At 3:30 pm she went un responsive was shaking all over was not following commands not answering questions  Tylenol  levels and  salicylate levels negative  Etoh negative Appeared ataxic CTA showed vertebral artery occlusion Neuro was consulted patient will need stroke work up

## 2024-07-05 NOTE — ED Notes (Signed)
Pt's friend at bedside

## 2024-07-05 NOTE — ED Notes (Signed)
 Pt vomited large amount of undigested food in the floor.  Admitting MD saw pt.

## 2024-07-05 NOTE — ED Notes (Signed)
 Pt is sleeping

## 2024-07-05 NOTE — ED Triage Notes (Signed)
 Pt BIB ems for seizure activity stated by her roommate. Pt described as overly emotional, was rolling around and throwing her hands around, keeps writing in a diary she has. Did not hit her head, no oral injury, no loss of urine. A&Ox4. VS stable

## 2024-07-05 NOTE — H&P (Addendum)
 Cassidy Bruce FMW:[id number] DOB: May 20, 1996 DOA: 07/05/2024     PCP: Pcp, No     Patient arrived to ER on 07/05/24 at 1616 Referred by Attending Neysa Caron PARAS, DO   Patient coming from:    home Lives   With roomate    Chief Complaint:   Chief Complaint  Patient presents with   Seizures    HPI: Cassidy Bruce is a 28 y.o. female with medical history significant of depression    Presented with  seizure like activity Patient has been having intermittent headaches At 3:30 pm she went un responsive was shaking all over was not following commands not answering questions  Tylenol  levels and  salicylate levels negative  Etoh negative Appeared ataxic CTA showed vertebral artery occlusion Neuro was consulted patient will need stroke work up       Patient reported she was eating  spaghetti when she had severe headache and vertigo Associated with Nausea now vomiting slurred speech, incoordination, imbalance.  Code stroke was activated  in ER  Denies significant ETOH intake   Does not smoke   Denies marijuana use         While in ER: Clinical Course as of 07/05/24 2203  Tue Jul 05, 2024  1639 EMS called out for altered mental status/seizure-like activity.  On their arrival patient similar following commands and rolling around seemingly volitionally.  Patient is somnolent, but will tell me her name.  Will not provide meaningful history.  Denies drug or alcohol use [TY]  1643 EMS received phone call from patient's roommate who found an empty bottle of Tylenol .  Unclear how much medication presented.  When asked patient states she took 2 but she does not appear to be reliable historian and appears intoxicated currently [TY]  1716 CBC with Differential(!) No leukocytosis to suggest systemic infection.  No anemia [TY]  1918 Was able to get a bottle of Tylenol  but patient reportedly took.  It is a bottle of 40 pills 500 mg.  There are 34 pills left in the bottle.  Tylenol  level  undetectable currently.  Unlikely toxic ingestion. Patient also notes that she has only taken 2. [TY]  1919 CT Head Wo Contrast IMPRESSION: 1. No acute intracranial abnormality. 2. New right cerebellar infarct since the prior study (2020), but likely chronic.  Electronically signed by: Franky Stanford MD 07/05/2024 06:49 PM EDT RP Workstation: [id number]   [TY]  1959 Spoke with Dr. Deedra with neurology recommending activating code stroke for teleneurology evaluation. [TY]  2110 CT ANGIO HEAD NECK W WO CM W PERF (CODE STROKE) IMPRESSION: 1. Proximal left vertebral artery dissection. Distal v2 segment reconstitution but with attenuated enhancement relative to the contralateral side along the remainder of its course. 2. A 13 mL region of ischemia affecting both cerebellar hemispheres, worse on the left, with no core infarct.   [TY]    Clinical Course User Index [TY] Neysa Caron PARAS, DO       Lab Orders         CBC with Differential         Comprehensive metabolic panel         hCG, serum, qualitative         Ethanol         Urine Drug Screen         Acetaminophen  level         Salicylate level         POC CBG, ED  CT HEAD No acute intracranial abnormality. 2. New right cerebellar infarct since the prior study    CTA - Proximal left vertebral artery dissection. Distal v2 segment reconstitution but with attenuated enhancement relative to the contralateral side along the remainder of its course.   13 mL region of ischemia affecting both cerebellar hemispheres, worse on the left, with no core infarct. CXR -  NON acute    Following Medications were ordered in ER: Medications  iohexol  (OMNIPAQUE ) 350 MG/ML injection 100 mL (100 mLs Intravenous Contrast Given 07/05/24 2032)    _______________________________________________________ ER Provider Called:      Cardiology  Dr. Raford They Recommend admit to medicine   Will see in AM        ED Triage Vitals  Encounter  Vitals Group     BP 07/05/24 1650 133/83     Girls Systolic BP Percentile --      Girls Diastolic BP Percentile --      Boys Systolic BP Percentile --      Boys Diastolic BP Percentile --      Pulse Rate 07/05/24 1650 72     Resp 07/05/24 1650 18     Temp 07/05/24 1650 (!) 97.5 F (36.4 C)     Temp Source 07/05/24 1650 Oral     SpO2 07/05/24 1628 98 %     Weight --      Height --      Head Circumference --      Peak Flow --      Pain Score --      Pain Loc --      Pain Education --      Exclude from Growth Chart --   UFJK(75)@     _________________________________________ Significant initial  Findings: Abnormal Labs Reviewed  CBC WITH DIFFERENTIAL/PLATELET - Abnormal; Notable for the following components:      Result Value   RBC 5.16 (*)    MCV 77.7 (*)    MCH 25.8 (*)    Platelets 138 (*)    All other components within normal limits  COMPREHENSIVE METABOLIC PANEL WITH GFR - Abnormal; Notable for the following components:   Potassium 3.2 (*)    CO2 21 (*)    Glucose, Bld 123 (*)    ALT 58 (*)    All other components within normal limits  ACETAMINOPHEN  LEVEL - Abnormal; Notable for the following components:   Acetaminophen  (Tylenol ), Serum <10 (*)    All other components within normal limits  SALICYLATE LEVEL - Abnormal; Notable for the following components:   Salicylate Lvl <7.0 (*)    All other components within normal limits     ECG: Ordered Personally reviewed and interpreted by me showing: HR : 74 Rhythm: Sinus rhythm Probable left atrial enlargement QTC 444  The recent clinical data is shown below. Vitals:   07/05/24 2115 07/05/24 2130 07/05/24 2145 07/05/24 2154  BP: 120/78 122/73 122/80   Pulse: 78 76 78   Resp: 20 18 19    Temp:   99 F (37.2 C) 98.9 F (37.2 C)  TempSrc:   Oral Axillary  SpO2: 100% 100% 100%     WBC     Component Value Date/Time   WBC 7.6 07/05/2024 1653   LYMPHSABS 1.5 07/05/2024 1653   MONOABS 0.8 07/05/2024 1653    EOSABS 0.1 07/05/2024 1653   BASOSABS 0.1 07/05/2024 1653       UA    ordered  ___________________________________________________ Recent Labs  Lab 07/05/24 1653  NA 142  K 3.2*  CO2 21*  GLUCOSE 123*  BUN 9  CREATININE 0.79  CALCIUM  9.0    Cr    stable,    Lab Results  Component Value Date   CREATININE 0.79 07/05/2024   CREATININE 0.85 08/24/2019   CREATININE 0.61 09/06/2018    Recent Labs  Lab 07/05/24 1653  AST 38  ALT 58*  ALKPHOS 78  BILITOT 0.5  PROT 7.1  ALBUMIN 4.3   Lab Results  Component Value Date   CALCIUM  9.0 07/05/2024       Plt: Lab Results  Component Value Date   PLT 138 (L) 07/05/2024       Recent Labs  Lab 07/05/24 1653  WBC 7.6  NEUTROABS 5.2  HGB 13.3  HCT 40.1  MCV 77.7*  PLT 138*    HG/HCT  stable,     Component Value Date/Time   HGB 13.3 07/05/2024 1653   HCT 40.1 07/05/2024 1653   MCV 77.7 (L) 07/05/2024 1653       _______________________________________________ Hospitalist was called for admission for  cva   The following Work up has been ordered so far:  Orders Placed This Encounter  Procedures   CT Head Wo Contrast   DG Chest Portable 1 View   CT ANGIO HEAD NECK W WO CM W PERF (CODE STROKE)   MR BRAIN WO CONTRAST   CBC with Differential   Comprehensive metabolic panel   hCG, serum, qualitative   Ethanol   Urine Drug Screen   Acetaminophen  level   Salicylate level   Diet NPO time specified   Vital signs   ED Cardiac monitoring   NIH Stroke Scale   Swallow screen   Activate teleneurology   Initiate Carrier Fluid Protocol   If O2 sat <94% Administer O2 @ 2 Liters/Minute If O2 Sat < 94%, administer O2 at 2 liters/minute via nasal cannula.   Consult to neurology   Clerk to Activate Code Stroke   Consult to hospitalist   ED Pulse oximetry, continuous   POC CBG, ED   EKG 12-Lead   EKG 12-Lead   ED EKG   EKG 12-Lead   Saline lock IV   Seizure precautions     OTHER Significant initial   Findings:  labs showing:     DM  labs:  HbA1C: No results for input(s): HGBA1C in the last 8760 hours.     CBG (last 3)  Recent Labs    07/05/24 1705  GLUCAP 97          Cultures:    Component Value Date/Time   SDES THROAT 12/25/2023 1540   SPECREQUEST NONE 12/25/2023 1540   CULT  12/25/2023 1540    FEW STREPTOCOCCUS,BETA HEMOLYTIC NOT GROUP A Beta hemolytic streptococci are predictably susceptible to penicillin and other beta lactams. Susceptibility testing not routinely performed. Performed at [facility], [address] [place], [place] [address]    REPTSTATUS 12/27/2023 FINAL 12/25/2023 1540     Radiological Exams on Admission: CT ANGIO HEAD NECK W WO CM W PERF (CODE STROKE) Result Date: 07/05/2024 EXAM: CTA Head and Neck with Perfusion 07/05/2024 08:55:08 PM TECHNIQUE: CTA of the head and neck was performed with the administration of intravenous contrast. 3D postprocessing with multiplanar reconstructions and MIPs was performed to evaluate the vascular anatomy. Automated exposure control, iterative reconstruction, and/or weight based adjustment of the mA/kV was utilized to reduce the radiation dose to  as low as reasonably achievable. COMPARISON: 07/05/2024 head CT CLINICAL HISTORY: Neuro deficit, acute, stroke suspected; ataxia, slurred speech, new small cerebellar stroke on CTH. Triage notes: seizure activity stated by her roommate. Pt described as overly emotional, was rolling around and throwing her hands around, keeps writing in a diary she has. Did not hit her head, no oral injury, no loss of urine. A\T\Ox4. VS stable. FINDINGS: CTA NECK: AORTIC ARCH AND ARCH VESSELS: No dissection or arterial injury. No significant stenosis of the brachiocephalic or subclavian arteries. CERVICAL CAROTID ARTERIES: No dissection, arterial injury, or hemodynamically significant stenosis by NASCET criteria. CERVICAL VERTEBRAL ARTERIES: No opacification of the left vertebral artery  distal V1 and proximal V2 segments. There is reconstitution at the distal V2 segment but enhancement remains asymmetrically decreased throughout the remainder of the course of the vertebral artery. The right vertebral artery is normal. No proximal occlusion visualized of the inferior cerebellar arteries. LUNGS AND MEDIASTINUM: Unremarkable. SOFT TISSUES: No acute abnormality. BONES: No acute abnormality. CTA HEAD: ANTERIOR CIRCULATION: No significant stenosis of the internal carotid arteries. No significant stenosis of the anterior cerebral arteries. No significant stenosis of the middle cerebral arteries. No aneurysm. POSTERIOR CIRCULATION: No significant stenosis of the posterior cerebral arteries. No significant stenosis of the basilar artery. No significant stenosis of the vertebral arteries. No aneurysm. OTHER: No dural venous sinus thrombosis on this non-dedicated study. CT PERFUSION: Fusion imaging shows a 13 mL region of ischemia affecting both cerebellar hemispheres but worse on the left. No core infarct. EXAM QUALITY: Exam quality is adequate with diagnostic perfusion maps. No significant motion artifact. Appropriate arterial inflow and venous outflow curves. CORE INFARCT (CBF<30% volume): 0 mL TOTAL HYPOPERFUSION (Tmax>6s volume): 13 mL PENUMBRA: Mismatch volume: 13 mL Mismatch ratio: Not applicable Location: Cerebellar hemispheres, worse on the left. IMPRESSION: 1. Proximal left vertebral artery dissection. Distal v2 segment reconstitution but with attenuated enhancement relative to the contralateral side along the remainder of its course. 2. A 13 mL region of ischemia affecting both cerebellar hemispheres, worse on the left, with no core infarct. Findings discussed with Dr. Caron Salt at 09:05 pm on 07/05/2024 Electronically signed by: Franky Stanford MD 07/05/2024 09:07 PM EDT RP Workstation: [id number]   CT Head Wo Contrast Result Date: 07/05/2024 EXAM: CT HEAD WITHOUT CONTRAST 07/05/2024 06:33:51 PM  TECHNIQUE: CT of the head was performed without the administration of intravenous contrast. Automated exposure control, iterative reconstruction, and/or weight based adjustment of the mA/kV was utilized to reduce the radiation dose to as low as reasonably achievable. COMPARISON: 08/24/2019 CLINICAL HISTORY: Seizure, new-onset, no history of trauma. Per chart: Pt BIB ems for seizure activity stated by her roommate. Pt described as overly emotional, was rolling around and throwing her hands around, keeps writing in a diary she has. Did not hit her head, no oral injury, no loss of urine. FINDINGS: BRAIN AND VENTRICLES: No acute hemorrhage. No evidence of acute infarct. No hydrocephalus. No extra-axial collection. No mass effect or midline shift. Old right cerebellar infarct but new since the prior study. ORBITS: No acute abnormality. SINUSES: No acute abnormality. SOFT TISSUES AND SKULL: No acute soft tissue abnormality. No skull fracture. IMPRESSION: 1. No acute intracranial abnormality. 2. New right cerebellar infarct since the prior study (2020), but likely chronic. Electronically signed by: Franky Stanford MD 07/05/2024 06:49 PM EDT RP Workstation: [id number]   DG Chest Portable 1 View Result Date: 07/05/2024 EXAM: 1 VIEW XRAY OF THE CHEST 07/05/2024 05:36:00 PM COMPARISON: 12/31/2017 CLINICAL HISTORY: ams. Per  chart: Pt BIB ems for seizure activity stated by her roommate. Pt described as overly emotional, was rolling around and throwing her hands around, keeps writing in a diary she has. Did not hit her head, no oral injury, no loss of urine. A\T\Ox4. VS ; stable FINDINGS: LUNGS AND PLEURA: Low lung volumes. No focal pulmonary opacity. No pulmonary edema. No pleural effusion. No pneumothorax. HEART AND MEDIASTINUM: No acute abnormality of the cardiac and mediastinal silhouettes. BONES AND SOFT TISSUES: No acute osseous abnormality. IMPRESSION: 1. No acute process. 2. Low lung volumes. Electronically signed by:  Franky Stanford MD 07/05/2024 06:47 PM EDT RP Workstation: [id number]   _______________________________________________________________________________________________________ Latest  Blood pressure 122/80, pulse 78, temperature 98.9 F (37.2 C), temperature source Axillary, resp. rate 19, SpO2 100%.   Vitals  labs and radiology finding personally reviewed  Review of Systems:    Pertinent positives include:  headache, confusion   Constitutional:  No weight loss, night sweats, Fevers, chills, fatigue, weight loss  HEENT:  No headaches, Difficulty swallowing,Tooth/dental problems,Sore throat,  No sneezing, itching, ear ache, nasal congestion, post nasal drip,  Cardio-vascular:  No chest pain, Orthopnea, PND, anasarca, dizziness, palpitations.no Bilateral lower extremity swelling  GI:  No heartburn, indigestion, abdominal pain, nausea, vomiting, diarrhea, change in bowel habits, loss of appetite, melena, blood in stool, hematemesis Resp:  no shortness of breath at rest. No dyspnea on exertion, No excess mucus, no productive cough, No non-productive cough, No coughing up of blood.No change in color of mucus.No wheezing. Skin:  no rash or lesions. No jaundice GU:  no dysuria, change in color of urine, no urgency or frequency. No straining to urinate.  No flank pain.  Musculoskeletal:  No joint pain or no joint swelling. No decreased range of motion. No back pain.  Psych:  No change in mood or affect. No depression or anxiety. No memory loss.  Neuro: no localizing neurological complaints, no tingling, no weakness, no double vision, no gait abnormality, no slurred speech, no confusion  All systems reviewed and apart from HOPI all are negative _______________________________________________________________________________________________ Past Medical History:   Past Medical History:  Diagnosis Date   ADHD    Depression       History reviewed. No pertinent surgical history.  Social  History:  Ambulatory   independently    reports that she has never smoked. She has never used smokeless tobacco. She reports that she does not drink alcohol and does not use drugs.   Family History:   Family History  Problem Relation Age of Onset   Cancer Other    Diabetes Other    CAD Other    Healthy Mother    ______________________________________________________________________________________________ Allergies: No Known Allergies   Prior to Admission medications   Medication Sig Start Date End Date Taking? Authorizing Provider  fluconazole  (DIFLUCAN ) 150 MG tablet Take one tablet today and one tablet in 3 days if symptoms persist. 01/29/24   Johnie Flaming A, NP    ___________________________________________________________________________________________________ Physical Exam:    07/05/2024    9:45 PM 07/05/2024    9:30 PM 07/05/2024    9:15 PM  Vitals with BMI  Systolic 122 122 879  Diastolic 80 73 78  Pulse 78 76 78     1. General:  in No  Acute distress   Chronically ill  -appearing 2. Psychological: Alert and   Oriented 3. Head/ENT:    Dry Mucous Membranes  Head Non traumatic, neck supple                           Poor Dentition 4. SKIN:  decreased Skin turgor,  Skin clean Dry and intact no rash    5. Heart: Regular rate and rhythm no  Murmur, no Rub or gallop 6. Lungs: , no wheezes or crackles   7. Abdomen: Soft,  non-tender, Non distended   obese  bowel sounds present 8. Lower extremities: no clubbing, cyanosis, no  edema 9. Neurologically Grossly intact, moving all 4 extremities equally  non cooperative with exam  10. MSK: Normal range of motion    Chart has been reviewed  ______________________________________________________________________________________________  Assessment/Plan 28 y.o. female with medical history significant of depression  Admitted for cVA   Present on Admission:  Stroke [facility])  Hypokalemia     Stroke [facility])  - will admit based on TIA/CVA protocol,        Monitor on Tele Patient is not a candidate for Thrombolytic.        MRA/MRI await  results  acute ischemic CVA        CTA  demonstrates Left vertebral artery occlusion at the level of C5 with distal reconstitution which is likely retrograde filling, the occlusion is age indeterminate but could be acute. The Right vertebral artery and basilar artery are grossly patient so attempted thrombectomy to the left vert would be more risk than reward at this time.        Echo to evaluate for possible embolic source,        obtain cardiac enzymes,  ECG,   Lipid panel, TSH.        Order PT/OT evaluation.        keep nothing by mouth until passes swallow eval  will need Speech pathology evaluation       Will make sure patient is on antiplatelet ASA 81    and statin if LDL >70       Allow permissive Hypertension keep BP <220/120        Neurology consulted   see on arrival   Hypokalemia - will replace electrolytes and repeat  check Mg, phos and Ca level and replace as needed Monitor on telemetry   Lab Results  Component Value Date   K 3.2 (L) 07/05/2024     Lab Results  Component Value Date   CREATININE 0.79 07/05/2024   No results found for: MG Lab Results  Component Value Date   CALCIUM  9.0 07/05/2024      Other plan as per orders.  DVT prophylaxis:  SCD    Code Status:    Code Status: Prior FULL CODE    Family Communication:   Family not at  Bedside    Diet  Diet Orders (From admission, onward)     Start     Ordered   07/05/24 2001  Diet NPO time specified  Diet effective now       Comments: NPO until stroke swallow screen is complete   07/05/24 2001            Disposition Plan:     likely will need placement for rehabilitation                             Following barriers for discharge:  Stroke   work up is complete                              Consult Orders  (From  admission, onward)           Start     Ordered   07/05/24 2154  Consult to hospitalist  Once       Provider:  (Not yet assigned)  Question Answer Comment  Place call to: [facility]   Reason for Consult Admit      07/05/24 2153                               Would benefit from PT/OT eval prior to DC  Ordered                   Swallow eval - SLP ordered                     Consults called: neurology    Admission status:  ED Disposition     ED Disposition  Admit   Condition  --   Comment  Hospital Area: [facility] [[id number]]  Level of Care: Progressive [102]  Admit to Progressive based on following criteria: CARDIOVASCULAR & THORACIC of moderate stability with acute coronary syndrome symptoms/low risk myocardial infarction/hypertensive urgency/arrhythmias/heart failure potentially compromising stability and stable post cardiovascular intervention patients.  May admit patient to [facility] or [facility] if equivalent level of care is available:: No  Covid Evaluation: Asymptomatic - no recent exposure (last 10 days) testing not required  Diagnosis: Stroke [facility]) [[id number]]  Admitting Physician: Kaedance Magos [[id number]]  Attending Physician: Alejos Reinhardt [[id number]]  Certification:: I certify this patient will need inpatient services for at least 2 midnights  Expected Medical Readiness: 07/07/2024           inpatient     I Expect 2 midnight stay secondary to severity of patient's current illness need for inpatient interventions justified by the following:     Severe lab/radiological/exam abnormalities including:  CVA    I expect  patient to be hospitalized for 2 midnights requiring inpatient medical care.  Patient is at high risk for adverse outcome (such as loss of life or disability) if not treated.  Indication for inpatient stay as follows:   stroke    Level of care       progressive      Ceonna Frazzini 07/06/2024, 12:00 AM     [facility]     after 2 AM please page floor coverage   If 7AM-7PM, please contact the day team taking care of the patient using [email or web address]

## 2024-07-05 NOTE — ED Notes (Signed)
 Pt threw up behind the bed; cleaned and changed her into a gown.

## 2024-07-05 NOTE — ED Notes (Signed)
 Pt removed her own bedpan and set it above her in bed.  Pt became very restless again

## 2024-07-05 NOTE — ED Notes (Signed)
 Placed pt on bedpan and advised her that urine sample is needed

## 2024-07-05 NOTE — Consult Note (Signed)
 [facility] Tele[facility]rology Consult Services   Patient Name:   Cassidy Bruce, Cassidy Bruce Date of Birth:   02-Feb-1996 Identification Number:   MRN - [id number] Date of Service:   07/05/2024 20:02:50  Diagnosis:       I63.89 - Cerebrovascular accident (CVA) due to other mechanism (HCCC)       I63.012 - Cerebrovascular accident (CVA) due to thrombosis of left vertebral artery (HCCC)  Impression:      1. ataxia, scanning speech - suspect acute ischemic stroke, likely embolic  2. major depressive disorder  - no h/o blood clots  - patient says the symptoms began at 1500 today which is >4.5h so thrombolytic is contraindicated  - HCG serum negative ,cr wnl.  - there is also fluctuating attention  - acute ischemic stroke vs demyelinating disease vs other central process vs functional disorder  - CTP 13mL region of ischemia involving both cerebellar hemispheres without core infarct  - CTA demonstrates Left vertebral artery occlusion at the level of C5 with distal reconstitution which is likely retrograde filling, the occlusion is age indeterminate but could be acute. The Right vertebral artery and basilar artery are grossly patient so attempted thrombectomy to the left vert would be more risk than reward at this time.  - new small R cerebellar lesion compared to prior [facility], agree with radiology that this is concerning for possible chronic stroke, but it is of unclear chronicity and could have occurred within the last three months. She is not in the thrombolytic window, but even if she were the presence of that age indeterminate cerebellar lesion would make the thrombolytic high risk for this patient. Will need hematology consult for hypercoagulable work up.  Our recommendations are outlined below.  Recommendations:        Stroke/Telemetry Floor       Neuro Checks (Q2)       Bedside Swallow Eval       DVT Prophylaxis       IV Fluids, Normal Saline       Euglycemia and Avoid Hyperthermia  (PRN Acetaminophen )       Hold Anticoagulation for Now       Initiate or continue Aspirin  81 MG daily       Antihypertensives PRN if Blood pressure is greater than 220/120 or there is a concern for End organ damage/contraindications for permissive HTN. If blood pressure is greater than 220/120 give labetalol PO or IV or Vasotec IV with a goal of 15% reduction in BP during the first 24 hours.       routine MRI brain without contrast       echocardiogram w/bubble (if TTE negative then will need TEE)       a1c, lipid panel       PT/OT consult       Head of bed flat       fall precautions       hematology consult for hypercoagulable work up  Sign Out:       Discussed with Emergency Department Provider    ------------------------------------------------------------------------------  Advanced Imaging: CTA Head and Neck Completed.  CTP Completed.  LVO:Yes  Discussed with NIR :No   Metrics: Last Known Well: 07/05/2024 15:00:00 Dispatch Time: 07/05/2024 20:02:50 Arrival Time: 07/05/2024 16:17:00 Initial Response Time: 07/05/2024 20:09:33 Symptoms: dizziness, slurred speech, incoordination, headache. Initial patient interaction: 07/05/2024 20:13:02 NIHSS Assessment Completed: 07/05/2024 20:18:55 Patient is not a candidate for Thrombolytic. Thrombolytic Medical Decision: 07/05/2024 20:18:55 Patient was not deemed candidate for Thrombolytic because of  following reasons: LKW outside 4.5 hr window. .  CT Head: CT head unremarkable for acute infarction or hemorrhage per Radiology: I personally reviewed all the CT images that were available to me and it showed no acute hemorrhage or core infarct. New cerebellar lesion compared to prior.  Primary Provider Notified of Diagnostic Impression and Management Plan on: 07/05/2024 21:05:00    ------------------------------------------------------------------------------  History of Present Illness: Patient is a 28 year old  Female.  Patient was brought by EMS for symptoms of dizziness, slurred speech, incoordination, headache. Cassidy Bruce 27yoF pmh MDD, adjustment disorder, attention deficit disorder.  I was eating spaghetti when my head started hurting and the room started spinning. And then I started crying a little.  Says her headache is all over. No light sensitivity. A little nausea. Associated with slurred speech, incoordination, imbalance. Patient repeatedly asking if she can go home. Asked patient multiple times to confirm when the symptoms began, each time she said they began at 1500 today.    Past Medical History:      There is no history of Hypertension      There is no history of Diabetes Mellitus      There is no history of Hyperlipidemia      There is no history of Atrial Fibrillation      There is no history of Coronary Artery Disease      There is no history of Stroke      There is no history of Seizures  Medications:  No Anticoagulant use  No Antiplatelet use Reviewed EMR for current medications  Allergies:  Reviewed  Social History: Smoking: No Alcohol Use: No Drug Use: No  Family History:  There is no family history of premature cerebrovascular disease pertinent to this consultation  ROS : 14 Points Review of Systems was performed and was negative except mentioned in HPI.  Past Surgical History: There Is No Surgical History Contributory To Today's Visit    Examination: BP(114/102), Pulse(77), Blood Glucose(123) 1A: Level of Consciousness - Alert; keenly responsive + 0 1B: Ask Month and Age - Both Questions Right + 0 1C: Blink Eyes & Squeeze Hands - Performs Both Tasks + 0 2: Test Horizontal Extraocular Movements - Normal + 0 3: Test Visual Fields - No Visual Loss + 0 4: Test Facial Palsy (Use Grimace if Obtunded) - Normal symmetry + 0 5A: Test Left Arm Motor Drift - No Drift for 10 Seconds + 0 5B: Test Right Arm Motor Drift - No Drift for 10 Seconds  + 0 6A: Test Left Leg Motor Drift - No Drift for 5 Seconds + 0 6B: Test Right Leg Motor Drift - No Drift for 5 Seconds + 0 7: Test Limb Ataxia (FNF/Heel-Shin) - Ataxia in 2 Limbs + 2 8: Test Sensation - Normal; No sensory loss + 0 9: Test Language/Aphasia - Normal; No aphasia + 0 10: Test Dysarthria - Severe Dysarthria: Unintelligble Slurring or Out of Proportion to Aphasia + 2 11: Test Extinction/Inattention - No abnormality + 0  NIHSS Score: 4  NIHSS Free Text : truncal ataxia. scanning speech. dysmetria b/l UE on FTN. ataxia in all four extremities, fluctuating, more prominent in Right arm/leg.  Pre-Morbid Modified Rankin Scale: 0 Points = No symptoms at all  Spoke with : Dr Neysa I reviewed the available imaging via Rapid and initiated discussion with the primary provider  This consult was conducted in real time using interactive audio and video technology. Patient was informed of the technology  being used for this visit and agreed to proceed. Patient located in hospital and provider located at home/office setting.   Patient is being evaluated for possible acute neurologic impairment and high probability of imminent or life-threatening deterioration. I spent total of 22 minutes providing care to this patient, including time for face to face visit via telemedicine, review of medical records, imaging studies and discussion of findings with providers, the patient and/or family.    Dr Lamar Senters   [facility] For Inpatient follow-up with [facility] physician please call [facility] at [phone]. As we are not an outpatient service for any post hospital discharge needs please contact the hospital for assistance. If you have any questions for the [facility] physicians or need to reconsult for clinical or diagnostic changes please contact us  via [facility] at [phone].   Signature : Lamar Senters

## 2024-07-05 NOTE — ED Notes (Signed)
 EDP is examining pt.  Visitor at the bedside.

## 2024-07-05 NOTE — ED Notes (Signed)
 Pt friend Zenobia is at the bedside and states that pt has been having mental health issues that regularly alter her behavior and when asked what she means she states that pt has been moody and at times just starts crying.  She states that pt has made suicide attempts in the past.  Visitor states that pt was dating a man and he took out a restraining order against pt which she then violated which is causing her legal issues. As visitor is describing this pt begins having increased movement of all four extremities including sitting up and leaning over the bed rail (thrusting her torso over bedrail).  Nursing staff helped pt to lay back down and covered her.  Pt became still and calm after visitor leaves.  Will continue to monitor closely for safety.

## 2024-07-05 NOTE — Assessment & Plan Note (Signed)
-   will admit based on TIA/CVA protocol,        Monitor on Tele Patient is not a candidate for Thrombolytic.        MRA/MRI await  results  acute ischemic CVA        CTA  demonstrates Left vertebral artery occlusion at the level of C5 with distal reconstitution which is likely retrograde filling, the occlusion is age indeterminate but could be acute. The Right vertebral artery and basilar artery are grossly patient so attempted thrombectomy to the left vert would be more risk than reward at this time.        Echo to evaluate for possible embolic source,        obtain cardiac enzymes,  ECG,   Lipid panel, TSH.        Order PT/OT evaluation.        keep nothing by mouth until passes swallow eval  will need Speech pathology evaluation       Will make sure patient is on antiplatelet ASA 81    and statin if LDL >70       Allow permissive Hypertension keep BP <220/120        Neurology consulted   see on arrival

## 2024-07-05 NOTE — ED Provider Notes (Signed)
 Cassidy Bruce EMERGENCY DEPARTMENT AT [facility] Provider Note   CSN: [id number] Arrival date & time: 07/05/24  1616     Patient presents with: Seizures   Cassidy Bruce is a 28 y.o. female.  {Add pertinent medical, surgical, social history, OB history to HPI:32947} This is a 28 year old female   Seizures      Prior to Admission medications   Medication Sig Start Date End Date Taking? Authorizing Provider  fluconazole  (DIFLUCAN ) 150 MG tablet Take one tablet today and one tablet in 3 days if symptoms persist. 01/29/24   Johnie Rumaldo LABOR, NP    Allergies: Patient has no known allergies.    Review of Systems  Neurological:  Positive for seizures.    Updated Vital Signs BP 106/68   Pulse 77   Temp 97.6 F (36.4 C) (Oral)   Resp 19   SpO2 100%   Physical Exam  (all labs ordered are listed, but only abnormal results are displayed) Labs Reviewed  CBC WITH DIFFERENTIAL/PLATELET - Abnormal; Notable for the following components:      Result Value   RBC 5.16 (*)    MCV 77.7 (*)    MCH 25.8 (*)    Platelets 138 (*)    All other components within normal limits  COMPREHENSIVE METABOLIC PANEL WITH GFR - Abnormal; Notable for the following components:   Potassium 3.2 (*)    CO2 21 (*)    Glucose, Bld 123 (*)    ALT 58 (*)    All other components within normal limits  ACETAMINOPHEN  LEVEL - Abnormal; Notable for the following components:   Acetaminophen  (Tylenol ), Serum <10 (*)    All other components within normal limits  SALICYLATE LEVEL - Abnormal; Notable for the following components:   Salicylate Lvl <7.0 (*)    All other components within normal limits  HCG, SERUM, QUALITATIVE  ETHANOL  URINE DRUG SCREEN  CBG MONITORING, ED    EKG: EKG Interpretation Date/Time:  Tuesday July 05 2024 20:09:18 EDT Ventricular Rate:  74 PR Interval:  170 QRS Duration:  90 QT Interval:  400 QTC Calculation: 444 R Axis:   59  Text Interpretation: Sinus  rhythm Probable left atrial enlargement Confirmed by Neysa Clap [phone]) on 07/05/2024 9:12:15 PM  Radiology: CT ANGIO HEAD NECK W WO CM W PERF (CODE STROKE) Result Date: 07/05/2024 EXAM: CTA Head and Neck with Perfusion 07/05/2024 08:55:08 PM TECHNIQUE: CTA of the head and neck was performed with the administration of intravenous contrast. 3D postprocessing with multiplanar reconstructions and MIPs was performed to evaluate the vascular anatomy. Automated exposure control, iterative reconstruction, and/or weight based adjustment of the mA/kV was utilized to reduce the radiation dose to as low as reasonably achievable. COMPARISON: 07/05/2024 head CT CLINICAL HISTORY: Neuro deficit, acute, stroke suspected; ataxia, slurred speech, new small cerebellar stroke on CTH. Triage notes: seizure activity stated by her roommate. Pt described as overly emotional, was rolling around and throwing her hands around, keeps writing in a diary she has. Did not hit her head, no oral injury, no loss of urine. A\T\Ox4. VS stable. FINDINGS: CTA NECK: AORTIC ARCH AND ARCH VESSELS: No dissection or arterial injury. No significant stenosis of the brachiocephalic or subclavian arteries. CERVICAL CAROTID ARTERIES: No dissection, arterial injury, or hemodynamically significant stenosis by NASCET criteria. CERVICAL VERTEBRAL ARTERIES: No opacification of the left vertebral artery distal V1 and proximal V2 segments. There is reconstitution at the distal V2 segment but enhancement remains asymmetrically decreased throughout the remainder of  the course of the vertebral artery. The right vertebral artery is normal. No proximal occlusion visualized of the inferior cerebellar arteries. LUNGS AND MEDIASTINUM: Unremarkable. SOFT TISSUES: No acute abnormality. BONES: No acute abnormality. CTA HEAD: ANTERIOR CIRCULATION: No significant stenosis of the internal carotid arteries. No significant stenosis of the anterior cerebral arteries. No significant  stenosis of the middle cerebral arteries. No aneurysm. POSTERIOR CIRCULATION: No significant stenosis of the posterior cerebral arteries. No significant stenosis of the basilar artery. No significant stenosis of the vertebral arteries. No aneurysm. OTHER: No dural venous sinus thrombosis on this non-dedicated study. CT PERFUSION: Fusion imaging shows a 13 mL region of ischemia affecting both cerebellar hemispheres but worse on the left. No core infarct. EXAM QUALITY: Exam quality is adequate with diagnostic perfusion maps. No significant motion artifact. Appropriate arterial inflow and venous outflow curves. CORE INFARCT (CBF<30% volume): 0 mL TOTAL HYPOPERFUSION (Tmax>6s volume): 13 mL PENUMBRA: Mismatch volume: 13 mL Mismatch ratio: Not applicable Location: Cerebellar hemispheres, worse on the left. IMPRESSION: 1. Proximal left vertebral artery dissection. Distal v2 segment reconstitution but with attenuated enhancement relative to the contralateral side along the remainder of its course. 2. A 13 mL region of ischemia affecting both cerebellar hemispheres, worse on the left, with no core infarct. Findings discussed with Dr. Caron Salt at 09:05 pm on 07/05/2024 Electronically signed by: Franky Stanford MD 07/05/2024 09:07 PM EDT RP Workstation: [id number]   CT Head Wo Contrast Result Date: 07/05/2024 EXAM: CT HEAD WITHOUT CONTRAST 07/05/2024 06:33:51 PM TECHNIQUE: CT of the head was performed without the administration of intravenous contrast. Automated exposure control, iterative reconstruction, and/or weight based adjustment of the mA/kV was utilized to reduce the radiation dose to as low as reasonably achievable. COMPARISON: 08/24/2019 CLINICAL HISTORY: Seizure, new-onset, no history of trauma. Per chart: Pt BIB ems for seizure activity stated by her roommate. Pt described as overly emotional, was rolling around and throwing her hands around, keeps writing in a diary she has. Did not hit her head, no oral  injury, no loss of urine. FINDINGS: BRAIN AND VENTRICLES: No acute hemorrhage. No evidence of acute infarct. No hydrocephalus. No extra-axial collection. No mass effect or midline shift. Old right cerebellar infarct but new since the prior study. ORBITS: No acute abnormality. SINUSES: No acute abnormality. SOFT TISSUES AND SKULL: No acute soft tissue abnormality. No skull fracture. IMPRESSION: 1. No acute intracranial abnormality. 2. New right cerebellar infarct since the prior study (2020), but likely chronic. Electronically signed by: Franky Stanford MD 07/05/2024 06:49 PM EDT RP Workstation: [id number]   DG Chest Portable 1 View Result Date: 07/05/2024 EXAM: 1 VIEW XRAY OF THE CHEST 07/05/2024 05:36:00 PM COMPARISON: 12/31/2017 CLINICAL HISTORY: ams. Per chart: Pt BIB ems for seizure activity stated by her roommate. Pt described as overly emotional, was rolling around and throwing her hands around, keeps writing in a diary she has. Did not hit her head, no oral injury, no loss of urine. A\T\Ox4. VS ; stable FINDINGS: LUNGS AND PLEURA: Low lung volumes. No focal pulmonary opacity. No pulmonary edema. No pleural effusion. No pneumothorax. HEART AND MEDIASTINUM: No acute abnormality of the cardiac and mediastinal silhouettes. BONES AND SOFT TISSUES: No acute osseous abnormality. IMPRESSION: 1. No acute process. 2. Low lung volumes. Electronically signed by: Franky Stanford MD 07/05/2024 06:47 PM EDT RP Workstation: [id number]    {Document cardiac monitor, telemetry assessment procedure when appropriate:32947} Procedures   Medications Ordered in the ED  iohexol  (OMNIPAQUE ) 350 MG/ML injection 100 mL (100 mLs  Intravenous Contrast Given 07/05/24 2032)    Clinical Course as of 07/05/24 2113  Tue Jul 05, 2024  1639 EMS called out for altered mental status/seizure-like activity.  On their arrival patient similar following commands and rolling around seemingly volitionally.  Patient is somnolent, but will tell me her  name.  Will not provide meaningful history.  Denies drug or alcohol use [TY]  1643 EMS received phone call from patient's roommate who found an empty bottle of Tylenol .  Unclear how much medication presented.  When asked patient states she took 2 but she does not appear to be reliable historian and appears intoxicated currently [TY]  1716 CBC with Differential(!) No leukocytosis to suggest systemic infection.  No anemia [TY]  1918 Was able to get a bottle of Tylenol  but patient reportedly took.  It is a bottle of 40 pills 500 mg.  There are 34 pills left in the bottle.  Tylenol  level undetectable currently.  Unlikely toxic ingestion. Patient also notes that she has only taken 2. [TY]  1919 CT Head Wo Contrast IMPRESSION: 1. No acute intracranial abnormality. 2. New right cerebellar infarct since the prior study (2020), but likely chronic.  Electronically signed by: Franky Stanford MD 07/05/2024 06:49 PM EDT RP Workstation: [id number]   [TY]  1959 Spoke with Dr. Deedra with neurology recommending activating code stroke for teleneurology evaluation. [TY]  2110 CT ANGIO HEAD NECK W WO CM W PERF (CODE STROKE) IMPRESSION: 1. Proximal left vertebral artery dissection. Distal v2 segment reconstitution but with attenuated enhancement relative to the contralateral side along the remainder of its course. 2. A 13 mL region of ischemia affecting both cerebellar hemispheres, worse on the left, with no core infarct.   [TY]    Clinical Course User Index [TY] Neysa Caron PARAS, DO   {Click here for ABCD2, HEART and other calculators REFRESH Note before signing:1}                              Medical Decision Making Amount and/or Complexity of Data Reviewed Labs: ordered. Decision-making details documented in ED Course. Radiology: ordered. Decision-making details documented in ED Course. ECG/medicine tests: ordered.  Risk Prescription drug management.   ***  {Document critical care time when  appropriate  Document review of labs and clinical decision tools ie CHADS2VASC2, etc  Document your independent review of radiology images and any outside records  Document your discussion with family members, caretakers and with consultants  Document social determinants of health affecting pt's care  Document your decision making why or why not admission, treatments were needed:32947:::1}   Final diagnoses:  None    ED Discharge Orders     None

## 2024-07-05 NOTE — ED Notes (Signed)
Tele neurology is evaluating pt.

## 2024-07-06 ENCOUNTER — Inpatient Hospital Stay (HOSPITAL_COMMUNITY): Payer: Self-pay

## 2024-07-06 ENCOUNTER — Other Ambulatory Visit (HOSPITAL_COMMUNITY): Payer: Self-pay

## 2024-07-06 DIAGNOSIS — Z7902 Long term (current) use of antithrombotics/antiplatelets: Secondary | ICD-10-CM

## 2024-07-06 DIAGNOSIS — Z7982 Long term (current) use of aspirin: Secondary | ICD-10-CM

## 2024-07-06 DIAGNOSIS — I6389 Other cerebral infarction: Secondary | ICD-10-CM

## 2024-07-06 DIAGNOSIS — R4781 Slurred speech: Secondary | ICD-10-CM

## 2024-07-06 DIAGNOSIS — I63543 Cerebral infarction due to unspecified occlusion or stenosis of bilateral cerebellar arteries: Secondary | ICD-10-CM

## 2024-07-06 DIAGNOSIS — E876 Hypokalemia: Secondary | ICD-10-CM | POA: Diagnosis present

## 2024-07-06 DIAGNOSIS — I7774 Dissection of vertebral artery: Secondary | ICD-10-CM

## 2024-07-06 DIAGNOSIS — R29898 Other symptoms and signs involving the musculoskeletal system: Secondary | ICD-10-CM

## 2024-07-06 LAB — CBC
HCT: 39.4 % (ref 36.0–46.0)
Hemoglobin: 12.8 g/dL (ref 12.0–15.0)
MCH: 25.3 pg — ABNORMAL LOW (ref 26.0–34.0)
MCHC: 32.5 g/dL (ref 30.0–36.0)
MCV: 77.9 fL — ABNORMAL LOW (ref 80.0–100.0)
Platelets: 125 K/uL — ABNORMAL LOW (ref 150–400)
RBC: 5.06 MIL/uL (ref 3.87–5.11)
RDW: 14.8 % (ref 11.5–15.5)
WBC: 12 K/uL — ABNORMAL HIGH (ref 4.0–10.5)
nRBC: 0 % (ref 0.0–0.2)

## 2024-07-06 LAB — ECHOCARDIOGRAM COMPLETE
Area-P 1/2: 3.68 cm2
MV M vel: 2.46 m/s
MV Peak grad: 24.2 mmHg
S' Lateral: 3.6 cm

## 2024-07-06 LAB — COMPREHENSIVE METABOLIC PANEL WITH GFR
ALT: 60 U/L — ABNORMAL HIGH (ref 0–44)
AST: 36 U/L (ref 15–41)
Albumin: 4.1 g/dL (ref 3.5–5.0)
Alkaline Phosphatase: 77 U/L (ref 38–126)
Anion gap: 12 (ref 5–15)
BUN: 8 mg/dL (ref 6–20)
CO2: 21 mmol/L — ABNORMAL LOW (ref 22–32)
Calcium: 9.2 mg/dL (ref 8.9–10.3)
Chloride: 106 mmol/L (ref 98–111)
Creatinine, Ser: 0.7 mg/dL (ref 0.44–1.00)
GFR, Estimated: >60 mL/min (ref >60)
Glucose, Bld: 90 mg/dL (ref 70–99)
Potassium: 4.6 mmol/L (ref 3.5–5.1)
Sodium: 139 mmol/L (ref 135–145)
Total Bilirubin: 0.4 mg/dL (ref 0.0–1.2)
Total Protein: 6.9 g/dL (ref 6.5–8.1)

## 2024-07-06 LAB — LIPID PANEL
Cholesterol: 179 mg/dL (ref 0–200)
HDL: 67 mg/dL (ref >40)
LDL Cholesterol: 104 mg/dL — ABNORMAL HIGH (ref 0–99)
Total CHOL/HDL Ratio: 2.7 ratio
Triglycerides: 42 mg/dL (ref <150)
VLDL: 8 mg/dL (ref 0–40)

## 2024-07-06 LAB — MAGNESIUM: Magnesium: 2.1 mg/dL (ref 1.7–2.4)

## 2024-07-06 LAB — HEMOGLOBIN A1C
Hgb A1c MFr Bld: 5.4 % (ref 4.8–5.6)
Mean Plasma Glucose: 108.28 mg/dL

## 2024-07-06 LAB — CK: Total CK: 284 U/L — ABNORMAL HIGH (ref 38–234)

## 2024-07-06 LAB — SEDIMENTATION RATE: Sed Rate: 4 mm/h (ref 0–22)

## 2024-07-06 LAB — LACTIC ACID, PLASMA: Lactic Acid, Venous: 1.1 mmol/L (ref 0.5–1.9)

## 2024-07-06 LAB — PHOSPHORUS: Phosphorus: 2.2 mg/dL — ABNORMAL LOW (ref 2.5–4.6)

## 2024-07-06 MED ORDER — BUTALBITAL-APAP-CAFFEINE 50-325-40 MG PO TABS
1.0000 | ORAL_TABLET | Freq: Three times a day (TID) | ORAL | Status: DC | PRN
  Administered 2024-07-06 – 2024-07-10 (×6): 1 via ORAL
  Filled 2024-07-06 (×7): qty 1

## 2024-07-06 MED ORDER — SODIUM CHLORIDE 0.9 % IV SOLN: INTRAVENOUS | Status: DC

## 2024-07-06 MED ORDER — ACETAMINOPHEN 325 MG PO TABS
650.0000 mg | ORAL_TABLET | Freq: Four times a day (QID) | ORAL | Status: DC | PRN
  Administered 2024-07-06 – 2024-07-07 (×2): 650 mg via ORAL
  Filled 2024-07-06 (×2): qty 2

## 2024-07-06 MED ORDER — POTASSIUM PHOSPHATES 15 MMOLE/5ML IV SOLN
15.0000 mmol | Freq: Once | INTRAVENOUS | Status: AC
  Administered 2024-07-06: 15 mmol via INTRAVENOUS
  Filled 2024-07-06: qty 5

## 2024-07-06 MED ORDER — POTASSIUM CHLORIDE 10 MEQ/100ML IV SOLN
10.0000 meq | INTRAVENOUS | Status: AC
  Administered 2024-07-06 (×4): 10 meq via INTRAVENOUS
  Filled 2024-07-06 (×4): qty 100

## 2024-07-06 MED ORDER — SODIUM CHLORIDE 0.9 % IV SOLN: Freq: Once | INTRAVENOUS | Status: AC

## 2024-07-06 MED ORDER — STROKE: EARLY STAGES OF RECOVERY BOOK
Freq: Once | Status: AC
  Filled 2024-07-06: qty 1

## 2024-07-06 MED ORDER — LORAZEPAM 2 MG/ML IJ SOLN
1.0000 mg | Freq: Once | INTRAMUSCULAR | Status: DC | PRN
  Filled 2024-07-06: qty 1

## 2024-07-06 MED ORDER — ROSUVASTATIN CALCIUM 20 MG PO TABS
20.0000 mg | ORAL_TABLET | Freq: Every day | ORAL | Status: DC
  Administered 2024-07-07 – 2024-07-13 (×7): 20 mg via ORAL
  Filled 2024-07-06 (×7): qty 1

## 2024-07-06 MED ORDER — CLOPIDOGREL BISULFATE 75 MG PO TABS
75.0000 mg | ORAL_TABLET | Freq: Every day | ORAL | Status: DC
  Administered 2024-07-07 – 2024-07-13 (×7): 75 mg via ORAL
  Filled 2024-07-06 (×7): qty 1

## 2024-07-06 NOTE — ED Notes (Signed)
 ADDING this to chart for pt.  Pt has a birth control implant in her arm and she has had it placed years ago and has tried to have it removed and can't afford the $400 cost.

## 2024-07-06 NOTE — Consult Note (Addendum)
 Stroke Neurology Consultation Note  Consult Requested by: Dr. Perri  Reason for Consult: stroke  Consult Date: 07/06/24   The history was obtained from the pt.  During history and examination, all items were able to obtain unless otherwise noted.  History of Present Illness:  Cassidy Bruce is a 28 y.o. African American female with PMH of MDD, ADHD, depression admitted for dizziness, slurry speech, headache and imbalance on walking.  Per patient, she went to gym on Monday, doing weightlifting as usual, no specific movements.  Tuesday morning she woke up from sleep, started to have headache and neck pain, tolerable.  In the afternoon she had a spaghetti for lunch and then went to have a nap, on waking up from nap, she had acute onset vertigo, room spinning, not able to open eyes, slurred speech, hard to speak and not able to walk.  EMS was called and she was sent to ER for evaluation.  CT concerning for right cerebellar infarct.  CTA head and neck also showed left VA proximal occlusion concerning for dissection.  CTP showed bilateral cerebellum penumbra 13 cc, left more than right, no core infarct.  Patient was admitted for further stroke workup. She stated that her speech and imbalance seemed better today but headache continued. MRI showed b/l cerebellar infarct, L>R.  She denies smoking, alcohol or illicit drugs. But uses birth control shot in arm for birth control, she can not tell me the name.   LSN: yesterday morning TNK Given: No: outside window IR Thrombectomy? No, left VA occlusion but BA patent Modified Rankin Scale: 0-Completely asymptomatic and back to baseline post- stroke  Past Medical History:  Diagnosis Date   ADHD    Depression     History reviewed. No pertinent surgical history.  Family History  Problem Relation Age of Onset   Cancer Other    Diabetes Other    CAD Other    Healthy Mother     Social History:  reports that she has never smoked. She has never used  smokeless tobacco. She reports that she does not drink alcohol and does not use drugs.  Allergies: No Known Allergies  No current facility-administered medications on file prior to encounter.   Current Outpatient Medications on File Prior to Encounter  Medication Sig Dispense Refill   acetaminophen  (TYLENOL ) 500 MG tablet Take 500 mg by mouth every 6 (six) hours as needed.     fluconazole  (DIFLUCAN ) 150 MG tablet Take one tablet today and one tablet in 3 days if symptoms persist. (Patient not taking: Reported on 07/06/2024) 2 tablet 0    Review of Systems: A full ROS was attempted today and was able to be performed.  Systems assessed include - Constitutional, Eyes, HENT, Respiratory, Cardiovascular, Gastrointestinal, Genitourinary, Integument/breast, Hematologic/lymphatic, Musculoskeletal, Neurological, Behavioral/Psych, Endocrine, Allergic/Immunologic - with pertinent responses as per HPI.  Physical Examination: Temp:  [97.5 F (36.4 C)-99 F (37.2 C)] 99 F (37.2 C) (09/03 1622) Pulse Rate:  [72-88] 79 (09/03 1622) Resp:  [11-20] 16 (09/03 1622) BP: (106-139)/(62-105) 126/81 (09/03 1622) SpO2:  [94 %-100 %] 100 % (09/03 1622)  General - well nourished, well developed, in acute distress of HA.    Ophthalmologic - fundi not visualized due to noncooperation.    Cardiovascular - regular rhythm and rate  Neuro - awake, alert, eyes open, orientated to age, place, time. No aphasia, but moderate dysarthria and scanning speech, following all simple commands. Able to name and repeat in dysarthria voice. No gaze palsy, tracking  bilaterally, visual field full, left gaze nystagmus with direction to the left. No facial droop. Tongue midline. Bilateral UEs 5/5, no drift. Bilaterally LEs 5/5, no drift. Sensation symmetrical bilaterally, b/l FTN and heel-to-shin significant ataxia, gait not tested.    Data Reviewed: ECHOCARDIOGRAM COMPLETE Result Date: 07/06/2024    ECHOCARDIOGRAM REPORT   Patient  Name:   Cassidy Bruce Date of Exam: 07/06/2024 Medical Rec #:  [id number]      Height:       65.0 in Accession #:    [id number]     Weight:       140.0 lb Date of Birth:  [birth year]     BSA:          1.700 m Patient Age:    27 years       BP:           120/79 mmHg Patient Gender: F              HR:           77 bpm. Exam Location:  Inpatient Procedure: 2D Echo, Color Doppler and Cardiac Doppler (Both Spectral and Color            Flow Doppler were utilized during procedure). Indications:   Stroke i63.9  History:       Patient has no prior history of Echocardiogram examinations.                Stroke.  Sonographer:   Koleen Popper RDCS Referring      [phone] ANASTASSIA DOUTOVA Phys:  Sonographer Comments: Image acquisition challenging due to uncooperative patient. IMPRESSIONS  1. Left ventricular ejection fraction, by estimation, is 60 to 65%. The left ventricle has normal function. The left ventricle has no regional wall motion abnormalities. Left ventricular diastolic parameters were normal.  2. Right ventricular systolic function is normal. The right ventricular size is normal. There is normal pulmonary artery systolic pressure.  3. The mitral valve is normal in structure. Mild mitral valve regurgitation. No evidence of mitral stenosis.  4. The aortic valve is tricuspid. Aortic valve regurgitation is not visualized. visually opens well but gradient not assessed.  5. The inferior vena cava is normal in size with greater than 50% respiratory variability, suggesting right atrial pressure of 3 mmHg. FINDINGS  Left Ventricle: Left ventricular ejection fraction, by estimation, is 60 to 65%. The left ventricle has normal function. The left ventricle has no regional wall motion abnormalities. The left ventricular internal cavity size was normal in size. There is  no left ventricular hypertrophy. Left ventricular diastolic parameters were normal. Right Ventricle: The right ventricular size is normal. No increase in right  ventricular wall thickness. Right ventricular systolic function is normal. There is normal pulmonary artery systolic pressure. The tricuspid regurgitant velocity is 2.06 m/s, and  with an assumed right atrial pressure of 3 mmHg, the estimated right ventricular systolic pressure is 20.0 mmHg. Left Atrium: Left atrial size was normal in size. Right Atrium: Right atrial size was normal in size. Pericardium: There is no evidence of pericardial effusion. Mitral Valve: The mitral valve is normal in structure. Mild mitral valve regurgitation. No evidence of mitral valve stenosis. Tricuspid Valve: The tricuspid valve is normal in structure. Tricuspid valve regurgitation is mild . No evidence of tricuspid stenosis. Aortic Valve: The aortic valve is tricuspid. Aortic valve regurgitation is not visualized. Visually opens well but gradient not assessed. Pulmonic Valve: The pulmonic valve was normal in structure. Pulmonic valve regurgitation is  trivial. No evidence of pulmonic stenosis. Aorta: The aortic root is normal in size and structure. Venous: The inferior vena cava is normal in size with greater than 50% respiratory variability, suggesting right atrial pressure of 3 mmHg. IAS/Shunts: No atrial level shunt detected by color flow Doppler.  LEFT VENTRICLE PLAX 2D LVIDd:         5.00 cm   Diastology LVIDs:         3.60 cm   LV e' medial:    11.60 cm/s LV PW:         0.70 cm   LV E/e' medial:  6.2 LV IVS:        0.70 cm   LV e' lateral:   13.20 cm/s LVOT diam:     1.90 cm   LV E/e' lateral: 5.5 LV SV:         63 LV SV Index:   37 LVOT Area:     2.84 cm  RIGHT VENTRICLE             IVC RV S prime:     17.30 cm/s  IVC diam: 1.90 cm TAPSE (M-mode): 2.9 cm LEFT ATRIUM             Index        RIGHT ATRIUM           Index LA diam:        3.10 cm 1.82 cm/m   RA Area:     11.60 cm LA Vol (A2C):   44.9 ml 26.41 ml/m  RA Volume:   25.10 ml  14.77 ml/m LA Vol (A4C):   20.6 ml 12.12 ml/m LA Biplane Vol: 30.0 ml 17.65 ml/m  AORTIC  VALVE             PULMONIC VALVE LVOT Vmax:   115.00 cm/s PR End Diast Vel: 1.51 msec LVOT Vmean:  73.300 cm/s LVOT VTI:    0.221 m  AORTA Ao Root diam: 2.80 cm Ao Asc diam:  2.70 cm MITRAL VALVE               TRICUSPID VALVE MV Area (PHT): 3.68 cm    TR Peak grad:   17.0 mmHg MV Decel Time: 206 msec    TR Vmax:        206.00 cm/s MR Peak grad: 24.2 mmHg MR Vmax:      246.00 cm/s  SHUNTS MV E velocity: 72.40 cm/s  Systemic VTI:  0.22 m MV A velocity: 86.50 cm/s  Systemic Diam: 1.90 cm MV E/A ratio:  0.84 Franck Azobou Tonleu Electronically signed by Joelle Cedars Tonleu Signature Date/Time: 07/06/2024/11:37:42 AM    Final    CT ANGIO HEAD NECK W WO CM W PERF (CODE STROKE) Result Date: 07/05/2024 EXAM: CTA Head and Neck with Perfusion 07/05/2024 08:55:08 PM TECHNIQUE: CTA of the head and neck was performed with the administration of intravenous contrast. 3D postprocessing with multiplanar reconstructions and MIPs was performed to evaluate the vascular anatomy. Automated exposure control, iterative reconstruction, and/or weight based adjustment of the mA/kV was utilized to reduce the radiation dose to as low as reasonably achievable. COMPARISON: 07/05/2024 head CT CLINICAL HISTORY: Neuro deficit, acute, stroke suspected; ataxia, slurred speech, new small cerebellar stroke on CTH. Triage notes: seizure activity stated by her roommate. Pt described as overly emotional, was rolling around and throwing her hands around, keeps writing in a diary she has. Did not hit her head, no oral injury, no loss of urine.  A\T\Ox4. VS stable. FINDINGS: CTA NECK: AORTIC ARCH AND ARCH VESSELS: No dissection or arterial injury. No significant stenosis of the brachiocephalic or subclavian arteries. CERVICAL CAROTID ARTERIES: No dissection, arterial injury, or hemodynamically significant stenosis by NASCET criteria. CERVICAL VERTEBRAL ARTERIES: No opacification of the left vertebral artery distal V1 and proximal V2 segments. There is  reconstitution at the distal V2 segment but enhancement remains asymmetrically decreased throughout the remainder of the course of the vertebral artery. The right vertebral artery is normal. No proximal occlusion visualized of the inferior cerebellar arteries. LUNGS AND MEDIASTINUM: Unremarkable. SOFT TISSUES: No acute abnormality. BONES: No acute abnormality. CTA HEAD: ANTERIOR CIRCULATION: No significant stenosis of the internal carotid arteries. No significant stenosis of the anterior cerebral arteries. No significant stenosis of the middle cerebral arteries. No aneurysm. POSTERIOR CIRCULATION: No significant stenosis of the posterior cerebral arteries. No significant stenosis of the basilar artery. No significant stenosis of the vertebral arteries. No aneurysm. OTHER: No dural venous sinus thrombosis on this non-dedicated study. CT PERFUSION: Fusion imaging shows a 13 mL region of ischemia affecting both cerebellar hemispheres but worse on the left. No core infarct. EXAM QUALITY: Exam quality is adequate with diagnostic perfusion maps. No significant motion artifact. Appropriate arterial inflow and venous outflow curves. CORE INFARCT (CBF<30% volume): 0 mL TOTAL HYPOPERFUSION (Tmax>6s volume): 13 mL PENUMBRA: Mismatch volume: 13 mL Mismatch ratio: Not applicable Location: Cerebellar hemispheres, worse on the left. IMPRESSION: 1. Proximal left vertebral artery dissection. Distal v2 segment reconstitution but with attenuated enhancement relative to the contralateral side along the remainder of its course. 2. A 13 mL region of ischemia affecting both cerebellar hemispheres, worse on the left, with no core infarct. Findings discussed with Dr. Caron Salt at 09:05 pm on 07/05/2024 Electronically signed by: Franky Stanford MD 07/05/2024 09:07 PM EDT RP Workstation: [id number]   CT Head Wo Contrast Result Date: 07/05/2024 EXAM: CT HEAD WITHOUT CONTRAST 07/05/2024 06:33:51 PM TECHNIQUE: CT of the head was performed  without the administration of intravenous contrast. Automated exposure control, iterative reconstruction, and/or weight based adjustment of the mA/kV was utilized to reduce the radiation dose to as low as reasonably achievable. COMPARISON: 08/24/2019 CLINICAL HISTORY: Seizure, new-onset, no history of trauma. Per chart: Pt BIB ems for seizure activity stated by her roommate. Pt described as overly emotional, was rolling around and throwing her hands around, keeps writing in a diary she has. Did not hit her head, no oral injury, no loss of urine. FINDINGS: BRAIN AND VENTRICLES: No acute hemorrhage. No evidence of acute infarct. No hydrocephalus. No extra-axial collection. No mass effect or midline shift. Old right cerebellar infarct but new since the prior study. ORBITS: No acute abnormality. SINUSES: No acute abnormality. SOFT TISSUES AND SKULL: No acute soft tissue abnormality. No skull fracture. IMPRESSION: 1. No acute intracranial abnormality. 2. New right cerebellar infarct since the prior study (2020), but likely chronic. Electronically signed by: Franky Stanford MD 07/05/2024 06:49 PM EDT RP Workstation: [id number]   DG Chest Portable 1 View Result Date: 07/05/2024 EXAM: 1 VIEW XRAY OF THE CHEST 07/05/2024 05:36:00 PM COMPARISON: 12/31/2017 CLINICAL HISTORY: ams. Per chart: Pt BIB ems for seizure activity stated by her roommate. Pt described as overly emotional, was rolling around and throwing her hands around, keeps writing in a diary she has. Did not hit her head, no oral injury, no loss of urine. A\T\Ox4. VS ; stable FINDINGS: LUNGS AND PLEURA: Low lung volumes. No focal pulmonary opacity. No pulmonary edema. No pleural effusion. No  pneumothorax. HEART AND MEDIASTINUM: No acute abnormality of the cardiac and mediastinal silhouettes. BONES AND SOFT TISSUES: No acute osseous abnormality. IMPRESSION: 1. No acute process. 2. Low lung volumes. Electronically signed by: Franky Stanford MD 07/05/2024 06:47 PM EDT RP  Workstation: [id number]    Assessment: 28 y.o. female PMH of MDD, ADHD, depression admitted for dizziness, slurry speech, headache and imbalance on walking.  Exam showed dysarthria, scanning speech, bilateral significant ataxia and left gaze nystagmus. CT concerning for right cerebellar infarct.  CTA head and neck also showed left VA proximal occlusion concerning for dissection.  CTP showed bilateral cerebellum penumbra 13 cc, left more than right, no core infarct.  MRI showed b/l cerebellar infarct, L>R.  Patient stroke could be from left VA dissection due to her workout with weightlifting, however part of right cerebellar infarct seem to belong to PICA territory which cannot be explained by left VA occlusion.  Will do further embolic workup including TEE,   On aspirin , Plavix  and Crestor .   Plan: Frequent neuro checks Telemetry monitoring TEE, TCD bubble study, LE venous Doppler, hypercoagulable workup. PT/OT/speech consult GI and DVT prophylaxis  Continue aspirin , Plavix  and Crestor  Stroke risk factor modification Discussed with Dr. Perri We will follow  Thank you for this consultation and allowing us  to participate in the care of this patient.  Ary Cummins, MD PhD Stroke Neurology 07/06/2024 6:42 PM

## 2024-07-06 NOTE — ED Notes (Signed)
 IV is positional and pt is not able to keep arm straight.  Attempted to start second IV without success, EMT-P attempted as well including with US  without success.  IV team consult placed

## 2024-07-06 NOTE — Assessment & Plan Note (Signed)
-   will replace electrolytes and repeat  check Mg, phos and Ca level and replace as needed Monitor on telemetry   Lab Results  Component Value Date   K 3.2 (L) 07/05/2024     Lab Results  Component Value Date   CREATININE 0.79 07/05/2024   No results found for: MG Lab Results  Component Value Date   CALCIUM  9.0 07/05/2024

## 2024-07-06 NOTE — Hospital Course (Addendum)
 The patient is a 28 year old African-American female with past medical history significant for Padonda to depression who presented to the hospital with seizure-like activity and has been having intermittent headaches.  Yesterday around 3:30 PM she went unresponsive was noted to be shaking all over and would not follow commands or answering questions.  Brought to the ED for further evaluation and acetaminophen  and salicylate levels were negative and alcohol was negative.  She appeared ataxic and CTA of the head neck was done and showed a proximal left vertebral artery dissection.  Neuro was consulted and recommended stroke workup and transferring the the patient to [facility] for further evaluation.  He continues to complain of severe headache and continues to have incoordination, imbalance and slurred speech.  Assessment and Plan:  Slurred Speech and Weakness in the setting of suspected CVA r/o Seizures: Admitted based on TIA/CVA protocol and transferred to [facility] for further workup. CTM on Telemetry. Patient is not a candidate for Thrombolytic. MRI Pending. Head CT w/o Contrast showed No acute intracranial abnormality but did show New right cerebellar infarct since the prior study (2020), but likely chronic. CTA Head and Neck done showed Proximal left vertebral artery dissection. Distal v2 segment reconstitution but with attenuated enhancement relative to the contralateral side along the remainder of its course. A 13 mL region of ischemia affecting both cerebellar hemispheres, worse on the left, with no core infarct. Echo to evaluate for possible embolic source showed LVEF of 60-65% w/ no RWMA and no mention of  thrombus. Lipid Panel done and as below; TSH pending. Obtain PT/OT/SLP Evaluation. Keep nothing by mouth until passes swallow eval  will need Speech pathology evaluation. Will make sure patient is on antiplatelet ASA 81 and statin if LDL >70. Allow permissive Hypertension keep BP <220/120. Neurology  consulted and appreciate further evaluation and reccs. EEG Done and pending reccs  HLD: Lipid Panel done and total cholesterol/HDL ratio 2.7, cholesterol of 179, HDL 67, LDL 104, triglycerides of 42, VLDL of 8.   Metabolic Acidosis: Mild.  Has a CO2 of 21, anion gap of 8, chloride level 106.  Continue to trend and repeat CMP in the a.m.  Microcytosis w/o Anemia: Hgb/Hct is stable at 12.8/39.4 but MCV is 77.9. CTM for Anemia  Elevated ALT: ALT went from 58 -> 60. CTM and Trend and repeat CMP in the AM  Leukocytosis: Mild and likely reactive 2/2 to above. WBC went from 7.6 -> 12.0. CTM for S/Sx of infection; Repeat CBC in the AM   Thrombocytopenia: Plt Count went from 138 -> 125. CTM for S/Sx of Bleeding; No overt bleeding noted. Repeat CBC in the AM

## 2024-07-06 NOTE — Progress Notes (Signed)
 PROGRESS NOTE    Cassidy Bruce  FMW:[id number] DOB: 05/24/1996 DOA: 07/05/2024 PCP: Pcp, No   Brief Narrative:  The patient is a 28 year old African-American female with past medical history significant for Padonda to depression who presented to the hospital with seizure-like activity and has been having intermittent headaches.  Yesterday around 3:30 PM she went unresponsive was noted to be shaking all over and would not follow commands or answering questions.  Brought to the ED for further evaluation and acetaminophen  and salicylate levels were negative and alcohol was negative.  She appeared ataxic and CTA of the head neck was done and showed a proximal left vertebral artery dissection.  Neuro was consulted and recommended stroke workup and transferring the the patient to [facility] for further evaluation.  He continues to complain of severe headache and continues to have incoordination, imbalance and slurred speech.  Assessment and Plan:  Slurred Speech and Weakness in the setting of suspected CVA r/o Seizures: Admitted based on TIA/CVA protocol and transferred to [facility] for further workup. CTM on Telemetry. Patient is not a candidate for Thrombolytic. MRI Pending. Head CT w/o Contrast showed No acute intracranial abnormality but did show New right cerebellar infarct since the prior study (2020), but likely chronic. CTA Head and Neck done showed Proximal left vertebral artery dissection. Distal v2 segment reconstitution but with attenuated enhancement relative to the contralateral side along the remainder of its course. A 13 mL region of ischemia affecting both cerebellar hemispheres, worse on the left, with no core infarct. Echo to evaluate for possible embolic source showed LVEF of 60-65% w/ no RWMA and no mention of  thrombus. Lipid Panel done and as below; TSH pending. Obtain PT/OT/SLP Evaluation. Keep nothing by mouth until passes swallow eval  will need Speech pathology evaluation. Will make sure  patient is on antiplatelet ASA 81 and statin if LDL >70. Allow permissive Hypertension keep BP <220/120. Neurology consulted and appreciate further evaluation and reccs. EEG Done and pending reccs  HLD: Lipid Panel done and total cholesterol/HDL ratio 2.7, cholesterol of 179, HDL 67, LDL 104, triglycerides of 42, VLDL of 8.   Metabolic Acidosis: Mild.  Has a CO2 of 21, anion gap of 8, chloride level 106.  Continue to trend and repeat CMP in the a.m.  Microcytosis w/o Anemia: Hgb/Hct is stable at 12.8/39.4 but MCV is 77.9. CTM for Anemia  Elevated ALT: ALT went from 58 -> 60. CTM and Trend and repeat CMP in the AM  Leukocytosis: Mild and likely reactive 2/2 to above. WBC went from 7.6 -> 12.0. CTM for S/Sx of infection; Repeat CBC in the AM   Thrombocytopenia: Plt Count went from 138 -> 125. CTM for S/Sx of Bleeding; No overt bleeding noted. Repeat CBC in the AM   DVT prophylaxis: SCD's Start: 07/06/24 0601    Code Status: Full Code Family Communication: No family present @ bedside  Disposition Plan:  Level of care: Progressive Status is: Inpatient Remains inpatient appropriate because: Undergoing CVA workup   Consultants:  Neurology  Procedures:  As delineated as above  Antimicrobials:  Anti-infectives (From admission, onward)    None       Subjective: Seen and examined at bedside she is slurring her words and complaining of a headache.  Did not feel well.  Seemed agitated.  Also appeared a little confused.  No family at bedside.  No other concerns or complaints at this time.  Objective: Vitals:   07/06/24 0800 07/06/24 0909 07/06/24 1118 07/06/24  1433  BP: 124/83  126/81 (!) 130/102  Pulse: 72  88 74  Resp: 17  11 15   Temp:  98.6 F (37 C) 98.7 F (37.1 C) 98.5 F (36.9 C)  TempSrc:  Oral Oral Oral  SpO2: 100%  99% 100%    Intake/Output Summary (Last 24 hours) at 07/06/2024 1559 Last data filed at 07/06/2024 1535 Gross per 24 hour  Intake 1168.33 ml  Output --   Net 1168.33 ml   There were no vitals filed for this visit.  Examination: Physical Exam:  Constitutional: Thin African-American female who appears confused and a little agitated Respiratory: Diminished to auscultation bilaterally, no wheezing, rales, rhonchi or crackles. Normal respiratory effort and patient is not tachypenic. No accessory muscle use.  Unlabored breathing Cardiovascular: RRR, no murmurs / rubs / gallops. S1 and S2 auscultated. No extremity edema.  Abdomen: Soft, non-tender, non-distended. Bowel sounds positive.  GU: Deferred. Musculoskeletal: No clubbing / cyanosis of digits/nails. No joint deformity upper and lower extremities.  Skin: No rashes, lesions, ulcers limited skin evaluation. No induration; Warm and dry.  Neurologic: Has some left-sided weakness and slurring her speech Psychiatric: Fair judgment insight and appears well confused and agitated  Data Reviewed: I have personally reviewed following labs and imaging studies  CBC: Recent Labs  Lab 07/05/24 1653 07/06/24 0625  WBC 7.6 12.0*  NEUTROABS 5.2  --   HGB 13.3 12.8  HCT 40.1 39.4  MCV 77.7* 77.9*  PLT 138* 125*   Basic Metabolic Panel: Recent Labs  Lab 07/05/24 1653 07/06/24 0625  NA 142 139  K 3.2* 4.6  CL 107 106  CO2 21* 21*  GLUCOSE 123* 90  BUN 9 8  CREATININE 0.79 0.70  CALCIUM  9.0 9.2  MG 2.1  --   PHOS 2.2*  --    GFR: CrCl cannot be calculated (Unknown ideal weight.). Liver Function Tests: Recent Labs  Lab 07/05/24 1653 07/06/24 0625  AST 38 36  ALT 58* 60*  ALKPHOS 78 77  BILITOT 0.5 0.4  PROT 7.1 6.9  ALBUMIN 4.3 4.1   No results for input(s): LIPASE, AMYLASE in the last 168 hours. No results for input(s): AMMONIA in the last 168 hours. Coagulation Profile: No results for input(s): INR, PROTIME in the last 168 hours. Cardiac Enzymes: Recent Labs  Lab 07/05/24 1653  CKTOTAL 284*   BNP (last 3 results) No results for input(s): PROBNP in the  last 8760 hours. HbA1C: Recent Labs    07/06/24 0625  HGBA1C 5.4   CBG: Recent Labs  Lab 07/05/24 1705  GLUCAP 97   Lipid Profile: Recent Labs    07/06/24 0625  CHOL 179  HDL 67  LDLCALC 104*  TRIG 42  CHOLHDL 2.7   Thyroid Function Tests: No results for input(s): TSH, T4TOTAL, FREET4, T3FREE, THYROIDAB in the last 72 hours. Anemia Panel: No results for input(s): VITAMINB12, FOLATE, FERRITIN, TIBC, IRON, RETICCTPCT in the last 72 hours. Sepsis Labs: Recent Labs  Lab 07/06/24 0156  LATICACIDVEN 1.1   No results found for this or any previous visit (from the past 240 hours).   Radiology Studies: ECHOCARDIOGRAM COMPLETE Result Date: 07/06/2024    ECHOCARDIOGRAM REPORT   Patient Name:   MERTICE UFFELMAN Date of Exam: 07/06/2024 Medical Rec #:  [id number]      Height:       65.0 in Accession #:    [id number]     Weight:       140.0 lb Date of Birth:  07/26/1996     BSA:          1.700 m Patient Age:    27 years       BP:           120/79 mmHg Patient Gender: F              HR:           77 bpm. Exam Location:  Inpatient Procedure: 2D Echo, Color Doppler and Cardiac Doppler (Both Spectral and Color            Flow Doppler were utilized during procedure). Indications:   Stroke i63.9  History:       Patient has no prior history of Echocardiogram examinations.                Stroke.  Sonographer:   Koleen Popper RDCS Referring      [phone] ANASTASSIA DOUTOVA Phys:  Sonographer Comments: Image acquisition challenging due to uncooperative patient. IMPRESSIONS  1. Left ventricular ejection fraction, by estimation, is 60 to 65%. The left ventricle has normal function. The left ventricle has no regional wall motion abnormalities. Left ventricular diastolic parameters were normal.  2. Right ventricular systolic function is normal. The right ventricular size is normal. There is normal pulmonary artery systolic pressure.  3. The mitral valve is normal in structure. Mild mitral  valve regurgitation. No evidence of mitral stenosis.  4. The aortic valve is tricuspid. Aortic valve regurgitation is not visualized. visually opens well but gradient not assessed.  5. The inferior vena cava is normal in size with greater than 50% respiratory variability, suggesting right atrial pressure of 3 mmHg. FINDINGS  Left Ventricle: Left ventricular ejection fraction, by estimation, is 60 to 65%. The left ventricle has normal function. The left ventricle has no regional wall motion abnormalities. The left ventricular internal cavity size was normal in size. There is  no left ventricular hypertrophy. Left ventricular diastolic parameters were normal. Right Ventricle: The right ventricular size is normal. No increase in right ventricular wall thickness. Right ventricular systolic function is normal. There is normal pulmonary artery systolic pressure. The tricuspid regurgitant velocity is 2.06 m/s, and  with an assumed right atrial pressure of 3 mmHg, the estimated right ventricular systolic pressure is 20.0 mmHg. Left Atrium: Left atrial size was normal in size. Right Atrium: Right atrial size was normal in size. Pericardium: There is no evidence of pericardial effusion. Mitral Valve: The mitral valve is normal in structure. Mild mitral valve regurgitation. No evidence of mitral valve stenosis. Tricuspid Valve: The tricuspid valve is normal in structure. Tricuspid valve regurgitation is mild . No evidence of tricuspid stenosis. Aortic Valve: The aortic valve is tricuspid. Aortic valve regurgitation is not visualized. Visually opens well but gradient not assessed. Pulmonic Valve: The pulmonic valve was normal in structure. Pulmonic valve regurgitation is trivial. No evidence of pulmonic stenosis. Aorta: The aortic root is normal in size and structure. Venous: The inferior vena cava is normal in size with greater than 50% respiratory variability, suggesting right atrial pressure of 3 mmHg. IAS/Shunts: No atrial  level shunt detected by color flow Doppler.  LEFT VENTRICLE PLAX 2D LVIDd:         5.00 cm   Diastology LVIDs:         3.60 cm   LV e' medial:    11.60 cm/s LV PW:         0.70 cm   LV E/e' medial:  6.2 LV  IVS:        0.70 cm   LV e' lateral:   13.20 cm/s LVOT diam:     1.90 cm   LV E/e' lateral: 5.5 LV SV:         63 LV SV Index:   37 LVOT Area:     2.84 cm  RIGHT VENTRICLE             IVC RV S prime:     17.30 cm/s  IVC diam: 1.90 cm TAPSE (M-mode): 2.9 cm LEFT ATRIUM             Index        RIGHT ATRIUM           Index LA diam:        3.10 cm 1.82 cm/m   RA Area:     11.60 cm LA Vol (A2C):   44.9 ml 26.41 ml/m  RA Volume:   25.10 ml  14.77 ml/m LA Vol (A4C):   20.6 ml 12.12 ml/m LA Biplane Vol: 30.0 ml 17.65 ml/m  AORTIC VALVE             PULMONIC VALVE LVOT Vmax:   115.00 cm/s PR End Diast Vel: 1.51 msec LVOT Vmean:  73.300 cm/s LVOT VTI:    0.221 m  AORTA Ao Root diam: 2.80 cm Ao Asc diam:  2.70 cm MITRAL VALVE               TRICUSPID VALVE MV Area (PHT): 3.68 cm    TR Peak grad:   17.0 mmHg MV Decel Time: 206 msec    TR Vmax:        206.00 cm/s MR Peak grad: 24.2 mmHg MR Vmax:      246.00 cm/s  SHUNTS MV E velocity: 72.40 cm/s  Systemic VTI:  0.22 m MV A velocity: 86.50 cm/s  Systemic Diam: 1.90 cm MV E/A ratio:  0.84 Franck Azobou Tonleu Electronically signed by Joelle Cedars Tonleu Signature Date/Time: 07/06/2024/11:37:42 AM    Final    CT ANGIO HEAD NECK W WO CM W PERF (CODE STROKE) Result Date: 07/05/2024 EXAM: CTA Head and Neck with Perfusion 07/05/2024 08:55:08 PM TECHNIQUE: CTA of the head and neck was performed with the administration of intravenous contrast. 3D postprocessing with multiplanar reconstructions and MIPs was performed to evaluate the vascular anatomy. Automated exposure control, iterative reconstruction, and/or weight based adjustment of the mA/kV was utilized to reduce the radiation dose to as low as reasonably achievable. COMPARISON: 07/05/2024 head CT CLINICAL HISTORY: Neuro  deficit, acute, stroke suspected; ataxia, slurred speech, new small cerebellar stroke on CTH. Triage notes: seizure activity stated by her roommate. Pt described as overly emotional, was rolling around and throwing her hands around, keeps writing in a diary she has. Did not hit her head, no oral injury, no loss of urine. A\T\Ox4. VS stable. FINDINGS: CTA NECK: AORTIC ARCH AND ARCH VESSELS: No dissection or arterial injury. No significant stenosis of the brachiocephalic or subclavian arteries. CERVICAL CAROTID ARTERIES: No dissection, arterial injury, or hemodynamically significant stenosis by NASCET criteria. CERVICAL VERTEBRAL ARTERIES: No opacification of the left vertebral artery distal V1 and proximal V2 segments. There is reconstitution at the distal V2 segment but enhancement remains asymmetrically decreased throughout the remainder of the course of the vertebral artery. The right vertebral artery is normal. No proximal occlusion visualized of the inferior cerebellar arteries. LUNGS AND MEDIASTINUM: Unremarkable. SOFT TISSUES: No acute abnormality. BONES: No acute abnormality. CTA HEAD:  ANTERIOR CIRCULATION: No significant stenosis of the internal carotid arteries. No significant stenosis of the anterior cerebral arteries. No significant stenosis of the middle cerebral arteries. No aneurysm. POSTERIOR CIRCULATION: No significant stenosis of the posterior cerebral arteries. No significant stenosis of the basilar artery. No significant stenosis of the vertebral arteries. No aneurysm. OTHER: No dural venous sinus thrombosis on this non-dedicated study. CT PERFUSION: Fusion imaging shows a 13 mL region of ischemia affecting both cerebellar hemispheres but worse on the left. No core infarct. EXAM QUALITY: Exam quality is adequate with diagnostic perfusion maps. No significant motion artifact. Appropriate arterial inflow and venous outflow curves. CORE INFARCT (CBF<30% volume): 0 mL TOTAL HYPOPERFUSION (Tmax>6s  volume): 13 mL PENUMBRA: Mismatch volume: 13 mL Mismatch ratio: Not applicable Location: Cerebellar hemispheres, worse on the left. IMPRESSION: 1. Proximal left vertebral artery dissection. Distal v2 segment reconstitution but with attenuated enhancement relative to the contralateral side along the remainder of its course. 2. A 13 mL region of ischemia affecting both cerebellar hemispheres, worse on the left, with no core infarct. Findings discussed with Dr. Caron Salt at 09:05 pm on 07/05/2024 Electronically signed by: Franky Stanford MD 07/05/2024 09:07 PM EDT RP Workstation: [id number]   CT Head Wo Contrast Result Date: 07/05/2024 EXAM: CT HEAD WITHOUT CONTRAST 07/05/2024 06:33:51 PM TECHNIQUE: CT of the head was performed without the administration of intravenous contrast. Automated exposure control, iterative reconstruction, and/or weight based adjustment of the mA/kV was utilized to reduce the radiation dose to as low as reasonably achievable. COMPARISON: 08/24/2019 CLINICAL HISTORY: Seizure, new-onset, no history of trauma. Per chart: Pt BIB ems for seizure activity stated by her roommate. Pt described as overly emotional, was rolling around and throwing her hands around, keeps writing in a diary she has. Did not hit her head, no oral injury, no loss of urine. FINDINGS: BRAIN AND VENTRICLES: No acute hemorrhage. No evidence of acute infarct. No hydrocephalus. No extra-axial collection. No mass effect or midline shift. Old right cerebellar infarct but new since the prior study. ORBITS: No acute abnormality. SINUSES: No acute abnormality. SOFT TISSUES AND SKULL: No acute soft tissue abnormality. No skull fracture. IMPRESSION: 1. No acute intracranial abnormality. 2. New right cerebellar infarct since the prior study (2020), but likely chronic. Electronically signed by: Franky Stanford MD 07/05/2024 06:49 PM EDT RP Workstation: [id number]   DG Chest Portable 1 View Result Date: 07/05/2024 EXAM: 1 VIEW XRAY OF  THE CHEST 07/05/2024 05:36:00 PM COMPARISON: 12/31/2017 CLINICAL HISTORY: ams. Per chart: Pt BIB ems for seizure activity stated by her roommate. Pt described as overly emotional, was rolling around and throwing her hands around, keeps writing in a diary she has. Did not hit her head, no oral injury, no loss of urine. A\T\Ox4. VS ; stable FINDINGS: LUNGS AND PLEURA: Low lung volumes. No focal pulmonary opacity. No pulmonary edema. No pleural effusion. No pneumothorax. HEART AND MEDIASTINUM: No acute abnormality of the cardiac and mediastinal silhouettes. BONES AND SOFT TISSUES: No acute osseous abnormality. IMPRESSION: 1. No acute process. 2. Low lung volumes. Electronically signed by: Franky Stanford MD 07/05/2024 06:47 PM EDT RP Workstation: [id number]   Scheduled Meds:  aspirin  EC  81 mg Oral Daily   thiamine  (VITAMIN B1) injection  100 mg Intravenous Daily   Continuous Infusions:  sodium chloride  75 mL/hr at 07/06/24 1535    LOS: 1 day   Alejandro Marker, DO [facility] Available via Epic secure chat 7am-7pm After these hours, please refer to coverage provider listed on [email or web address]  07/06/2024, 3:59 PM

## 2024-07-06 NOTE — Progress Notes (Signed)
 PT Cancellation Note  Patient Details Name: Cassidy Bruce MRN: [id number] DOB: Jan 30, 1996   Cancelled Treatment:    Reason Eval/Treat Not Completed: Other (comment)  Patient transferring to [facility].  Darice Potters PT Acute Rehabilitation Services Office [phone]  [name] Norris 07/06/2024, 1:30 PM

## 2024-07-06 NOTE — ED Notes (Signed)
 Phone call from [employer].  I ask pt if I could talk with her and she said yes.  Pt was ask several times to make sure and she said yes.  I advised Ms Silveria that pt has had a stroke and she  would be transferring to [facility] for Neuro care in the next few hours.

## 2024-07-06 NOTE — ED Notes (Signed)
 Pt has been moving in bed while sleeping and came off monitoring.  Placed EKG leads back on pt and placed pt in new gown and covered with blankets.  Pt is sleeping but arousable and vocal and moving all 4 extremities.  Encouraged pt to keep arm straight to allow IV to complete infusing.  IV team here at this time

## 2024-07-06 NOTE — Procedures (Signed)
 Patient Name: Cassidy Bruce  MRN: [id number]  Epilepsy Attending: Arlin MALVA Krebs  Referring Physician/Provider: Doutova, Anastassia, MD  Date: 07/07/2024 Duration: 23.24 mins  Patient history: 28yo F with seizure like activity. EEG to evaluate for seizure  Level of alertness: Awake  AEDs during EEG study: None  Technical aspects: This EEG study was done with scalp electrodes positioned according to the 10-20 International system of electrode placement. Electrical activity was reviewed with band pass filter of 1-70Hz , sensitivity of 7 uV/mm, display speed of 35mm/sec with a 60Hz  notched filter applied as appropriate. EEG data were recorded continuously and digitally stored.  Video monitoring was available and reviewed as appropriate.  Description: The posterior dominant rhythm consists of 8-9 Hz activity of moderate voltage (25-35 uV) seen predominantly in posterior head regions, symmetric and reactive to eye opening and eye closing. Hyperventilation and photic stimulation were not performed.     IMPRESSION: This study is within normal limits. No seizures or epileptiform discharges were seen throughout the recording.  A normal interictal EEG does not exclude the diagnosis of epilepsy.   Briele Lagasse O Christee Mervine

## 2024-07-06 NOTE — Plan of Care (Signed)

## 2024-07-06 NOTE — ED Notes (Signed)
 ED TO INPATIENT HANDOFF REPORT  ED Nurse Name and Phone #: Joaquim [phone]  S Name/Age/Gender Cassidy Bruce 28 y.o. female Room/Bed: WA04/WA04  Code Status   Code Status: Full Code  Home/SNF/Other Home Patient oriented to: self, place, time, and situation Is this baseline? Yes   Triage Complete: Triage complete  Chief Complaint Stroke [facility]) [I63.9]  Triage Note Pt BIB ems for seizure activity stated by her roommate. Pt described as overly emotional, was rolling around and throwing her hands around, keeps writing in a diary she has. Did not hit her head, no oral injury, no loss of urine. A&Ox4. VS stable   Allergies No Known Allergies  Level of Care/Admitting Diagnosis ED Disposition     ED Disposition  Admit   Condition  --   Comment  Hospital Area: [facility] [[id number]]  Level of Care: Progressive [102]  Admit to Progressive based on following criteria: CARDIOVASCULAR & THORACIC of moderate stability with acute coronary syndrome symptoms/low risk myocardial infarction/hypertensive urgency/arrhythmias/heart failure potentially compromising stability and stable post cardiovascular intervention patients.  May admit patient to [facility] or [facility] if equivalent level of care is available:: No  Covid Evaluation: Asymptomatic - no recent exposure (last 10 days) testing not required  Diagnosis: Stroke [facility]) [[id number]]  Admitting Physician: DOUTOVA, ANASTASSIA [[id number]]  Attending Physician: DOUTOVA, ANASTASSIA [[id number]]  Certification:: I certify this patient will need inpatient services for at least 2 midnights  Expected Medical Readiness: 07/07/2024          B Medical/Surgery History Past Medical History:  Diagnosis Date   ADHD    Depression    History reviewed. No pertinent surgical history.   A IV Location/Drains/Wounds Patient Lines/Drains/Airways Status     Active Line/Drains/Airways     Name Placement date Placement time Site Days    Peripheral IV 07/05/24 20 G Right Antecubital 07/05/24  1600  Antecubital  1            Intake/Output Last 24 hours  Intake/Output Summary (Last 24 hours) at 07/06/2024 1201 Last data filed at 07/06/2024 9380 Gross per 24 hour  Intake 400 ml  Output --  Net 400 ml    Labs/Imaging Results for orders placed or performed during the hospital encounter of 07/05/24 (from the past 48 hours)  CBC with Differential     Status: Abnormal   Collection Time: 07/05/24  4:53 PM  Result Value Ref Range   WBC 7.6 4.0 - 10.5 K/uL   RBC 5.16 (H) 3.87 - 5.11 MIL/uL   Hemoglobin 13.3 12.0 - 15.0 g/dL   HCT 59.8 63.9 - 53.9 %   MCV 77.7 (L) 80.0 - 100.0 fL   MCH 25.8 (L) 26.0 - 34.0 pg   MCHC 33.2 30.0 - 36.0 g/dL   RDW 85.2 88.4 - 84.4 %   Platelets 138 (L) 150 - 400 K/uL   nRBC 0.0 0.0 - 0.2 %   Neutrophils Relative % 68 %   Neutro Abs 5.2 1.7 - 7.7 K/uL   Lymphocytes Relative 20 %   Lymphs Abs 1.5 0.7 - 4.0 K/uL   Monocytes Relative 10 %   Monocytes Absolute 0.8 0.1 - 1.0 K/uL   Eosinophils Relative 1 %   Eosinophils Absolute 0.1 0.0 - 0.5 K/uL   Basophils Relative 1 %   Basophils Absolute 0.1 0.0 - 0.1 K/uL   Immature Granulocytes 0 %   Abs Immature Granulocytes 0.02 0.00 - 0.07 K/uL    Comment: Performed at  [facility], [address], [place], [place] [address]  Comprehensive metabolic panel     Status: Abnormal   Collection Time: 07/05/24  4:53 PM  Result Value Ref Range   Sodium 142 135 - 145 mmol/L   Potassium 3.2 (L) 3.5 - 5.1 mmol/L   Chloride 107 98 - 111 mmol/L   CO2 21 (L) 22 - 32 mmol/L   Glucose, Bld 123 (H) 70 - 99 mg/dL    Comment: Glucose reference range applies only to samples taken after fasting for at least 8 hours.   BUN 9 6 - 20 mg/dL   Creatinine, Ser 9.20 0.44 - 1.00 mg/dL   Calcium  9.0 8.9 - 10.3 mg/dL   Total Protein 7.1 6.5 - 8.1 g/dL   Albumin 4.3 3.5 - 5.0 g/dL   AST 38 15 - 41 U/L   ALT 58 (H) 0 - 44 U/L   Alkaline Phosphatase 78  38 - 126 U/L   Total Bilirubin 0.5 0.0 - 1.2 mg/dL   GFR, Estimated >39 >39 mL/min    Comment: (NOTE) Calculated using the CKD-EPI Creatinine Equation (2021)    Anion gap 14 5 - 15    Comment: Performed at [facility], [address], [place], [place] [address]  hCG, serum, qualitative     Status: None   Collection Time: 07/05/24  4:53 PM  Result Value Ref Range   Preg, Serum NEGATIVE NEGATIVE    Comment:        THE SENSITIVITY OF THIS METHODOLOGY IS >10 mIU/mL. Performed at [facility], [address], [place], [place] [address]   Ethanol     Status: None   Collection Time: 07/05/24  4:53 PM  Result Value Ref Range   Alcohol, Ethyl (B) <15 <15 mg/dL    Comment: (NOTE) For medical purposes only. Performed at [facility], [address], [place], [place] [address]   Acetaminophen  level     Status: Abnormal   Collection Time: 07/05/24  4:53 PM  Result Value Ref Range   Acetaminophen  (Tylenol ), Serum <10 (L) 10 - 30 ug/mL    Comment: (NOTE) Toxic concentrations can be more effectively related to post dose interval; > 200, > 100, and > 50 ug/mL serum concentrations correspond to toxic concentrations at 4, 8, and 12 hours post dose, respectively.  Performed at [facility], [address] [place], [place] [address]   Salicylate level     Status: Abnormal   Collection Time: 07/05/24  4:53 PM  Result Value Ref Range   Salicylate Lvl <7.0 (L) 7.0 - 30.0 mg/dL    Comment: Performed at [facility], [address], [place], [place] [address]  CK     Status: Abnormal   Collection Time: 07/05/24  4:53 PM  Result Value Ref Range   Total CK 284 (H) 38 - 234 U/L    Comment: Performed at [facility], [address], [place], [place] [address]  Magnesium      Status: None   Collection Time: 07/05/24  4:53 PM  Result Value Ref Range   Magnesium  2.1 1.7 - 2.4 mg/dL     Comment: Performed at [facility], [address], [place], [place] [address]  Phosphorus     Status: Abnormal   Collection Time: 07/05/24  4:53 PM  Result Value Ref Range   Phosphorus 2.2 (L) 2.5 - 4.6 mg/dL    Comment: Performed  at [facility], [address] [place], [place] [address]  POC CBG, ED     Status: None   Collection Time: 07/05/24  5:05 PM  Result Value Ref Range   Glucose-Capillary 97 70 - 99 mg/dL    Comment: Glucose reference range applies only to samples taken after fasting for at least 8 hours.  Urine Drug Screen     Status: None   Collection Time: 07/05/24 10:25 PM  Result Value Ref Range   Opiates NEGATIVE NEGATIVE   Cocaine NEGATIVE NEGATIVE   Benzodiazepines NEGATIVE NEGATIVE   Amphetamines NEGATIVE NEGATIVE   Tetrahydrocannabinol NEGATIVE NEGATIVE   Barbiturates NEGATIVE NEGATIVE   Methadone Scn, Ur NEGATIVE NEGATIVE   Fentanyl  NEGATIVE NEGATIVE    Comment: (NOTE) Drug screen is for Medical Purposes only. Positive results are preliminary only. If confirmation is needed, notify lab within 5 days.  Drug Class                 Cutoff (ng/mL) Amphetamine and metabolites 1000 Barbiturate and metabolites 200 Benzodiazepine              200 Opiates and metabolites     300 Cocaine and metabolites     300 THC                         50 Fentanyl                     5 Methadone                   300  Trazodone  is metabolized in vivo to several metabolites,  including pharmacologically active m-CPP, which is excreted in the  urine.  Immunoassay screens for amphetamines and MDMA have potential  cross-reactivity with these compounds and may provide false positive  result.  Performed at [facility], [address], [place], [place] [address]   Lactic acid, plasma     Status: None   Collection Time: 07/06/24  1:56 AM  Result Value Ref Range   Lactic Acid, Venous 1.1 0.5 - 1.9 mmol/L    Comment:  Performed at [facility], [address], [place], [place] [address]  Sedimentation rate     Status: None   Collection Time: 07/06/24  1:56 AM  Result Value Ref Range   Sed Rate 4 0 - 22 mm/hr    Comment: Performed at [facility], [address], [place], [place] [address]  Lipid panel     Status: Abnormal   Collection Time: 07/06/24  6:25 AM  Result Value Ref Range   Cholesterol 179 0 - 200 mg/dL    Comment:        ATP III CLASSIFICATION:  <200     mg/dL   Desirable  799-760  mg/dL   Borderline High  >=759    mg/dL   High           Triglycerides 42 <150 mg/dL   HDL 67 >59 mg/dL   Total CHOL/HDL Ratio 2.7 RATIO   VLDL 8 0 - 40 mg/dL   LDL Cholesterol 895 (H) 0 - 99 mg/dL    Comment:        Total Cholesterol/HDL:CHD Risk Coronary Heart Disease Risk Table                     Men   Women  1/2 Average Risk  3.4   3.3  Average Risk       5.0   4.4  2 X Average Risk   9.6   7.1  3 X Average Risk  23.4   11.0        Use the calculated Patient Ratio above and the CHD Risk Table to determine the patient's CHD Risk.        ATP III CLASSIFICATION (LDL):  <100     mg/dL   Optimal  899-870  mg/dL   Near or Above                    Optimal  130-159  mg/dL   Borderline  839-810  mg/dL   High  >809     mg/dL   Very High Performed at [facility], [address], [place], [place] [address]   Hemoglobin A1c     Status: None   Collection Time: 07/06/24  6:25 AM  Result Value Ref Range   Hgb A1c MFr Bld 5.4 4.8 - 5.6 %    Comment: (NOTE) Diagnosis of Diabetes The following HbA1c ranges recommended by the American Diabetes Association (ADA) may be used as an aid in the diagnosis of diabetes mellitus.  Hemoglobin             Suggested A1C NGSP%              Diagnosis  <5.7                   Non Diabetic  5.7-6.4                Pre-Diabetic  >6.4                   Diabetic  <7.0                   Glycemic control  for                       adults with diabetes.     Mean Plasma Glucose 108.28 mg/dL    Comment: Performed at [facility], [address] [place], [place] [address]  CBC     Status: Abnormal   Collection Time: 07/06/24  6:25 AM  Result Value Ref Range   WBC 12.0 (H) 4.0 - 10.5 K/uL    Comment: WHITE COUNT CONFIRMED ON SMEAR   RBC 5.06 3.87 - 5.11 MIL/uL   Hemoglobin 12.8 12.0 - 15.0 g/dL   HCT 60.5 63.9 - 53.9 %   MCV 77.9 (L) 80.0 - 100.0 fL   MCH 25.3 (L) 26.0 - 34.0 pg   MCHC 32.5 30.0 - 36.0 g/dL   RDW 85.1 88.4 - 84.4 %   Platelets 125 (L) 150 - 400 K/uL   nRBC 0.0 0.0 - 0.2 %    Comment: Performed at [facility], [address], [place], [place] [address]  Comprehensive metabolic panel     Status: Abnormal   Collection Time: 07/06/24  6:25 AM  Result Value Ref Range   Sodium 139 135 - 145 mmol/L   Potassium 4.6 3.5 - 5.1 mmol/L    Comment: Delta check noted    Chloride 106 98 - 111 mmol/L   CO2 21 (L) 22 - 32 mmol/L   Glucose, Bld 90 70 - 99 mg/dL    Comment: Glucose reference range applies only to samples taken after fasting  for at least 8 hours.   BUN 8 6 - 20 mg/dL   Creatinine, Ser 9.29 0.44 - 1.00 mg/dL   Calcium  9.2 8.9 - 10.3 mg/dL   Total Protein 6.9 6.5 - 8.1 g/dL   Albumin 4.1 3.5 - 5.0 g/dL   AST 36 15 - 41 U/L   ALT 60 (H) 0 - 44 U/L   Alkaline Phosphatase 77 38 - 126 U/L   Total Bilirubin 0.4 0.0 - 1.2 mg/dL   GFR, Estimated >39 >39 mL/min    Comment: (NOTE) Calculated using the CKD-EPI Creatinine Equation (2021)    Anion gap 12 5 - 15    Comment: Performed at [facility], [address], [place], [place] [address]   ECHOCARDIOGRAM COMPLETE Result Date: 07/06/2024    ECHOCARDIOGRAM REPORT   Patient Name:   ELVERIA LAUDERBAUGH Date of Exam: 07/06/2024 Medical Rec #:  [id number]      Height:       65.0 in Accession #:    [id number]     Weight:       140.0 lb Date of Birth:  19-Feb-1996     BSA:          1.700 m  Patient Age:    27 years       BP:           120/79 mmHg Patient Gender: F              HR:           77 bpm. Exam Location:  Inpatient Procedure: 2D Echo, Color Doppler and Cardiac Doppler (Both Spectral and Color            Flow Doppler were utilized during procedure). Indications:   Stroke i63.9  History:       Patient has no prior history of Echocardiogram examinations.                Stroke.  Sonographer:   Koleen Popper RDCS Referring      [phone] ANASTASSIA DOUTOVA Phys:  Sonographer Comments: Image acquisition challenging due to uncooperative patient. IMPRESSIONS  1. Left ventricular ejection fraction, by estimation, is 60 to 65%. The left ventricle has normal function. The left ventricle has no regional wall motion abnormalities. Left ventricular diastolic parameters were normal.  2. Right ventricular systolic function is normal. The right ventricular size is normal. There is normal pulmonary artery systolic pressure.  3. The mitral valve is normal in structure. Mild mitral valve regurgitation. No evidence of mitral stenosis.  4. The aortic valve is tricuspid. Aortic valve regurgitation is not visualized. visually opens well but gradient not assessed.  5. The inferior vena cava is normal in size with greater than 50% respiratory variability, suggesting right atrial pressure of 3 mmHg. FINDINGS  Left Ventricle: Left ventricular ejection fraction, by estimation, is 60 to 65%. The left ventricle has normal function. The left ventricle has no regional wall motion abnormalities. The left ventricular internal cavity size was normal in size. There is  no left ventricular hypertrophy. Left ventricular diastolic parameters were normal. Right Ventricle: The right ventricular size is normal. No increase in right ventricular wall thickness. Right ventricular systolic function is normal. There is normal pulmonary artery systolic pressure. The tricuspid regurgitant velocity is 2.06 m/s, and  with an assumed right atrial  pressure of 3 mmHg, the estimated right ventricular systolic pressure is 20.0 mmHg. Left Atrium: Left atrial size was normal in size. Right Atrium: Right atrial  size was normal in size. Pericardium: There is no evidence of pericardial effusion. Mitral Valve: The mitral valve is normal in structure. Mild mitral valve regurgitation. No evidence of mitral valve stenosis. Tricuspid Valve: The tricuspid valve is normal in structure. Tricuspid valve regurgitation is mild . No evidence of tricuspid stenosis. Aortic Valve: The aortic valve is tricuspid. Aortic valve regurgitation is not visualized. Visually opens well but gradient not assessed. Pulmonic Valve: The pulmonic valve was normal in structure. Pulmonic valve regurgitation is trivial. No evidence of pulmonic stenosis. Aorta: The aortic root is normal in size and structure. Venous: The inferior vena cava is normal in size with greater than 50% respiratory variability, suggesting right atrial pressure of 3 mmHg. IAS/Shunts: No atrial level shunt detected by color flow Doppler.  LEFT VENTRICLE PLAX 2D LVIDd:         5.00 cm   Diastology LVIDs:         3.60 cm   LV e' medial:    11.60 cm/s LV PW:         0.70 cm   LV E/e' medial:  6.2 LV IVS:        0.70 cm   LV e' lateral:   13.20 cm/s LVOT diam:     1.90 cm   LV E/e' lateral: 5.5 LV SV:         63 LV SV Index:   37 LVOT Area:     2.84 cm  RIGHT VENTRICLE             IVC RV S prime:     17.30 cm/s  IVC diam: 1.90 cm TAPSE (M-mode): 2.9 cm LEFT ATRIUM             Index        RIGHT ATRIUM           Index LA diam:        3.10 cm 1.82 cm/m   RA Area:     11.60 cm LA Vol (A2C):   44.9 ml 26.41 ml/m  RA Volume:   25.10 ml  14.77 ml/m LA Vol (A4C):   20.6 ml 12.12 ml/m LA Biplane Vol: 30.0 ml 17.65 ml/m  AORTIC VALVE             PULMONIC VALVE LVOT Vmax:   115.00 cm/s PR End Diast Vel: 1.51 msec LVOT Vmean:  73.300 cm/s LVOT VTI:    0.221 m  AORTA Ao Root diam: 2.80 cm Ao Asc diam:  2.70 cm MITRAL VALVE                TRICUSPID VALVE MV Area (PHT): 3.68 cm    TR Peak grad:   17.0 mmHg MV Decel Time: 206 msec    TR Vmax:        206.00 cm/s MR Peak grad: 24.2 mmHg MR Vmax:      246.00 cm/s  SHUNTS MV E velocity: 72.40 cm/s  Systemic VTI:  0.22 m MV A velocity: 86.50 cm/s  Systemic Diam: 1.90 cm MV E/A ratio:  0.84 Franck Azobou Tonleu Electronically signed by Joelle Cedars Tonleu Signature Date/Time: 07/06/2024/11:37:42 AM    Final    CT ANGIO HEAD NECK W WO CM W PERF (CODE STROKE) Result Date: 07/05/2024 EXAM: CTA Head and Neck with Perfusion 07/05/2024 08:55:08 PM TECHNIQUE: CTA of the head and neck was performed with the administration of intravenous contrast. 3D postprocessing with multiplanar reconstructions and MIPs was performed to evaluate the vascular anatomy.  Automated exposure control, iterative reconstruction, and/or weight based adjustment of the mA/kV was utilized to reduce the radiation dose to as low as reasonably achievable. COMPARISON: 07/05/2024 head CT CLINICAL HISTORY: Neuro deficit, acute, stroke suspected; ataxia, slurred speech, new small cerebellar stroke on CTH. Triage notes: seizure activity stated by her roommate. Pt described as overly emotional, was rolling around and throwing her hands around, keeps writing in a diary she has. Did not hit her head, no oral injury, no loss of urine. A\T\Ox4. VS stable. FINDINGS: CTA NECK: AORTIC ARCH AND ARCH VESSELS: No dissection or arterial injury. No significant stenosis of the brachiocephalic or subclavian arteries. CERVICAL CAROTID ARTERIES: No dissection, arterial injury, or hemodynamically significant stenosis by NASCET criteria. CERVICAL VERTEBRAL ARTERIES: No opacification of the left vertebral artery distal V1 and proximal V2 segments. There is reconstitution at the distal V2 segment but enhancement remains asymmetrically decreased throughout the remainder of the course of the vertebral artery. The right vertebral artery is normal. No proximal occlusion  visualized of the inferior cerebellar arteries. LUNGS AND MEDIASTINUM: Unremarkable. SOFT TISSUES: No acute abnormality. BONES: No acute abnormality. CTA HEAD: ANTERIOR CIRCULATION: No significant stenosis of the internal carotid arteries. No significant stenosis of the anterior cerebral arteries. No significant stenosis of the middle cerebral arteries. No aneurysm. POSTERIOR CIRCULATION: No significant stenosis of the posterior cerebral arteries. No significant stenosis of the basilar artery. No significant stenosis of the vertebral arteries. No aneurysm. OTHER: No dural venous sinus thrombosis on this non-dedicated study. CT PERFUSION: Fusion imaging shows a 13 mL region of ischemia affecting both cerebellar hemispheres but worse on the left. No core infarct. EXAM QUALITY: Exam quality is adequate with diagnostic perfusion maps. No significant motion artifact. Appropriate arterial inflow and venous outflow curves. CORE INFARCT (CBF<30% volume): 0 mL TOTAL HYPOPERFUSION (Tmax>6s volume): 13 mL PENUMBRA: Mismatch volume: 13 mL Mismatch ratio: Not applicable Location: Cerebellar hemispheres, worse on the left. IMPRESSION: 1. Proximal left vertebral artery dissection. Distal v2 segment reconstitution but with attenuated enhancement relative to the contralateral side along the remainder of its course. 2. A 13 mL region of ischemia affecting both cerebellar hemispheres, worse on the left, with no core infarct. Findings discussed with Dr. Caron Salt at 09:05 pm on 07/05/2024 Electronically signed by: Franky Stanford MD 07/05/2024 09:07 PM EDT RP Workstation: [id number]   CT Head Wo Contrast Result Date: 07/05/2024 EXAM: CT HEAD WITHOUT CONTRAST 07/05/2024 06:33:51 PM TECHNIQUE: CT of the head was performed without the administration of intravenous contrast. Automated exposure control, iterative reconstruction, and/or weight based adjustment of the mA/kV was utilized to reduce the radiation dose to as low as reasonably  achievable. COMPARISON: 08/24/2019 CLINICAL HISTORY: Seizure, new-onset, no history of trauma. Per chart: Pt BIB ems for seizure activity stated by her roommate. Pt described as overly emotional, was rolling around and throwing her hands around, keeps writing in a diary she has. Did not hit her head, no oral injury, no loss of urine. FINDINGS: BRAIN AND VENTRICLES: No acute hemorrhage. No evidence of acute infarct. No hydrocephalus. No extra-axial collection. No mass effect or midline shift. Old right cerebellar infarct but new since the prior study. ORBITS: No acute abnormality. SINUSES: No acute abnormality. SOFT TISSUES AND SKULL: No acute soft tissue abnormality. No skull fracture. IMPRESSION: 1. No acute intracranial abnormality. 2. New right cerebellar infarct since the prior study (2020), but likely chronic. Electronically signed by: Franky Stanford MD 07/05/2024 06:49 PM EDT RP Workstation: [id number]   DG Chest Portable 1  View Result Date: 07/05/2024 EXAM: 1 VIEW XRAY OF THE CHEST 07/05/2024 05:36:00 PM COMPARISON: 12/31/2017 CLINICAL HISTORY: ams. Per chart: Pt BIB ems for seizure activity stated by her roommate. Pt described as overly emotional, was rolling around and throwing her hands around, keeps writing in a diary she has. Did not hit her head, no oral injury, no loss of urine. A\T\Ox4. VS ; stable FINDINGS: LUNGS AND PLEURA: Low lung volumes. No focal pulmonary opacity. No pulmonary edema. No pleural effusion. No pneumothorax. HEART AND MEDIASTINUM: No acute abnormality of the cardiac and mediastinal silhouettes. BONES AND SOFT TISSUES: No acute osseous abnormality. IMPRESSION: 1. No acute process. 2. Low lung volumes. Electronically signed by: Franky Stanford MD 07/05/2024 06:47 PM EDT RP Workstation: [id number]    Pending Labs Unresulted Labs (From admission, onward)     Start     Ordered   07/06/24 0145  Vitamin B1  Once,   R        07/06/24 0145            Vitals/Pain Today's Vitals    07/06/24 0700 07/06/24 0800 07/06/24 0909 07/06/24 1118  BP: 120/79 124/83  126/81  Pulse:  72  88  Resp: 17 17  11   Temp:   98.6 F (37 C) 98.7 F (37.1 C)  TempSrc:   Oral Oral  SpO2:  100%  99%    Isolation Precautions No active isolations  Medications Medications   stroke: early stages of recovery book (has no administration in time range)  0.9 %  sodium chloride  infusion ( Intravenous New Bag/Given 07/06/24 0618)  thiamine  (VITAMIN B1) injection 100 mg (100 mg Intravenous Given 07/06/24 0107)  ondansetron  (ZOFRAN ) injection 4 mg (4 mg Intravenous Given 07/06/24 0106)  aspirin  EC tablet 81 mg (has no administration in time range)  potassium PHOSPHATE  15 mmol in dextrose 5 % 250 mL infusion (15 mmol Intravenous New Bag/Given 07/06/24 0814)  LORazepam  (ATIVAN ) injection 1 mg (has no administration in time range)  iohexol  (OMNIPAQUE ) 350 MG/ML injection 100 mL (100 mLs Intravenous Contrast Given 07/05/24 2032)  potassium chloride  10 mEq in 100 mL IVPB (0 mEq Intravenous Stopped 07/06/24 0721)  0.9 %  sodium chloride  infusion (0 mLs Intravenous Stopped 07/06/24 9380)    Mobility      Focused Assessments    R Recommendations: See Admitting Provider Note  Report given to:   Additional Notes:

## 2024-07-06 NOTE — ED Notes (Signed)
Report has been given

## 2024-07-06 NOTE — ED Notes (Signed)
 Pt visitor brought in food without nurse awareness. Pt was finishing her food as Charity fundraiser was coming in room. RN stated no food was allowed at the moment. Pt showed no signs of distress when eating.

## 2024-07-06 NOTE — Progress Notes (Signed)
 Pt is at MRI

## 2024-07-06 NOTE — H&P (View-Only) (Signed)
 Stroke Neurology Consultation Note  Consult Requested by: Dr. Perri  Reason for Consult: stroke  Consult Date: 07/06/24   The history was obtained from the pt.  During history and examination, all items were able to obtain unless otherwise noted.  History of Present Illness:  Cassidy Bruce is a 28 y.o. African American female with PMH of MDD, ADHD, depression admitted for dizziness, slurry speech, headache and imbalance on walking.  Per patient, she went to gym on Monday, doing weightlifting as usual, no specific movements.  Tuesday morning she woke up from sleep, started to have headache and neck pain, tolerable.  In the afternoon she had a spaghetti for lunch and then went to have a nap, on waking up from nap, she had acute onset vertigo, room spinning, not able to open eyes, slurred speech, hard to speak and not able to walk.  EMS was called and she was sent to ER for evaluation.  CT concerning for right cerebellar infarct.  CTA head and neck also showed left VA proximal occlusion concerning for dissection.  CTP showed bilateral cerebellum penumbra 13 cc, left more than right, no core infarct.  Patient was admitted for further stroke workup. She stated that her speech and imbalance seemed better today but headache continued. MRI showed b/l cerebellar infarct, L>R.  She denies smoking, alcohol or illicit drugs. But uses birth control shot in arm for birth control, she can not tell me the name.   LSN: yesterday morning TNK Given: No: outside window IR Thrombectomy? No, left VA occlusion but BA patent Modified Rankin Scale: 0-Completely asymptomatic and back to baseline post- stroke  Past Medical History:  Diagnosis Date   ADHD    Depression     History reviewed. No pertinent surgical history.  Family History  Problem Relation Age of Onset   Cancer Other    Diabetes Other    CAD Other    Healthy Mother     Social History:  reports that she has never smoked. She has never used  smokeless tobacco. She reports that she does not drink alcohol and does not use drugs.  Allergies: No Known Allergies  No current facility-administered medications on file prior to encounter.   Current Outpatient Medications on File Prior to Encounter  Medication Sig Dispense Refill   acetaminophen  (TYLENOL ) 500 MG tablet Take 500 mg by mouth every 6 (six) hours as needed.     fluconazole  (DIFLUCAN ) 150 MG tablet Take one tablet today and one tablet in 3 days if symptoms persist. (Patient not taking: Reported on 07/06/2024) 2 tablet 0    Review of Systems: A full ROS was attempted today and was able to be performed.  Systems assessed include - Constitutional, Eyes, HENT, Respiratory, Cardiovascular, Gastrointestinal, Genitourinary, Integument/breast, Hematologic/lymphatic, Musculoskeletal, Neurological, Behavioral/Psych, Endocrine, Allergic/Immunologic - with pertinent responses as per HPI.  Physical Examination: Temp:  [97.5 F (36.4 C)-99 F (37.2 C)] 99 F (37.2 C) (09/03 1622) Pulse Rate:  [72-88] 79 (09/03 1622) Resp:  [11-20] 16 (09/03 1622) BP: (106-139)/(62-105) 126/81 (09/03 1622) SpO2:  [94 %-100 %] 100 % (09/03 1622)  General - well nourished, well developed, in acute distress of HA.    Ophthalmologic - fundi not visualized due to noncooperation.    Cardiovascular - regular rhythm and rate  Neuro - awake, alert, eyes open, orientated to age, place, time. No aphasia, but moderate dysarthria and scanning speech, following all simple commands. Able to name and repeat in dysarthria voice. No gaze palsy, tracking  bilaterally, visual field full, left gaze nystagmus with direction to the left. No facial droop. Tongue midline. Bilateral UEs 5/5, no drift. Bilaterally LEs 5/5, no drift. Sensation symmetrical bilaterally, b/l FTN and heel-to-shin significant ataxia, gait not tested.    Data Reviewed: ECHOCARDIOGRAM COMPLETE Result Date: 07/06/2024    ECHOCARDIOGRAM REPORT   Patient  Name:   Cassidy Bruce Date of Exam: 07/06/2024 Medical Rec #:  [id number]      Height:       65.0 in Accession #:    [id number]     Weight:       140.0 lb Date of Birth:  [birth year]     BSA:          1.700 m Patient Age:    27 years       BP:           120/79 mmHg Patient Gender: F              HR:           77 bpm. Exam Location:  Inpatient Procedure: 2D Echo, Color Doppler and Cardiac Doppler (Both Spectral and Color            Flow Doppler were utilized during procedure). Indications:   Stroke i63.9  History:       Patient has no prior history of Echocardiogram examinations.                Stroke.  Sonographer:   Koleen Popper RDCS Referring      [phone] ANASTASSIA DOUTOVA Phys:  Sonographer Comments: Image acquisition challenging due to uncooperative patient. IMPRESSIONS  1. Left ventricular ejection fraction, by estimation, is 60 to 65%. The left ventricle has normal function. The left ventricle has no regional wall motion abnormalities. Left ventricular diastolic parameters were normal.  2. Right ventricular systolic function is normal. The right ventricular size is normal. There is normal pulmonary artery systolic pressure.  3. The mitral valve is normal in structure. Mild mitral valve regurgitation. No evidence of mitral stenosis.  4. The aortic valve is tricuspid. Aortic valve regurgitation is not visualized. visually opens well but gradient not assessed.  5. The inferior vena cava is normal in size with greater than 50% respiratory variability, suggesting right atrial pressure of 3 mmHg. FINDINGS  Left Ventricle: Left ventricular ejection fraction, by estimation, is 60 to 65%. The left ventricle has normal function. The left ventricle has no regional wall motion abnormalities. The left ventricular internal cavity size was normal in size. There is  no left ventricular hypertrophy. Left ventricular diastolic parameters were normal. Right Ventricle: The right ventricular size is normal. No increase in right  ventricular wall thickness. Right ventricular systolic function is normal. There is normal pulmonary artery systolic pressure. The tricuspid regurgitant velocity is 2.06 m/s, and  with an assumed right atrial pressure of 3 mmHg, the estimated right ventricular systolic pressure is 20.0 mmHg. Left Atrium: Left atrial size was normal in size. Right Atrium: Right atrial size was normal in size. Pericardium: There is no evidence of pericardial effusion. Mitral Valve: The mitral valve is normal in structure. Mild mitral valve regurgitation. No evidence of mitral valve stenosis. Tricuspid Valve: The tricuspid valve is normal in structure. Tricuspid valve regurgitation is mild . No evidence of tricuspid stenosis. Aortic Valve: The aortic valve is tricuspid. Aortic valve regurgitation is not visualized. Visually opens well but gradient not assessed. Pulmonic Valve: The pulmonic valve was normal in structure. Pulmonic valve regurgitation is  trivial. No evidence of pulmonic stenosis. Aorta: The aortic root is normal in size and structure. Venous: The inferior vena cava is normal in size with greater than 50% respiratory variability, suggesting right atrial pressure of 3 mmHg. IAS/Shunts: No atrial level shunt detected by color flow Doppler.  LEFT VENTRICLE PLAX 2D LVIDd:         5.00 cm   Diastology LVIDs:         3.60 cm   LV e' medial:    11.60 cm/s LV PW:         0.70 cm   LV E/e' medial:  6.2 LV IVS:        0.70 cm   LV e' lateral:   13.20 cm/s LVOT diam:     1.90 cm   LV E/e' lateral: 5.5 LV SV:         63 LV SV Index:   37 LVOT Area:     2.84 cm  RIGHT VENTRICLE             IVC RV S prime:     17.30 cm/s  IVC diam: 1.90 cm TAPSE (M-mode): 2.9 cm LEFT ATRIUM             Index        RIGHT ATRIUM           Index LA diam:        3.10 cm 1.82 cm/m   RA Area:     11.60 cm LA Vol (A2C):   44.9 ml 26.41 ml/m  RA Volume:   25.10 ml  14.77 ml/m LA Vol (A4C):   20.6 ml 12.12 ml/m LA Biplane Vol: 30.0 ml 17.65 ml/m  AORTIC  VALVE             PULMONIC VALVE LVOT Vmax:   115.00 cm/s PR End Diast Vel: 1.51 msec LVOT Vmean:  73.300 cm/s LVOT VTI:    0.221 m  AORTA Ao Root diam: 2.80 cm Ao Asc diam:  2.70 cm MITRAL VALVE               TRICUSPID VALVE MV Area (PHT): 3.68 cm    TR Peak grad:   17.0 mmHg MV Decel Time: 206 msec    TR Vmax:        206.00 cm/s MR Peak grad: 24.2 mmHg MR Vmax:      246.00 cm/s  SHUNTS MV E velocity: 72.40 cm/s  Systemic VTI:  0.22 m MV A velocity: 86.50 cm/s  Systemic Diam: 1.90 cm MV E/A ratio:  0.84 Franck Azobou Tonleu Electronically signed by Joelle Cedars Tonleu Signature Date/Time: 07/06/2024/11:37:42 AM    Final    CT ANGIO HEAD NECK W WO CM W PERF (CODE STROKE) Result Date: 07/05/2024 EXAM: CTA Head and Neck with Perfusion 07/05/2024 08:55:08 PM TECHNIQUE: CTA of the head and neck was performed with the administration of intravenous contrast. 3D postprocessing with multiplanar reconstructions and MIPs was performed to evaluate the vascular anatomy. Automated exposure control, iterative reconstruction, and/or weight based adjustment of the mA/kV was utilized to reduce the radiation dose to as low as reasonably achievable. COMPARISON: 07/05/2024 head CT CLINICAL HISTORY: Neuro deficit, acute, stroke suspected; ataxia, slurred speech, new small cerebellar stroke on CTH. Triage notes: seizure activity stated by her roommate. Pt described as overly emotional, was rolling around and throwing her hands around, keeps writing in a diary she has. Did not hit her head, no oral injury, no loss of urine.  A\T\Ox4. VS stable. FINDINGS: CTA NECK: AORTIC ARCH AND ARCH VESSELS: No dissection or arterial injury. No significant stenosis of the brachiocephalic or subclavian arteries. CERVICAL CAROTID ARTERIES: No dissection, arterial injury, or hemodynamically significant stenosis by NASCET criteria. CERVICAL VERTEBRAL ARTERIES: No opacification of the left vertebral artery distal V1 and proximal V2 segments. There is  reconstitution at the distal V2 segment but enhancement remains asymmetrically decreased throughout the remainder of the course of the vertebral artery. The right vertebral artery is normal. No proximal occlusion visualized of the inferior cerebellar arteries. LUNGS AND MEDIASTINUM: Unremarkable. SOFT TISSUES: No acute abnormality. BONES: No acute abnormality. CTA HEAD: ANTERIOR CIRCULATION: No significant stenosis of the internal carotid arteries. No significant stenosis of the anterior cerebral arteries. No significant stenosis of the middle cerebral arteries. No aneurysm. POSTERIOR CIRCULATION: No significant stenosis of the posterior cerebral arteries. No significant stenosis of the basilar artery. No significant stenosis of the vertebral arteries. No aneurysm. OTHER: No dural venous sinus thrombosis on this non-dedicated study. CT PERFUSION: Fusion imaging shows a 13 mL region of ischemia affecting both cerebellar hemispheres but worse on the left. No core infarct. EXAM QUALITY: Exam quality is adequate with diagnostic perfusion maps. No significant motion artifact. Appropriate arterial inflow and venous outflow curves. CORE INFARCT (CBF<30% volume): 0 mL TOTAL HYPOPERFUSION (Tmax>6s volume): 13 mL PENUMBRA: Mismatch volume: 13 mL Mismatch ratio: Not applicable Location: Cerebellar hemispheres, worse on the left. IMPRESSION: 1. Proximal left vertebral artery dissection. Distal v2 segment reconstitution but with attenuated enhancement relative to the contralateral side along the remainder of its course. 2. A 13 mL region of ischemia affecting both cerebellar hemispheres, worse on the left, with no core infarct. Findings discussed with Dr. Caron Salt at 09:05 pm on 07/05/2024 Electronically signed by: Franky Stanford MD 07/05/2024 09:07 PM EDT RP Workstation: [id number]   CT Head Wo Contrast Result Date: 07/05/2024 EXAM: CT HEAD WITHOUT CONTRAST 07/05/2024 06:33:51 PM TECHNIQUE: CT of the head was performed  without the administration of intravenous contrast. Automated exposure control, iterative reconstruction, and/or weight based adjustment of the mA/kV was utilized to reduce the radiation dose to as low as reasonably achievable. COMPARISON: 08/24/2019 CLINICAL HISTORY: Seizure, new-onset, no history of trauma. Per chart: Pt BIB ems for seizure activity stated by her roommate. Pt described as overly emotional, was rolling around and throwing her hands around, keeps writing in a diary she has. Did not hit her head, no oral injury, no loss of urine. FINDINGS: BRAIN AND VENTRICLES: No acute hemorrhage. No evidence of acute infarct. No hydrocephalus. No extra-axial collection. No mass effect or midline shift. Old right cerebellar infarct but new since the prior study. ORBITS: No acute abnormality. SINUSES: No acute abnormality. SOFT TISSUES AND SKULL: No acute soft tissue abnormality. No skull fracture. IMPRESSION: 1. No acute intracranial abnormality. 2. New right cerebellar infarct since the prior study (2020), but likely chronic. Electronically signed by: Franky Stanford MD 07/05/2024 06:49 PM EDT RP Workstation: [id number]   DG Chest Portable 1 View Result Date: 07/05/2024 EXAM: 1 VIEW XRAY OF THE CHEST 07/05/2024 05:36:00 PM COMPARISON: 12/31/2017 CLINICAL HISTORY: ams. Per chart: Pt BIB ems for seizure activity stated by her roommate. Pt described as overly emotional, was rolling around and throwing her hands around, keeps writing in a diary she has. Did not hit her head, no oral injury, no loss of urine. A\T\Ox4. VS ; stable FINDINGS: LUNGS AND PLEURA: Low lung volumes. No focal pulmonary opacity. No pulmonary edema. No pleural effusion. No  pneumothorax. HEART AND MEDIASTINUM: No acute abnormality of the cardiac and mediastinal silhouettes. BONES AND SOFT TISSUES: No acute osseous abnormality. IMPRESSION: 1. No acute process. 2. Low lung volumes. Electronically signed by: Franky Stanford MD 07/05/2024 06:47 PM EDT RP  Workstation: [id number]    Assessment: 28 y.o. female PMH of MDD, ADHD, depression admitted for dizziness, slurry speech, headache and imbalance on walking.  Exam showed dysarthria, scanning speech, bilateral significant ataxia and left gaze nystagmus. CT concerning for right cerebellar infarct.  CTA head and neck also showed left VA proximal occlusion concerning for dissection.  CTP showed bilateral cerebellum penumbra 13 cc, left more than right, no core infarct.  MRI showed b/l cerebellar infarct, L>R.  Patient stroke could be from left VA dissection due to her workout with weightlifting, however part of right cerebellar infarct seem to belong to PICA territory which cannot be explained by left VA occlusion.  Will do further embolic workup including TEE,   On aspirin , Plavix  and Crestor .   Plan: Frequent neuro checks Telemetry monitoring TEE, TCD bubble study, LE venous Doppler, hypercoagulable workup. PT/OT/speech consult GI and DVT prophylaxis  Continue aspirin , Plavix  and Crestor  Stroke risk factor modification Discussed with Dr. Perri We will follow  Thank you for this consultation and allowing us  to participate in the care of this patient.  Ary Cummins, MD PhD Stroke Neurology 07/06/2024 6:42 PM

## 2024-07-06 NOTE — Progress Notes (Deleted)
  Echocardiogram 2D Echocardiogram has been performed.  Koleen KANDICE Popper, RDCS 07/06/2024, 10:28 AM

## 2024-07-06 NOTE — Progress Notes (Signed)
   [facility] has been requested to perform a transesophageal echocardiogram on Cassidy Bruce for stroke.  After careful review of history and examination, the risks and benefits of transesophageal echocardiogram have been explained including risks of esophageal damage, perforation (1:10,000 risk), bleeding, pharyngeal hematoma as well as other potential complications associated with anesthesia including aspiration, arrhythmia, respiratory failure and death. Alternatives to treatment were discussed, questions were answered. Patient is willing to proceed.   28 yo with MDD, ADHD and adjustment disorder admitted with slurry speech. CT showed new stroke. Hgb normal. Platelet 125.   Cassidy Bruce, [place] 07/06/2024 5:15 PM

## 2024-07-06 NOTE — ED Notes (Signed)
 [facility] called for transportation

## 2024-07-06 NOTE — Progress Notes (Signed)
  Echocardiogram 2D Echocardiogram has been performed.  Cassidy Bruce, RDCS 07/06/2024, 10:25 AM

## 2024-07-06 NOTE — Evaluation (Signed)
 SLP Cancellation Note  Patient Details Name: Cassidy Bruce MRN: [id number] DOB: [birth year]   Cancelled treatment:       Reason Eval/Treat Not Completed: Other (comment) (pt transferring to [name] ED personnel re: pt need for [facility] swallow screen - which was completed prior to pt transfer)  Messaged Md later in the day requesting diet order if he agreed as pt passed [place].  Thanks.   Madelin POUR, MS [facility] SLP Acute Rehab Services Office [phone]  [name] [name] 07/06/2024, 4:59 PM

## 2024-07-06 NOTE — ED Notes (Signed)
 Attempted USGIV with blood return and flushing. After securing and running infusions, pt began complaining of pain and appeared uncomfortable, gritting her teeth.

## 2024-07-06 NOTE — Progress Notes (Signed)
 OT Cancellation Note  Patient Details Name: Cassidy Bruce MRN: [id number] DOB: 1996-07-11   Cancelled Treatment:    Reason Eval/Treat Not Completed: Other (comment). OT orders received. Per chart, pt transferring to [facility].   Velva Molinari L. Andrina Locken, OTR/L  07/06/24, 1:37 PM

## 2024-07-06 NOTE — ED Notes (Signed)
 Pt stated she had to pee so I got bed pan under her and she did.  She then said she needed a tampon, but no signs of blood when she wiped.  I told her she was ok.  Pts phone rang and she talked to someone with very slurred speech.  She stated her friend would be back here soon.  She complained of her head hurting and she wants lights off.  She then fell back asleep.  Unable to get Ox sat, tech changing out probes

## 2024-07-06 NOTE — ED Notes (Signed)
 Pt repositioned for comfort

## 2024-07-07 ENCOUNTER — Encounter (HOSPITAL_COMMUNITY)
Admission: EM | Disposition: A | Discharge status: Rehabilitation Facility | Payer: Self-pay | Source: Home / Self Care | Attending: Family Medicine

## 2024-07-07 ENCOUNTER — Inpatient Hospital Stay (HOSPITAL_COMMUNITY): Payer: MEDICAID

## 2024-07-07 ENCOUNTER — Encounter (HOSPITAL_COMMUNITY): Payer: Self-pay | Admitting: Internal Medicine

## 2024-07-07 ENCOUNTER — Other Ambulatory Visit (HOSPITAL_COMMUNITY): Payer: Self-pay

## 2024-07-07 ENCOUNTER — Inpatient Hospital Stay (HOSPITAL_COMMUNITY): Payer: Self-pay

## 2024-07-07 DIAGNOSIS — R569 Unspecified convulsions: Secondary | ICD-10-CM

## 2024-07-07 DIAGNOSIS — I6389 Other cerebral infarction: Secondary | ICD-10-CM

## 2024-07-07 DIAGNOSIS — Q2112 Patent foramen ovale: Secondary | ICD-10-CM

## 2024-07-07 DIAGNOSIS — I361 Nonrheumatic tricuspid (valve) insufficiency: Secondary | ICD-10-CM

## 2024-07-07 DIAGNOSIS — I7774 Dissection of vertebral artery: Principal | ICD-10-CM

## 2024-07-07 DIAGNOSIS — F32A Depression, unspecified: Secondary | ICD-10-CM

## 2024-07-07 DIAGNOSIS — I679 Cerebrovascular disease, unspecified: Secondary | ICD-10-CM

## 2024-07-07 DIAGNOSIS — I639 Cerebral infarction, unspecified: Secondary | ICD-10-CM

## 2024-07-07 HISTORY — PX: TRANSESOPHAGEAL ECHOCARDIOGRAM (CATH LAB): EP1270

## 2024-07-07 LAB — COMPREHENSIVE METABOLIC PANEL WITH GFR
ALT: 48 U/L — ABNORMAL HIGH (ref 0–44)
AST: 33 U/L (ref 15–41)
Albumin: 3.3 g/dL — ABNORMAL LOW (ref 3.5–5.0)
Alkaline Phosphatase: 62 U/L (ref 38–126)
Anion gap: 9 (ref 5–15)
BUN: 8 mg/dL (ref 6–20)
CO2: 24 mmol/L (ref 22–32)
Calcium: 8.5 mg/dL — ABNORMAL LOW (ref 8.9–10.3)
Chloride: 110 mmol/L (ref 98–111)
Creatinine, Ser: 1.06 mg/dL — ABNORMAL HIGH (ref 0.44–1.00)
GFR, Estimated: >60 mL/min (ref >60)
Glucose, Bld: 105 mg/dL — ABNORMAL HIGH (ref 70–99)
Potassium: 4 mmol/L (ref 3.5–5.1)
Sodium: 143 mmol/L (ref 135–145)
Total Bilirubin: 0.5 mg/dL (ref 0.0–1.2)
Total Protein: 6.4 g/dL — ABNORMAL LOW (ref 6.5–8.1)

## 2024-07-07 LAB — CBC WITH DIFFERENTIAL/PLATELET
Abs Immature Granulocytes: 0.02 K/uL (ref 0.00–0.07)
Basophils Absolute: 0.1 K/uL (ref 0.0–0.1)
Basophils Relative: 1 %
Eosinophils Absolute: 0.1 K/uL (ref 0.0–0.5)
Eosinophils Relative: 1 %
HCT: 40.1 % (ref 36.0–46.0)
Hemoglobin: 13.1 g/dL (ref 12.0–15.0)
Immature Granulocytes: 0 %
Lymphocytes Relative: 17 %
Lymphs Abs: 1.8 K/uL (ref 0.7–4.0)
MCH: 25.8 pg — ABNORMAL LOW (ref 26.0–34.0)
MCHC: 32.7 g/dL (ref 30.0–36.0)
MCV: 78.9 fL — ABNORMAL LOW (ref 80.0–100.0)
Monocytes Absolute: 1 K/uL (ref 0.1–1.0)
Monocytes Relative: 10 %
Neutro Abs: 7.8 K/uL — ABNORMAL HIGH (ref 1.7–7.7)
Neutrophils Relative %: 71 %
Platelets: 156 K/uL (ref 150–400)
RBC: 5.08 MIL/uL (ref 3.87–5.11)
RDW: 15 % (ref 11.5–15.5)
WBC: 10.8 K/uL — ABNORMAL HIGH (ref 4.0–10.5)
nRBC: 0 % (ref 0.0–0.2)

## 2024-07-07 LAB — MAGNESIUM: Magnesium: 1.9 mg/dL (ref 1.7–2.4)

## 2024-07-07 LAB — PHOSPHORUS: Phosphorus: 3 mg/dL (ref 2.5–4.6)

## 2024-07-07 LAB — ECHO TEE

## 2024-07-07 SURGERY — TRANSESOPHAGEAL ECHOCARDIOGRAM (TEE) (CATHLAB)
Anesthesia: Monitor Anesthesia Care

## 2024-07-07 MED ORDER — DIPHENHYDRAMINE HCL 25 MG PO CAPS
25.0000 mg | ORAL_CAPSULE | Freq: Once | ORAL | Status: AC
  Administered 2024-07-07: 25 mg via ORAL
  Filled 2024-07-07: qty 1

## 2024-07-07 MED ORDER — LACTATED RINGERS IV BOLUS
250.0000 mL | Freq: Once | INTRAVENOUS | Status: AC
  Administered 2024-07-07: 250 mL via INTRAVENOUS

## 2024-07-07 MED ORDER — PROPOFOL 10 MG/ML IV BOLUS
INTRAVENOUS | Status: DC | PRN
  Administered 2024-07-07: 30 mg via INTRAVENOUS
  Administered 2024-07-07: 20 mg via INTRAVENOUS
  Administered 2024-07-07: 50 mg via INTRAVENOUS
  Administered 2024-07-07: 20 mg via INTRAVENOUS

## 2024-07-07 MED ORDER — ACETAMINOPHEN 500 MG PO TABS
1000.0000 mg | ORAL_TABLET | Freq: Three times a day (TID) | ORAL | Status: DC
  Administered 2024-07-07 – 2024-07-12 (×9): 1000 mg via ORAL
  Filled 2024-07-07 (×14): qty 2

## 2024-07-07 MED ORDER — LIDOCAINE 2% (20 MG/ML) 5 ML SYRINGE
INTRAMUSCULAR | Status: DC | PRN
  Administered 2024-07-07: 40 mg via INTRAVENOUS

## 2024-07-07 MED ORDER — OXYCODONE HCL 5 MG PO TABS
5.0000 mg | ORAL_TABLET | ORAL | Status: DC | PRN
  Administered 2024-07-07 – 2024-07-09 (×4): 5 mg via ORAL
  Filled 2024-07-07 (×5): qty 1

## 2024-07-07 MED ORDER — PROPOFOL 500 MG/50ML IV EMUL
INTRAVENOUS | Status: DC | PRN
  Administered 2024-07-07: 150 ug/kg/min via INTRAVENOUS

## 2024-07-07 MED ORDER — PROCHLORPERAZINE EDISYLATE 10 MG/2ML IJ SOLN
10.0000 mg | Freq: Once | INTRAMUSCULAR | Status: AC
  Administered 2024-07-07: 10 mg via INTRAVENOUS
  Filled 2024-07-07: qty 2
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
## 2024-07-07 NOTE — Progress Notes (Signed)
 Inpatient Rehab Admissions Coordinator Note:   Per therapy recommendations patient was screened for CIR candidacy by Reche FORBES Lowers, PT. At this time, pt appears to be a potential candidate for CIR. I will place an order for rehab consult for full assessment, per our protocol.  Please contact me any with questions.[name] Reche Lowers, PT, DPT [phone] 07/07/24 3:41 PM

## 2024-07-07 NOTE — Progress Notes (Signed)
 Bilateral lower extremity venous duplex has been completed. Preliminary results can be found in CV Proc through chart review.   07/07/24 4:23 PM Cathlyn Collet RVT

## 2024-07-07 NOTE — Progress Notes (Signed)
 SLP Cancellation Note  Patient Details Name: Cassidy Bruce MRN: [id number] DOB: 1996-01-26   Cancelled treatment:       Reason Eval/Treat Not Completed: Patient at procedure or test/unavailable    Leita SAILOR., M.A. CCC-SLP Acute Rehabilitation Services Office: [phone]  Secure chat preferred  07/07/2024, 2:27 PM

## 2024-07-07 NOTE — Interval H&P Note (Signed)
 History and Physical Interval Note:  07/07/2024 1:31 PM  Cassidy Bruce  has presented today for surgery, with the diagnosis of CVA.  The various methods of treatment have been discussed with the patient and family. After consideration of risks, benefits and other options for treatment, the patient has consented to  Procedure(s): TRANSESOPHAGEAL ECHOCARDIOGRAM (N/A) as a surgical intervention.  The patient's history has been reviewed, patient examined, no change in status, stable for surgery.  I have reviewed the patient's chart and labs.  Questions were answered to the patient's satisfaction.     Nichola Warren Lonni

## 2024-07-07 NOTE — TOC CAGE-AID Note (Signed)
 Transition of Care [facility]) - CAGE-AID Screening   Patient Details  Name: Cassidy Bruce MRN: [id number] Date of Birth: 1996/10/09  Transition of Care [facility]) CM/SW Contact:    Anikka Marsan E Sundi Slevin, LCSW Phone Number: 07/07/2024, 10:28 AM   Clinical Narrative: No SA noted.   CAGE-AID Screening:    Have You Ever Felt You Ought to Cut Down on Your Drinking or Drug Use?: No Have People Annoyed You By Critizing Your Drinking Or Drug Use?: No Have You Felt Bad Or Guilty About Your Drinking Or Drug Use?: No Have You Ever Had a Drink or Used Drugs First Thing In The Morning to Steady Your Nerves or to Get Rid of a Hangover?: No CAGE-AID Score: 0  Substance Abuse Education Offered: No

## 2024-07-07 NOTE — Progress Notes (Signed)
 PROGRESS NOTE    Cassidy Bruce  FMW:[id number] DOB: 07/10/1996 DOA: 07/05/2024 PCP: Pcp, No  Chief Complaint  Patient presents with   Seizures    Brief Narrative:   Mrs. Difatta is Cassidy Bruce 28 year old with Deuntae Kocsis past medical history significant for ADHD and depression who presented to the hospital with seizure-like activity and has been having intermittent headaches. Yesterday around 3:30 PM she became unresponsive was noted to be shaking all over and would not follow commands or answer questions. She was brought to the ED for further evaluation and acetaminophen  and salicylate levels were negative and alcohol was negative. She appeared ataxic and CTA of the head neck was done and showed Cassidy Bruce proximal left vertebral artery dissection. Neuro was consulted and recommended stroke workup and transferring the the patient to [facility] for further evaluation. He continues to complain of severe headache and continues to have incoordination, imbalance and slurred speech.   Assessment & Plan:   Principal Problem:   Stroke [facility]) Active Problems:   Hypokalemia   PFO (patent foramen ovale)  Acute Stroke MRI with acute infarcts within the cerebellar vermis and bilateral cerebellar hemispheres.  Large acute infarcts present within the superior cerebellar artery territories bilaterally.  Posterior fossa mass effect without cerebellar tonsillar herniation or evidence of obstructive hydrocephalus.  Petechial hemorrhage within the cerebellar vermis and suerior R cerebellar hemisphere. CTA head/neck with proximal L vertebral artery dissection, 13 ml region of ischemia affecting both cerebellar hemispheres, worse on L TEE with PFO  LE US  negative for DVT Echo with EF 60-65%, no RWMA LDL 104, A1c 5.4 ANA pending, cardiolipin ab pending, serum homecysteine, beta 2 glycoprotein ab, lupus anticoagulant pending.  Pending vitamin b1.  Appreciate neurology assistance - bilateral cerebellar infarct, embolic vs L VA  occlusion/dissection - recommending DAPT x3 months, then ASA alone.  Crestor  20 mg daily (stop if pregnant or planning pregnancy).  Dyslipidemia Statin   Acute Kidney Injury Very mild, trend  Elevated LFT's Noted, workup further as indicated  Leukocytosis Noted, mild  Follow   Microcytosis Iron studies  Mood Disorder Will discuss medication for depression, will consider later in her course Denies SI  Complex Family Dynamics At this point in time, she's deferring to her friend Zenobia  Birth Control  Nexplanon  in place (L arm) - appears it was placed 08/2020  Homelessness TOC     DVT prophylaxis: SCD Code Status: full Family Communication: mother, aunt, friend Disposition:   Status is: Inpatient Remains inpatient appropriate because: need for continued inpatient care   Consultants:  Neurology cards  Procedures:  LE US  Summary:  RIGHT:      - There is no evidence of deep vein thrombosis in the lower extremity.    - No cystic structure found in the popliteal fossa.    LEFT:      - There is no evidence of deep vein thrombosis in the lower extremity.    - No cystic structure found in the popliteal fossa.   TEE IMPRESSIONS     1. Left ventricular ejection fraction, by estimation, is 60 to 65%. The  left ventricle has normal function.   2. Right ventricular systolic function is normal. The right ventricular  size is normal.   3. No left atrial/left atrial appendage thrombus was detected. The LAA  emptying velocity was 64 cm/s.   4. Distal anterior mitral leaflet tip appears to have minimal prolapse of  fibrinous appearing structures. The mitral valve is abnormal. Trivial  mitral valve regurgitation.  No evidence of mitral stenosis.   5. The aortic valve is tricuspid. Aortic valve regurgitation is not  visualized. No aortic stenosis is present.   6. Evidence of atrial level shunting detected by color flow Doppler.  Agitated saline contrast  bubble study was positive with shunting observed  within 3-6 cardiac cycles suggestive of interatrial shunt. There is Shanikia Kernodle  small patent foramen ovale with  bidirectional shunting across atrial septum.   7. 3D performed of the mitral valve and 3D performed of the atrial septum  and demonstrates Distal anterior mitral leaflet tip appears to have  minimal prolapse of fibrinous appearing structures. PFO visualized.   Conclusion(s)/Recommendation(s): PFO seen by color Doppler (left to right  shunt). Agitated saline at rest was late positive for shunt, but agitated  saline with abdominal pressure (Valsalva) was rapidly positive, consistent  with right to left intra-atrial   shunt.   Echo IMPRESSIONS     1. Left ventricular ejection fraction, by estimation, is 60 to 65%. The  left ventricle has normal function. The left ventricle has no regional  wall motion abnormalities. Left ventricular diastolic parameters were  normal.   2. Right ventricular systolic function is normal. The right ventricular  size is normal. There is normal pulmonary artery systolic pressure.   3. The mitral valve is normal in structure. Mild mitral valve  regurgitation. No evidence of mitral stenosis.   4. The aortic valve is tricuspid. Aortic valve regurgitation is not  visualized. visually opens well but gradient not assessed.   5. The inferior vena cava is normal in size with greater than 50%  respiratory variability, suggesting right atrial pressure of 3 mmHg.   Antimicrobials:  Anti-infectives (From admission, onward)    None       Subjective: No new complaints  Objective: Vitals:   07/07/24 1450 07/07/24 1455 07/07/24 1500 07/07/24 1517  BP: 120/70  121/69 (!) 92/47  Pulse: 72 65 68 65  Resp: 19 18 18 18   Temp:    98.5 F (36.9 C)  TempSrc:    Oral  SpO2: 100% 100% 100% 100%    Intake/Output Summary (Last 24 hours) at 07/07/2024 1908 Last data filed at 07/07/2024 1600 Gross per 24 hour  Intake  2467.65 ml  Output 200 ml  Net 2267.65 ml   There were no vitals filed for this visit.  Examination:  General exam: Appears calm and comfortable  Respiratory system: unlabored Cardiovascular system: RRR Gastrointestinal system: Abdomen is nondistended, soft and nontender Central nervous system: Alert.  Dysarthria.  4/5 R sided strength.  Ataxia with bilateral FNF.   Extremities: no LEE   Data Reviewed: I have personally reviewed following labs and imaging studies  CBC: Recent Labs  Lab 07/05/24 1653 07/06/24 0625 07/07/24 0317  WBC 7.6 12.0* 10.8*  NEUTROABS 5.2  --  7.8*  HGB 13.3 12.8 13.1  HCT 40.1 39.4 40.1  MCV 77.7* 77.9* 78.9*  PLT 138* 125* 156    Basic Metabolic Panel: Recent Labs  Lab 07/05/24 1653 07/06/24 0625 07/07/24 0317  NA 142 139 143  K 3.2* 4.6 4.0  CL 107 106 110  CO2 21* 21* 24  GLUCOSE 123* 90 105*  BUN 9 8 8   CREATININE 0.79 0.70 1.06*  CALCIUM  9.0 9.2 8.5*  MG 2.1  --  1.9  PHOS 2.2*  --  3.0    GFR: CrCl cannot be calculated (Unknown ideal weight.).  Liver Function Tests: Recent Labs  Lab 07/05/24 1653 07/06/24 9374  07/07/24 0317  AST 38 36 33  ALT 58* 60* 48*  ALKPHOS 78 77 62  BILITOT 0.5 0.4 0.5  PROT 7.1 6.9 6.4*  ALBUMIN 4.3 4.1 3.3*    CBG: Recent Labs  Lab 07/05/24 1705  GLUCAP 97     No results found for this or any previous visit (from the past 240 hours).       Radiology Studies: ECHO TEE Result Date: 07/07/2024    TRANSESOPHOGEAL ECHO REPORT   Patient Name:   JAMESINA GAUGH Date of Exam: 07/07/2024 Medical Rec #:  [id number]      Height:       65.0 in Accession #:    [id number]     Weight:       140.0 lb Date of Birth:  [birth year]     BSA:          1.700 m Patient Age:    27 years       BP:           128/80 mmHg Patient Gender: F              HR:           111 bpm. Exam Location:  Inpatient Procedure: Transesophageal Echo, 3D Echo, Cardiac Doppler, Color Doppler and            Saline Contrast Bubble  Study (Both Spectral and Color Flow Doppler            were utilized during procedure). Indications:     Stroke i63.9  History:         Patient has prior history of Echocardiogram examinations, most                  recent 07/06/2024.  Sonographer:     Damien Senior RDCS Referring Phys:  [id number] HAO MENG Diagnosing Phys: Cassidy Bruckner MD PROCEDURE: After discussion of the risks and benefits of Lynde Ludwig TEE, an informed consent was obtained from the patient. The transesophogeal probe was passed without difficulty through the esophogus of the patient. Sedation performed by different physician. The patient was monitored while under deep sedation. Anesthestetic sedation was provided intravenously by Anesthesiology: 380mg  of Propofol , 40mg  of Lidocaine . Image quality was good. The patient developed no complications during the procedure.  IMPRESSIONS  1. Left ventricular ejection fraction, by estimation, is 60 to 65%. The left ventricle has normal function.  2. Right ventricular systolic function is normal. The right ventricular size is normal.  3. No left atrial/left atrial appendage thrombus was detected. The LAA emptying velocity was 64 cm/s.  4. Distal anterior mitral leaflet tip appears to have minimal prolapse of fibrinous appearing structures. The mitral valve is abnormal. Trivial mitral valve regurgitation. No evidence of mitral stenosis.  5. The aortic valve is tricuspid. Aortic valve regurgitation is not visualized. No aortic stenosis is present.  6. Evidence of atrial level shunting detected by color flow Doppler. Agitated saline contrast bubble study was positive with shunting observed within 3-6 cardiac cycles suggestive of interatrial shunt. There is Cassidy Bruce small patent foramen ovale with bidirectional shunting across atrial septum.  7. 3D performed of the mitral valve and 3D performed of the atrial septum and demonstrates Distal anterior mitral leaflet tip appears to have minimal prolapse of fibrinous appearing  structures. PFO visualized. Conclusion(s)/Recommendation(s): PFO seen by color Doppler (left to right shunt). Agitated saline at rest was late positive for shunt, but agitated saline with abdominal pressure (Valsalva) was rapidly positive, consistent with  right to left intra-atrial  shunt. FINDINGS  Left Ventricle: Left ventricular ejection fraction, by estimation, is 60 to 65%. The left ventricle has normal function. The left ventricular internal cavity size was normal in size. Right Ventricle: The right ventricular size is normal. No increase in right ventricular wall thickness. Right ventricular systolic function is normal. Left Atrium: Left atrial size was normal in size. No left atrial/left atrial appendage thrombus was detected. The LAA emptying velocity was 64 cm/s. Right Atrium: Right atrial size was normal in size. Prominent Chiari network. Pericardium: There is no evidence of pericardial effusion. Mitral Valve: Distal anterior mitral leaflet tip appears to have minimal prolapse of fibrinous appearing structures. The mitral valve is abnormal. Trivial mitral valve regurgitation. No evidence of mitral valve stenosis. There is no evidence of mitral valve vegetation. Tricuspid Valve: The tricuspid valve is normal in structure. Tricuspid valve regurgitation is mild . No evidence of tricuspid stenosis. There is no evidence of tricuspid valve vegetation. Aortic Valve: The aortic valve is tricuspid. Aortic valve regurgitation is not visualized. No aortic stenosis is present. There is no evidence of aortic valve vegetation. Pulmonic Valve: The pulmonic valve was normal in structure. Pulmonic valve regurgitation is trivial. There is no evidence of pulmonic valve vegetation. Aorta: The aortic root and ascending aorta are structurally normal, with no evidence of dilitation. IAS/Shunts: Evidence of atrial level shunting detected by color flow Doppler. Agitated saline contrast was given intravenously to evaluate for  intracardiac shunting. Agitated saline contrast bubble study was positive with shunting observed within 3-6 cardiac cycles suggestive of interatrial shunt. There is no evidence of Cinch Ormond patent foramen ovale. Roslynn Holte small patent foramen ovale is detected with bidirectional shunting across atrial septum. Additional Comments: Spectral Doppler performed. Cassidy Bruckner MD Electronically signed by Cassidy Bruckner MD Signature Date/Time: 07/07/2024/6:59:37 PM    Final    VAS US  LOWER EXTREMITY VENOUS (DVT) Result Date: 07/07/2024  Lower Venous DVT Study Patient Name:  NICOLLETTE WILHELMI  Date of Exam:   07/07/2024 Medical Rec #: [id number]       Accession #:    [id number] Date of Birth: 07-Jan-1996      Patient Gender: F Patient Age:   31 years Exam Location:  [facility] Procedure:      VAS US  LOWER EXTREMITY VENOUS (DVT) Referring Phys: ARY XU --------------------------------------------------------------------------------  Indications: Stroke.  Risk Factors: None identified. Comparison Study: No prior studies. Performing Technologist: Cordella COLLET RVT  Examination Guidelines: Mandie Crabbe complete evaluation includes B-mode imaging, spectral Doppler, color Doppler, and power Doppler as needed of all accessible portions of each vessel. Bilateral testing is considered an integral part of Renaye Janicki complete examination. Limited examinations for reoccurring indications may be performed as noted. The reflux portion of the exam is performed with the patient in reverse Trendelenburg.  +---------+---------------+---------+-----------+----------+--------------+ RIGHT    CompressibilityPhasicitySpontaneityPropertiesThrombus Aging +---------+---------------+---------+-----------+----------+--------------+ CFV      Full           Yes      Yes                                 +---------+---------------+---------+-----------+----------+--------------+ SFJ      Full                                                         +---------+---------------+---------+-----------+----------+--------------+  FV Prox  Full                                                        +---------+---------------+---------+-----------+----------+--------------+ FV Mid   Full                                                        +---------+---------------+---------+-----------+----------+--------------+ FV DistalFull                                                        +---------+---------------+---------+-----------+----------+--------------+ PFV      Full                                                        +---------+---------------+---------+-----------+----------+--------------+ POP      Full           Yes      Yes                                 +---------+---------------+---------+-----------+----------+--------------+ PTV      Full                                                        +---------+---------------+---------+-----------+----------+--------------+ PERO     Full                                                        +---------+---------------+---------+-----------+----------+--------------+   +---------+---------------+---------+-----------+----------+--------------+ LEFT     CompressibilityPhasicitySpontaneityPropertiesThrombus Aging +---------+---------------+---------+-----------+----------+--------------+ CFV      Full           Yes      Yes                                 +---------+---------------+---------+-----------+----------+--------------+ SFJ      Full                                                        +---------+---------------+---------+-----------+----------+--------------+ FV Prox  Full                                                        +---------+---------------+---------+-----------+----------+--------------+  FV Mid   Full                                                         +---------+---------------+---------+-----------+----------+--------------+ FV DistalFull                                                        +---------+---------------+---------+-----------+----------+--------------+ PFV      Full                                                        +---------+---------------+---------+-----------+----------+--------------+ POP      Full           Yes      Yes                                 +---------+---------------+---------+-----------+----------+--------------+ PTV      Full                                                        +---------+---------------+---------+-----------+----------+--------------+ PERO     Full                                                        +---------+---------------+---------+-----------+----------+--------------+     Summary: RIGHT: - There is no evidence of deep vein thrombosis in the lower extremity.  - No cystic structure found in the popliteal fossa.  LEFT: - There is no evidence of deep vein thrombosis in the lower extremity.  - No cystic structure found in the popliteal fossa.  *See table(s) above for measurements and observations. Electronically signed by Lonni Gaskins MD on 07/07/2024 at 5:40:37 PM.    Final    EP STUDY Result Date: 07/07/2024 See surgical note for result.  EEG adult Result Date: 07/07/2024 Shelton Arlin KIDD, MD     07/07/2024  8:49 AM Patient Name: Suhey Radford MRN: [id number] Epilepsy Attending: Arlin KIDD Shelton Referring Physician/Provider: Doutova, Anastassia, MD Date: 07/07/2024 Duration: 23.24 mins Patient history: 28yo F with seizure like activity. EEG to evaluate for seizure Level of alertness: Awake AEDs during EEG study: None Technical aspects: This EEG study was done with scalp electrodes positioned according to the 10-20 International system of electrode placement. Electrical activity was reviewed with band pass filter of 1-70Hz , sensitivity of 7 uV/mm, display  speed of 44mm/sec with Aldonia Keeven 60Hz  notched filter applied as appropriate. EEG data were recorded continuously and digitally stored.  Video monitoring was available and reviewed as appropriate. Description: The posterior dominant rhythm consists of 8-9 Hz activity of moderate voltage (25-35 uV) seen predominantly in posterior head  regions, symmetric and reactive to eye opening and eye closing. Hyperventilation and photic stimulation were not performed.   IMPRESSION: This study is within normal limits. No seizures or epileptiform discharges were seen throughout the recording. Helmi Hechavarria normal interictal EEG does not exclude the diagnosis of epilepsy. Arlin MALVA Krebs   MR BRAIN WO CONTRAST Result Date: 07/06/2024 CLINICAL DATA:  Provided history: Stroke, follow-up. EXAM: MRI HEAD WITHOUT CONTRAST TECHNIQUE: Multiplanar, multiecho pulse sequences of the brain and surrounding structures were obtained without intravenous contrast. COMPARISON:  Non-contrast head CT and CT angiogram head/neck 07/05/2024. FINDINGS: Brain: Cerebral volume is normal. Acute infarcts within cerebellar vermis and bilateral cerebellar hemispheres. Most notably, large acute infarcts are present within the superior cerebellar artery territories bilaterally. Posterior fossa mass effect without cerebellar tonsillar herniation or evidence of obstructive hydrocephalus. Petechial hemorrhage within the cerebellar vermis and superior right cerebellar hemisphere. No evidence of an intracranial mass. No extra-axial fluid collection. No midline shift. Vascular: Please refer to the CTA head/neck performed yesterday. Skull and upper cervical spine: No focal worrisome marrow lesion. Sinuses/Orbits: No mass or acute finding within the imaged orbits. No significant paranasal sinus disease These results will be called to the ordering clinician or representative by the Radiologist Assistant, and communication documented in the PACS or [employer]. IMPRESSION: Acute  infarcts within the cerebellar vermis and bilateral cerebellar hemispheres. Most notably, large acute infarcts are present within the superior cerebellar artery territories bilaterally. Posterior fossa mass effect without cerebellar tonsillar herniation or evidence of obstructive hydrocephalus. Petechial hemorrhage within the cerebellar vermis and superior right cerebellar hemisphere. Electronically Signed   By: Rockey Childs D.O.   On: 07/06/2024 18:29   ECHOCARDIOGRAM COMPLETE Result Date: 07/06/2024    ECHOCARDIOGRAM REPORT   Patient Name:   ADRYANA MOGENSEN Date of Exam: 07/06/2024 Medical Rec #:  [id number]      Height:       65.0 in Accession #:    [id number]     Weight:       140.0 lb Date of Birth:  April 30, 1996     BSA:          1.700 m Patient Age:    27 years       BP:           120/79 mmHg Patient Gender: F              HR:           77 bpm. Exam Location:  Inpatient Procedure: 2D Echo, Color Doppler and Cardiac Doppler (Both Spectral and Color            Flow Doppler were utilized during procedure). Indications:   Stroke i63.9  History:       Patient has no prior history of Echocardiogram examinations.                Stroke.  Sonographer:   Koleen Popper RDCS Referring      [phone] ANASTASSIA DOUTOVA Phys:  Sonographer Comments: Image acquisition challenging due to uncooperative patient. IMPRESSIONS  1. Left ventricular ejection fraction, by estimation, is 60 to 65%. The left ventricle has normal function. The left ventricle has no regional wall motion abnormalities. Left ventricular diastolic parameters were normal.  2. Right ventricular systolic function is normal. The right ventricular size is normal. There is normal pulmonary artery systolic pressure.  3. The mitral valve is normal in structure. Mild mitral valve regurgitation. No evidence of mitral stenosis.  4. The aortic valve is tricuspid.  Aortic valve regurgitation is not visualized. visually opens well but gradient not assessed.  5. The inferior vena  cava is normal in size with greater than 50% respiratory variability, suggesting right atrial pressure of 3 mmHg. FINDINGS  Left Ventricle: Left ventricular ejection fraction, by estimation, is 60 to 65%. The left ventricle has normal function. The left ventricle has no regional wall motion abnormalities. The left ventricular internal cavity size was normal in size. There is  no left ventricular hypertrophy. Left ventricular diastolic parameters were normal. Right Ventricle: The right ventricular size is normal. No increase in right ventricular wall thickness. Right ventricular systolic function is normal. There is normal pulmonary artery systolic pressure. The tricuspid regurgitant velocity is 2.06 m/s, and  with an assumed right atrial pressure of 3 mmHg, the estimated right ventricular systolic pressure is 20.0 mmHg. Left Atrium: Left atrial size was normal in size. Right Atrium: Right atrial size was normal in size. Pericardium: There is no evidence of pericardial effusion. Mitral Valve: The mitral valve is normal in structure. Mild mitral valve regurgitation. No evidence of mitral valve stenosis. Tricuspid Valve: The tricuspid valve is normal in structure. Tricuspid valve regurgitation is mild . No evidence of tricuspid stenosis. Aortic Valve: The aortic valve is tricuspid. Aortic valve regurgitation is not visualized. Visually opens well but gradient not assessed. Pulmonic Valve: The pulmonic valve was normal in structure. Pulmonic valve regurgitation is trivial. No evidence of pulmonic stenosis. Aorta: The aortic root is normal in size and structure. Venous: The inferior vena cava is normal in size with greater than 50% respiratory variability, suggesting right atrial pressure of 3 mmHg. IAS/Shunts: No atrial level shunt detected by color flow Doppler.  LEFT VENTRICLE PLAX 2D LVIDd:         5.00 cm   Diastology LVIDs:         3.60 cm   LV e' medial:    11.60 cm/s LV PW:         0.70 cm   LV E/e' medial:  6.2  LV IVS:        0.70 cm   LV e' lateral:   13.20 cm/s LVOT diam:     1.90 cm   LV E/e' lateral: 5.5 LV SV:         63 LV SV Index:   37 LVOT Area:     2.84 cm  RIGHT VENTRICLE             IVC RV S prime:     17.30 cm/s  IVC diam: 1.90 cm TAPSE (M-mode): 2.9 cm LEFT ATRIUM             Index        RIGHT ATRIUM           Index LA diam:        3.10 cm 1.82 cm/m   RA Area:     11.60 cm LA Vol (A2C):   44.9 ml 26.41 ml/m  RA Volume:   25.10 ml  14.77 ml/m LA Vol (A4C):   20.6 ml 12.12 ml/m LA Biplane Vol: 30.0 ml 17.65 ml/m  AORTIC VALVE             PULMONIC VALVE LVOT Vmax:   115.00 cm/s PR End Diast Vel: 1.51 msec LVOT Vmean:  73.300 cm/s LVOT VTI:    0.221 m  AORTA Ao Root diam: 2.80 cm Ao Asc diam:  2.70 cm MITRAL VALVE  TRICUSPID VALVE MV Area (PHT): 3.68 cm    TR Peak grad:   17.0 mmHg MV Decel Time: 206 msec    TR Vmax:        206.00 cm/s MR Peak grad: 24.2 mmHg MR Vmax:      246.00 cm/s  SHUNTS MV E velocity: 72.40 cm/s  Systemic VTI:  0.22 m MV Gregory Barrick velocity: 86.50 cm/s  Systemic Diam: 1.90 cm MV E/Aunisty Reali ratio:  0.84 Franck Azobou Bruce Electronically signed by Cassidy Bruce Signature Date/Time: 07/06/2024/11:37:42 AM    Final    CT ANGIO HEAD NECK W WO CM W PERF (CODE STROKE) Result Date: 07/05/2024 EXAM: CTA Head and Neck with Perfusion 07/05/2024 08:55:08 PM TECHNIQUE: CTA of the head and neck was performed with the administration of intravenous contrast. 3D postprocessing with multiplanar reconstructions and MIPs was performed to evaluate the vascular anatomy. Automated exposure control, iterative reconstruction, and/or weight based adjustment of the mA/kV was utilized to reduce the radiation dose to as low as reasonably achievable. COMPARISON: 07/05/2024 head CT CLINICAL HISTORY: Neuro deficit, acute, stroke suspected; ataxia, slurred speech, new small cerebellar stroke on CTH. Triage notes: seizure activity stated by her roommate. Pt described as overly emotional, was rolling around and  throwing her hands around, keeps writing in Cassidy Bruce diary she has. Did not hit her head, no oral injury, no loss of urine. Cassidy Bruce\T\Ox4. VS stable. FINDINGS: CTA NECK: AORTIC ARCH AND ARCH VESSELS: No dissection or arterial injury. No significant stenosis of the brachiocephalic or subclavian arteries. CERVICAL CAROTID ARTERIES: No dissection, arterial injury, or hemodynamically significant stenosis by NASCET criteria. CERVICAL VERTEBRAL ARTERIES: No opacification of the left vertebral artery distal V1 and proximal V2 segments. There is reconstitution at the distal V2 segment but enhancement remains asymmetrically decreased throughout the remainder of the course of the vertebral artery. The right vertebral artery is normal. No proximal occlusion visualized of the inferior cerebellar arteries. LUNGS AND MEDIASTINUM: Unremarkable. SOFT TISSUES: No acute abnormality. BONES: No acute abnormality. CTA HEAD: ANTERIOR CIRCULATION: No significant stenosis of the internal carotid arteries. No significant stenosis of the anterior cerebral arteries. No significant stenosis of the middle cerebral arteries. No aneurysm. POSTERIOR CIRCULATION: No significant stenosis of the posterior cerebral arteries. No significant stenosis of the basilar artery. No significant stenosis of the vertebral arteries. No aneurysm. OTHER: No dural venous sinus thrombosis on this non-dedicated study. CT PERFUSION: Fusion imaging shows Cassidy Bruce 13 mL region of ischemia affecting both cerebellar hemispheres but worse on the left. No core infarct. EXAM QUALITY: Exam quality is adequate with diagnostic perfusion maps. No significant motion artifact. Appropriate arterial inflow and venous outflow curves. CORE INFARCT (CBF<30% volume): 0 mL TOTAL HYPOPERFUSION (Tmax>6s volume): 13 mL PENUMBRA: Mismatch volume: 13 mL Mismatch ratio: Not applicable Location: Cerebellar hemispheres, worse on the left. IMPRESSION: 1. Proximal left vertebral artery dissection. Distal v2 segment  reconstitution but with attenuated enhancement relative to the contralateral side along the remainder of its course. 2. Cassidy Bruce 13 mL region of ischemia affecting both cerebellar hemispheres, worse on the left, with no core infarct. Findings discussed with Dr. Caron Salt at 09:05 pm on 07/05/2024 Electronically signed by: Franky Stanford MD 07/05/2024 09:07 PM EDT RP Workstation: [id number]        Scheduled Meds:  acetaminophen   1,000 mg Oral Q8H   aspirin  EC  81 mg Oral Daily   clopidogrel   75 mg Oral Daily   rosuvastatin   20 mg Oral Daily   thiamine  (VITAMIN B1) injection  100  mg Intravenous Daily   Continuous Infusions:  sodium chloride  75 mL/hr at 07/07/24 1353     LOS: 2 days    Time spent: over 30 min     Meliton Monte, MD [facility]   To contact the attending provider between 7A-7P or the covering provider during after hours 7P-7A, please log into the web site [email or web address] and access using universal [place] password for that web site. If you do not have the password, please call the hospital operator.  07/07/2024, 7:08 PM

## 2024-07-07 NOTE — Anesthesia Preprocedure Evaluation (Addendum)
 Anesthesia Evaluation  Patient identified by MRN, date of birth, ID band Patient awake    Reviewed: Allergy & Precautions, NPO status , Patient's Chart, lab work & pertinent test results  Airway Mallampati: II  TM Distance: >3 FB Neck ROM: Full    Dental no notable dental hx. (+) Dental Advisory Given, Teeth Intact   Pulmonary neg pulmonary ROS   Pulmonary exam normal breath sounds clear to auscultation       Cardiovascular negative cardio ROS Normal cardiovascular exam Rhythm:Regular Rate:Normal  Echo 07/06/2024  1. Left ventricular ejection fraction, by estimation, is 60 to 65%. The  left ventricle has normal function. The left ventricle has no regional  wall motion abnormalities. Left ventricular diastolic parameters were  normal.   2. Right ventricular systolic function is normal. The right ventricular  size is normal. There is normal pulmonary artery systolic pressure.   3. The mitral valve is normal in structure. Mild mitral valve  regurgitation. No evidence of mitral stenosis.   4. The aortic valve is tricuspid. Aortic valve regurgitation is not  visualized. visually opens well but gradient not assessed.   5. The inferior vena cava is normal in size with greater than 50%  respiratory variability, suggesting right atrial pressure of 3 mmHg.      Neuro/Psych  PSYCHIATRIC DISORDERS  Depression    CVA    GI/Hepatic negative GI ROS, Neg liver ROS,,,  Endo/Other  negative endocrine ROS    Renal/GU negative Renal ROS     Musculoskeletal negative musculoskeletal ROS (+)    Abdominal   Peds  Hematology negative hematology ROS (+)   Anesthesia Other Findings   Reproductive/Obstetrics                              Anesthesia Physical Anesthesia Plan  ASA: 2  Anesthesia Plan: MAC   Post-op Pain Management: Minimal or no pain anticipated   Induction: Intravenous  PONV Risk Score and  Plan: 2 and Propofol  infusion, TIVA and Treatment may vary due to age or medical condition  Airway Management Planned:   Additional Equipment:   Intra-op Plan:   Post-operative Plan:   Informed Consent: I have reviewed the patients History and Physical, chart, labs and discussed the procedure including the risks, benefits and alternatives for the proposed anesthesia with the patient or authorized representative who has indicated his/her understanding and acceptance.     Dental advisory given  Plan Discussed with: CRNA  Anesthesia Plan Comments:          Anesthesia Quick Evaluation

## 2024-07-07 NOTE — CV Procedure (Signed)
    TRANSESOPHAGEAL ECHOCARDIOGRAM   NAME:  Cassidy Bruce   MRN: [id number] DOB:  08/07/1996   ADMIT DATE: 07/05/2024  INDICATIONS: CVA  PROCEDURE:   Informed consent was obtained prior to the procedure. The risks, benefits and alternatives for the procedure were discussed and the patient comprehended these risks.  Risks include, but are not limited to, cough, sore throat, vomiting, nausea, somnolence, esophageal and stomach trauma or perforation, bleeding, low blood pressure, aspiration, pneumonia, infection, trauma to the teeth and death.    Procedural time out performed. Patient received monitored anesthesia care under the supervision of Dr. Darlyn. Patient received a total of 380 mg propofol  during the procedure.  The transesophageal probe was inserted in the esophagus and stomach without difficulty and multiple views were obtained.    COMPLICATIONS:    There were no immediate complications.  FINDINGS:  LEFT VENTRICLE: EF = 60-65%. No regional wall motion abnormalities.  RIGHT VENTRICLE: Normal size and function.   LEFT ATRIUM: No thrombus/mass.  LEFT ATRIAL APPENDAGE: No thrombus/mass.   RIGHT ATRIUM: No thrombus/mass. Chiari network  AORTIC VALVE:  Trileaflet. No regurgitation. No vegetation.  MITRAL VALVE:    Generally normal structure, though distal anterior leaflet tip appears to have minimal prolapse of fibrinous appearing structures. Mild regurgitation. No vegetation.  TRICUSPID VALVE: Normal structure. Mild regurgitation. No vegetation.  PULMONIC VALVE: Grossly normal structure. Trivial regurgitation. No apparent vegetation.  INTERATRIAL SEPTUM: PFO seen by color Doppler (left to right shunt). Agitated saline at rest was late positive for shunt, but agitated saline with abdominal pressure (Valsalva) was rapidly positive, consistent with right to left intra-atrial shunt.  PERICARDIUM: No effusion noted.  DESCENDING AORTA: No plaque seen   CONCLUSION: PFO  with both left to right and right to left shunt   Shelda Bruckner, MD, PhD, [facility] [place]  [facility]  [place]  [facility] at [facility] at [facility] [address], [address] [place], [place] [address] [phone]   2:25 PM

## 2024-07-07 NOTE — Progress Notes (Signed)
 EEG complete - results pending

## 2024-07-07 NOTE — Evaluation (Signed)
 Physical Therapy Evaluation Patient Details Name: Cassidy Bruce MRN: [id number] DOB: 03/08/1996 Today's Date: 07/07/2024  History of Present Illness  Pt is a 28 yo female presenting to [facility] on 07/05/24 for dizziness, slurred speech, and impaired balance. MRI showed bilat cerebellar infarct with L>R. PMH of ADHD, MDD.  Clinical Impression  Pt admitted with above diagnosis. 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 Pt currently needing mod A for mobility due to ataxic motion UE and LE. Pt with HA that did not worsen during gait but remained persistent, frontal and neck region per her report. Patient will benefit from intensive inpatient follow-up therapy, >3 hours/day.   Pt currently with functional limitations due to the deficits listed below (see PT Problem List). Pt will benefit from acute skilled PT to increase their independence and safety with mobility to allow discharge.           If plan is discharge home, recommend the following: Two people to help with walking and/or transfers;A lot of help with bathing/dressing/bathroom;Assistance with feeding;Assistance with cooking/housework;Assist for transportation;Help with stairs or ramp for entrance   Can travel by private vehicle        Equipment Recommendations Other (comment) (TBD)  Recommendations for Other Services  Rehab consult    Functional Status Assessment Patient has had a recent decline in their functional status and demonstrates the ability to make significant improvements in function in a reasonable and predictable amount of time.     Precautions / Restrictions Precautions Precautions: Fall Recall of Precautions/Restrictions: Impaired Restrictions Weight Bearing Restrictions Per Provider Order: No      Mobility  Bed Mobility Overal bed mobility: Needs Assistance Bed Mobility: Supine to Sit, Sit to Supine     Supine to sit: Min assist Sit to supine: Min assist   General bed  mobility comments: pt could not coordinate mvmt to sit straight up in bed however, when given vc's to roll over first she was able to come to EOB with min A to stabilize. Had pt do bridges x10 before bed mobility which did seem to help extraneous motion    Transfers Overall transfer level: Needs assistance Equipment used: Rolling walker (2 wheels), None Transfers: Sit to/from Stand Sit to Stand: Mod assist           General transfer comment: stood first without AD with therapist facing pt. Mod A for power up. Stood 2 more times to [employer] with mod A    Ambulation/Gait Ambulation/Gait assistance: Mod assist, +2 safety/equipment Gait Distance (Feet): 25 Feet Assistive device: Rolling walker (2 wheels) Gait Pattern/deviations: Scissoring, Ataxic Gait velocity: decreased Gait velocity interpretation: <1.31 ft/sec, indicative of household ambulator Pre-gait activities: wt shifting in standing General Gait Details: pt ambulated with mod A and RW, aunt pushing IV pole. Pt scissoring LE's and needing mod A to control mvmt of RW and trunk within RW.  Stairs            Wheelchair Mobility     Tilt Bed    Modified Rankin (Stroke Patients Only)       Balance Overall balance assessment: Needs assistance Sitting-balance support: Feet supported, Bilateral upper extremity supported Sitting balance-Leahy Scale: Poor Sitting balance - Comments: stable with bilat UE support, otherwise mutlidirecitonal swaying.   Standing balance support: Bilateral upper extremity supported, During functional activity, Reliant on assistive device for balance Standing balance-Leahy Scale: Poor Standing balance comment: Reliant on UE support and mod A  Pertinent Vitals/Pain Pain Assessment Pain Assessment: Faces Faces Pain Scale: Hurts whole lot Pain Location: headache Pain Descriptors / Indicators: Headache Pain Intervention(s): Limited activity within  patient's tolerance, Monitored during session    Home Living Family/patient expects to be discharged to:: Private residence Living Arrangements: Non-relatives/Friends Available Help at Discharge: Friend(s);Available PRN/intermittently Type of Home: Apartment (apt style hotel) Home Access: Stairs to enter Entrance Stairs-Rails: Can reach both;Right;Left Entrance Stairs-Number of Steps: 2 flights   Home Layout: One level Home Equipment: None Additional Comments: Pt slid OOB before EMS came    Prior Function Prior Level of Function : Independent/Modified Independent;History of Falls (last six months);Working/employed;Driving             Mobility Comments: ind no AD, 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 ADLs Comments: ind     Extremity/Trunk Assessment   Upper Extremity Assessment Upper Extremity Assessment: Defer to OT evaluation    Lower Extremity Assessment Lower Extremity Assessment: RLE deficits/detail;LLE deficits/detail RLE Deficits / Details: ataxic BLE's R>L, strength grossly 4/5 but with decreased ability to sustain a consistent contraction, pt with extraneous non purposeful movements when trying to move with control RLE Sensation: decreased proprioception RLE Coordination: decreased fine motor;decreased gross motor LLE Deficits / Details: strength was 4/5 throughout but with decreased ability to sustain a consistent contraction, ataxic but has more control L LE than R LLE Sensation: decreased proprioception LLE Coordination: decreased fine motor;decreased gross motor    Cervical / Trunk Assessment Cervical / Trunk Assessment: Normal  Communication   Communication Communication: Impaired Factors Affecting Communication: Difficulty expressing self;Reduced clarity of speech    Cognition Arousal: Alert Behavior During Therapy: Restless   PT - Cognitive impairments: Attention, Sequencing, Problem solving, Safety/Judgement                       PT -  Cognition Comments: pt with decreased awareness of full scope of deficits, difficulties sequencing and problem solving Following commands: Intact       Cueing Cueing Techniques: Verbal cues, Gestural cues     General Comments General comments (skin integrity, edema, etc.): aunt and friend present during session. HA did not increase with upright activity but did not subside either. VSS    Exercises     Assessment/Plan    PT Assessment Patient needs continued PT services  PT Problem List Impaired tone;Impaired sensation;Decreased knowledge of use of DME;Decreased coordination;Decreased mobility;Decreased balance;Decreased activity tolerance;Decreased strength;Pain       PT Treatment Interventions DME instruction;Gait training;Stair training;Functional mobility training;Therapeutic activities;Therapeutic exercise;Balance training;Neuromuscular re-education;Cognitive remediation;Patient/family education    PT Goals (Current goals can be found in the Care Plan section)  Acute Rehab PT Goals Patient Stated Goal: be independent PT Goal Formulation: With patient Time For Goal Achievement: 07/21/24 Potential to Achieve Goals: Good    Frequency Min 3X/week     Co-evaluation               AM-PAC PT 6 Clicks Mobility  Outcome Measure Help needed turning from your back to your side while in a flat bed without using bedrails?: A Little Help needed moving from lying on your back to sitting on the side of a flat bed without using bedrails?: A Little Help needed moving to and from a bed to a chair (including a wheelchair)?: A Lot Help needed standing up from a chair using your arms (e.g., wheelchair or bedside chair)?: A Lot Help needed to walk in hospital room?: Total Help needed  climbing 3-5 steps with a railing? : Total 6 Click Score: 12    End of Session Equipment Utilized During Treatment: Gait belt Activity Tolerance: Patient tolerated treatment well Patient left: in  bed;with call bell/phone within reach;with bed alarm set;with family/visitor present Nurse Communication: Mobility status PT Visit Diagnosis: Unsteadiness on feet (R26.81);Ataxic gait (R26.0);Difficulty in walking, not elsewhere classified (R26.2)    Time: 8851-8778 PT Time Calculation (min) (ACUTE ONLY): 33 min   Charges:   PT Evaluation $PT Eval Moderate Complexity: 1 Mod PT Treatments $Neuromuscular Re-education: 8-22 mins PT General Charges $$ ACUTE PT VISIT: 1 Visit         Richerd Lipoma, PT  Acute Rehab Services Secure chat preferred Office [phone]   Richerd CROME Elpidia Karn 07/07/2024, 2:35 PM

## 2024-07-07 NOTE — Evaluation (Signed)
 Occupational Therapy Evaluation Patient Details Name: Cassidy Bruce MRN: [id number] DOB: Dec 29, 1995 Today's Date: 07/07/2024   History of Present Illness   Pt is a 28 yo female presenting to [facility] on 07/05/24 for dizziness, slurred speech, and impaired balance. MRI showed bilat cerebellar infarct with L>R. PMH of ADHD, MDD.     Clinical Impressions Pt admitted for above, PTA pt was living with her friend and fully ind with her ADLs/iADLs and mobility. Pt currently presenting with impaired balance and impaired coordination throughout her BUEs/BLEs. She c/o headache as well, to which she reports is also affecting her sitting balance. Pt needing mod to max A to safely transfer with RW, currently needing total to min A for ADLs. Pt L strength also noted to be weaker in her Ues. OT to continue following pt acutely ot progress pt as able. Patient has the potential to reach Mod I and demos the ability to tolerate 3 hours of therapy. Pt would benefit from an intensive rehab program to help maximize functional independence.      If plan is discharge home, recommend the following:   A lot of help with walking and/or transfers;A lot of help with bathing/dressing/bathroom;Assistance with cooking/housework;Help with stairs or ramp for entrance;Assist for transportation     Functional Status Assessment   Patient has had a recent decline in their functional status and demonstrates the ability to make significant improvements in function in a reasonable and predictable amount of time.     Equipment Recommendations   BSC/3in1;Other (comment);Tub/shower bench (RW)     Recommendations for Other Services   Rehab consult     Precautions/Restrictions   Precautions Precautions: Fall Recall of Precautions/Restrictions: Impaired Restrictions Weight Bearing Restrictions Per Provider Order: No     Mobility Bed Mobility Overal bed mobility: Needs Assistance Bed Mobility: Supine to Sit, Sit to  Supine     Supine to sit: Min assist Sit to supine: Min assist   General bed mobility comments: min A to assist with raising trunk and help return BLEs to supine.    Transfers Overall transfer level: Needs assistance Equipment used: Rolling walker (2 wheels) Transfers: Bed to chair/wheelchair/BSC, Sit to/from Stand Sit to Stand: Mod assist     Step pivot transfers: Mod assist     General transfer comment: cues needed for hand placement. pivot transfer ranged from mod A to max A as pt ataxic and frequent LOBs.      Balance Overall balance assessment: Needs assistance Sitting-balance support: Feet supported, Bilateral upper extremity supported Sitting balance-Leahy Scale: Poor Sitting balance - Comments: stable with bilat UE support, otherwise mutlidirecitonal swaying.   Standing balance support: Bilateral upper extremity supported, During functional activity, Reliant on assistive device for balance Standing balance-Leahy Scale: Poor Standing balance comment: Reliant on ext support                           ADL either performed or assessed with clinical judgement   ADL Overall ADL's : Needs assistance/impaired Eating/Feeding: NPO   Grooming: Bed level;Oral care;Minimal assistance   Upper Body Bathing: Bed level;Minimal assistance   Lower Body Bathing: Moderate assistance;Sitting/lateral leans Lower Body Bathing Details (indicate cue type and reason): more with standing bathing. Upper Body Dressing : Sitting;Moderate assistance   Lower Body Dressing: Sit to/from stand;Sitting/lateral leans;Maximal assistance   Toilet Transfer: Moderate assistance;Stand-pivot;BSC/3in1;Rolling walker (2 wheels)   Toileting- Clothing Manipulation and Hygiene: Maximal assistance;Sit to/from stand Toileting - Architect Details (indicate  cue type and reason): max A for balance while pt performed pericare.     Functional mobility during ADLs: Moderate  assistance;Rolling walker (2 wheels)       Vision   Vision Assessment?: Yes Eye Alignment: Within Functional Limits Ocular Range of Motion: Within Functional Limits Alignment/Gaze Preference: Within Defined Limits Tracking/Visual Pursuits: Able to track stimulus in all quads without difficulty Convergence: Within functional limits Visual Fields: No apparent deficits Diplopia Assessment: Other (comment) (denies) Additional Comments: Nystagmus with R end range gaze.     Perception Perception:  (not fully assessed, no inattention or body scheme deficits present)       Praxis Praxis: WFL       Pertinent Vitals/Pain Pain Assessment Pain Assessment: Faces Faces Pain Scale: Hurts whole lot Pain Location: headache Pain Descriptors / Indicators: Headache Pain Intervention(s): Limited activity within patient's tolerance, Monitored during session, Repositioned     Extremity/Trunk Assessment Upper Extremity Assessment Upper Extremity Assessment: RUE deficits/detail;LUE deficits/detail;Right hand dominant RUE Deficits / Details: Ataxic with gross movement, strength 5/5 throughout. RUE Sensation: decreased light touch RUE Coordination: decreased gross motor LUE Deficits / Details: ataxic with gross movement. Strength in elbow ext 3+/5, elbow flexors 4/5, shoulder flexors 3+/5 LUE Sensation: WNL LUE Coordination: decreased gross motor   Lower Extremity Assessment Lower Extremity Assessment: LLE deficits/detail;RLE deficits/detail RLE Deficits / Details: strength was 4/5 throughout, atatxic RLE Sensation: WNL RLE Coordination: decreased fine motor;decreased gross motor LLE Deficits / Details: strength was 4/5 throughout, atatxic LLE Sensation: WNL LLE Coordination: decreased fine motor;decreased gross motor       Communication Communication Communication: Impaired Factors Affecting Communication: Difficulty expressing self;Reduced clarity of speech   Cognition Arousal:  Alert Behavior During Therapy: Restless Cognition: No apparent impairments, Difficult to assess Difficult to assess due to: Impaired communication (dysarthric)           OT - Cognition Comments: pt very restless, she c/o headache and denies dizziness. Unable to maintain a static sitting posture.                 Following commands: Intact       Cueing  General Comments   Cueing Techniques: Verbal cues;Gestural cues  Pt mother and friend present during session   Exercises     Shoulder Instructions      Home Living Family/patient expects to be discharged to:: Private residence Living Arrangements: Non-relatives/Friends Available Help at Discharge: Friend(s);Available PRN/intermittently Type of Home: Apartment (apt style hotel) Home Access: Stairs to enter [employer] of Steps: 2 flights Entrance Stairs-Rails: Can reach both;Right;Left Home Layout: One level     Bathroom Shower/Tub: Chief Strategy Officer: Standard     Home Equipment: None   Additional Comments: Pt slid OOB before EMS came      Prior Functioning/Environment Prior Level of Function : Independent/Modified Independent;History of Falls (last six months);Working/employed;Driving             Mobility Comments: ind no AD ADLs Comments: ind    OT Problem List: Pain;Impaired balance (sitting and/or standing);Decreased activity tolerance;Decreased coordination;Decreased strength;Decreased knowledge of use of DME or AE   OT Treatment/Interventions: Self-care/ADL training;Therapeutic exercise;Patient/family education;Balance training;Therapeutic activities;DME and/or AE instruction;Neuromuscular education      OT Goals(Current goals can be found in the care plan section)   Acute Rehab OT Goals Patient Stated Goal: to get better, return to norm OT Goal Formulation: With patient Time For Goal Achievement: 07/21/24 Potential to Achieve Goals: Good ADL Goals Pt Will  Perform Grooming: with modified independence;standing Pt Will Perform Lower Body Bathing: with modified independence;sit to/from stand Pt Will Perform Upper Body Dressing: with modified independence;sitting Pt Will Perform Lower Body Dressing: sit to/from stand Pt Will Transfer to Toilet: with modified independence;ambulating Additional ADL Goal #1: Pt will complete coorindation HEP for BUEs/BLEs independently   OT Frequency:  Min 3X/week    Co-evaluation              AM-PAC OT 6 Clicks Daily Activity     Outcome Measure Help from another person eating meals?: Total (NPO) Help from another person taking care of personal grooming?: A Little Help from another person toileting, which includes using toliet, bedpan, or urinal?: A Lot Help from another person bathing (including washing, rinsing, drying)?: A Lot Help from another person to put on and taking off regular upper body clothing?: A Lot Help from another person to put on and taking off regular lower body clothing?: A Lot 6 Click Score: 12   End of Session Equipment Utilized During Treatment: Gait belt;Rolling walker (2 wheels)  Activity Tolerance: Patient tolerated treatment well Patient left: in bed;with call bell/phone within reach;with nursing/sitter in room;with family/visitor present  OT Visit Diagnosis: Unsteadiness on feet (R26.81);Other abnormalities of gait and mobility (R26.89);Other symptoms and signs involving the nervous system (R29.898);Ataxia, unspecified (R27.0);Pain Pain - part of body:  (headache)                Time: 9064-8984 OT Time Calculation (min): 40 min Charges:  OT General Charges $OT Visit: 1 Visit OT Evaluation $OT Eval Moderate Complexity: 1 Mod OT Treatments $Self Care/Home Management : 8-22 mins $Therapeutic Activity: 8-22 mins  07/07/2024  AB, OTR/L  Acute Rehabilitation Services  Office: [phone]   Curtistine JONETTA Das 07/07/2024, 10:42 AM

## 2024-07-07 NOTE — Transfer of Care (Signed)
 Immediate Anesthesia Transfer of Care Note  Patient: Cassidy Bruce  Procedure(s) Performed: TRANSESOPHAGEAL ECHOCARDIOGRAM  Patient Location: Cath Lab  Anesthesia Type:MAC  Level of Consciousness: sedated, drowsy, and responds to stimulation  Airway & Oxygen Therapy: Patient Spontanous Breathing and Patient connected to nasal cannula oxygen  Post-op Assessment: Report given to RN and Post -op Vital signs reviewed and stable  Post vital signs: Reviewed and stable  Last Vitals:  Vitals Value Taken Time  BP 126/86 14:30  Temp    Pulse 96   Resp 18   SpO2 100     Last Pain:  Vitals:   07/07/24 1314  TempSrc: Temporal  PainSc:          Complications: There were no known notable events for this encounter.

## 2024-07-08 ENCOUNTER — Inpatient Hospital Stay (HOSPITAL_COMMUNITY): Payer: MEDICAID

## 2024-07-08 DIAGNOSIS — I639 Cerebral infarction, unspecified: Secondary | ICD-10-CM

## 2024-07-08 LAB — HOMOCYSTEINE: Homocysteine: 7.9 umol/L (ref 0.0–14.5)

## 2024-07-08 LAB — LUPUS ANTICOAGULANT PANEL
DRVVT: 40.7 s (ref 0.0–47.0)
PTT Lupus Anticoagulant: 28.3 s (ref 0.0–43.5)

## 2024-07-08 LAB — VITAMIN B12: Vitamin B-12: 361 pg/mL (ref 180–914)

## 2024-07-08 LAB — IRON AND TIBC
Iron: 61 ug/dL (ref 28–170)
Saturation Ratios: 17 % (ref 10.4–31.8)
TIBC: 350 ug/dL (ref 250–450)
UIBC: 289 ug/dL

## 2024-07-08 LAB — CARDIOLIPIN ANTIBODIES, IGG, IGM, IGA
Anticardiolipin IgA: <9 U/mL (ref 0–11)
Anticardiolipin IgG: <9 GPL U/mL (ref 0–14)
Anticardiolipin IgM: <9 [MPL'U]/mL (ref 0–12)

## 2024-07-08 LAB — FOLATE: Folate: 11.1 ng/mL (ref >5.9)

## 2024-07-08 LAB — BETA-2-GLYCOPROTEIN I ABS, IGG/M/A
Beta-2 Glyco I IgG: <9 GPI IgG units (ref 0–20)
Beta-2-Glycoprotein I IgA: <9 GPI IgA units (ref 0–25)
Beta-2-Glycoprotein I IgM: <9 GPI IgM units (ref 0–32)

## 2024-07-08 LAB — FERRITIN: Ferritin: 17 ng/mL (ref 11–307)

## 2024-07-08 LAB — ANA W/REFLEX IF POSITIVE: Anti Nuclear Antibody (ANA): NEGATIVE

## 2024-07-08 MED ORDER — FERROUS SULFATE 325 (65 FE) MG PO TABS
325.0000 mg | ORAL_TABLET | ORAL | Status: DC
  Administered 2024-07-08 – 2024-07-12 (×3): 325 mg via ORAL
  Filled 2024-07-08 (×3): qty 1

## 2024-07-08 NOTE — Progress Notes (Addendum)
 PROGRESS NOTE    Cassidy Bruce  FMW:[id number] DOB: 04/06/1996 DOA: 07/05/2024 PCP: Pcp, No  Chief Complaint  Patient presents with   Seizures    Brief Narrative:   Cassidy Bruce is Cassidy Bruce 28 year old with Cassidy Bruce past medical history significant for ADHD and depression who presented to the hospital with seizure-like activity and has been having intermittent headaches. Yesterday around 3:30 PM she became unresponsive was noted to be shaking all over and would not follow commands or answer questions. She was brought to the ED for further evaluation and acetaminophen  and salicylate levels were negative and alcohol was negative. She appeared ataxic and CTA of the head neck was done and showed Cassidy Bruce proximal left vertebral artery dissection. Neuro was consulted and recommended stroke workup and transferring the the patient to [facility] for further evaluation. He continues to complain of severe headache and continues to have incoordination, imbalance and slurred speech.   Assessment & Plan:   Principal Problem:   Stroke [facility]) Active Problems:   Hypokalemia   PFO (patent foramen ovale)   Vertebral artery dissection (HCC)  Acute Stroke MRI with acute infarcts within the cerebellar vermis and bilateral cerebellar hemispheres.  Large acute infarcts present within the superior cerebellar artery territories bilaterally.  Posterior fossa mass effect without cerebellar tonsillar herniation or evidence of obstructive hydrocephalus.  Petechial hemorrhage within the cerebellar vermis and suerior R cerebellar hemisphere. CTA head/neck with proximal L vertebral artery dissection, 13 ml region of ischemia affecting both cerebellar hemispheres, worse on L TEE with PFO  Transcranial doppler pending LE US  negative for DVT Echo with EF 60-65%, no RWMA LDL 104, A1c 5.4 ANA pending, cardiolipin ab pending, serum homecysteine, beta 2 glycoprotein ab, lupus anticoagulant pending.  Pending vitamin b1.  Appreciate neurology  assistance - bilateral cerebellar infarct, embolic vs L VA occlusion/dissection - recommending DAPT x3 months, then ASA alone.  Crestor  20 mg daily (stop if pregnant or planning pregnancy).  Dyslipidemia Statin   Acute Kidney Injury Very mild, trend  Elevated LFT's Noted, workup further as indicated  Leukocytosis Noted, mild  Follow   Microcytosis Iron studies, will start every other day iron  Mood Disorder Will discuss medication for depression, will consider later in her course Denies SI  Complex Family Dynamics At this point in time, she's deferring to her friend [name]  Birth Control  Nexplanon  in place (L arm) - appears it was placed 08/2020 Will request for this to be removed, discussed with gyn.   Homelessness TOC     DVT prophylaxis: SCD Code Status: full Family Communication: mother, aunt, friend Disposition:   Status is: Inpatient Remains inpatient appropriate because: need for continued inpatient care   Consultants:  Neurology cards  Procedures:  LE US  Summary:  RIGHT:      - There is no evidence of deep vein thrombosis in the lower extremity.    - No cystic structure found in the popliteal fossa.    LEFT:      - There is no evidence of deep vein thrombosis in the lower extremity.    - No cystic structure found in the popliteal fossa.   TEE IMPRESSIONS     1. Left ventricular ejection fraction, by estimation, is 60 to 65%. The  left ventricle has normal function.   2. Right ventricular systolic function is normal. The right ventricular  size is normal.   3. No left atrial/left atrial appendage thrombus was detected. The LAA  emptying velocity was 64 cm/s.  4. Distal anterior mitral leaflet tip appears to have minimal prolapse of  fibrinous appearing structures. The mitral valve is abnormal. Trivial  mitral valve regurgitation. No evidence of mitral stenosis.   5. The aortic valve is tricuspid. Aortic valve regurgitation is  not  visualized. No aortic stenosis is present.   6. Evidence of atrial level shunting detected by color flow Doppler.  Agitated saline contrast bubble study was positive with shunting observed  within 3-6 cardiac cycles suggestive of interatrial shunt. There is Cassidy Mccumbers  small patent foramen ovale with  bidirectional shunting across atrial septum.   7. 3D performed of the mitral valve and 3D performed of the atrial septum  and demonstrates Distal anterior mitral leaflet tip appears to have  minimal prolapse of fibrinous appearing structures. PFO visualized.   Conclusion(s)/Recommendation(s): PFO seen by color Doppler (left to right  shunt). Agitated saline at rest was late positive for shunt, but agitated  saline with abdominal pressure (Valsalva) was rapidly positive, consistent  with right to left intra-atrial   shunt.   Echo IMPRESSIONS     1. Left ventricular ejection fraction, by estimation, is 60 to 65%. The  left ventricle has normal function. The left ventricle has no regional  wall motion abnormalities. Left ventricular diastolic parameters were  normal.   2. Right ventricular systolic function is normal. The right ventricular  size is normal. There is normal pulmonary artery systolic pressure.   3. The mitral valve is normal in structure. Mild mitral valve  regurgitation. No evidence of mitral stenosis.   4. The aortic valve is tricuspid. Aortic valve regurgitation is not  visualized. visually opens well but gradient not assessed.   5. The inferior vena cava is normal in size with greater than 50%  respiratory variability, suggesting right atrial pressure of 3 mmHg.   Antimicrobials:  Anti-infectives (From admission, onward)    None       Subjective: No new complaints  Objective: Vitals:   07/07/24 2337 07/08/24 0343 07/08/24 0848 07/08/24 1245  BP: 109/77 (!) 114/91 117/78 126/86  Pulse: 72 74 (!) 53 66  Resp: 18 18 12 16   Temp: 98.3 F (36.8 C) 98.3 F (36.8  C) 97.8 F (36.6 C) 98.4 F (36.9 C)  TempSrc: Oral Oral Oral Oral  SpO2: 99% 100% 99% 100%    Intake/Output Summary (Last 24 hours) at 07/08/2024 1513 Last data filed at 07/08/2024 0300 Gross per 24 hour  Intake 614.65 ml  Output 350 ml  Net 264.65 ml   There were no vitals filed for this visit.  Examination:  General: No acute distress. Cardiovascular: RRR Lungs: unlabored Abdomen: Soft, nontender, nondistended  Neurological: dysarthria.  4+/5 R sided strength.  Ataxia with FNF (bilateral) and H2S (R>L). Extremities: No clubbing or cyanosis. No edema.   Data Reviewed: I have personally reviewed following labs and imaging studies  CBC: Recent Labs  Lab 07/05/24 1653 07/06/24 0625 07/07/24 0317  WBC 7.6 12.0* 10.8*  NEUTROABS 5.2  --  7.8*  HGB 13.3 12.8 13.1  HCT 40.1 39.4 40.1  MCV 77.7* 77.9* 78.9*  PLT 138* 125* 156    Basic Metabolic Panel: Recent Labs  Lab 07/05/24 1653 07/06/24 0625 07/07/24 0317  NA 142 139 143  K 3.2* 4.6 4.0  CL 107 106 110  CO2 21* 21* 24  GLUCOSE 123* 90 105*  BUN 9 8 8   CREATININE 0.79 0.70 1.06*  CALCIUM  9.0 9.2 8.5*  MG 2.1  --  1.9  PHOS 2.2*  --  3.0    GFR: CrCl cannot be calculated (Unknown ideal weight.).  Liver Function Tests: Recent Labs  Lab 07/05/24 1653 07/06/24 0625 07/07/24 0317  AST 38 36 33  ALT 58* 60* 48*  ALKPHOS 78 77 62  BILITOT 0.5 0.4 0.5  PROT 7.1 6.9 6.4*  ALBUMIN 4.3 4.1 3.3*    CBG: Recent Labs  Lab 07/05/24 1705  GLUCAP 97     No results found for this or any previous visit (from the past 240 hours).       Radiology Studies: ECHO TEE Result Date: 07/07/2024    TRANSESOPHOGEAL ECHO REPORT   Patient Name:   LARIAH FLEER Date of Exam: 07/07/2024 Medical Rec #:  [id number]      Height:       65.0 in Accession #:    [id number]     Weight:       140.0 lb Date of Birth:  [birth year]     BSA:          1.700 Bruce Patient Age:    27 years       BP:           128/80 mmHg Patient Gender:  F              HR:           111 bpm. Exam Location:  Inpatient Procedure: Transesophageal Echo, 3D Echo, Cardiac Doppler, Color Doppler and            Saline Contrast Bubble Study (Both Spectral and Color Flow Doppler            were utilized during procedure). Indications:     Stroke i63.9  History:         Patient has prior history of Echocardiogram examinations, most                  recent 07/06/2024.  Sonographer:     Damien Senior RDCS Referring Phys:  [id number] HAO MENG Diagnosing Phys: Shelda Bruckner MD PROCEDURE: After discussion of the risks and benefits of Dov Dill TEE, an informed consent was obtained from the patient. The transesophogeal probe was passed without difficulty through the esophogus of the patient. Sedation performed by different physician. The patient was monitored while under deep sedation. Anesthestetic sedation was provided intravenously by Anesthesiology: 380mg  of Propofol , 40mg  of Lidocaine . Image quality was good. The patient developed no complications during the procedure.  IMPRESSIONS  1. Left ventricular ejection fraction, by estimation, is 60 to 65%. The left ventricle has normal function.  2. Right ventricular systolic function is normal. The right ventricular size is normal.  3. No left atrial/left atrial appendage thrombus was detected. The LAA emptying velocity was 64 cm/s.  4. Distal anterior mitral leaflet tip appears to have minimal prolapse of fibrinous appearing structures. The mitral valve is abnormal. Trivial mitral valve regurgitation. No evidence of mitral stenosis.  5. The aortic valve is tricuspid. Aortic valve regurgitation is not visualized. No aortic stenosis is present.  6. Evidence of atrial level shunting detected by color flow Doppler. Agitated saline contrast bubble study was positive with shunting observed within 3-6 cardiac cycles suggestive of interatrial shunt. There is Cassidy Bruce small patent foramen ovale with bidirectional shunting across atrial septum.  7. 3D  performed of the mitral valve and 3D performed of the atrial septum and demonstrates Distal anterior mitral leaflet tip appears to have minimal prolapse of fibrinous appearing structures. PFO visualized. Conclusion(s)/Recommendation(s): PFO  seen by color Doppler (left to right shunt). Agitated saline at rest was late positive for shunt, but agitated saline with abdominal pressure (Valsalva) was rapidly positive, consistent with right to left intra-atrial  shunt. FINDINGS  Left Ventricle: Left ventricular ejection fraction, by estimation, is 60 to 65%. The left ventricle has normal function. The left ventricular internal cavity size was normal in size. Right Ventricle: The right ventricular size is normal. No increase in right ventricular wall thickness. Right ventricular systolic function is normal. Left Atrium: Left atrial size was normal in size. No left atrial/left atrial appendage thrombus was detected. The LAA emptying velocity was 64 cm/s. Right Atrium: Right atrial size was normal in size. Prominent Chiari network. Pericardium: There is no evidence of pericardial effusion. Mitral Valve: Distal anterior mitral leaflet tip appears to have minimal prolapse of fibrinous appearing structures. The mitral valve is abnormal. Trivial mitral valve regurgitation. No evidence of mitral valve stenosis. There is no evidence of mitral valve vegetation. Tricuspid Valve: The tricuspid valve is normal in structure. Tricuspid valve regurgitation is mild . No evidence of tricuspid stenosis. There is no evidence of tricuspid valve vegetation. Aortic Valve: The aortic valve is tricuspid. Aortic valve regurgitation is not visualized. No aortic stenosis is present. There is no evidence of aortic valve vegetation. Pulmonic Valve: The pulmonic valve was normal in structure. Pulmonic valve regurgitation is trivial. There is no evidence of pulmonic valve vegetation. Aorta: The aortic root and ascending aorta are structurally normal,  with no evidence of dilitation. IAS/Shunts: Evidence of atrial level shunting detected by color flow Doppler. Agitated saline contrast was given intravenously to evaluate for intracardiac shunting. Agitated saline contrast bubble study was positive with shunting observed within 3-6 cardiac cycles suggestive of interatrial shunt. There is no evidence of Ariyanah Aguado patent foramen ovale. Cassidy Bruce small patent foramen ovale is detected with bidirectional shunting across atrial septum. Additional Comments: Spectral Doppler performed. Shelda Bruckner MD Electronically signed by Shelda Bruckner MD Signature Date/Time: 07/07/2024/6:59:37 PM    Final    VAS US  LOWER EXTREMITY VENOUS (DVT) Result Date: 07/07/2024  Lower Venous DVT Study Patient Name:  ROSETTA RUPNOW  Date of Exam:   07/07/2024 Medical Rec #: [id number]       Accession #:    [id number] Date of Birth: 1996-03-07      Patient Gender: F Patient Age:   69 years Exam Location:  [facility] Procedure:      VAS US  LOWER EXTREMITY VENOUS (DVT) Referring Phys: ARY XU --------------------------------------------------------------------------------  Indications: Stroke.  Risk Factors: None identified. Comparison Study: No prior studies. Performing Technologist: Cordella COLLET RVT  Examination Guidelines: Adessa Primiano complete evaluation includes B-mode imaging, spectral Doppler, color Doppler, and power Doppler as needed of all accessible portions of each vessel. Bilateral testing is considered an integral part of Miyani Cronic complete examination. Limited examinations for reoccurring indications may be performed as noted. The reflux portion of the exam is performed with the patient in reverse Trendelenburg.  +---------+---------------+---------+-----------+----------+--------------+ RIGHT    CompressibilityPhasicitySpontaneityPropertiesThrombus Aging +---------+---------------+---------+-----------+----------+--------------+ CFV      Full           Yes      Yes                                  +---------+---------------+---------+-----------+----------+--------------+ SFJ      Full                                                        +---------+---------------+---------+-----------+----------+--------------+  FV Prox  Full                                                        +---------+---------------+---------+-----------+----------+--------------+ FV Mid   Full                                                        +---------+---------------+---------+-----------+----------+--------------+ FV DistalFull                                                        +---------+---------------+---------+-----------+----------+--------------+ PFV      Full                                                        +---------+---------------+---------+-----------+----------+--------------+ POP      Full           Yes      Yes                                 +---------+---------------+---------+-----------+----------+--------------+ PTV      Full                                                        +---------+---------------+---------+-----------+----------+--------------+ PERO     Full                                                        +---------+---------------+---------+-----------+----------+--------------+   +---------+---------------+---------+-----------+----------+--------------+ LEFT     CompressibilityPhasicitySpontaneityPropertiesThrombus Aging +---------+---------------+---------+-----------+----------+--------------+ CFV      Full           Yes      Yes                                 +---------+---------------+---------+-----------+----------+--------------+ SFJ      Full                                                        +---------+---------------+---------+-----------+----------+--------------+ FV Prox  Full                                                         +---------+---------------+---------+-----------+----------+--------------+  FV Mid   Full                                                        +---------+---------------+---------+-----------+----------+--------------+ FV DistalFull                                                        +---------+---------------+---------+-----------+----------+--------------+ PFV      Full                                                        +---------+---------------+---------+-----------+----------+--------------+ POP      Full           Yes      Yes                                 +---------+---------------+---------+-----------+----------+--------------+ PTV      Full                                                        +---------+---------------+---------+-----------+----------+--------------+ PERO     Full                                                        +---------+---------------+---------+-----------+----------+--------------+     Summary: RIGHT: - There is no evidence of deep vein thrombosis in the lower extremity.  - No cystic structure found in the popliteal fossa.  LEFT: - There is no evidence of deep vein thrombosis in the lower extremity.  - No cystic structure found in the popliteal fossa.  *See table(s) above for measurements and observations. Electronically signed by Lonni Gaskins MD on 07/07/2024 at 5:40:37 PM.    Final    EP STUDY Result Date: 07/07/2024 See surgical note for result.  EEG adult Result Date: 07/07/2024 Shelton Arlin KIDD, MD     07/07/2024  8:49 AM Patient Name: Niyanna Asch MRN: [id number] Epilepsy Attending: Arlin KIDD Shelton Referring Physician/Provider: Doutova, Anastassia, MD Date: 07/07/2024 Duration: 23.24 mins Patient history: 28yo F with seizure like activity. EEG to evaluate for seizure Level of alertness: Awake AEDs during EEG study: None Technical aspects: This EEG study was done with scalp electrodes positioned according to the  10-20 International system of electrode placement. Electrical activity was reviewed with band pass filter of 1-70Hz , sensitivity of 7 uV/mm, display speed of 80mm/sec with [place] 60Hz  notched filter applied as appropriate. EEG data were recorded continuously and digitally stored.  Video monitoring was available and reviewed as appropriate. Description: The posterior dominant rhythm consists of 8-9 Hz activity of moderate voltage (25-35 uV) seen predominantly in posterior head regions,  symmetric and reactive to eye opening and eye closing. Hyperventilation and photic stimulation were not performed.   IMPRESSION: This study is within normal limits. No seizures or epileptiform discharges were seen throughout the recording. Nevia Henkin normal interictal EEG does not exclude the diagnosis of epilepsy. Arlin MALVA Krebs   MR BRAIN WO CONTRAST Result Date: 07/06/2024 CLINICAL DATA:  Provided history: Stroke, follow-up. EXAM: MRI HEAD WITHOUT CONTRAST TECHNIQUE: Multiplanar, multiecho pulse sequences of the brain and surrounding structures were obtained without intravenous contrast. COMPARISON:  Non-contrast head CT and CT angiogram head/neck 07/05/2024. FINDINGS: Brain: Cerebral volume is normal. Acute infarcts within cerebellar vermis and bilateral cerebellar hemispheres. Most notably, large acute infarcts are present within the superior cerebellar artery territories bilaterally. Posterior fossa mass effect without cerebellar tonsillar herniation or evidence of obstructive hydrocephalus. Petechial hemorrhage within the cerebellar vermis and superior right cerebellar hemisphere. No evidence of an intracranial mass. No extra-axial fluid collection. No midline shift. Vascular: Please refer to the CTA head/neck performed yesterday. Skull and upper cervical spine: No focal worrisome marrow lesion. Sinuses/Orbits: No mass or acute finding within the imaged orbits. No significant paranasal sinus disease These results will be called to the  ordering clinician or representative by the Radiologist Assistant, and communication documented in the PACS or [employer]. IMPRESSION: Acute infarcts within the cerebellar vermis and bilateral cerebellar hemispheres. Most notably, large acute infarcts are present within the superior cerebellar artery territories bilaterally. Posterior fossa mass effect without cerebellar tonsillar herniation or evidence of obstructive hydrocephalus. Petechial hemorrhage within the cerebellar vermis and superior right cerebellar hemisphere. Electronically Signed   By: Rockey Childs D.O.   On: 07/06/2024 18:29        Scheduled Meds:  acetaminophen   1,000 mg Oral Q8H   aspirin  EC  81 mg Oral Daily   clopidogrel   75 mg Oral Daily   rosuvastatin   20 mg Oral Daily   thiamine  (VITAMIN B1) injection  100 mg Intravenous Daily   Continuous Infusions:  sodium chloride  75 mL/hr at 07/07/24 1353     LOS: 3 days    Time spent: over 30 min     Meliton Monte, MD [facility]   To contact the attending provider between 7A-7P or the covering provider during after hours 7P-7A, please log into the web site [email or web address] and access using universal [place] password for that web site. If you do not have the password, please call the hospital operator.  07/08/2024, 3:13 PM

## 2024-07-08 NOTE — Progress Notes (Signed)
 Prayer and support.  Chaplain available as needed.  Rayleen Dade, [place], [facility], Pager [phone]

## 2024-07-08 NOTE — Progress Notes (Signed)
 VASCULAR LAB    TCD bubble study has been performed.  See CV proc for preliminary results.   Nishka Heide, RVT 07/08/2024, 2:46 PM

## 2024-07-08 NOTE — Evaluation (Signed)
 Speech Language Pathology Evaluation Patient Details Name: Cassidy Bruce MRN: [id number] DOB: 04/07/1996 Today's Date: 07/08/2024 Time: 8897-8881 SLP Time Calculation (min) (ACUTE ONLY): 16 min  Problem List:  Patient Active Problem List   Diagnosis Date Noted   PFO (patent foramen ovale) 07/07/2024   Vertebral artery dissection (HCC) 07/07/2024   Hypokalemia 07/06/2024   Stroke (HCC) 07/05/2024   MDD (major depressive disorder), recurrent episode, severe (HCC) 07/25/2018   Adjustment disorder with mixed anxiety and depressed mood    Past Medical History:  Past Medical History:  Diagnosis Date   ADHD    Depression    Past Surgical History:  Past Surgical History:  Procedure Laterality Date   TRANSESOPHAGEAL ECHOCARDIOGRAM (CATH LAB) N/A 07/07/2024   Procedure: TRANSESOPHAGEAL ECHOCARDIOGRAM;  Surgeon: Lonni Slain, MD;  Location: [facility] INVASIVE CV LAB;  Service: Cardiovascular;  Laterality: N/A;   HPI:  Pt is a 28 yo female presenting to [facility] on 07/05/24 for dizziness, slurred speech, and impaired balance. MRI showed bilat cerebellar infarct with L>R. CTA head/neck showed proximal L vertebral artery dissection. PMH of ADHD, MDD.   Assessment / Plan / Recommendation Clinical Impression  Pt has dysarthria characterized by monotonous phonation, imprecise articulation, impaired pacing with extra pausing between words at times, and impaired resonance. Cognitively she also demonstrates reduced working memory and problem solving, both of which appear to be impacted by her sustained attention. Pt scored 18/30 on the SLUMS (27 and above considered to be WNL), likely suggestive of an acute decline as she was independent and working PTA. Pt also has limited insight into both physical and cognitive deficits, only acknowledging changes to her speech. She would benefit from intensive SLP follow up.      SLP Assessment  SLP Recommendation/Assessment: Patient needs continued Speech Language  Pathology Services SLP Visit Diagnosis: Dysarthria and anarthria (R47.1);Cognitive communication deficit (R41.841)     Assistance Recommended at Discharge  Frequent or constant Supervision/Assistance  Functional Status Assessment Patient has had a recent decline in their functional status and demonstrates the ability to make significant improvements in function in a reasonable and predictable amount of time.  Frequency and Duration min 2x/week  2 weeks      SLP Evaluation Cognition  Overall Cognitive Status: Impaired/Different from baseline Arousal/Alertness: Awake/alert Orientation Level: Oriented X4 Attention: Sustained Sustained Attention: Impaired Sustained Attention Impairment: Verbal basic Memory: Impaired Memory Impairment: Other (comment) (working memory) Awareness: Impaired Awareness Impairment: Intellectual impairment;Other (comment) (online) Problem Solving: Impaired Problem Solving Impairment: Verbal complex Executive Function: Self Monitoring Self Monitoring: Impaired Self Monitoring Impairment: Verbal basic Safety/Judgment: Impaired       Comprehension  Auditory Comprehension Overall Auditory Comprehension: Impaired Commands: Within Functional Limits Conversation: Simple (impaired at paragraph level) Interfering Components: Attention    Expression Expression Primary Mode of Expression: Verbal Verbal Expression Overall Verbal Expression: Other (comment) (fluent expressive language in conversation but only 10 words in one minute on divergent naming task)   Oral / Motor  Motor Speech Overall Motor Speech: Impaired Respiration: Within functional limits Phonation: Other (comment) (monotonous) Resonance: Hypernasality Articulation: Impaired Level of Impairment: Word Intelligibility: Intelligibility reduced Word: 75-100% accurate Phrase: 50-74% accurate            Leita SAILOR., M.A. CCC-SLP Acute Rehabilitation Services Office: [phone]  Secure chat  preferred  07/08/2024, 12:36 PM

## 2024-07-08 NOTE — Progress Notes (Signed)
 Physical Therapy Treatment Patient Details Name: Cassidy Bruce MRN: [id number] DOB: [birth year] Today's Date: 07/08/2024   History of Present Illness Pt is a 28 yo female presenting to [facility] on 07/05/24 for dizziness, slurred speech, and impaired balance. MRI showed bilat cerebellar infarct with L>R. CTA head/neck showed proximal L vertebral artery dissection. PMH of ADHD, MDD.   PT Comments  Pt in bed upon arrival and eager for PT session. Pt was able to improve in today's session by increasing gait distance. Performed multiple short sets of gait training with seated rest breaks in between. Continues to require ModA +2 for chair follow due to ataxic gait pattern. With fatigue, pt had an increase in truncal sway and scissoring step pattern. Pt was able to tolerate >30 minutes of working on gait training, dynamic balance, and LE strengthening. Continue to recommend >3hrs post acute rehab to maximize rehab potential. Pt continues to be an excellent candidate with strong family support, active PLOF, and high levels of motivation. Acute PT to follow.     If plan is discharge home, recommend the following: Two people to help with walking and/or transfers;A lot of help with bathing/dressing/bathroom;Assistance with feeding;Assistance with cooking/housework;Assist for transportation;Help with stairs or ramp for entrance   Can travel by private vehicle      No  Equipment Recommendations  Other (comment) (TBD)    Recommendations for Other Services Rehab consult     Precautions / Restrictions Precautions Precautions: Fall Recall of Precautions/Restrictions: Impaired Restrictions Weight Bearing Restrictions Per Provider Order: No     Mobility  Bed Mobility Overal bed mobility: Needs Assistance Bed Mobility: Rolling, Sidelying to Sit Rolling: Contact guard assist, Used rails Sidelying to sit: Min assist, Used rails    General bed mobility comments: MinA for balance when raising trunk     Transfers Overall transfer level: Needs assistance Equipment used: Rolling walker (2 wheels), None Transfers: Sit to/from Stand Sit to Stand: Mod assist    General transfer comment: cues to shift hips forward prior to standing, ModA for boost-up and trunk stability. Occasional assist to stabilize RW    Ambulation/Gait Ambulation/Gait assistance: Mod assist, +2 safety/equipment Gait Distance (Feet): 40 Feet (x40, x30, x15, x15) Assistive device: Rolling walker (2 wheels) Gait Pattern/deviations: Scissoring, Ataxic, Step-to pattern Gait velocity: decreased    General Gait Details: primarily step-to gait pattern with LE's scissoring and increased truncal sway. Cues to stand upright and to keep RW slightly in front of self as pt tends to have RW too close. Occasional assist to stabilize RW when pt has lateral losses of balance. +2 for close chair follow     Modified Rankin (Stroke Patients Only) Modified Rankin (Stroke Patients Only) Pre-Morbid Rankin Score: No symptoms Modified Rankin: Moderately severe disability     Balance Overall balance assessment: Needs assistance Sitting-balance support: Feet supported, Bilateral upper extremity supported Sitting balance-Leahy Scale: Poor Sitting balance - Comments: stable with bilat UE support, otherwise mutlidirecitonal swaying.   Standing balance support: Bilateral upper extremity supported, During functional activity, Reliant on assistive device for balance Standing balance-Leahy Scale: Poor Standing balance comment: Reliant on UE support and mod A     Communication Communication Communication: Impaired Factors Affecting Communication: Difficulty expressing self;Reduced clarity of speech  Cognition Arousal: Alert Behavior During Therapy: WFL for tasks assessed/performed   PT - Cognitive impairments: Attention, Sequencing, Problem solving, Safety/Judgement    PT - Cognition Comments: Decreased awareness of deficits. Impaired  safety awareness with difficulty sequencing/problem solving Following commands: Impaired Following commands  impaired: Follows multi-step commands inconsistently, Only follows one step commands consistently    Cueing Cueing Techniques: Verbal cues, Gestural cues, Tactile cues  Exercises General Exercises - Lower Extremity Long Arc Quad: AROM, Both, 5 reps, Seated Hip Flexion/Marching: AROM, Both, 5 reps, Seated Other Exercises Other Exercises: Bx10 forward/backward steps with ModA and RW support Other Exercises: Bx10 lateral steps with ModA adn RW support, cognitive task of calling out which leg to step with Other Exercises: x5 serial STS with B UE support    General Comments General comments (skin integrity, edema, etc.): Family and friend present during session and supportive.      Pertinent Vitals/Pain Pain Assessment Pain Assessment: No/denies pain    Home Living Family/patient expects to be discharged to:: Private residence Living Arrangements: Non-relatives/Friends Available Help at Discharge: Friend(s);Available PRN/intermittently Type of Home: Apartment (apt style hotel) Home Access: Stairs to enter Entrance Stairs-Rails: Can reach both;Right;Left Entrance Stairs-Number of Steps: 2 flights   Home Layout: One level Home Equipment: None Additional Comments: Pt slid OOB before EMS came        PT Goals (current goals can now be found in the care plan section) Acute Rehab PT Goals Patient Stated Goal: be independent PT Goal Formulation: With patient Time For Goal Achievement: 07/21/24 Potential to Achieve Goals: Good Progress towards PT goals: Progressing toward goals    Frequency    Min 3X/week       AM-PAC PT 6 Clicks Mobility   Outcome Measure  Help needed turning from your back to your side while in a flat bed without using bedrails?: A Little Help needed moving from lying on your back to sitting on the side of a flat bed without using bedrails?: A  Little Help needed moving to and from a bed to a chair (including a wheelchair)?: A Lot Help needed standing up from a chair using your arms (e.g., wheelchair or bedside chair)?: A Lot Help needed to walk in hospital room?: Total Help needed climbing 3-5 steps with a railing? : Total 6 Click Score: 12    End of Session Equipment Utilized During Treatment: Gait belt Activity Tolerance: Patient tolerated treatment well Patient left: with call bell/phone within reach;with family/visitor present;in chair Nurse Communication: Mobility status PT Visit Diagnosis: Unsteadiness on feet (R26.81);Ataxic gait (R26.0);Difficulty in walking, not elsewhere classified (R26.2)     Time: 1130-1204 PT Time Calculation (min) (ACUTE ONLY): 34 min  Charges:    $Gait Training: 8-22 mins $Neuromuscular Re-education: 8-22 mins PT General Charges $$ ACUTE PT VISIT: 1 Visit                    Kate ORN, PT, DPT Secure Chat Preferred  Rehab Office [phone]  [name] [name] 07/08/2024, 12:18 PM

## 2024-07-08 NOTE — Anesthesia Postprocedure Evaluation (Addendum)
 Anesthesia Post Note  Patient: Cassidy Bruce  Procedure(s) Performed: TRANSESOPHAGEAL ECHOCARDIOGRAM     Patient location during evaluation: Cath Lab Anesthesia Type: MAC Level of consciousness: awake and alert Pain management: pain level controlled Vital Signs Assessment: post-procedure vital signs reviewed and stable Respiratory status: spontaneous breathing Cardiovascular status: stable Anesthetic complications: no   There were no known notable events for this encounter.  Last Vitals:  Vitals:   07/07/24 2337 07/08/24 0343  BP: 109/77 (!) 114/91  Pulse: 72 74  Resp: 18 18  Temp: 36.8 C 36.8 C  SpO2: 99% 100%    Last Pain:  Vitals:   07/08/24 0653  TempSrc:   PainSc: 1                  Norleen Pope

## 2024-07-08 NOTE — Progress Notes (Signed)
 Inpatient Rehab Coordinator Note:  I met with patient, 2 of her aunts, and her friend at bedside to discuss CIR recommendations and goals/expectations of CIR stay.  We reviewed 3 hrs/day of therapy, physician follow up, and average length of stay 2 weeks (dependent upon progress) with goals of supervision 24/7.  We reviewed medicaid pending status.  Family has questions and concerns regarding discharge after CIR, and are hopeful for an inpatient psych admission.  I shared that I believe typically patients are expected to be fully independent for admission to inpt psych but that CSW could confirm that.  Vascular waiting to perform bubble study, and no beds available over the weekend, so we agreed to continue our conversation on Monday.  Per family, and documented by Dr. Perri, pt's preferred decision maker at this time is Zenobia.  Zenobia and family agree that she will pass information to necessary parties.  I've updated TOC and will f/u on Monday at bedside.   Reche Lowers, PT, DPT Admissions Coordinator [phone] 07/08/24  2:52 PM

## 2024-07-08 NOTE — TOC Initial Note (Signed)
 Transition of Care [facility]) - Initial/Assessment Note    Patient Details  Name: Cassidy Bruce MRN: [id number] Date of Birth: 09-09-1996  Transition of [facility]) CM/SW Contact:    Almarie CHRISTELLA Goodie, LCSW Phone Number: 07/08/2024, 3:27 PM  Clinical Narrative:     Patient from home with a roommate, previously independent. Recommendations for IP Rehab, family in agreement. CSW discussed outpatient psychiatric resources, provided information to patient's roommate and aunt, including places the patient can go with sliding scale fee. Family also interested in completing disability paperwork, asking for financial counseling to return (they had previously declined). CSW sent email to financial counseling to please follow back up on disability. CSW coordinated information with CIR Admissions. CSW to follow.              Expected Discharge Plan: IP Rehab Facility Barriers to Discharge: Continued Medical Work up, Inadequate or no insurance   Patient Goals and CMS Choice Patient states their goals for this hospitalization and ongoing recovery are:: patient unable to participate in goal setting, not fully oriented CMS Medicare.gov Compare Post Acute Care list provided to:: Patient Represenative (must comment) Choice offered to / list presented to :  (Friend, Aunts)      Expected Discharge Plan and Services     Post Acute Care Choice: IP Rehab Living arrangements for the past 2 months: Apartment                                      Prior Living Arrangements/Services Living arrangements for the past 2 months: Apartment Lives with:: Roommate Patient language and need for interpreter reviewed:: No Do you feel safe going back to the place where you live?: Yes      Need for Family Participation in Patient Care: Yes (Comment) Care giver support system in place?: Yes (comment)   Criminal Activity/Legal Involvement Pertinent to Current Situation/Hospitalization: No - Comment as  needed  Activities of Daily Living      Permission Sought/Granted                  Emotional Assessment Appearance:: Appears stated age Attitude/Demeanor/Rapport: Unable to Assess Affect (typically observed): Unable to Assess Orientation: : Oriented to Self, Oriented to Place, Oriented to  Time, Oriented to Situation Alcohol / Substance Use: Not Applicable Psych Involvement: No (comment)  Admission diagnosis:  Stroke [facility]) [I63.9] Patient Active Problem List   Diagnosis Date Noted   PFO (patent foramen ovale) 07/07/2024   Vertebral artery dissection (HCC) 07/07/2024   Hypokalemia 07/06/2024   Stroke (HCC) 07/05/2024   MDD (major depressive disorder), recurrent episode, severe (HCC) 07/25/2018   Adjustment disorder with mixed anxiety and depressed mood    PCP:  Pcp, No Pharmacy:   [facility] [phone] - [place], [place] - [address] AT [facility] [address] [place] [address] Phone: [phone] Fax: [phone]     Social Drivers of Health (SDOH) Social History: SDOH Screenings   Food Insecurity: No Food Insecurity (07/06/2024)  Housing: Low Risk  (07/06/2024)  Transportation Needs: No Transportation Needs (07/06/2024)  Utilities: Not At Risk (07/06/2024)  Alcohol Screen: Low Risk  (07/25/2018)  Financial Resource Strain: Low Risk  (05/09/2024)   Received from [facility]  Social Connections: Unknown (07/09/2023)   Received from [facility]  Tobacco Use: Low Risk  (07/07/2024)   SDOH Interventions:  Readmission Risk Interventions     No data to display

## 2024-07-08 NOTE — Progress Notes (Addendum)
 STROKE TEAM PROGRESS NOTE   SUBJECTIVE (INTERVAL HISTORY) Sister is at bedside.  Patient had TTE yesterday showed PFO.  Working with PT OT seems improved but still recommend CIR.  TTE bubble study showed small PFO today.   OBJECTIVE Temp:  [97.8 F (36.6 C)-98.9 F (37.2 C)] 98.4 F (36.9 C) (09/05 1245) Pulse Rate:  [53-86] 66 (09/05 1245) Cardiac Rhythm: Normal sinus rhythm (09/05 0858) Resp:  [12-21] 16 (09/05 1245) BP: (92-127)/(47-92) 126/86 (09/05 1245) SpO2:  [95 %-100 %] 100 % (09/05 1245)  Recent Labs  Lab 07/05/24 1705  GLUCAP 97   Recent Labs  Lab 07/05/24 1653 07/06/24 0625 07/07/24 0317  NA 142 139 143  K 3.2* 4.6 4.0  CL 107 106 110  CO2 21* 21* 24  GLUCOSE 123* 90 105*  BUN 9 8 8   CREATININE 0.79 0.70 1.06*  CALCIUM  9.0 9.2 8.5*  MG 2.1  --  1.9  PHOS 2.2*  --  3.0   Recent Labs  Lab 07/05/24 1653 07/06/24 0625 07/07/24 0317  AST 38 36 33  ALT 58* 60* 48*  ALKPHOS 78 77 62  BILITOT 0.5 0.4 0.5  PROT 7.1 6.9 6.4*  ALBUMIN 4.3 4.1 3.3*   Recent Labs  Lab 07/05/24 1653 07/06/24 0625 07/07/24 0317  WBC 7.6 12.0* 10.8*  NEUTROABS 5.2  --  7.8*  HGB 13.3 12.8 13.1  HCT 40.1 39.4 40.1  MCV 77.7* 77.9* 78.9*  PLT 138* 125* 156   Recent Labs  Lab 07/05/24 1653  CKTOTAL 284*   No results for input(s): LABPROT, INR in the last 72 hours. No results for input(s): COLORURINE, LABSPEC, PHURINE, GLUCOSEU, HGBUR, BILIRUBINUR, KETONESUR, PROTEINUR, UROBILINOGEN, NITRITE, LEUKOCYTESUR in the last 72 hours.  Invalid input(s): APPERANCEUR     Component Value Date/Time   CHOL 179 07/06/2024 0625   TRIG 42 07/06/2024 0625   HDL 67 07/06/2024 0625   CHOLHDL 2.7 07/06/2024 0625   VLDL 8 07/06/2024 0625   LDLCALC 104 (H) 07/06/2024 0625   Lab Results  Component Value Date   HGBA1C 5.4 07/06/2024      Component Value Date/Time   LABOPIA NEGATIVE 07/05/2024 2225   COCAINSCRNUR NEGATIVE 07/05/2024 2225   LABBENZ  NEGATIVE 07/05/2024 2225   AMPHETMU NEGATIVE 07/05/2024 2225   THCU NEGATIVE 07/05/2024 2225   LABBARB NEGATIVE 07/05/2024 2225    Recent Labs  Lab 07/05/24 1653  ETH <15    I have personally reviewed the radiological images below and agree with the radiology interpretations.  ECHO TEE Result Date: 07/07/2024    TRANSESOPHOGEAL ECHO REPORT   Patient Name:   TKEYAH BURKMAN Date of Exam: 07/07/2024 Medical Rec #:  [id number]      Height:       65.0 in Accession #:    [id number]     Weight:       140.0 lb Date of Birth:  October 24, 1996     BSA:          1.700 m Patient Age:    28 years       BP:           128/80 mmHg Patient Gender: F              HR:           111 bpm. Exam Location:  Inpatient Procedure: Transesophageal Echo, 3D Echo, Cardiac Doppler, Color Doppler and            Saline Contrast Bubble Study (Both  Spectral and Color Flow Doppler            were utilized during procedure). Indications:     Stroke i63.9  History:         Patient has prior history of Echocardiogram examinations, most                  recent 07/06/2024.  Sonographer:     Damien Senior RDCS Referring Phys:  [id number] HAO MENG Diagnosing Phys: Shelda Bruckner MD PROCEDURE: After discussion of the risks and benefits of a TEE, an informed consent was obtained from the patient. The transesophogeal probe was passed without difficulty through the esophogus of the patient. Sedation performed by different physician. The patient was monitored while under deep sedation. Anesthestetic sedation was provided intravenously by Anesthesiology: 380mg  of Propofol , 40mg  of Lidocaine . Image quality was good. The patient developed no complications during the procedure.  IMPRESSIONS  1. Left ventricular ejection fraction, by estimation, is 60 to 65%. The left ventricle has normal function.  2. Right ventricular systolic function is normal. The right ventricular size is normal.  3. No left atrial/left atrial appendage thrombus was detected. The LAA  emptying velocity was 64 cm/s.  4. Distal anterior mitral leaflet tip appears to have minimal prolapse of fibrinous appearing structures. The mitral valve is abnormal. Trivial mitral valve regurgitation. No evidence of mitral stenosis.  5. The aortic valve is tricuspid. Aortic valve regurgitation is not visualized. No aortic stenosis is present.  6. Evidence of atrial level shunting detected by color flow Doppler. Agitated saline contrast bubble study was positive with shunting observed within 3-6 cardiac cycles suggestive of interatrial shunt. There is a small patent foramen ovale with bidirectional shunting across atrial septum.  7. 3D performed of the mitral valve and 3D performed of the atrial septum and demonstrates Distal anterior mitral leaflet tip appears to have minimal prolapse of fibrinous appearing structures. PFO visualized. Conclusion(s)/Recommendation(s): PFO seen by color Doppler (left to right shunt). Agitated saline at rest was late positive for shunt, but agitated saline with abdominal pressure (Valsalva) was rapidly positive, consistent with right to left intra-atrial  shunt. FINDINGS  Left Ventricle: Left ventricular ejection fraction, by estimation, is 60 to 65%. The left ventricle has normal function. The left ventricular internal cavity size was normal in size. Right Ventricle: The right ventricular size is normal. No increase in right ventricular wall thickness. Right ventricular systolic function is normal. Left Atrium: Left atrial size was normal in size. No left atrial/left atrial appendage thrombus was detected. The LAA emptying velocity was 64 cm/s. Right Atrium: Right atrial size was normal in size. Prominent Chiari network. Pericardium: There is no evidence of pericardial effusion. Mitral Valve: Distal anterior mitral leaflet tip appears to have minimal prolapse of fibrinous appearing structures. The mitral valve is abnormal. Trivial mitral valve regurgitation. No evidence of mitral  valve stenosis. There is no evidence of mitral valve vegetation. Tricuspid Valve: The tricuspid valve is normal in structure. Tricuspid valve regurgitation is mild . No evidence of tricuspid stenosis. There is no evidence of tricuspid valve vegetation. Aortic Valve: The aortic valve is tricuspid. Aortic valve regurgitation is not visualized. No aortic stenosis is present. There is no evidence of aortic valve vegetation. Pulmonic Valve: The pulmonic valve was normal in structure. Pulmonic valve regurgitation is trivial. There is no evidence of pulmonic valve vegetation. Aorta: The aortic root and ascending aorta are structurally normal, with no evidence of dilitation. IAS/Shunts: Evidence of atrial level shunting detected  by color flow Doppler. Agitated saline contrast was given intravenously to evaluate for intracardiac shunting. Agitated saline contrast bubble study was positive with shunting observed within 3-6 cardiac cycles suggestive of interatrial shunt. There is no evidence of a patent foramen ovale. A small patent foramen ovale is detected with bidirectional shunting across atrial septum. Additional Comments: Spectral Doppler performed. Shelda Bruckner MD Electronically signed by Shelda Bruckner MD Signature Date/Time: 07/07/2024/6:59:37 PM    Final    VAS US  LOWER EXTREMITY VENOUS (DVT) Result Date: 07/07/2024  Lower Venous DVT Study Patient Name:  KEONIA PASKO  Date of Exam:   07/07/2024 Medical Rec #: [id number]       Accession #:    [id number] Date of Birth: 06-07-1996      Patient Gender: F Patient Age:   59 years Exam Location:  [facility] Procedure:      VAS US  LOWER EXTREMITY VENOUS (DVT) Referring Phys: ARY Adelyne Marchese --------------------------------------------------------------------------------  Indications: Stroke.  Risk Factors: None identified. Comparison Study: No prior studies. Performing Technologist: Cordella COLLET RVT  Examination Guidelines: A complete evaluation includes  B-mode imaging, spectral Doppler, color Doppler, and power Doppler as needed of all accessible portions of each vessel. Bilateral testing is considered an integral part of a complete examination. Limited examinations for reoccurring indications may be performed as noted. The reflux portion of the exam is performed with the patient in reverse Trendelenburg.  +---------+---------------+---------+-----------+----------+--------------+ RIGHT    CompressibilityPhasicitySpontaneityPropertiesThrombus Aging +---------+---------------+---------+-----------+----------+--------------+ CFV      Full           Yes      Yes                                 +---------+---------------+---------+-----------+----------+--------------+ SFJ      Full                                                        +---------+---------------+---------+-----------+----------+--------------+ FV Prox  Full                                                        +---------+---------------+---------+-----------+----------+--------------+ FV Mid   Full                                                        +---------+---------------+---------+-----------+----------+--------------+ FV DistalFull                                                        +---------+---------------+---------+-----------+----------+--------------+ PFV      Full                                                        +---------+---------------+---------+-----------+----------+--------------+  POP      Full           Yes      Yes                                 +---------+---------------+---------+-----------+----------+--------------+ PTV      Full                                                        +---------+---------------+---------+-----------+----------+--------------+ PERO     Full                                                         +---------+---------------+---------+-----------+----------+--------------+   +---------+---------------+---------+-----------+----------+--------------+ LEFT     CompressibilityPhasicitySpontaneityPropertiesThrombus Aging +---------+---------------+---------+-----------+----------+--------------+ CFV      Full           Yes      Yes                                 +---------+---------------+---------+-----------+----------+--------------+ SFJ      Full                                                        +---------+---------------+---------+-----------+----------+--------------+ FV Prox  Full                                                        +---------+---------------+---------+-----------+----------+--------------+ FV Mid   Full                                                        +---------+---------------+---------+-----------+----------+--------------+ FV DistalFull                                                        +---------+---------------+---------+-----------+----------+--------------+ PFV      Full                                                        +---------+---------------+---------+-----------+----------+--------------+ POP      Full           Yes      Yes                                 +---------+---------------+---------+-----------+----------+--------------+  PTV      Full                                                        +---------+---------------+---------+-----------+----------+--------------+ PERO     Full                                                        +---------+---------------+---------+-----------+----------+--------------+     Summary: RIGHT: - There is no evidence of deep vein thrombosis in the lower extremity.  - No cystic structure found in the popliteal fossa.  LEFT: - There is no evidence of deep vein thrombosis in the lower extremity.  - No cystic structure found in the popliteal fossa.   *See table(s) above for measurements and observations. Electronically signed by Lonni Gaskins MD on 07/07/2024 at 5:40:37 PM.    Final    EP STUDY Result Date: 07/07/2024 See surgical note for result.  EEG adult Result Date: 07/07/2024 Shelton Arlin KIDD, MD     07/07/2024  8:49 AM Patient Name: Chapel Silverthorn MRN: [id number] Epilepsy Attending: Arlin KIDD Shelton Referring Physician/Provider: Doutova, Anastassia, MD Date: 07/07/2024 Duration: 23.24 mins Patient history: 28yo F with seizure like activity. EEG to evaluate for seizure Level of alertness: Awake AEDs during EEG study: None Technical aspects: This EEG study was done with scalp electrodes positioned according to the 10-20 International system of electrode placement. Electrical activity was reviewed with band pass filter of 1-70Hz , sensitivity of 7 uV/mm, display speed of 106mm/sec with a 60Hz  notched filter applied as appropriate. EEG data were recorded continuously and digitally stored.  Video monitoring was available and reviewed as appropriate. Description: The posterior dominant rhythm consists of 8-9 Hz activity of moderate voltage (25-35 uV) seen predominantly in posterior head regions, symmetric and reactive to eye opening and eye closing. Hyperventilation and photic stimulation were not performed.   IMPRESSION: This study is within normal limits. No seizures or epileptiform discharges were seen throughout the recording. A normal interictal EEG does not exclude the diagnosis of epilepsy. Arlin KIDD Shelton   MR BRAIN WO CONTRAST Result Date: 07/06/2024 CLINICAL DATA:  Provided history: Stroke, follow-up. EXAM: MRI HEAD WITHOUT CONTRAST TECHNIQUE: Multiplanar, multiecho pulse sequences of the brain and surrounding structures were obtained without intravenous contrast. COMPARISON:  Non-contrast head CT and CT angiogram head/neck 07/05/2024. FINDINGS: Brain: Cerebral volume is normal. Acute infarcts within cerebellar vermis and bilateral cerebellar  hemispheres. Most notably, large acute infarcts are present within the superior cerebellar artery territories bilaterally. Posterior fossa mass effect without cerebellar tonsillar herniation or evidence of obstructive hydrocephalus. Petechial hemorrhage within the cerebellar vermis and superior right cerebellar hemisphere. No evidence of an intracranial mass. No extra-axial fluid collection. No midline shift. Vascular: Please refer to the CTA head/neck performed yesterday. Skull and upper cervical spine: No focal worrisome marrow lesion. Sinuses/Orbits: No mass or acute finding within the imaged orbits. No significant paranasal sinus disease These results will be called to the ordering clinician or representative by the Radiologist Assistant, and communication documented in the PACS or [employer]. IMPRESSION: Acute infarcts within the cerebellar vermis and bilateral cerebellar hemispheres. Most notably, large acute infarcts are present  within the superior cerebellar artery territories bilaterally. Posterior fossa mass effect without cerebellar tonsillar herniation or evidence of obstructive hydrocephalus. Petechial hemorrhage within the cerebellar vermis and superior right cerebellar hemisphere. Electronically Signed   By: Rockey Childs D.O.   On: 07/06/2024 18:29   ECHOCARDIOGRAM COMPLETE Result Date: 07/06/2024    ECHOCARDIOGRAM REPORT   Patient Name:   MORGAINE KIMBALL Date of Exam: 07/06/2024 Medical Rec #:  [id number]      Height:       65.0 in Accession #:    [id number]     Weight:       140.0 lb Date of Birth:  Jan 10, 1996     BSA:          1.700 m Patient Age:    28 years       BP:           120/79 mmHg Patient Gender: F              HR:           77 bpm. Exam Location:  Inpatient Procedure: 2D Echo, Color Doppler and Cardiac Doppler (Both Spectral and Color            Flow Doppler were utilized during procedure). Indications:   Stroke i63.9  History:       Patient has no prior history of Echocardiogram  examinations.                Stroke.  Sonographer:   Koleen Popper RDCS Referring      [phone] ANASTASSIA DOUTOVA Phys:  Sonographer Comments: Image acquisition challenging due to uncooperative patient. IMPRESSIONS  1. Left ventricular ejection fraction, by estimation, is 60 to 65%. The left ventricle has normal function. The left ventricle has no regional wall motion abnormalities. Left ventricular diastolic parameters were normal.  2. Right ventricular systolic function is normal. The right ventricular size is normal. There is normal pulmonary artery systolic pressure.  3. The mitral valve is normal in structure. Mild mitral valve regurgitation. No evidence of mitral stenosis.  4. The aortic valve is tricuspid. Aortic valve regurgitation is not visualized. visually opens well but gradient not assessed.  5. The inferior vena cava is normal in size with greater than 50% respiratory variability, suggesting right atrial pressure of 3 mmHg. FINDINGS  Left Ventricle: Left ventricular ejection fraction, by estimation, is 60 to 65%. The left ventricle has normal function. The left ventricle has no regional wall motion abnormalities. The left ventricular internal cavity size was normal in size. There is  no left ventricular hypertrophy. Left ventricular diastolic parameters were normal. Right Ventricle: The right ventricular size is normal. No increase in right ventricular wall thickness. Right ventricular systolic function is normal. There is normal pulmonary artery systolic pressure. The tricuspid regurgitant velocity is 2.06 m/s, and  with an assumed right atrial pressure of 3 mmHg, the estimated right ventricular systolic pressure is 20.0 mmHg. Left Atrium: Left atrial size was normal in size. Right Atrium: Right atrial size was normal in size. Pericardium: There is no evidence of pericardial effusion. Mitral Valve: The mitral valve is normal in structure. Mild mitral valve regurgitation. No evidence of mitral valve  stenosis. Tricuspid Valve: The tricuspid valve is normal in structure. Tricuspid valve regurgitation is mild . No evidence of tricuspid stenosis. Aortic Valve: The aortic valve is tricuspid. Aortic valve regurgitation is not visualized. Visually opens well but gradient not assessed. Pulmonic Valve: The pulmonic valve was normal in structure. Pulmonic  valve regurgitation is trivial. No evidence of pulmonic stenosis. Aorta: The aortic root is normal in size and structure. Venous: The inferior vena cava is normal in size with greater than 50% respiratory variability, suggesting right atrial pressure of 3 mmHg. IAS/Shunts: No atrial level shunt detected by color flow Doppler.  LEFT VENTRICLE PLAX 2D LVIDd:         5.00 cm   Diastology LVIDs:         3.60 cm   LV e' medial:    11.60 cm/s LV PW:         0.70 cm   LV E/e' medial:  6.2 LV IVS:        0.70 cm   LV e' lateral:   13.20 cm/s LVOT diam:     1.90 cm   LV E/e' lateral: 5.5 LV SV:         63 LV SV Index:   37 LVOT Area:     2.84 cm  RIGHT VENTRICLE             IVC RV S prime:     17.30 cm/s  IVC diam: 1.90 cm TAPSE (M-mode): 2.9 cm LEFT ATRIUM             Index        RIGHT ATRIUM           Index LA diam:        3.10 cm 1.82 cm/m   RA Area:     11.60 cm LA Vol (A2C):   44.9 ml 26.41 ml/m  RA Volume:   25.10 ml  14.77 ml/m LA Vol (A4C):   20.6 ml 12.12 ml/m LA Biplane Vol: 30.0 ml 17.65 ml/m  AORTIC VALVE             PULMONIC VALVE LVOT Vmax:   115.00 cm/s PR End Diast Vel: 1.51 msec LVOT Vmean:  73.300 cm/s LVOT VTI:    0.221 m  AORTA Ao Root diam: 2.80 cm Ao Asc diam:  2.70 cm MITRAL VALVE               TRICUSPID VALVE MV Area (PHT): 3.68 cm    TR Peak grad:   17.0 mmHg MV Decel Time: 206 msec    TR Vmax:        206.00 cm/s MR Peak grad: 24.2 mmHg MR Vmax:      246.00 cm/s  SHUNTS MV E velocity: 72.40 cm/s  Systemic VTI:  0.22 m MV A velocity: 86.50 cm/s  Systemic Diam: 1.90 cm MV E/A ratio:  0.84 Franck Azobou Tonleu Electronically signed by Joelle Cedars  Tonleu Signature Date/Time: 07/06/2024/11:37:42 AM    Final    CT ANGIO HEAD NECK W WO CM W PERF (CODE STROKE) Result Date: 07/05/2024 EXAM: CTA Head and Neck with Perfusion 07/05/2024 08:55:08 PM TECHNIQUE: CTA of the head and neck was performed with the administration of intravenous contrast. 3D postprocessing with multiplanar reconstructions and MIPs was performed to evaluate the vascular anatomy. Automated exposure control, iterative reconstruction, and/or weight based adjustment of the mA/kV was utilized to reduce the radiation dose to as low as reasonably achievable. COMPARISON: 07/05/2024 head CT CLINICAL HISTORY: Neuro deficit, acute, stroke suspected; ataxia, slurred speech, new small cerebellar stroke on CTH. Triage notes: seizure activity stated by her roommate. Pt described as overly emotional, was rolling around and throwing her hands around, keeps writing in a diary she has. Did not hit her head, no oral injury, no  loss of urine. A\T\Ox4. VS stable. FINDINGS: CTA NECK: AORTIC ARCH AND ARCH VESSELS: No dissection or arterial injury. No significant stenosis of the brachiocephalic or subclavian arteries. CERVICAL CAROTID ARTERIES: No dissection, arterial injury, or hemodynamically significant stenosis by NASCET criteria. CERVICAL VERTEBRAL ARTERIES: No opacification of the left vertebral artery distal V1 and proximal V2 segments. There is reconstitution at the distal V2 segment but enhancement remains asymmetrically decreased throughout the remainder of the course of the vertebral artery. The right vertebral artery is normal. No proximal occlusion visualized of the inferior cerebellar arteries. LUNGS AND MEDIASTINUM: Unremarkable. SOFT TISSUES: No acute abnormality. BONES: No acute abnormality. CTA HEAD: ANTERIOR CIRCULATION: No significant stenosis of the internal carotid arteries. No significant stenosis of the anterior cerebral arteries. No significant stenosis of the middle cerebral arteries. No  aneurysm. POSTERIOR CIRCULATION: No significant stenosis of the posterior cerebral arteries. No significant stenosis of the basilar artery. No significant stenosis of the vertebral arteries. No aneurysm. OTHER: No dural venous sinus thrombosis on this non-dedicated study. CT PERFUSION: Fusion imaging shows a 13 mL region of ischemia affecting both cerebellar hemispheres but worse on the left. No core infarct. EXAM QUALITY: Exam quality is adequate with diagnostic perfusion maps. No significant motion artifact. Appropriate arterial inflow and venous outflow curves. CORE INFARCT (CBF<30% volume): 0 mL TOTAL HYPOPERFUSION (Tmax>6s volume): 13 mL PENUMBRA: Mismatch volume: 13 mL Mismatch ratio: Not applicable Location: Cerebellar hemispheres, worse on the left. IMPRESSION: 1. Proximal left vertebral artery dissection. Distal v2 segment reconstitution but with attenuated enhancement relative to the contralateral side along the remainder of its course. 2. A 13 mL region of ischemia affecting both cerebellar hemispheres, worse on the left, with no core infarct. Findings discussed with Dr. Caron Salt at 09:05 pm on 07/05/2024 Electronically signed by: Franky Stanford MD 07/05/2024 09:07 PM EDT RP Workstation: [id number]   CT Head Wo Contrast Result Date: 07/05/2024 EXAM: CT HEAD WITHOUT CONTRAST 07/05/2024 06:33:51 PM TECHNIQUE: CT of the head was performed without the administration of intravenous contrast. Automated exposure control, iterative reconstruction, and/or weight based adjustment of the mA/kV was utilized to reduce the radiation dose to as low as reasonably achievable. COMPARISON: 08/24/2019 CLINICAL HISTORY: Seizure, new-onset, no history of trauma. Per chart: Pt BIB ems for seizure activity stated by her roommate. Pt described as overly emotional, was rolling around and throwing her hands around, keeps writing in a diary she has. Did not hit her head, no oral injury, no loss of urine. FINDINGS: BRAIN AND  VENTRICLES: No acute hemorrhage. No evidence of acute infarct. No hydrocephalus. No extra-axial collection. No mass effect or midline shift. Old right cerebellar infarct but new since the prior study. ORBITS: No acute abnormality. SINUSES: No acute abnormality. SOFT TISSUES AND SKULL: No acute soft tissue abnormality. No skull fracture. IMPRESSION: 1. No acute intracranial abnormality. 2. New right cerebellar infarct since the prior study (2020), but likely chronic. Electronically signed by: Franky Stanford MD 07/05/2024 06:49 PM EDT RP Workstation: [id number]   DG Chest Portable 1 View Result Date: 07/05/2024 EXAM: 1 VIEW XRAY OF THE CHEST 07/05/2024 05:36:00 PM COMPARISON: 12/31/2017 CLINICAL HISTORY: ams. Per chart: Pt BIB ems for seizure activity stated by her roommate. Pt described as overly emotional, was rolling around and throwing her hands around, keeps writing in a diary she has.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

## 2024-07-09 LAB — CBC WITH DIFFERENTIAL/PLATELET
Abs Immature Granulocytes: 0.03 K/uL (ref 0.00–0.07)
Basophils Absolute: 0.1 K/uL (ref 0.0–0.1)
Basophils Relative: 1 %
Eosinophils Absolute: 0.2 K/uL (ref 0.0–0.5)
Eosinophils Relative: 2 %
HCT: 39 % (ref 36.0–46.0)
Hemoglobin: 12.9 g/dL (ref 12.0–15.0)
Immature Granulocytes: 0 %
Lymphocytes Relative: 25 %
Lymphs Abs: 2 K/uL (ref 0.7–4.0)
MCH: 25.7 pg — ABNORMAL LOW (ref 26.0–34.0)
MCHC: 33.1 g/dL (ref 30.0–36.0)
MCV: 77.7 fL — ABNORMAL LOW (ref 80.0–100.0)
Monocytes Absolute: 0.8 K/uL (ref 0.1–1.0)
Monocytes Relative: 10 %
Neutro Abs: 5 K/uL (ref 1.7–7.7)
Neutrophils Relative %: 62 %
Platelets: 125 K/uL — ABNORMAL LOW (ref 150–400)
RBC: 5.02 MIL/uL (ref 3.87–5.11)
RDW: 14.7 % (ref 11.5–15.5)
WBC: 8.1 K/uL (ref 4.0–10.5)
nRBC: 0 % (ref 0.0–0.2)

## 2024-07-09 LAB — MAGNESIUM: Magnesium: 1.8 mg/dL (ref 1.7–2.4)

## 2024-07-09 LAB — COMPREHENSIVE METABOLIC PANEL WITH GFR
ALT: 31 U/L (ref 0–44)
AST: 19 U/L (ref 15–41)
Albumin: 3.4 g/dL — ABNORMAL LOW (ref 3.5–5.0)
Alkaline Phosphatase: 62 U/L (ref 38–126)
Anion gap: 10 (ref 5–15)
BUN: 10 mg/dL (ref 6–20)
CO2: 22 mmol/L (ref 22–32)
Calcium: 8.9 mg/dL (ref 8.9–10.3)
Chloride: 111 mmol/L (ref 98–111)
Creatinine, Ser: 1.02 mg/dL — ABNORMAL HIGH (ref 0.44–1.00)
GFR, Estimated: >60 mL/min (ref >60)
Glucose, Bld: 80 mg/dL (ref 70–99)
Potassium: 3.5 mmol/L (ref 3.5–5.1)
Sodium: 143 mmol/L (ref 135–145)
Total Bilirubin: 0.6 mg/dL (ref 0.0–1.2)
Total Protein: 6.6 g/dL (ref 6.5–8.1)

## 2024-07-09 LAB — WET PREP, GENITAL
Clue Cells Wet Prep HPF POC: NONE SEEN
Sperm: NONE SEEN
Trich, Wet Prep: NONE SEEN
WBC, Wet Prep HPF POC: <10 (ref <10)
Yeast Wet Prep HPF POC: NONE SEEN

## 2024-07-09 LAB — HIV ANTIBODY (ROUTINE TESTING W REFLEX): HIV Screen 4th Generation wRfx: NONREACTIVE

## 2024-07-09 LAB — VITAMIN B1: Vitamin B1 (Thiamine): 128.6 nmol/L (ref 66.5–200.0)

## 2024-07-09 LAB — PHOSPHORUS: Phosphorus: 3.5 mg/dL (ref 2.5–4.6)

## 2024-07-09 MED ORDER — POLYETHYLENE GLYCOL 3350 17 G PO PACK
17.0000 g | PACK | Freq: Two times a day (BID) | ORAL | Status: DC
  Administered 2024-07-09 – 2024-07-10 (×2): 17 g via ORAL
  Filled 2024-07-09 (×4): qty 1

## 2024-07-09 MED ORDER — LIDOCAINE-EPINEPHRINE 1 %-1:100000 IJ SOLN
10.0000 mL | Freq: Once | INTRAMUSCULAR | Status: AC
  Administered 2024-07-09: 1 mL via INTRADERMAL
  Filled 2024-07-09: qty 20

## 2024-07-09 MED ORDER — THIAMINE MONONITRATE 100 MG PO TABS
100.0000 mg | ORAL_TABLET | Freq: Every day | ORAL | Status: DC
  Administered 2024-07-09 – 2024-07-13 (×5): 100 mg via ORAL
  Filled 2024-07-09 (×5): qty 1

## 2024-07-09 NOTE — Plan of Care (Signed)

## 2024-07-09 NOTE — Plan of Care (Signed)
  Problem: Ischemic Stroke/TIA Tissue Perfusion: Goal: Complications of ischemic stroke/TIA will be minimized Outcome: Progressing   Problem: Coping: Goal: Will verbalize positive feelings about self Outcome: Progressing   Problem: Health Behavior/Discharge Planning: Goal: Ability to manage health-related needs will improve Outcome: Progressing   Problem: Self-Care: Goal: Ability to communicate needs accurately will improve Outcome: Progressing   Problem: Nutrition: Goal: Risk of aspiration will decrease Outcome: Progressing   Problem: Clinical Measurements: Goal: Will remain free from infection Outcome: Progressing   Problem: Activity: Goal: Risk for activity intolerance will decrease Outcome: Progressing   Problem: Nutrition: Goal: Adequate nutrition will be maintained Outcome: Progressing   Problem: Pain Managment: Goal: General experience of comfort will improve and/or be controlled Outcome: Progressing   Problem: Safety: Goal: Ability to remain free from injury will improve Outcome: Progressing   Problem: Skin Integrity: Goal: Risk for impaired skin integrity will decrease Outcome: Progressing

## 2024-07-09 NOTE — Procedures (Signed)
   GYNECOLOGY PROCEDURE NOTE  Cassidy Bruce is a 28 y.o. admitted for stroke; has Nexplanon  in place that patient requests removal and neurology recommended removal. Nexplanon  is MEC 3 for continuation in the setting of acute stroke.   Nexplanon  Removal Patient identified, informed consent performed with discussed of usual risks/benefits. Reviewed that bruising is to be expected with removal. Theoretical but likely low risk of hematoma due to antiplatelet medications. Written consent obtained.  Nexplanon  site identified in the left arm.  Area prepped in usual sterile fashon. One ml of 1% lidocaine  w/ epinephrine  was used to anesthetize the area at the distal end of the implant. A small stab incision was made right beside the implant on the distal portion. The Nexplanon  was successfully removed using pop out technique.  There was minimal blood loss. There were no complications.  Steri-strips were applied over the small incision.  A pressure bandage was applied to reduce any bruising.    - Maintain pressure dressing x 24 hours (until 9/7 at 11:45am). Patient should not get pressure dressing wet.  - Steri strips to remain in place x 7 days. May shower/get steris wet. If they fall off spontaneously prior to 7 days they do not need to be replaced.  - Contraceptive options were not reviewed with the patient today in the context of her acute illness. She may follow up outpatient for contraceptive counseling as needed.   Please call [phone] [facility] OB[facility] Consult Attending Monday-Friday 8am - 5pm) or [phone] [facility] OB[facility] Attending On Call all day, every day) if any new gynecologic concerns arise.   Kieth Carolin, MD Obstetrician & Gynecologist, [facility] for [employer], [facility]

## 2024-07-09 NOTE — Progress Notes (Signed)
 PHARMACIST - PHYSICIAN COMMUNICATION  DR:   Perri  CONCERNING: IV to Oral Route Change Policy  RECOMMENDATION: This patient is receiving thiamine  by the intravenous route.  Based on criteria approved by the Pharmacy and Therapeutics Committee, the intravenous medication(s) is/are being converted to the equivalent oral dose form(s).   DESCRIPTION: These criteria include: The patient is eating (either orally or via tube) and/or has been taking other orally administered medications for a least 24 hours The patient has no evidence of active gastrointestinal bleeding or impaired GI absorption (gastrectomy, short bowel, patient on TNA or NPO).  If you have questions about this conversion, please contact the Pharmacy Department  []   [phone] )  [facility] []   [phone] )  [facility] [x]   [phone] )  [facility] []   [phone] )  [facility] []   [phone] )  [facility]

## 2024-07-09 NOTE — Progress Notes (Signed)
 PROGRESS NOTE    Cassidy Bruce  FMW:[id number] DOB: [birth year] Bruce DOA: 07/05/2024 PCP: Pcp, No  Chief Complaint  Patient presents with   Seizures    Brief Narrative:   Cassidy Bruce is Cassidy Bruce 28 year old with Cassidy Bruce past medical history significant for ADHD and depression who presented to the hospital with seizure-like activity and has been having intermittent headaches. Yesterday around 3:30 PM she became unresponsive was noted to be shaking all over and would not follow commands or answer questions. She was brought to the ED for further evaluation and acetaminophen  and salicylate levels were negative and alcohol was negative. She appeared ataxic and CTA of the head neck was done and showed Cassidy Bruce proximal left vertebral artery dissection. Neuro was consulted and recommended stroke workup and transferring the the patient to [facility] for further evaluation. He continues to complain of severe headache and continues to have incoordination, imbalance and slurred speech.   Assessment & Plan:   Principal Problem:   Stroke [facility]) Active Problems:   Hypokalemia   PFO (patent foramen ovale)   Vertebral artery dissection (HCC)  Acute Stroke MRI with acute infarcts within the cerebellar vermis and bilateral cerebellar hemispheres.  Large acute infarcts present within the superior cerebellar artery territories bilaterally.  Posterior fossa mass effect without cerebellar tonsillar herniation or evidence of obstructive hydrocephalus.  Petechial hemorrhage within the cerebellar vermis and suerior R cerebellar hemisphere. CTA head/neck with proximal L vertebral artery dissection, 13 ml region of ischemia affecting both cerebellar hemispheres, worse on L TEE with PFO  Transcranial doppler with PFO spencer grade II lower end of scale at rest and higher end of scale with valsava LE US  negative for DVT Echo with EF 60-65%, no RWMA LDL 104, A1c 5.4 ANA negative, cardiolipin ab negative, serum homecysteine wnl, beta 2  glycoprotein ab wnl, lupus anticoagulant not detected.  vitamin b1 wnl.  Appreciate neurology assistance - bilateral cerebellar infarct, embolic vs L VA occlusion/dissection - recommending DAPT x3 months, then ASA alone.  Crestor  20 mg daily (stop if pregnant or planning pregnancy).  To be referred to cards for consideration of PFO closure.  Dyslipidemia Statin   Acute Kidney Injury Very mild, trend  Elevated LFT's Noted, workup further as indicated  Leukocytosis resolved  Microcytosis Iron studies, will start every other day iron  Thrombocytopenia Noted, will trend  Mood Disorder Will discuss medication for depression, will consider later in her course Denies SI  Complex Family Dynamics At this point in time, she's deferring to her friend Cassidy Bruce  Birth Control  Nexplanon  in place (L arm) - appears it was placed 08/2020 Nexplanon  d/c'd 9/6 Will need to follow outpatient with gyn for future contraceptive counseling  Homelessness TOC, appreciate assistance    DVT prophylaxis: SCD Code Status: full Family Communication: mother, aunt, friend Disposition:   Status is: Inpatient Remains inpatient appropriate because: need for continued inpatient care   Consultants:  Neurology cards  Procedures:  LE US  Summary:  RIGHT:      - There is no evidence of deep vein thrombosis in the lower extremity.    - No cystic structure found in the popliteal fossa.    LEFT:      - There is no evidence of deep vein thrombosis in the lower extremity.    - No cystic structure found in the popliteal fossa.   TEE IMPRESSIONS     1. Left ventricular ejection fraction, by estimation, is 60 to 65%. The  left ventricle has normal function.  2. Right ventricular systolic function is normal. The right ventricular  size is normal.   3. No left atrial/left atrial appendage thrombus was detected. The LAA  emptying velocity was 64 cm/s.   4. Distal anterior mitral leaflet  tip appears to have minimal prolapse of  fibrinous appearing structures. The mitral valve is abnormal. Trivial  mitral valve regurgitation. No evidence of mitral stenosis.   5. The aortic valve is tricuspid. Aortic valve regurgitation is not  visualized. No aortic stenosis is present.   6. Evidence of atrial level shunting detected by color flow Doppler.  Agitated saline contrast bubble study was positive with shunting observed  within 3-6 cardiac cycles suggestive of interatrial shunt. There is Zair Borawski  small patent foramen ovale with  bidirectional shunting across atrial septum.   7. 3D performed of the mitral valve and 3D performed of the atrial septum  and demonstrates Distal anterior mitral leaflet tip appears to have  minimal prolapse of fibrinous appearing structures. PFO visualized.   Conclusion(s)/Recommendation(s): PFO seen by color Doppler (left to right  shunt). Agitated saline at rest was late positive for shunt, but agitated  saline with abdominal pressure (Valsalva) was rapidly positive, consistent  with right to left intra-atrial   shunt.   Echo IMPRESSIONS     1. Left ventricular ejection fraction, by estimation, is 60 to 65%. The  left ventricle has normal function. The left ventricle has no regional  wall motion abnormalities. Left ventricular diastolic parameters were  normal.   2. Right ventricular systolic function is normal. The right ventricular  size is normal. There is normal pulmonary artery systolic pressure.   3. The mitral valve is normal in structure. Mild mitral valve  regurgitation. No evidence of mitral stenosis.   4. The aortic valve is tricuspid. Aortic valve regurgitation is not  visualized. visually opens well but gradient not assessed.   5. The inferior vena cava is normal in size with greater than 50%  respiratory variability, suggesting right atrial pressure of 3 mmHg.   Antimicrobials:  Anti-infectives (From admission, onward)    None        Subjective: Asleep when I came by Denied any concerns  Objective: Vitals:   07/08/24 1943 07/08/24 2309 07/09/24 0325 07/09/24 0821  BP: (!) 123/91 111/75 120/81 110/68  Pulse: 67 71 69 65  Resp: 18 16 18 20   Temp: 98 F (36.7 C) 98.1 F (36.7 C) 98.1 F (36.7 C) 98.3 F (36.8 C)  TempSrc: Oral Oral Oral Oral  SpO2: 100% 99% 98% 98%    Intake/Output Summary (Last 24 hours) at 07/09/2024 1605 Last data filed at 07/09/2024 0600 Gross per 24 hour  Intake 300 ml  Output 250 ml  Net 50 ml   There were no vitals filed for this visit.  Examination:  General: No acute distress.  Sleeping Lungs: unlabored Neurological: sleeping, awakens appropriately Extremities: No clubbing or cyanosis. No edema.  Data Reviewed: I have personally reviewed following labs and imaging studies  CBC: Recent Labs  Lab 07/05/24 1653 07/06/24 0625 07/07/24 0317 07/09/24 0355  WBC 7.6 12.0* 10.8* 8.1  NEUTROABS 5.2  --  7.8* 5.0  HGB 13.3 12.8 13.1 12.9  HCT 40.1 39.4 40.1 39.0  MCV 77.7* 77.9* 78.9* 77.7*  PLT 138* 125* 156 125*    Basic Metabolic Panel: Recent Labs  Lab 07/05/24 1653 07/06/24 0625 07/07/24 0317 07/09/24 0355  NA 142 139 143 143  K 3.2* 4.6 4.0 3.5  CL 107 106  110 111  CO2 21* 21* 24 Bruce  GLUCOSE 123* 90 105* 80  BUN 9 8 8 10   CREATININE 0.79 0.70 1.06* 1.02*  CALCIUM  9.0 9.2 8.5* 8.9  MG 2.1  --  1.9 1.8  PHOS 2.2*  --  3.0 3.5    GFR: CrCl cannot be calculated (Unknown ideal weight.).  Liver Function Tests: Recent Labs  Lab 07/05/24 1653 07/06/24 0625 07/07/24 0317 07/09/24 0355  AST 38 36 33 19  ALT 58* 60* 48* 31  ALKPHOS 78 77 62 62  BILITOT 0.5 0.4 0.5 0.6  PROT 7.1 6.9 6.4* 6.6  ALBUMIN 4.3 4.1 3.3* 3.4*    CBG: Recent Labs  Lab 07/05/24 1705  GLUCAP 97     No results found for this or any previous visit (from the past 240 hours).       Radiology Studies: VAS US  TRANSCRANIAL DOPPLER W BUBBLES Result Date: 07/08/2024   Transcranial Doppler with Bubble Patient Name:  Cassidy Bruce  Date of Exam:   07/08/2024 Medical Rec #: [id number]       Accession #:    [id number] Date of Birth: Cassidy Bruce, Cassidy Bruce      Patient Gender: F Patient Age:   61 years Exam Location:  [facility] Procedure:      VAS US  TRANSCRANIAL DOPPLER W BUBBLES Referring Phys: BRIDGETTE CHRISTOPHER --------------------------------------------------------------------------------  Indications: Stroke. History: TEE: PFO seen by color Doppler (left to right shunt). Agitated saline at rest was late positive for shunt, but agitated saline with abdominal pressure (Valsalva) was rapidly positive, consistent with right to left intra-atrial shunt.  Comparison Study: No prior study on file Performing Technologist: Alberta Lis RVS  Examination Guidelines: [place] complete evaluation includes B-mode imaging, spectral Doppler, color Doppler, and power Doppler as needed of all accessible portions of each vessel. Bilateral testing is considered an integral part of Cassidy Bruce complete examination. Limited examinations for reoccurring indications may be performed as noted.  Summary:  Nahla Lukin vascular evaluation was performed. The right middle cerebral artery was studied. An IV was inserted into the patient's left forearm. Verbal informed consent was obtained.  TCD bubble study indicative of PFO [name] Grade III lower end of scale at rest, and higher end of scale with Valsalva maneuver.   Diagnosing physician: Cassidy Bruce Electronically signed by Cassidy Bruce on 07/08/2024 at 6:10:40 PM.    Final    VAS US  LOWER EXTREMITY VENOUS (DVT) Result Date: 07/07/2024  Lower Venous DVT Study Patient Name:  Cassidy Bruce  Date of Exam:   07/07/2024 Medical Rec #: [id number]       Accession #:    [id number] Date of Birth: Cassidy [birth year]      Patient Gender: F Patient Age:   89 years Exam Location:  [facility] Procedure:      VAS US  LOWER EXTREMITY VENOUS (DVT) Referring Phys: Cassidy CUMMINS  --------------------------------------------------------------------------------  Indications: Stroke.  Risk Factors: None identified. Comparison Study: No prior studies. Performing Technologist: Cassidy Bruce  Examination Guidelines: Nalaya Wojdyla complete evaluation includes B-mode imaging, spectral Doppler, color Doppler, and power Doppler as needed of all accessible portions of each vessel. Bilateral testing is considered an integral part of Cassidy Bruce complete examination. Limited examinations for reoccurring indications may be performed as noted. The reflux portion of the exam is performed with the patient in reverse Trendelenburg.  +---------+---------------+---------+-----------+----------+--------------+ RIGHT    CompressibilityPhasicitySpontaneityPropertiesThrombus Aging +---------+---------------+---------+-----------+----------+--------------+ CFV      Full           Yes  Yes                                 +---------+---------------+---------+-----------+----------+--------------+ SFJ      Full                                                        +---------+---------------+---------+-----------+----------+--------------+ FV Prox  Full                                                        +---------+---------------+---------+-----------+----------+--------------+ FV Mid   Full                                                        +---------+---------------+---------+-----------+----------+--------------+ FV DistalFull                                                        +---------+---------------+---------+-----------+----------+--------------+ PFV      Full                                                        +---------+---------------+---------+-----------+----------+--------------+ POP      Full           Yes      Yes                                 +---------+---------------+---------+-----------+----------+--------------+ PTV      Full                                                         +---------+---------------+---------+-----------+----------+--------------+ PERO     Full                                                        +---------+---------------+---------+-----------+----------+--------------+   +---------+---------------+---------+-----------+----------+--------------+ LEFT     CompressibilityPhasicitySpontaneityPropertiesThrombus Aging +---------+---------------+---------+-----------+----------+--------------+ CFV      Full           Yes      Yes                                 +---------+---------------+---------+-----------+----------+--------------+ SFJ      Full                                                        +---------+---------------+---------+-----------+----------+--------------+  FV Prox  Full                                                        +---------+---------------+---------+-----------+----------+--------------+ FV Mid   Full                                                        +---------+---------------+---------+-----------+----------+--------------+ FV DistalFull                                                        +---------+---------------+---------+-----------+----------+--------------+ PFV      Full                                                        +---------+---------------+---------+-----------+----------+--------------+ POP      Full           Yes      Yes                                 +---------+---------------+---------+-----------+----------+--------------+ PTV      Full                                                        +---------+---------------+---------+-----------+----------+--------------+ PERO     Full                                                        +---------+---------------+---------+-----------+----------+--------------+     Summary: RIGHT: - There is no evidence of deep vein thrombosis in the  lower extremity.  - No cystic structure found in the popliteal fossa.  LEFT: - There is no evidence of deep vein thrombosis in the lower extremity.  - No cystic structure found in the popliteal fossa.  *See table(s) above for measurements and observations. Electronically signed by Cassidy Bruce on 07/07/2024 at 5:40:37 PM.    Final         Scheduled Meds:  acetaminophen   1,000 mg Oral Q8H   aspirin  EC  81 mg Oral Daily   clopidogrel   75 mg Oral Daily   ferrous sulfate   325 mg Oral QODAY   rosuvastatin   20 mg Oral Daily   thiamine   100 mg Oral Daily   Continuous Infusions:  sodium chloride  75 mL/hr at 07/07/24 1353     LOS: 4 days    Time spent: over 30 min     Cassidy Monte, [facility]   To  contact the attending provider between 7A-7P or the covering provider during after hours 7P-7A, please log into the web site [email or web address] and access using universal [place] password for that web site. If you do not have the password, please call the hospital operator.  07/09/2024, 4:05 PM

## 2024-07-10 DIAGNOSIS — I63212 Cerebral infarction due to unspecified occlusion or stenosis of left vertebral arteries: Secondary | ICD-10-CM

## 2024-07-10 LAB — PHOSPHORUS: Phosphorus: 4 mg/dL (ref 2.5–4.6)

## 2024-07-10 LAB — CBC WITH DIFFERENTIAL/PLATELET
Abs Immature Granulocytes: 0.01 K/uL (ref 0.00–0.07)
Basophils Absolute: 0.1 K/uL (ref 0.0–0.1)
Basophils Relative: 1 %
Eosinophils Absolute: 0.3 K/uL (ref 0.0–0.5)
Eosinophils Relative: 4 %
HCT: 39.3 % (ref 36.0–46.0)
Hemoglobin: 13 g/dL (ref 12.0–15.0)
Immature Granulocytes: 0 %
Lymphocytes Relative: 28 %
Lymphs Abs: 2.1 K/uL (ref 0.7–4.0)
MCH: 25.6 pg — ABNORMAL LOW (ref 26.0–34.0)
MCHC: 33.1 g/dL (ref 30.0–36.0)
MCV: 77.4 fL — ABNORMAL LOW (ref 80.0–100.0)
Monocytes Absolute: 0.7 K/uL (ref 0.1–1.0)
Monocytes Relative: 9 %
Neutro Abs: 4.3 K/uL (ref 1.7–7.7)
Neutrophils Relative %: 58 %
Platelets: 135 K/uL — ABNORMAL LOW (ref 150–400)
RBC: 5.08 MIL/uL (ref 3.87–5.11)
RDW: 14.7 % (ref 11.5–15.5)
WBC: 7.3 K/uL (ref 4.0–10.5)
nRBC: 0 % (ref 0.0–0.2)

## 2024-07-10 LAB — COMPREHENSIVE METABOLIC PANEL WITH GFR
ALT: 25 U/L (ref 0–44)
AST: 18 U/L (ref 15–41)
Albumin: 3.4 g/dL — ABNORMAL LOW (ref 3.5–5.0)
Alkaline Phosphatase: 69 U/L (ref 38–126)
Anion gap: 13 (ref 5–15)
BUN: 11 mg/dL (ref 6–20)
CO2: 23 mmol/L (ref 22–32)
Calcium: 9 mg/dL (ref 8.9–10.3)
Chloride: 108 mmol/L (ref 98–111)
Creatinine, Ser: 0.96 mg/dL (ref 0.44–1.00)
GFR, Estimated: >60 mL/min (ref >60)
Glucose, Bld: 85 mg/dL (ref 70–99)
Potassium: 3.6 mmol/L (ref 3.5–5.1)
Sodium: 144 mmol/L (ref 135–145)
Total Bilirubin: 0.7 mg/dL (ref 0.0–1.2)
Total Protein: 6.7 g/dL (ref 6.5–8.1)

## 2024-07-10 LAB — RPR: RPR Ser Ql: NONREACTIVE

## 2024-07-10 LAB — MAGNESIUM: Magnesium: 2 mg/dL (ref 1.7–2.4)

## 2024-07-10 MED ORDER — POTASSIUM CHLORIDE CRYS ER 20 MEQ PO TBCR
40.0000 meq | EXTENDED_RELEASE_TABLET | Freq: Once | ORAL | Status: AC
  Administered 2024-07-10: 40 meq via ORAL
  Filled 2024-07-10: qty 2

## 2024-07-10 MED ORDER — CITALOPRAM HYDROBROMIDE 10 MG PO TABS
20.0000 mg | ORAL_TABLET | Freq: Every day | ORAL | Status: DC
  Administered 2024-07-10 – 2024-07-13 (×4): 20 mg via ORAL
  Filled 2024-07-10 (×4): qty 2

## 2024-07-10 MED ORDER — BISACODYL 10 MG RE SUPP
10.0000 mg | Freq: Once | RECTAL | Status: AC
  Administered 2024-07-10: 10 mg via RECTAL
  Filled 2024-07-10: qty 1

## 2024-07-10 NOTE — Plan of Care (Signed)

## 2024-07-10 NOTE — Progress Notes (Addendum)
 PROGRESS NOTE    Adeleigh Bruce  FMW:[id number] DOB: 08/23/1996 DOA: 07/05/2024 PCP: Pcp, No  Chief Complaint  Patient presents with   Seizures    Brief Narrative:   Mrs. Fix is Cassidy Bruce 28 year old with [name] past medical history significant for ADHD and depression who presented to the hospital with seizure-like activity and has been having intermittent headaches. Yesterday around 3:30 PM she became unresponsive was noted to be shaking all over and would not follow commands or answer questions. She was brought to the ED for further evaluation and acetaminophen  and salicylate levels were negative and alcohol was negative. She appeared ataxic and CTA of the head neck was done and showed Artist Bloom proximal left vertebral artery dissection. Neuro was consulted and recommended stroke workup and transferring the the patient to [place] for further evaluation. He continues to complain of severe headache and continues to have incoordination, imbalance and slurred speech.   Assessment & Plan:   Principal Problem:   Stroke [facility]) Active Problems:   Hypokalemia   PFO (patent foramen ovale)   Vertebral artery dissection (HCC)  Acute Stroke MRI with acute infarcts within the cerebellar vermis and bilateral cerebellar hemispheres.  Large acute infarcts present within the superior cerebellar artery territories bilaterally.  Posterior fossa mass effect without cerebellar tonsillar herniation or evidence of obstructive hydrocephalus.  Petechial hemorrhage within the cerebellar vermis and suerior R cerebellar hemisphere. CTA head/neck with proximal L vertebral artery dissection, 13 ml region of ischemia affecting both cerebellar hemispheres, worse on L TEE with PFO  Transcranial doppler with PFO spencer grade II lower end of scale at rest and higher end of scale with valsava LE US  negative for DVT Echo with EF 60-65%, no RWMA LDL 104, A1c 5.4 ANA negative, cardiolipin ab negative, serum homecysteine wnl, beta 2  glycoprotein ab wnl, lupus anticoagulant not detected.  vitamin b1 wnl.  Appreciate neurology assistance - bilateral cerebellar infarct, embolic vs L VA occlusion/dissection - recommending DAPT x3 months, then ASA alone.  Crestor  20 mg daily (stop if pregnant or planning pregnancy).  To be referred to cards for consideration of PFO closure.  NSVT Normal EF Noted, follow on tele  Replace lytes as needed  Dyslipidemia Statin   Acute Kidney Injury Very mild, trend  Elevated LFT's Noted, workup further as indicated  Leukocytosis resolved  Microcytosis Low normal ferritin, start every other day iron   Thrombocytopenia Noted, will trend  Mood Disorder Will start celexa  today Denies SI  Complex Family Dynamics At this point in time, she's deferring to her friend [name]  Birth Control  Nexplanon  in place (L arm) - appears it was placed 08/2020 Nexplanon  d/c'd 9/6 Will need to follow outpatient with gyn for future contraceptive counseling  Homelessness TOC, appreciate assistance    DVT prophylaxis: SCD Code Status: full Family Communication: mother, aunt, friend Disposition:   Status is: Inpatient Remains inpatient appropriate because: need for continued inpatient care   Consultants:  Neurology cards  Procedures:  LE US  Summary:  RIGHT:      - There is no evidence of deep vein thrombosis in the lower extremity.    - No cystic structure found in the popliteal fossa.    LEFT:      - There is no evidence of deep vein thrombosis in the lower extremity.    - No cystic structure found in the popliteal fossa.   TEE IMPRESSIONS     1. Left ventricular ejection fraction, by estimation, is 60 to 65%. The  left ventricle has normal function.   2. Right ventricular systolic function is normal. The right ventricular  size is normal.   3. No left atrial/left atrial appendage thrombus was detected. The LAA  emptying velocity was 64 cm/s.   4. Distal  anterior mitral leaflet tip appears to have minimal prolapse of  fibrinous appearing structures. The mitral valve is abnormal. Trivial  mitral valve regurgitation. No evidence of mitral stenosis.   5. The aortic valve is tricuspid. Aortic valve regurgitation is not  visualized. No aortic stenosis is present.   6. Evidence of atrial level shunting detected by color flow Doppler.  Agitated saline contrast bubble study was positive with shunting observed  within 3-6 cardiac cycles suggestive of interatrial shunt. There is Cassidy Bruce  small patent foramen ovale with  bidirectional shunting across atrial septum.   7. 3D performed of the mitral valve and 3D performed of the atrial septum  and demonstrates Distal anterior mitral leaflet tip appears to have  minimal prolapse of fibrinous appearing structures. PFO visualized.   Conclusion(s)/Recommendation(s): PFO seen by color Doppler (left to right  shunt). Agitated saline at rest was late positive for shunt, but agitated  saline with abdominal pressure (Valsalva) was rapidly positive, consistent  with right to left intra-atrial   shunt.   Echo IMPRESSIONS     1. Left ventricular ejection fraction, by estimation, is 60 to 65%. The  left ventricle has normal function. The left ventricle has no regional  wall motion abnormalities. Left ventricular diastolic parameters were  normal.   2. Right ventricular systolic function is normal. The right ventricular  size is normal. There is normal pulmonary artery systolic pressure.   3. The mitral valve is normal in structure. Mild mitral valve  regurgitation. No evidence of mitral stenosis.   4. The aortic valve is tricuspid. Aortic valve regurgitation is not  visualized. visually opens well but gradient not assessed.   5. The inferior vena cava is normal in size with greater than 50%  respiratory variability, suggesting right atrial pressure of 3 mmHg.   Antimicrobials:  Anti-infectives (From admission,  onward)    None       Subjective: No new complaints Mother at bedside  Objective: Vitals:   07/09/24 1946 07/10/24 0010 07/10/24 0727 07/10/24 1249  BP: (!) 109/95 110/68 95/62 124/89  Pulse: 71 67 72 69  Resp: 18 19 16 18   Temp: 98.1 F (36.7 C) (!) 97.5 F (36.4 C) 99 F (37.2 C) 98 F (36.7 C)  TempSrc: Oral Oral Oral Oral  SpO2: 99% 98% 99% 100%    Intake/Output Summary (Last 24 hours) at 07/10/2024 1319 Last data filed at 07/09/2024 2300 Gross per 24 hour  Intake 30 ml  Output --  Net 30 ml   There were no vitals filed for this visit.  Examination:  General: No acute distress. Lungs: unlabored Neurological: dysarthria, ataxia  Extremities: No clubbing or cyanosis. No edema.  Data Reviewed: I have personally reviewed following labs and imaging studies  CBC: Recent Labs  Lab 07/05/24 1653 07/06/24 0625 07/07/24 0317 07/09/24 0355 07/10/24 0344  WBC 7.6 12.0* 10.8* 8.1 7.3  NEUTROABS 5.2  --  7.8* 5.0 4.3  HGB 13.3 12.8 13.1 12.9 13.0  HCT 40.1 39.4 40.1 39.0 39.3  MCV 77.7* 77.9* 78.9* 77.7* 77.4*  PLT 138* 125* 156 125* 135*    Basic Metabolic Panel: Recent Labs  Lab 07/05/24 1653 07/06/24 0625 07/07/24 0317 07/09/24 0355 07/10/24 0344  NA 142  139 143 143 144  K 3.2* 4.6 4.0 3.5 3.6  CL 107 106 110 111 108  CO2 21* 21* 24 22 23   GLUCOSE 123* 90 105* 80 85  BUN 9 8 8 10 11   CREATININE 0.79 0.70 1.06* 1.02* 0.96  CALCIUM  9.0 9.2 8.5* 8.9 9.0  MG 2.1  --  1.9 1.8 2.0  PHOS 2.2*  --  3.0 3.5 4.0    GFR: CrCl cannot be calculated (Unknown ideal weight.).  Liver Function Tests: Recent Labs  Lab 07/05/24 1653 07/06/24 0625 07/07/24 0317 07/09/24 0355 07/10/24 0344  AST 38 36 33 19 18  ALT 58* 60* 48* 31 25  ALKPHOS 78 77 62 62 69  BILITOT 0.5 0.4 0.5 0.6 0.7  PROT 7.1 6.9 6.4* 6.6 6.7  ALBUMIN 4.3 4.1 3.3* 3.4* 3.4*    CBG: Recent Labs  Lab 07/05/24 1705  GLUCAP 97     Recent Results (from the past 240 hours)  Wet  prep, genital     Status: None   Collection Time: 07/09/24  6:51 PM   Specimen: PATH Cytology Urine  Result Value Ref Range Status   Yeast Wet Prep HPF POC NONE SEEN NONE SEEN Final   Trich, Wet Prep NONE SEEN NONE SEEN Final   Clue Cells Wet Prep HPF POC NONE SEEN NONE SEEN Final   WBC, Wet Prep HPF POC <10 <10 Final   Sperm NONE SEEN  Final    Comment: Performed at [facility], [address] [place], [place] [address]         Radiology Studies: VAS US  TRANSCRANIAL DOPPLER W BUBBLES Result Date: 07/08/2024  Transcranial Doppler with Bubble Patient Name:  DALIANA LEVERETT  Date of Exam:   07/08/2024 Medical Rec #: [id number]       Accession #:    [id number] Date of Birth: December 08, 1995      Patient Gender: F Patient Age:   58 years Exam Location:  [facility] Procedure:      VAS US  TRANSCRANIAL DOPPLER W BUBBLES Referring Phys: BRIDGETTE CHRISTOPHER --------------------------------------------------------------------------------  Indications: Stroke. History: TEE: PFO seen by color Doppler (left to right shunt). Agitated saline at rest was late positive for shunt, but agitated saline with abdominal pressure (Valsalva) was rapidly positive, consistent with right to left intra-atrial shunt.  Comparison Study: No prior study on file Performing Technologist: Alberta Lis RVS  Examination Guidelines: Ohanna Gassert complete evaluation includes B-mode imaging, spectral Doppler, color Doppler, and power Doppler as needed of all accessible portions of each vessel. Bilateral testing is considered an integral part of Wanda Rideout complete examination. Limited examinations for reoccurring indications may be performed as noted.  Summary:  Mallory Schaad vascular evaluation was performed. The right middle cerebral artery was studied. An IV was inserted into the patient's left forearm. Verbal informed consent was obtained.  TCD bubble study indicative of PFO [name] Grade III lower end of scale at rest, and higher end of scale with  Valsalva maneuver.   Diagnosing physician: Ary Cummins MD Electronically signed by Ary Cummins MD on 07/08/2024 at 6:10:40 PM.    Final         Scheduled Meds:  acetaminophen   1,000 mg Oral Q8H   aspirin  EC  81 mg Oral Daily   clopidogrel   75 mg Oral Daily   ferrous sulfate   325 mg Oral QODAY   polyethylene glycol  17 g Oral BID   rosuvastatin   20 mg Oral Daily   thiamine   100 mg  Oral Daily   Continuous Infusions:  sodium chloride  75 mL/hr at 07/07/24 1353     LOS: 5 days    Time spent: over 30 min     Meliton Monte, MD [facility]   To contact the attending provider between 7A-7P or the covering provider during after hours 7P-7A, please log into the web site [email or web address] and access using universal Rosa Sanchez password for that web site. If you do not have the password, please call the hospital operator.  07/10/2024, 1:19 PM

## 2024-07-10 NOTE — Plan of Care (Signed)
  Problem: Education: Goal: Knowledge of disease or condition will improve Outcome: Progressing   Problem: Ischemic Stroke/TIA Tissue Perfusion: Goal: Complications of ischemic stroke/TIA will be minimized Outcome: Progressing   Problem: Coping: Goal: Will verbalize positive feelings about self Outcome: Progressing   Problem: Health Behavior/Discharge Planning: Goal: Ability to manage health-related needs will improve Outcome: Progressing   Problem: Self-Care: Goal: Ability to communicate needs accurately will improve Outcome: Progressing   Problem: Nutrition: Goal: Risk of aspiration will decrease Outcome: Progressing   Problem: Health Behavior/Discharge Planning: Goal: Ability to manage health-related needs will improve Outcome: Progressing   Problem: Clinical Measurements: Goal: Will remain free from infection Outcome: Progressing   Problem: Clinical Measurements: Goal: Cardiovascular complication will be avoided Outcome: Progressing   Problem: Activity: Goal: Risk for activity intolerance will decrease Outcome: Progressing   Problem: Elimination: Goal: Will not experience complications related to bowel motility Outcome: Progressing   Problem: Safety: Goal: Ability to remain free from injury will improve Outcome: Progressing   Problem: Skin Integrity: Goal: Risk for impaired skin integrity will decrease Outcome: Progressing

## 2024-07-10 NOTE — Progress Notes (Signed)
 [facility] night cross cover note:   I was notified by the patient's RN that the patient conveys difficulty with swallowing today, and his failed RN swallow screen this evening.  I have subsequently made the patient to strict n.p.o., and I have placed order for ST consult to occur in the morning for formal swallow evaluation.  The patient is due for a scheduled dose of MiraLAX  this evening.  In light of the above, we will hold the scheduled dose of MiraLAX .  Instead, I have placed order for one-time Dulcolax suppository.    Eva Pore, DO Hospitalist

## 2024-07-11 ENCOUNTER — Inpatient Hospital Stay (HOSPITAL_COMMUNITY): Payer: MEDICAID

## 2024-07-11 LAB — CBC WITH DIFFERENTIAL/PLATELET
Abs Immature Granulocytes: 0.02 K/uL (ref 0.00–0.07)
Basophils Absolute: 0.1 K/uL (ref 0.0–0.1)
Basophils Relative: 1 %
Eosinophils Absolute: 0.1 K/uL (ref 0.0–0.5)
Eosinophils Relative: 1 %
HCT: 41 % (ref 36.0–46.0)
Hemoglobin: 13.6 g/dL (ref 12.0–15.0)
Immature Granulocytes: 0 %
Lymphocytes Relative: 11 %
Lymphs Abs: 1 K/uL (ref 0.7–4.0)
MCH: 25.7 pg — ABNORMAL LOW (ref 26.0–34.0)
MCHC: 33.2 g/dL (ref 30.0–36.0)
MCV: 77.5 fL — ABNORMAL LOW (ref 80.0–100.0)
Monocytes Absolute: 0.7 K/uL (ref 0.1–1.0)
Monocytes Relative: 8 %
Neutro Abs: 7.1 K/uL (ref 1.7–7.7)
Neutrophils Relative %: 79 %
Platelets: 142 K/uL — ABNORMAL LOW (ref 150–400)
RBC: 5.29 MIL/uL — ABNORMAL HIGH (ref 3.87–5.11)
RDW: 14.7 % (ref 11.5–15.5)
WBC: 9 K/uL (ref 4.0–10.5)
nRBC: 0 % (ref 0.0–0.2)

## 2024-07-11 LAB — HEPARIN LEVEL (UNFRACTIONATED): Heparin Unfractionated: 0.2 [IU]/mL — ABNORMAL LOW (ref 0.30–0.70)

## 2024-07-11 LAB — GC/CHLAMYDIA PROBE AMP (~~LOC~~) NOT AT ARMC
Chlamydia: NEGATIVE
Comment: NEGATIVE
Comment: NORMAL
Neisseria Gonorrhea: NEGATIVE

## 2024-07-11 LAB — PHOSPHORUS: Phosphorus: 3.6 mg/dL (ref 2.5–4.6)

## 2024-07-11 LAB — COMPREHENSIVE METABOLIC PANEL WITH GFR
ALT: 30 U/L (ref 0–44)
AST: 24 U/L (ref 15–41)
Albumin: 3.5 g/dL (ref 3.5–5.0)
Alkaline Phosphatase: 66 U/L (ref 38–126)
Anion gap: 13 (ref 5–15)
BUN: 11 mg/dL (ref 6–20)
CO2: 21 mmol/L — ABNORMAL LOW (ref 22–32)
Calcium: 9.1 mg/dL (ref 8.9–10.3)
Chloride: 108 mmol/L (ref 98–111)
Creatinine, Ser: 0.9 mg/dL (ref 0.44–1.00)
GFR, Estimated: >60 mL/min (ref >60)
Glucose, Bld: 91 mg/dL (ref 70–99)
Potassium: 4.2 mmol/L (ref 3.5–5.1)
Sodium: 142 mmol/L (ref 135–145)
Total Bilirubin: 0.6 mg/dL (ref 0.0–1.2)
Total Protein: 7.1 g/dL (ref 6.5–8.1)

## 2024-07-11 LAB — MAGNESIUM: Magnesium: 2 mg/dL (ref 1.7–2.4)

## 2024-07-11 MED ORDER — SODIUM CHLORIDE 0.9 % IV BOLUS
1000.0000 mL | Freq: Once | INTRAVENOUS | Status: AC
Start: 2024-07-11 — End: 2024-07-11
  Administered 2024-07-11: 1000 mL via INTRAVENOUS

## 2024-07-11 MED ORDER — LACTATED RINGERS IV SOLN: INTRAVENOUS | Status: DC

## 2024-07-11 MED ORDER — DIAZEPAM 5 MG/ML IJ SOLN
2.5000 mg | Freq: Once | INTRAMUSCULAR | Status: AC | PRN
  Administered 2024-07-11: 2.5 mg via INTRAVENOUS
  Filled 2024-07-11: qty 2

## 2024-07-11 MED ORDER — HEPARIN (PORCINE) 25000 UT/250ML-% IV SOLN
900.0000 [IU]/h | INTRAVENOUS | Status: AC
Start: 2024-07-11 — End: 2024-07-13
  Administered 2024-07-11: 800 [IU]/h via INTRAVENOUS
  Administered 2024-07-12: 900 [IU]/h via INTRAVENOUS
  Filled 2024-07-11 (×2): qty 250

## 2024-07-11 MED ORDER — IOHEXOL 350 MG/ML SOLN
75.0000 mL | Freq: Once | INTRAVENOUS | Status: AC | PRN
  Administered 2024-07-11: 75 mL via INTRAVENOUS

## 2024-07-11 MED ORDER — SODIUM CHLORIDE 0.9 % IV SOLN: INTRAVENOUS | Status: DC

## 2024-07-11 NOTE — Progress Notes (Addendum)
 Inpatient Rehab Admissions Coordinator:   Stopped by to see pt at bedside.  Mom was visiting.  Updated on CIR, no beds today.  Mom inquiring about consult with psych service, which I deferred to MD.  I left Dalia a message to discuss caregiver support and discharge plan following potential CIR stay.  Will follow.   1242: Spoke to Zenobia on the phone about discharge plans.  She relays plan for discharge from CIR back to apartment with herself, pt's 2 aunts, and other friends to provide 24/7 supervision.  We discussed apartment access as they're currently on the second floor and trying to see about ground floor availability.  Amra is mobilizing fairly well, and it may be reasonable for her to navigate the stairs with help for home entry/exit for appointments at discharge based on Friday's therapy session.  Would be something to consider.  We reviewed Medicaid pending status and no beds on CIR today.  I will continue to keep them updated.   Reche Lowers, PT, DPT Admissions Coordinator [phone] 07/11/24  12:33 PM

## 2024-07-11 NOTE — Progress Notes (Signed)
 NEUROLOGY CONSULT FOLLOW UP NOTE   Date of service: July 11, 2024 Patient Name: Cassidy Bruce MRN:  [id number] DOB:  02/21/1996  Interval Hx/subjective   Notified of change in exam. Has difficulty swallowing that patient reporst started around 11pm yesterday but there is a note from Dr. Marcene from 1018pm last night mentioning new onset dysphagia. This AM, noted to be weak in LUE around 11am. RN reports that she was not weak in LUE earlier thou.  Vitals   Vitals:   07/10/24 2334 07/11/24 0345 07/11/24 0946 07/11/24 1216  BP: 120/80 117/75 108/71 120/89  Pulse: 67 62 67 81  Resp: 18 18 18 18   Temp: 99.1 F (37.3 C) 98.7 F (37.1 C) 98.4 F (36.9 C) 98.6 F (37 C)  TempSrc: Oral Oral Oral Oral  SpO2: 99% 98% 100% 100%     There is no height or weight on file to calculate BMI.  Physical Exam   General: Laying comfortably in bed; in no acute distress.  HENT: Normal oropharynx and mucosa. Normal external appearance of ears and nose.  Neck: Supple, no pain or tenderness  CV: No JVD. No peripheral edema.  Pulmonary: Symmetric Chest rise. Normal respiratory effort.  Abdomen: Soft to touch, non-tender.  Ext: No cyanosis, edema, or deformity  Skin: No rash. Normal palpation of skin.   Musculoskeletal: Normal digits and nails by inspection. No clubbing.   Neurologic Examination  Mental status/Cognition: Alert, oriented to self, place, month and year, good attention.  Speech/language: Scanning dysarthria with slightly impaired fluency.  comprehension intact, object naming intact, repetition intact.  Cranial nerves:   CN II Pupils equal and reactive to light, no VF deficits    CN III,IV,VI EOM intact, no gaze preference or deviation, no nystagmus    CN V normal sensation in V1, V2, and V3 segments bilaterally    CN VII no asymmetry, no nasolabial fold flattening    CN VIII normal hearing to speech    CN IX & X normal palatal elevation, no uvular deviation    CN XI 5/5 head  turn and 5/5 shoulder shrug bilaterally    CN XII midline tongue protrusion    Motor:  Muscle bulk: Normal, tone slightly decreased in left upper extremity Mvmt Root Nerve  Muscle Right Left Comments  SA C5/6 Ax Deltoid 5 3   EF C5/6 Mc Biceps 5 3   EE C6/7/8 Rad Triceps 5 3   WF C6/7 Med FCR     WE C7/8 PIN ECU     F Ab C8/T1 U ADM/FDI 5 2   HF L1/2/3 Fem Illopsoas 5 5   KE L2/3/4 Fem Quad     DF L4/5 D Peron Tib Ant 5 5   PF S1/2 Tibial Grc/Sol 5 5    Sensation:  Light touch Intact throughout   Pin prick    Temperature    Vibration   Proprioception    Coordination/Complex Motor:  - Finger to Nose intact bilaterally - Heel to shin with ataxia and left lower extremity worse than right lower extremity. - Rapid alternating movement are slowed throughout. - Gait: Deferred for patient safety.  Medications  Current Facility-Administered Medications:    acetaminophen  (TYLENOL ) tablet 1,000 mg, 1,000 mg, Oral, Q8H, Lonni Slain, MD, 1,000 mg at 07/10/24 1309   aspirin  EC tablet 81 mg, 81 mg, Oral, Daily, Lonni Slain, MD, 81 mg at 07/11/24 1141   butalbital -acetaminophen -caffeine  (FIORICET ) 50-325-40 MG per tablet 1 tablet, 1 tablet, Oral, Q8H PRN,  Lonni Slain, MD, 1 tablet at 07/10/24 9177   citalopram  (CELEXA ) tablet 20 mg, 20 mg, Oral, Daily, Perri DELENA Meliton Mickey., MD, 20 mg at 07/11/24 1140   clopidogrel  (PLAVIX ) tablet 75 mg, 75 mg, Oral, Daily, Reome, Earle J, RPH, 75 mg at 07/11/24 1141   ferrous sulfate  tablet 325 mg, 325 mg, Oral, QODAY, Perri DELENA Meliton Mickey., MD, 325 mg at 07/10/24 9185   lactated ringers  infusion, , Intravenous, Continuous, Perri DELENA Meliton Mickey., MD   LORazepam  (ATIVAN ) injection 1 mg, 1 mg, Intravenous, Once PRN, Lonni Slain, MD   ondansetron  (ZOFRAN ) injection 4 mg, 4 mg, Intravenous, Q6H PRN, Lonni Slain, MD, 4 mg at 07/10/24 1642   oxyCODONE  (Oxy IR/ROXICODONE ) immediate release tablet 5 mg, 5  mg, Oral, Q4H PRN, Lonni Slain, MD, 5 mg at 07/09/24 1658   polyethylene glycol (MIRALAX  / GLYCOLAX ) packet 17 g, 17 g, Oral, BID, Perri DELENA Meliton Mickey., MD, 17 g at 07/10/24 0815   rosuvastatin  (CRESTOR ) tablet 20 mg, 20 mg, Oral, Daily, Lonni Slain, MD, 20 mg at 07/11/24 1140   thiamine  (VITAMIN B1) tablet 100 mg, 100 mg, Oral, Daily, Pham, Minh Q, RPH-CPP, 100 mg at 07/11/24 1141  Labs and Diagnostic Imaging   CBC:  Recent Labs  Lab 07/10/24 0344 07/11/24 0435  WBC 7.3 9.0  NEUTROABS 4.3 7.1  HGB 13.0 13.6  HCT 39.3 41.0  MCV 77.4* 77.5*  PLT 135* 142*    Basic Metabolic Panel:  Lab Results  Component Value Date   NA 142 07/11/2024   K 4.2 07/11/2024   CO2 21 (L) 07/11/2024   GLUCOSE 91 07/11/2024   BUN 11 07/11/2024   CREATININE 0.90 07/11/2024   CALCIUM  9.1 07/11/2024   GFRNONAA >60 07/11/2024   GFRAA >60 08/24/2019   Lipid Panel:  Lab Results  Component Value Date   LDLCALC 104 (H) 07/06/2024   HgbA1c:  Lab Results  Component Value Date   HGBA1C 5.4 07/06/2024   Urine Drug Screen:     Component Value Date/Time   LABOPIA NEGATIVE 07/05/2024 2225   COCAINSCRNUR NEGATIVE 07/05/2024 2225   LABBENZ NEGATIVE 07/05/2024 2225   AMPHETMU NEGATIVE 07/05/2024 2225   THCU NEGATIVE 07/05/2024 2225   LABBARB NEGATIVE 07/05/2024 2225    Alcohol Level     Component Value Date/Time   ETH <15 07/05/2024 1653   INR  Lab Results  Component Value Date   INR 0.98 12/31/2017   APTT No results found for: APTT AED levels: No results found for: PHENYTOIN, ZONISAMIDE, LAMOTRIGINE, LEVETIRACETA  CT Head without contrast(Personally reviewed): Bilateral cerebellar infarcts and more prominent on the CT head today compared to the prior from 07/05/2024.  There is partial effacement of the fourth ventricle but no obstructive hydrocephalus noted.  CT angio Head and Neck with contrast(Personally reviewed): Redemonstrated left vertebral artery  dissection with occlusion of the proximal left V1 segment and reconstitution along the distal V2 segment. Increased irregularity of the left V3 segment with decreased vessel caliber, and slightly decreased caliber of the left V4 segment.  MRI Brain: Pending  Assessment   Karmon Andis is a 28 y.o. female admitted with left vertebral artery dissection with bilateral SCA distribution cerebral infarct.  She was noted to have developed new onset dysphagia yesterday and this was followed by left upper extremity weakness that was noted today.  This is despite her getting dual antiplatelet treatment with aspirin  and Plavix .  Neurology was reconsulted for evaluation and workup.  At the bedside,  patient has scanning speech but is very typical of a cerebellar infarct, she also has a difficult time with cough.  She seems to be able to clear her throat though.  She has weakness in her left upper extremity.  This was not noted on prior neurology evaluation on 07/08/2024.  Given progression of her symptoms attributed to vertebral artery dissection and despite being on dual antiplatelet treatment, I discussed with patient, her mother at the bedside as well as with patient's aunt.  Will add low-dose neuro scale heparin  to her regimen and do every 2 hours neuro checks with NIH stroke scale, will get repeat MRI of the brain and put her on fluids at 100 mL/h.  Recommendations  -Heparin  GTT neuro scale for about 48 hours. -Continue aspirin  and Plavix . - Every 2 hours neurochecks and NIH stroke scales. -MRI of the brain without contrast. -Fluids at 100 mL/h. Please let neurology know-if there is any significant decline or changes in the NIH stroke scale.  She will need a stat CT head without contrast to rule out hemorrhage. ______________________________________________________________________  Plan was discussed in detail with patient, her mother and aunt at the bedside.  Plan also discussed with Dr. Perri  over secure chat and then with the RN in person.  I discussed this case with Dr. Saidi with the stroke team to get his input on management too.  Signed, Winfield Caba, MD [facility]

## 2024-07-11 NOTE — Progress Notes (Signed)
 Physical Therapy Treatment Patient Details Name: Cassidy Bruce MRN: [id number] DOB: 1996/05/06 Today's Date: 07/11/2024   History of Present Illness Pt is a 28 yo female presenting to [facility] on 07/05/24 for dizziness, slurred speech, and impaired balance. MRI showed bilat cerebellar infarct with L>R. CTA head/neck showed proximal L vertebral artery dissection. PMH of ADHD, MDD.    PT Comments  Pt progressing steadily towards her physical therapy goals and is motivated to participate. Pt requiring min-mod assist (+2 safety) for functional mobility. Focus on gait training, LE strengthening and balance today. Pt with good activity tolerance throughout. Continues to demonstrate left sided weakness, decreased coordination, ataxic gait, impaired standing balance, and cognition. Patient will benefit from intensive inpatient follow-up therapy, >3 hours/day.    If plan is discharge home, recommend the following: Two people to help with walking and/or transfers;A lot of help with bathing/dressing/bathroom;Assistance with feeding;Assistance with cooking/housework;Assist for transportation;Help with stairs or ramp for entrance   Can travel by private vehicle        Equipment Recommendations  Other (comment) (TBD)    Recommendations for Other Services Rehab consult     Precautions / Restrictions Precautions Precautions: Fall Recall of Precautions/Restrictions: Impaired Restrictions Weight Bearing Restrictions Per Provider Order: No     Mobility  Bed Mobility Overal bed mobility: Needs Assistance Bed Mobility: Supine to Sit   Sidelying to sit: Supervision            Transfers Overall transfer level: Needs assistance Equipment used: Rolling walker (2 wheels) (R railing) Transfers: Sit to/from Stand Sit to Stand: Min assist           General transfer comment: Cues to shift hips forward prior to standing, light minA to boost up and to steady    Ambulation/Gait Ambulation/Gait  assistance: Mod assist, +2 safety/equipment Gait Distance (Feet): 50 Feet (50, 50) Assistive device: Rolling walker (2 wheels) (R railing) Gait Pattern/deviations: Scissoring, Ataxic, Step-to pattern, Narrow base of support Gait velocity: decreased Gait velocity interpretation: <1.8 ft/sec, indicate of risk for recurrent falls   General Gait Details: Verbal cues for neutral L foot positioning, smaller steps for increased control and obstacle negotiation. pt requiring modA for balance and intermittent walker steering assist.   Stairs             Wheelchair Mobility     Tilt Bed    Modified Rankin (Stroke Patients Only) Modified Rankin (Stroke Patients Only) Pre-Morbid Rankin Score: No symptoms Modified Rankin: Moderately severe disability     Balance Overall balance assessment: Needs assistance Sitting-balance support: Feet supported Sitting balance-Leahy Scale: Fair     Standing balance support: Bilateral upper extremity supported, During functional activity, Reliant on assistive device for balance Standing balance-Leahy Scale: Poor Standing balance comment: Reliant on UE support and mod A                            Communication Communication Communication: Impaired Factors Affecting Communication: Difficulty expressing self;Reduced clarity of speech  Cognition Arousal: Alert Behavior During Therapy: WFL for tasks assessed/performed   PT - Cognitive impairments: Attention, Sequencing, Problem solving, Safety/Judgement                       PT - Cognition Comments: Decreased awareness of deficits. Impaired safety awareness with difficulty sequencing/problem solving Following commands: Impaired Following commands impaired: Follows multi-step commands inconsistently, Only follows one step commands consistently    Cueing Cueing Techniques: Verbal  cues, Gestural cues, Tactile cues  Exercises General Exercises - Lower Extremity Long Arc Quad:  AROM, Both, Seated, 15 reps Other Exercises Other Exercises: Bilateral hand support on railing: lateral stepping to R/L x 10 ft in either direction    General Comments        Pertinent Vitals/Pain Pain Assessment Pain Assessment: Faces Faces Pain Scale: No hurt    Home Living                          Prior Function            PT Goals (current goals can now be found in the care plan section) Acute Rehab PT Goals Patient Stated Goal: be independent PT Goal Formulation: With patient Time For Goal Achievement: 07/21/24 Potential to Achieve Goals: Good Progress towards PT goals: Progressing toward goals    Frequency    Min 3X/week      PT Plan      Co-evaluation              AM-PAC PT 6 Clicks Mobility   Outcome Measure  Help needed turning from your back to your side while in a flat bed without using bedrails?: A Little Help needed moving from lying on your back to sitting on the side of a flat bed without using bedrails?: A Little Help needed moving to and from a bed to a chair (including a wheelchair)?: A Little Help needed standing up from a chair using your arms (e.g., wheelchair or bedside chair)?: A Little Help needed to walk in hospital room?: A Lot Help needed climbing 3-5 steps with a railing? : A Lot 6 Click Score: 16    End of Session Equipment Utilized During Treatment: Gait belt Activity Tolerance: Patient tolerated treatment well Patient left: with call bell/phone within reach;with family/visitor present;in chair Nurse Communication: Mobility status PT Visit Diagnosis: Unsteadiness on feet (R26.81);Ataxic gait (R26.0);Difficulty in walking, not elsewhere classified (R26.2)     Time: 8978-8950 PT Time Calculation (min) (ACUTE ONLY): 28 min  Charges:    $Gait Training: 8-22 mins $Therapeutic Activity: 8-22 mins PT General Charges $$ ACUTE PT VISIT: 1 Visit                     Aleck Daring, PT, DPT Acute  Rehabilitation Services Office [phone]    Aleck ONEIDA Daring 07/11/2024, 1:24 PM

## 2024-07-11 NOTE — Evaluation (Signed)
 Clinical/Bedside Swallow Evaluation Patient Details  Name: Cassidy Bruce MRN: [id number] Date of Birth: Jan 02, 1996  Today's Date: 07/11/2024 Time: SLP Start Time (ACUTE ONLY): 0908 SLP Stop Time (ACUTE ONLY): 0917 SLP Time Calculation (min) (ACUTE ONLY): 9 min  Past Medical History:  Past Medical History:  Diagnosis Date   ADHD    Depression    Past Surgical History:  Past Surgical History:  Procedure Laterality Date   TRANSESOPHAGEAL ECHOCARDIOGRAM (CATH LAB) N/A 07/07/2024   Procedure: TRANSESOPHAGEAL ECHOCARDIOGRAM;  Surgeon: Lonni Slain, MD;  Location: [facility] INVASIVE CV LAB;  Service: Cardiovascular;  Laterality: N/A;   HPI:  Pt is a 28 yo female presenting to [facility] on 07/05/24 for dizziness, slurred speech, and impaired balance. MRI showed bilat cerebellar infarct with L>R. CTA head/neck showed proximal L vertebral artery dissection. Pt initially passed a swallow screen but was observed to have difficulty swallowing 9/7 and repeat swallow screen was not passed. PMH of ADHD, MDD.    Assessment / Plan / Recommendation  Clinical Impression  Pt has a functional oral motor exam and oral preparation with purees and regular solids. Pt and mother both report anterior loss of boluses on previous date. This is not observed during this eval until mentioned again by SLP, at which point pt then orally held the water  in her mouth before losing it anteriorly out the front and both sides of her mouth (not due to overt focal impairment). Pt does have intermittent coughing and throat clearing with thin liquids as well, so could benefit from MBS to better evaluate oropharyngeal swallow before resuming a PO diet. This is planned with radiology for later today. Pending completion, could offer meds in puree and ice chips while also keeping up with oral care.   SLP Visit Diagnosis: Dysphagia, unspecified (R13.10)    Aspiration Risk       Diet Recommendation NPO except meds;Ice chips PRN after oral  care    Medication Administration: Whole meds with puree    Other  Recommendations Oral Care Recommendations: Oral care BID;Oral care QID     Assistance Recommended at Discharge    Functional Status Assessment Patient has had a recent decline in their functional status and demonstrates the ability to make significant improvements in function in a reasonable and predictable amount of time.  Frequency and Duration            Prognosis Prognosis for improved oropharyngeal function: Good      Swallow Study   General HPI: Pt is a 28 yo female presenting to [facility] on 07/05/24 for dizziness, slurred speech, and impaired balance. MRI showed bilat cerebellar infarct with L>R. CTA head/neck showed proximal L vertebral artery dissection. Pt initially passed a swallow screen but was observed to have difficulty swallowing 9/7 and repeat swallow screen was not passed. PMH of ADHD, MDD. Type of Study: Bedside Swallow Evaluation Previous Swallow Assessment: none in chart Diet Prior to this Study: NPO Temperature Spikes Noted: No Respiratory Status: Room air History of Recent Intubation: No Behavior/Cognition: Alert;Cooperative;Requires cueing Oral Cavity Assessment: Within Functional Limits Oral Care Completed by SLP: No Oral Cavity - Dentition: Adequate natural dentition Vision: Functional for self-feeding Self-Feeding Abilities: Able to feed self;Needs assist Patient Positioning: Upright in bed Baseline Vocal Quality: Other (comment) (monotone) Volitional Cough: Strong Volitional Swallow: Able to elicit    Oral/Motor/Sensory Function Overall Oral Motor/Sensory Function: Within functional limits   Ice Chips Ice chips: Not tested   Thin Liquid Thin Liquid: Impaired Presentation: Cup;Self Fed;Straw Oral Phase  Functional Implications: Other (comment) (anterior spillage) Pharyngeal  Phase Impairments: Throat Clearing - Immediate;Cough - Immediate    Nectar Thick Nectar Thick Liquid: Not tested    Honey Thick Honey Thick Liquid: Not tested   Puree Puree: Within functional limits Presentation: Spoon;Self Fed   Solid     Solid: Within functional limits Presentation: Self Fed      Leita SAILOR., M.A. CCC-SLP Acute Rehabilitation Services Office: [phone]  Secure chat preferred  07/11/2024,9:47 AM

## 2024-07-11 NOTE — TOC Progression Note (Signed)
 Transition of Care [facility]) - Progression Note    Patient Details  Name: Cassidy Bruce MRN: [id number] Date of Birth: 1996/01/14  Transition of [facility]) CM/SW Contact  Andrez JULIANNA George, RN Phone Number: 07/11/2024, 2:42 PM  Clinical Narrative:     Plan is for CIR when insurance authorization is complete and CIR has a bed.  IP Care management following.  Expected Discharge Plan: IP Rehab Facility Barriers to Discharge: Continued Medical Work up, Inadequate or no insurance               Expected Discharge Plan and Services     Post Acute Care Choice: IP Rehab Living arrangements for the past 2 months: Apartment                                       Social Drivers of Health (SDOH) Interventions SDOH Screenings   Food Insecurity: No Food Insecurity (07/06/2024)  Housing: Low Risk  (07/06/2024)  Transportation Needs: No Transportation Needs (07/06/2024)  Utilities: Not At Risk (07/06/2024)  Alcohol Screen: Low Risk  (07/25/2018)  Financial Resource Strain: Low Risk  (05/09/2024)   [facility] from [facility]  Social Connections: Unknown (07/09/2023)   Received from [facility]  Tobacco Use: Low Risk  (07/07/2024)    Readmission Risk Interventions     No data to display

## 2024-07-11 NOTE — Progress Notes (Signed)
 Speech Language Pathology Treatment: Cognitive-Linguistic  Patient Details Name: Shariyah Eland MRN: [id number] DOB: 07-11-1996 Today's Date: 07/11/2024 Time: 9082-9072 SLP Time Calculation (min) (ACUTE ONLY): 10 min  Assessment / Plan / Recommendation Clinical Impression  Pt's articulation seems to be clearer and she agrees that her speech is improved, demonstrating ongoing intellectual awareness about dysarthria. Her rate and prosody are still altered, with phonation monotonous. Min tactile/visual cues were given for problem solving during self-feeding. When exhibiting anterior loss of boluses, pt made no attempts to self-correct, needing assistance from SLP for online awareness and problem solving. She will continue to benefit from SLP f/u for cognition and communication.    HPI HPI: Pt is a 28 yo female presenting to [facility] on 07/05/24 for dizziness, slurred speech, and impaired balance. MRI showed bilat cerebellar infarct with L>R. CTA head/neck showed proximal L vertebral artery dissection. Pt initially passed a swallow screen but was observed to have difficulty swallowing 9/7 and repeat swallow screen was not passed. PMH of ADHD, MDD.      SLP Plan  Continue with current plan of care          Recommendations  Medication Administration: Whole meds with puree                  Oral care BID;Oral care QID   Frequent or constant Supervision/Assistance Dysarthria and anarthria (R47.1);Cognitive communication deficit (R41.841)     Continue with current plan of care     Leita SAILOR., M.A. CCC-SLP Acute Rehabilitation Services Office: [phone]  Secure chat preferred   07/11/2024, 9:52 AM

## 2024-07-11 NOTE — Progress Notes (Signed)
 SLP Cancellation Note  Patient Details Name: Cassidy Bruce MRN: [id number] DOB: 15-Aug-1996   Cancelled treatment:       Reason Eval/Treat Not Completed: Patient at procedure or test/unavailable. MBS was scheduled for this afternoon with radiology. At the time of her testing, she was already off the floor for a different test, and per conversation with radiology, MBS will not be able to be rescheduled for today. Will f/u to complete as soon as able.    Leita SAILOR., M.A. CCC-SLP Acute Rehabilitation Services Office: [phone]  Secure chat preferred  07/11/2024, 1:33 PM

## 2024-07-11 NOTE — Progress Notes (Signed)
 PHARMACY - ANTICOAGULATION CONSULT NOTE  Pharmacy Consult for Heparin  Indication: stroke  No Known Allergies  Patient Measurements: Height: 5' 4 (162.6 cm) Weight: 65.8 kg (145 lb) IBW/kg (Calculated) : 54.7 HEPARIN  DW (KG): 65.8  Vital Signs: Temp: 98.6 F (37 C) (09/08 1216) Temp Source: Oral (09/08 1216) BP: 120/89 (09/08 1216) Pulse Rate: 81 (09/08 1216)  Labs: Recent Labs    07/09/24 0355 07/10/24 0344 07/11/24 0435  HGB 12.9 13.0 13.6  HCT 39.0 39.3 41.0  PLT 125* 135* 142*  CREATININE 1.02* 0.96 0.90    Estimated Creatinine Clearance: 87.6 mL/min (by C-G formula based on SCr of 0.9 mg/dL).   Medical History: Past Medical History:  Diagnosis Date   ADHD    Depression    Assessment: 28 year old female to begin heparin  for stroke  Goal of Therapy:  Heparin  level 0.3-0.5 units/ml Monitor platelets by anticoagulation protocol: Yes   Plan:  Heparin  to begin at 800 units / hr Heparin  level in 8 hours Daily heparin  level, CBC  Thank you. Olam Monte, PharmD  07/11/2024,2:05 PM

## 2024-07-11 NOTE — Progress Notes (Signed)
 Pt taken to CT as instructed by Dr. Roxine. Pt states she has had difficulty swallowing since last night at 2300. After scan, pt ok to return to 3 W level of care per Dr. Roxine.  Richardson Said RN [phone]

## 2024-07-11 NOTE — Progress Notes (Signed)
 Occupational Therapy Treatment Patient Details Name: Cassidy Bruce MRN: [id number] DOB: [birth year] Today's Date: 07/11/2024   History of present illness Pt is a 28 yo female presenting to [facility] on 07/05/24 for dizziness, slurred speech, and impaired balance. MRI showed bilat cerebellar infarct with L>R. CTA head/neck showed proximal L vertebral artery dissection. PMH of ADHD, MDD.   OT comments  Pt making steady progress toward goals, requiring mod A with mobility and ADL tasks due to below listed deficits.  Continue to recommend intensive post acute rehab > 3 hours/day. Excellent family support. Acute OT to follow.       If plan is discharge home, recommend the following:  A lot of help with walking and/or transfers;A lot of help with bathing/dressing/bathroom;Assistance with cooking/housework;Help with stairs or ramp for entrance;Assist for transportation   Equipment Recommendations  BSC/3in1;Other (comment);Tub/shower bench    Recommendations for Other Services Rehab consult    Precautions / Restrictions Precautions Precautions: Fall Recall of Precautions/Restrictions: Impaired Restrictions Weight Bearing Restrictions Per Provider Order: No       Mobility Bed Mobility Overal bed mobility: Needs Assistance Bed Mobility: Supine to Sit   Sidelying to sit: Contact guard assist            Transfers Overall transfer level: Needs assistance Equipment used: Rolling walker (2 wheels) (R railing) Transfers: Sit to/from Stand Sit to Stand: Min assist           General transfer comment: Cues to shift hips forward prior to standing, light minA to boost up and to steady     Balance Overall balance assessment: Needs assistance Sitting-balance support: Feet supported Sitting balance-Leahy Scale: Fair     Standing balance support: Bilateral upper extremity supported, During functional activity, Reliant on assistive device for balance Standing balance-Leahy Scale:  Poor Standing balance comment: Reliant on UE support and mod A                           ADL either performed or assessed with clinical judgement   ADL   Eating/Feeding: Minimal assistance (NPO however ST gave OK for pt to eat applesauce)   Grooming: Minimal assistance       Lower Body Bathing: Moderate assistance       Lower Body Dressing: Moderate assistance               Functional mobility during ADLs: Moderate assistance      Extremity/Trunk Assessment Upper Extremity Assessment Upper Extremity Assessment: LUE deficits/detail RUE Deficits / Details: ataxic however imporved motor control to feed self apple sauce and donn/doff socks with assist RUE Coordination: decreased fine motor LUE Deficits / Details: stronger and increased ROM proximally - able to comlet shoulder flexion/elbow flex/ext WFL with gravity eliminated; gross grasp but unable to make full fist; did not appear to demonstrate in-hand manipulation skills (unable to grasp sock) decreasing functional use of hand LUE Coordination: decreased fine motor;decreased gross motor   Lower Extremity Assessment Lower Extremity Assessment: Defer to PT evaluation        Vision       Perception     Praxis     Communication Communication Communication: Impaired Factors Affecting Communication: Difficulty expressing self;Reduced clarity of speech   Cognition Arousal: Alert Behavior During Therapy: [facility] for tasks assessed/performed Cognition:  (will further assess; upset about not being able to eat graham crackers)  Following commands: Impaired Following commands impaired: Follows multi-step commands inconsistently, Only follows one step commands consistently      Cueing   Cueing Techniques: Verbal cues, Gestural cues, Tactile cues  Exercises Exercises: Other exercises, General Lower Extremity General Exercises - Lower Extremity Long Arc Quad: AROM,  Both, Seated, 15 reps Other Exercises Other Exercises: PNF D1/D2 patterns in supine Other Exercises: L hand A/AA/ROM - attmepting grasp/release tasks Other Exercises: rythmic stabilization trunk exercise EOB    Shoulder Instructions       General Comments      Pertinent Vitals/ Pain       Pain Assessment Pain Assessment: Faces Faces Pain Scale: No hurt  Home Living                                          Prior Functioning/Environment              Frequency  Min 3X/week        Progress Toward Goals  OT Goals(current goals can now be found in the care plan section)  Progress towards OT goals: Progressing toward goals  Acute Rehab OT Goals Patient Stated Goal: to be able to eat something OT Goal Formulation: With patient Time For Goal Achievement: 07/21/24 Potential to Achieve Goals: Good ADL Goals Pt Will Perform Grooming: with modified independence;standing Pt Will Perform Lower Body Bathing: with modified independence;sit to/from stand Pt Will Perform Upper Body Dressing: with modified independence;sitting Pt Will Perform Lower Body Dressing: sit to/from stand Pt Will Transfer to Toilet: with modified independence;ambulating Additional ADL Goal #1: Pt will complete coorindation HEP for BUEs/BLEs independently  Plan      Co-evaluation                 AM-PAC OT 6 Clicks Daily Activity     Outcome Measure   Help from another person eating meals?: A Lot Help from another person taking care of personal grooming?: A Little Help from another person toileting, which includes using toliet, bedpan, or urinal?: A Lot Help from another person bathing (including washing, rinsing, drying)?: A Lot Help from another person to put on and taking off regular upper body clothing?: A Lot Help from another person to put on and taking off regular lower body clothing?: A Lot 6 Click Score: 13    End of Session Equipment Utilized During  Treatment: Gait belt  OT Visit Diagnosis: Unsteadiness on feet (R26.81);Other abnormalities of gait and mobility (R26.89);Other symptoms and signs involving the nervous system (R29.898);Ataxia, unspecified (R27.0);Pain   Activity Tolerance Patient tolerated treatment well   Patient Left in bed;with call bell/phone within reach;with bed alarm set;Other (comment) (left in chair position)   Nurse Communication Mobility status        Time: 8391-8363 OT Time Calculation (min): 28 min  Charges: OT General Charges $OT Visit: 1 Visit OT Treatments $Self Care/Home Management : 8-22 mins $Neuromuscular Re-education: 8-22 mins  Kreg Sink, OT/L   Acute OT Clinical Specialist Acute Rehabilitation Services Pager [phone] Office [phone]   [facility] 07/11/2024, 6:52 PM

## 2024-07-11 NOTE — Plan of Care (Signed)
  Problem: Ischemic Stroke/TIA Tissue Perfusion: Goal: Complications of ischemic stroke/TIA will be minimized Outcome: Progressing   Problem: Coping: Goal: Will verbalize positive feelings about self Outcome: Progressing   Problem: Health Behavior/Discharge Planning: Goal: Ability to manage health-related needs will improve Outcome: Progressing   Problem: Self-Care: Goal: Ability to communicate needs accurately will improve Outcome: Progressing   Problem: Nutrition: Goal: Risk of aspiration will decrease Outcome: Progressing   Problem: Clinical Measurements: Goal: Will remain free from infection Outcome: Progressing   Problem: Clinical Measurements: Goal: Respiratory complications will improve Outcome: Progressing   Problem: Clinical Measurements: Goal: Cardiovascular complication will be avoided Outcome: Progressing   Problem: Activity: Goal: Risk for activity intolerance will decrease Outcome: Progressing   Problem: Nutrition: Goal: Adequate nutrition will be maintained Outcome: Progressing   Problem: Safety: Goal: Ability to remain free from injury will improve Outcome: Progressing   Problem: Skin Integrity: Goal: Risk for impaired skin integrity will decrease Outcome: Progressing

## 2024-07-11 NOTE — Progress Notes (Signed)
 PROGRESS NOTE    Cassidy Bruce  FMW:[id number] DOB: 26-Feb-1996 DOA: 07/05/2024 PCP: Pcp, No  Chief Complaint  Patient presents with   Seizures    Brief Narrative:   Cassidy Bruce is Cassidy Bruce 28 year old with Cassidy Bruce past medical history significant for ADHD and depression who presented to the hospital with seizure-like activity and has been having intermittent headaches. Yesterday around 3:30 PM she became unresponsive was noted to be shaking all over and would not follow commands or answer questions. She was brought to the ED for further evaluation and acetaminophen  and salicylate levels were negative and alcohol was negative. She appeared ataxic and CTA of the head neck was done and showed Cassidy Bruce proximal left vertebral artery dissection. Neuro was consulted and recommended stroke workup and transferring the the patient to [facility] for further evaluation. He continues to complain of severe headache and continues to have incoordination, imbalance and slurred speech.   Assessment & Plan:   Principal Problem:   Stroke [facility]) Active Problems:   Hypokalemia   PFO (patent foramen ovale)   Vertebral artery dissection (HCC)  Acute Stroke She complained of dysphagia overnight - on my exam today, also has new left sided weakness that started around the same time.  Not quite clear exactly what time things changed last night.  Appreciate Dr. Vanessa who came to evaluate her urgently. Repeat 9/8 CTA head/neck with evovlving bilateral cerebellar infarcts, particularly within the superior cerebellar artery territories with associated mass effect and partial effacement of the 4th ventricle.  L vertebral artery dissection (see report). Repeat MRI pending  Now on heparin  gtt in addition to DAPT per neurology MRI with acute infarcts within the cerebellar vermis and bilateral cerebellar hemispheres.  Large acute infarcts present within the superior cerebellar artery territories bilaterally.  Posterior fossa mass effect  without cerebellar tonsillar herniation or evidence of obstructive hydrocephalus.  Petechial hemorrhage within the cerebellar vermis and suerior R cerebellar hemisphere. CTA head/neck with proximal L vertebral artery dissection, 13 ml region of ischemia affecting both cerebellar hemispheres, worse on L TEE with PFO  Transcranial doppler with PFO Cassidy Bruce grade II lower end of scale at rest and higher end of scale with valsava LE US  negative for DVT Echo with EF 60-65%, no RWMA LDL 104, A1c 5.4 ANA negative, cardiolipin ab negative, serum homecysteine wnl, beta 2 glycoprotein ab wnl, lupus anticoagulant not detected.  vitamin b1 wnl.  Appreciate neurology assistance - bilateral cerebellar infarct, embolic vs L VA occlusion/dissection - recommending DAPT x3 months, then ASA alone.  Crestor  20 mg daily (stop if pregnant or planning pregnancy).  To be referred to cards for consideration of PFO closure.  NSVT Normal EF Noted, follow on tele  Replace lytes as needed  Dyslipidemia Statin   Acute Kidney Injury Very mild, trend  Microcytosis Low normal ferritin, start every other day iron   Thrombocytopenia  improving  Mood Disorder celexa   Complex Family Dynamics At this point in time, she's deferring to her friend [name]  Birth Control  Nexplanon  in place (L arm) - appears it was placed 08/2020 Nexplanon  d/c'd 9/6 Will need to follow outpatient with gyn for future contraceptive counseling  Leukocytosis Resolved  Elevated LFT's resolved  Homelessness TOC, appreciate assistance    DVT prophylaxis: SCD Code Status: full Family Communication: mother, aunt, friend Disposition:   Status is: Inpatient Remains inpatient appropriate because: need for continued inpatient care   Consultants:  Neurology cards  Procedures:  LE US  Summary:  RIGHT:      -  There is no evidence of deep vein thrombosis in the lower extremity.    - No cystic structure found in the  popliteal fossa.    LEFT:      - There is no evidence of deep vein thrombosis in the lower extremity.    - No cystic structure found in the popliteal fossa.   TEE IMPRESSIONS     1. Left ventricular ejection fraction, by estimation, is 60 to 65%. The  left ventricle has normal function.   2. Right ventricular systolic function is normal. The right ventricular  size is normal.   3. No left atrial/left atrial appendage thrombus was detected. The LAA  emptying velocity was 64 cm/s.   4. Distal anterior mitral leaflet tip appears to have minimal prolapse of  fibrinous appearing structures. The mitral valve is abnormal. Trivial  mitral valve regurgitation. No evidence of mitral stenosis.   5. The aortic valve is tricuspid. Aortic valve regurgitation is not  visualized. No aortic stenosis is present.   6. Evidence of atrial level shunting detected by color flow Doppler.  Agitated saline contrast bubble study was positive with shunting observed  within 3-6 cardiac cycles suggestive of interatrial shunt. There is Cassidy Bruce  small patent foramen ovale with  bidirectional shunting across atrial septum.   7. 3D performed of the mitral valve and 3D performed of the atrial septum  and demonstrates Distal anterior mitral leaflet tip appears to have  minimal prolapse of fibrinous appearing structures. PFO visualized.   Conclusion(s)/Recommendation(s): PFO seen by color Doppler (left to right  shunt). Agitated saline at rest was late positive for shunt, but agitated  saline with abdominal pressure (Valsalva) was rapidly positive, consistent  with right to left intra-atrial   shunt.   Echo IMPRESSIONS     1. Left ventricular ejection fraction, by estimation, is 60 to 65%. The  left ventricle has normal function. The left ventricle has no regional  wall motion abnormalities. Left ventricular diastolic parameters were  normal.   2. Right ventricular systolic function is normal. The right  ventricular  size is normal. There is normal pulmonary artery systolic pressure.   3. The mitral valve is normal in structure. Mild mitral valve  regurgitation. No evidence of mitral stenosis.   4. The aortic valve is tricuspid. Aortic valve regurgitation is not  visualized. visually opens well but gradient not assessed.   5. The inferior vena cava is normal in size with greater than 50%  respiratory variability, suggesting right atrial pressure of 3 mmHg.   Antimicrobials:  Anti-infectives (From admission, onward)    None       Subjective: C/o dysphagia overnight  Objective: Vitals:   07/11/24 0946 07/11/24 1216 07/11/24 1400 07/11/24 1500  BP: 108/71 120/89  121/79  Pulse: 67 81  82  Resp: 18 18  16   Temp: 98.4 F (36.9 C) 98.6 F (37 C)  98.5 F (36.9 C)  TempSrc: Oral Oral  Oral  SpO2: 100% 100%  100%  Weight:   65.8 kg   Height:   5' 4 (1.626 m)    No intake or output data in the 24 hours ending 07/11/24 1505  Filed Weights   07/11/24 1400  Weight: 65.8 kg    Examination:  General: No acute distress. Cardiovascular: RRR Lungs: unlabored Neurological: dysarthria, worsening LUE sided weakness today, continued ataxia Extremities: No clubbing or cyanosis. No edema.  Data Reviewed: I have personally reviewed following labs and imaging studies  CBC: Recent Labs  Lab 07/05/24 1653 07/06/24 0625 07/07/24 0317 07/09/24 0355 07/10/24 0344 07/11/24 0435  WBC 7.6 12.0* 10.8* 8.1 7.3 9.0  NEUTROABS 5.2  --  7.8* 5.0 4.3 7.1  HGB 13.3 12.8 13.1 12.9 13.0 13.6  HCT 40.1 39.4 40.1 39.0 39.3 41.0  MCV 77.7* 77.9* 78.9* 77.7* 77.4* 77.5*  PLT 138* 125* 156 125* 135* 142*    Basic Metabolic Panel: Recent Labs  Lab 07/05/24 1653 07/06/24 0625 07/07/24 0317 07/09/24 0355 07/10/24 0344 07/11/24 0435  NA 142 139 143 143 144 142  K 3.2* 4.6 4.0 3.5 3.6 4.2  CL 107 106 110 111 108 108  CO2 21* 21* 24 22 23  21*  GLUCOSE 123* 90 105* 80 85 91  BUN 9 8 8  10 11 11   CREATININE 0.79 0.70 1.06* 1.02* 0.96 0.90  CALCIUM  9.0 9.2 8.5* 8.9 9.0 9.1  MG 2.1  --  1.9 1.8 2.0 2.0  PHOS 2.2*  --  3.0 3.5 4.0 3.6    GFR: Estimated Creatinine Clearance: 87.6 mL/min (by C-G formula based on SCr of 0.9 mg/dL).  Liver Function Tests: Recent Labs  Lab 07/06/24 0625 07/07/24 0317 07/09/24 0355 07/10/24 0344 07/11/24 0435  AST 36 33 19 18 24   ALT 60* 48* 31 25 30   ALKPHOS 77 62 62 69 66  BILITOT 0.4 0.5 0.6 0.7 0.6  PROT 6.9 6.4* 6.6 6.7 7.1  ALBUMIN 4.1 3.3* 3.4* 3.4* 3.5    CBG: Recent Labs  Lab 07/05/24 1705  GLUCAP 97     Recent Results (from the past 240 hours)  Wet prep, genital     Status: None   Collection Time: 07/09/24  6:51 PM   Specimen: PATH Cytology Urine  Result Value Ref Range Status   Yeast Wet Prep HPF POC NONE SEEN NONE SEEN Final   Trich, Wet Prep NONE SEEN NONE SEEN Final   Clue Cells Wet Prep HPF POC NONE SEEN NONE SEEN Final   WBC, Wet Prep HPF POC <10 <10 Final   Sperm NONE SEEN  Final    Comment: Performed at [facility], [address] [place], [place] [address]         Radiology Studies: CT ANGIO HEAD NECK W WO CM Result Date: 07/11/2024 EXAM: CTA HEAD AND NECK WITH AND WITHOUT 07/11/2024 12:58:50 PM TECHNIQUE: CTA of the head and neck was performed with and without the administration of intravenous contrast. Multiplanar 2D and/or 3D reformatted images are provided for review. Automated exposure control, iterative reconstruction, and/or weight based adjustment of the mA/kV was utilized to reduce the radiation dose to as low as reasonably achievable. Stenosis of the internal carotid arteries measured using NASCET criteria. COMPARISON: MRI head 07/06/24 and CTA head and neck and CT head 07/05/24 CLINICAL HISTORY: Neuro deficit, acute, stroke suspected. FINDINGS: CTA NECK: AORTIC ARCH AND ARCH VESSELS: No dissection or arterial injury. No significant stenosis of the brachiocephalic or subclavian arteries.  Common aeration of the brachiocephalic and left common carotid arteries. CERVICAL CAROTID ARTERIES: No dissection, arterial injury, or hemodynamically significant stenosis by NASCET criteria. CERVICAL VERTEBRAL ARTERIES: The right vertebral artery is patent from the origin to the vertebrobasilar confluence. Redemonstrated left vertebral artery dissection. There is occlusion of the proximal left V1 segment. Reconstitution along the distal V2 segment is similar to prior. There is increased irregularity of the left V3 segment with decreased vessel caliber. The left V4 segment also appears slightly decreased in caliber. LUNGS AND MEDIASTINUM: Unremarkable. SOFT TISSUES:  No acute abnormality. BONES: No acute abnormality. CTA HEAD: ANTERIOR CIRCULATION: No significant stenosis of the internal carotid arteries. No significant stenosis of the anterior cerebral arteries. No significant stenosis of the middle cerebral arteries. No aneurysm. POSTERIOR CIRCULATION: The basilar artery is patent. The posterior cerebral arteries are patent bilaterally. The superior cerebellar arteries are patent bilaterally. Small posterior communicating arteries visualized bilaterally. The posterior inferior cerebellar arteries are visualized bilaterally. OTHER: Evolving bilateral cerebellar infarcts, particularly within the superior cerebral artery territories, with associated mass effect. There is partial effacement of the fourth ventricle. There is no CT evidence of large territory infarct within the supratentorial parenchyma. Question subtle increase in caliber of the lateral ventricles and third ventricle compared to prior without frank hydrocephalus on the current study. There is no midline shift. No acute intracranial hemorrhage. No dural venous sinus thrombosis on this non-dedicated study. IMPRESSION: 1. Evolving bilateral cerebellar infarcts, particularly within the superior cerebellar artery territories, with associated mass effect and  partial effacement of the fourth ventricle. 2. No acute intracranial hemorrhage. 3. Redemonstrated left vertebral artery dissection with occlusion of the proximal left V1 segment and reconstitution along the distal V2 segment. Increased irregularity of the left V3 segment with decreased vessel caliber, and slightly decreased caliber of the left V4 segment. 4. Anterior circulation is unremarkable. 5. Findings discussed with Dr. Vanessa at 1:24PM on 07/11/24. Electronically signed by: Donnice Mania MD 07/11/2024 01:48 PM EDT RP Workstation: [id number]        Scheduled Meds:  acetaminophen   1,000 mg Oral Q8H   aspirin  EC  81 mg Oral Daily   citalopram   20 mg Oral Daily   clopidogrel   75 mg Oral Daily   ferrous sulfate   325 mg Oral QODAY   polyethylene glycol  17 g Oral BID   rosuvastatin   20 mg Oral Daily   thiamine   100 mg Oral Daily   Continuous Infusions:  heparin      lactated ringers        LOS: 6 days    Time spent: over 30 min     Meliton Monte, MD [facility]   To contact the attending provider between 7A-7P or the covering provider during after hours 7P-7A, please log into the web site [email or web address] and access using universal [facility] password for that web site. If you do not have the password, please call the hospital operator.  07/11/2024, 3:05 PM

## 2024-07-12 ENCOUNTER — Inpatient Hospital Stay (HOSPITAL_COMMUNITY): Payer: MEDICAID

## 2024-07-12 DIAGNOSIS — E785 Hyperlipidemia, unspecified: Secondary | ICD-10-CM

## 2024-07-12 DIAGNOSIS — Q2112 Patent foramen ovale: Secondary | ICD-10-CM

## 2024-07-12 DIAGNOSIS — I63443 Cerebral infarction due to embolism of bilateral cerebellar arteries: Secondary | ICD-10-CM

## 2024-07-12 DIAGNOSIS — I69391 Dysphagia following cerebral infarction: Secondary | ICD-10-CM

## 2024-07-12 LAB — COMPREHENSIVE METABOLIC PANEL WITH GFR
ALT: 55 U/L — ABNORMAL HIGH (ref 0–44)
AST: 48 U/L — ABNORMAL HIGH (ref 15–41)
Albumin: 3.1 g/dL — ABNORMAL LOW (ref 3.5–5.0)
Alkaline Phosphatase: 58 U/L (ref 38–126)
Anion gap: 10 (ref 5–15)
BUN: 9 mg/dL (ref 6–20)
CO2: 21 mmol/L — ABNORMAL LOW (ref 22–32)
Calcium: 8.4 mg/dL — ABNORMAL LOW (ref 8.9–10.3)
Chloride: 111 mmol/L (ref 98–111)
Creatinine, Ser: 0.72 mg/dL (ref 0.44–1.00)
GFR, Estimated: >60 mL/min (ref >60)
Glucose, Bld: 77 mg/dL (ref 70–99)
Potassium: 3.7 mmol/L (ref 3.5–5.1)
Sodium: 142 mmol/L (ref 135–145)
Total Bilirubin: 0.5 mg/dL (ref 0.0–1.2)
Total Protein: 6 g/dL — ABNORMAL LOW (ref 6.5–8.1)

## 2024-07-12 LAB — CBC WITH DIFFERENTIAL/PLATELET
Abs Immature Granulocytes: 0.01 K/uL (ref 0.00–0.07)
Basophils Absolute: 0.1 K/uL (ref 0.0–0.1)
Basophils Relative: 1 %
Eosinophils Absolute: 0.2 K/uL (ref 0.0–0.5)
Eosinophils Relative: 3 %
HCT: 36.7 % (ref 36.0–46.0)
Hemoglobin: 12.1 g/dL (ref 12.0–15.0)
Immature Granulocytes: 0 %
Lymphocytes Relative: 29 %
Lymphs Abs: 1.9 K/uL (ref 0.7–4.0)
MCH: 25.6 pg — ABNORMAL LOW (ref 26.0–34.0)
MCHC: 33 g/dL (ref 30.0–36.0)
MCV: 77.8 fL — ABNORMAL LOW (ref 80.0–100.0)
Monocytes Absolute: 0.7 K/uL (ref 0.1–1.0)
Monocytes Relative: 10 %
Neutro Abs: 3.7 K/uL (ref 1.7–7.7)
Neutrophils Relative %: 57 %
Platelets: 119 K/uL — ABNORMAL LOW (ref 150–400)
RBC: 4.72 MIL/uL (ref 3.87–5.11)
RDW: 14.8 % (ref 11.5–15.5)
WBC: 6.5 K/uL (ref 4.0–10.5)
nRBC: 0 % (ref 0.0–0.2)

## 2024-07-12 LAB — PHOSPHORUS: Phosphorus: 3 mg/dL (ref 2.5–4.6)

## 2024-07-12 LAB — MAGNESIUM: Magnesium: 1.8 mg/dL (ref 1.7–2.4)

## 2024-07-12 LAB — HEPARIN LEVEL (UNFRACTIONATED): Heparin Unfractionated: 0.42 [IU]/mL (ref 0.30–0.70)

## 2024-07-12 MED ORDER — ORAL CARE MOUTH RINSE: 15.0000 mL | OROMUCOSAL | Status: DC | PRN

## 2024-07-12 NOTE — Evaluation (Signed)
 Modified Barium Swallow Study  Patient Details  Name: Cassidy Bruce MRN: [id number] Date of Birth: 05-08-1996  Today's Date: 07/12/2024  Modified Barium Swallow completed.  Full report located under Chart Review in the Imaging Section.  History of Present Illness Pt is a 28 yo female presenting to [facility] on 07/05/24 for dizziness, slurred speech, and impaired balance. MRI showed bilat cerebellar infarct with L>R. CTA head/neck showed proximal L vertebral artery dissection. Pt initially passed a swallow screen but was observed to have difficulty swallowing 9/7 and repeat swallow screen was not passed. PMH of ADHD, MDD.   Clinical Impression Pt presents with mild dysphagia. Oral phase fluctuates, at times occurring swiftly and with swift, complete posterior propulsion. Other times, her lingual transit is slow and she has more oral residue, although even when she only swallows a little bit of the bolus on the initial swallow, she uses additional swallows (usually 1-2) to effectively clear her oral cavity. There was a single instance of trace, transient aspiration on the initial spoonful of thin liquid (PAS 6, cleared with a spontaneous throat clear) that seemed to be more a result of reduced oral control, as pharyngeal phase otherwise was [facility] across the study. Recommend resuming regular solids and thin liquids.   Factors that may increase risk of adverse event in presence of aspiration Noe & Lianne 2021): Reduced cognitive function  Swallow Evaluation Recommendations Recommendations: PO diet PO Diet Recommendation: Regular;Thin liquids (Level 0) Liquid Administration via: Cup;Straw Medication Administration: Whole meds with liquid Supervision: Patient able to self-feed;Intermittent supervision/cueing for swallowing strategies Swallowing strategies  : Slow rate;Small bites/sips Postural changes: Position pt fully upright for meals Oral care recommendations: Oral care BID  (2x/day)      Cassidy SAILOR., Cassidy Bruce Acute Rehabilitation Services Office: [phone]  Secure chat preferred  07/12/2024,11:17 AM

## 2024-07-12 NOTE — Progress Notes (Signed)
 PHARMACY - ANTICOAGULATION CONSULT NOTE  Pharmacy Consult for Heparin  Indication: stroke  No Known Allergies  Patient Measurements: Height: 5' 4 (162.6 cm) Weight: 65.8 kg (145 lb) IBW/kg (Calculated) : 54.7 HEPARIN  DW (KG): 65.8  Vital Signs: Temp: 97.1 F (36.2 C) (09/08 1900) Temp Source: Oral (09/08 1900) BP: 135/83 (09/08 1900) Pulse Rate: 64 (09/08 1900)  Labs: Recent Labs    07/09/24 0355 07/10/24 0344 07/11/24 0435 07/11/24 2256  HGB 12.9 13.0 13.6  --   HCT 39.0 39.3 41.0  --   PLT 125* 135* 142*  --   HEPARINUNFRC  --   --   --  0.20*  CREATININE 1.02* 0.96 0.90  --     Estimated Creatinine Clearance: 87.6 mL/min (by C-G formula based on SCr of 0.9 mg/dL).   Medical History: Past Medical History:  Diagnosis Date   ADHD    Depression    Assessment: 28 year old female with CVA left vertebral artery dissection with bilateral SCA distribution cerebral infarct. No anticoagulation prior to admission. Pharmacy consulted for heparin .    Plan for 48hr heparin . Started 9/8 @ 15:15. Heparin  level 0.2 is subtherapeutic on 800 units/hr.  No issues with infusion or bleeding per RN other than menstrual cycle bleeding.  Goal of Therapy:  Heparin  level 0.3-0.5 units/ml Monitor platelets by anticoagulation protocol: Yes   Plan:  Increase Heparin  to 900 units / hr Monitor daily heparin  level, CBC, signs/symptoms of bleeding    Jinnie Door, PharmD, BCPS, BCCP Clinical Pharmacist  Please check AMION for all [facility] phone numbers After 10:00 PM, call Main Pharmacy [phone]

## 2024-07-12 NOTE — Progress Notes (Signed)
 Physical Therapy Treatment Patient Details Name: Cassidy Bruce MRN: [id number] DOB: September 06, 1996 Today's Date: 07/12/2024   History of Present Illness Pt is a 28 yo female presenting to [facility] on 07/05/24 for dizziness, slurred speech, and impaired balance. MRI showed bilat cerebellar infarct with L>R. CTA head/neck showed proximal L vertebral artery dissection. PMH of ADHD, MDD.    PT Comments  Pt progressing steadily towards her physical therapy goals and remains motivated to participate. Focus today on obstacle negotiation, step ups for LE power/strengthening and functional reaching in standing position. Pt requiring up to modA for postural stability and balance. Patient will benefit from intensive inpatient follow-up therapy, >3 hours/day.    If plan is discharge home, recommend the following: A lot of help with bathing/dressing/bathroom;Assistance with feeding;Assistance with cooking/housework;Assist for transportation;Help with stairs or ramp for entrance;A lot of help with walking and/or transfers   Can travel by private vehicle        Equipment Recommendations  Other (comment) (TBD)    Recommendations for Other Services Rehab consult     Precautions / Restrictions Precautions Precautions: Fall Recall of Precautions/Restrictions: Impaired Restrictions Weight Bearing Restrictions Per Provider Order: No     Mobility  Bed Mobility Overal bed mobility: Needs Assistance Bed Mobility: Supine to Sit   Sidelying to sit: Supervision            Transfers Overall transfer level: Needs assistance Equipment used: Rolling walker (2 wheels) Transfers: Sit to/from Stand Sit to Stand: Min assist           General transfer comment: Good power up, slight posterior LOB upon initial standing requiring minA    Ambulation/Gait Ambulation/Gait assistance: Mod assist, +2 safety/equipment Gait Distance (Feet): 100 Feet Assistive device: Rolling walker (2 wheels) Gait  Pattern/deviations: Ataxic, Narrow base of support, Step-through pattern Gait velocity: decreased     General Gait Details: Good posture, up to modA for postural stability and with obstacle negotiation   Stairs Stairs: Yes Stairs assistance: Contact guard assist Stair Management: Two rails Number of Stairs: 10     Wheelchair Mobility     Tilt Bed    Modified Rankin (Stroke Patients Only) Modified Rankin (Stroke Patients Only) Pre-Morbid Rankin Score: No symptoms Modified Rankin: Moderately severe disability     Balance Overall balance assessment: Needs assistance Sitting-balance support: Feet supported Sitting balance-Leahy Scale: Fair     Standing balance support: Bilateral upper extremity supported, During functional activity, Reliant on assistive device for balance Standing balance-Leahy Scale: Poor Standing balance comment: Reliant on UE support and mod A                            Communication Communication Communication: Impaired Factors Affecting Communication: Difficulty expressing self;Reduced clarity of speech  Cognition Arousal: Alert Behavior During Therapy: WFL for tasks assessed/performed   PT - Cognitive impairments: Attention, Sequencing, Problem solving, Safety/Judgement                       PT - Cognition Comments: Improving cognition Following commands: Impaired Following commands impaired: Follows multi-step commands inconsistently, Only follows one step commands consistently    Cueing Cueing Techniques: Verbal cues, Tactile cues  Exercises Other Exercises Other Exercises: Standing: functional reaching with BUE's with cognitive task of locating letters on bulletin board Other Exercises: x10 step ups leading with LLE, x 10 step ups leading with RLE (seated rest break in between bouts)    General Comments  Pertinent Vitals/Pain Pain Assessment Pain Assessment: Faces Faces Pain Scale: No hurt    Home  Living                          Prior Function            PT Goals (current goals can now be found in the care plan section) Acute Rehab PT Goals Patient Stated Goal: be independent PT Goal Formulation: With patient Time For Goal Achievement: 07/21/24 Potential to Achieve Goals: Good Progress towards PT goals: Progressing toward goals    Frequency    Min 3X/week      PT Plan      Co-evaluation              AM-PAC PT 6 Clicks Mobility   Outcome Measure  Help needed turning from your back to your side while in a flat bed without using bedrails?: A Little Help needed moving from lying on your back to sitting on the side of a flat bed without using bedrails?: A Little Help needed moving to and from a bed to a chair (including a wheelchair)?: A Little Help needed standing up from a chair using your arms (e.g., wheelchair or bedside chair)?: A Little Help needed to walk in hospital room?: A Lot Help needed climbing 3-5 steps with a railing? : A Lot 6 Click Score: 16    End of Session Equipment Utilized During Treatment: Gait belt Activity Tolerance: Patient tolerated treatment well Patient left: with call bell/phone within reach;with family/visitor present;in chair Nurse Communication: Mobility status PT Visit Diagnosis: Unsteadiness on feet (R26.81);Ataxic gait (R26.0);Difficulty in walking, not elsewhere classified (R26.2)     Time: 8875-8843 PT Time Calculation (min) (ACUTE ONLY): 32 min  Charges:    $Therapeutic Activity: 23-37 mins PT General Charges $$ ACUTE PT VISIT: 1 Visit                     Aleck Daring, PT, DPT Acute Rehabilitation Services Office [phone]    Aleck ONEIDA Daring 07/12/2024, 2:12 PM

## 2024-07-12 NOTE — Plan of Care (Signed)

## 2024-07-12 NOTE — Progress Notes (Signed)
 PHARMACY - ANTICOAGULATION CONSULT NOTE  Pharmacy Consult for Heparin  Indication: stroke  No Known Allergies  Patient Measurements: Height: 5' 4 (162.6 cm) Weight: 65.8 kg (145 lb) IBW/kg (Calculated) : 54.7 HEPARIN  DW (KG): 65.8  Vital Signs: Temp: 98.2 F (36.8 C) (09/09 0742) Temp Source: Oral (09/09 0742) BP: 124/76 (09/09 0742) Pulse Rate: 60 (09/09 0742)  Labs: Recent Labs    07/10/24 0344 07/11/24 0435 07/11/24 2256 07/12/24 0426  HGB 13.0 13.6  --  12.1  HCT 39.3 41.0  --  36.7  PLT 135* 142*  --  119*  HEPARINUNFRC  --   --  0.20* 0.42  CREATININE 0.96 0.90  --  0.72    Estimated Creatinine Clearance: 98.6 mL/min (by C-G formula based on SCr of 0.72 mg/dL).   Medical History: Past Medical History:  Diagnosis Date   ADHD    Depression    Assessment: 28 year old female with CVA left vertebral artery dissection with bilateral SCA distribution cerebral infarct. No anticoagulation prior to admission. Pharmacy consulted for heparin .    Plan for 48hr heparin . Started 9/8 @ 15:15. Repeat HL therapeutic at 0.42 with heparin  running at 900 units/hour. CBC stable.   Goal of Therapy:  Heparin  level 0.3-0.5 units/ml Monitor platelets by anticoagulation protocol: Yes   Plan:  Continue Heparin  at 900 units / hr Monitor daily heparin  level, CBC, signs/symptoms of bleeding    Massie Fila, PharmD Clinical Pharmacist  07/12/2024 10:01 AM
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
## 2024-07-12 NOTE — Plan of Care (Signed)
 Problem: Education: Goal: Knowledge of disease or condition will improve 07/12/2024 0340 by Venessa Odella KIDD, RN Outcome: Progressing 07/12/2024 0340 by Venessa Odella KIDD, RN Outcome: Progressing Goal: Knowledge of secondary prevention will improve (MUST DOCUMENT ALL) 07/12/2024 0340 by Venessa Odella KIDD, RN Outcome: Progressing 07/12/2024 0340 by Venessa Odella KIDD, RN Outcome: Progressing Goal: Knowledge of patient specific risk factors will improve (DELETE if not current risk factor) 07/12/2024 0340 by Venessa Odella KIDD, RN Outcome: Progressing 07/12/2024 0340 by Venessa Odella KIDD, RN Outcome: Progressing   Problem: Ischemic Stroke/TIA Tissue Perfusion: Goal: Complications of ischemic stroke/TIA will be minimized 07/12/2024 0340 by Venessa Odella KIDD, RN Outcome: Progressing 07/12/2024 0340 by Venessa Odella KIDD, RN Outcome: Progressing   Problem: Coping: Goal: Will verbalize positive feelings about self 07/12/2024 0340 by Venessa Odella KIDD, RN Outcome: Progressing 07/12/2024 0340 by Venessa Odella KIDD, RN Outcome: Progressing Goal: Will identify appropriate support needs 07/12/2024 0340 by Venessa Odella KIDD, RN Outcome: Progressing 07/12/2024 0340 by Venessa Odella KIDD, RN Outcome: Progressing   Problem: Health Behavior/Discharge Planning: Goal: Ability to manage health-related needs will improve 07/12/2024 0340 by Venessa Odella KIDD, RN Outcome: Progressing 07/12/2024 0340 by Venessa Odella KIDD, RN Outcome: Progressing Goal: Goals will be collaboratively established with patient/family 07/12/2024 0340 by Venessa Odella KIDD, RN Outcome: Progressing 07/12/2024 0340 by Venessa Odella KIDD, RN Outcome: Progressing   Problem: Self-Care: Goal: Ability to participate in self-care as condition permits will improve 07/12/2024 0340 by Venessa Odella KIDD, RN Outcome: Progressing 07/12/2024 0340 by Venessa Odella KIDD, RN Outcome: Progressing Goal: Verbalization of feelings and concerns over difficulty with  self-care will improve 07/12/2024 0340 by Venessa Odella KIDD, RN Outcome: Progressing 07/12/2024 0340 by Venessa Odella KIDD, RN Outcome: Progressing Goal: Ability to communicate needs accurately will improve 07/12/2024 0340 by Venessa Odella KIDD, RN Outcome: Progressing 07/12/2024 0340 by Venessa Odella KIDD, RN Outcome: Progressing   Problem: Nutrition: Goal: Risk of aspiration will decrease 07/12/2024 0340 by Venessa Odella KIDD, RN Outcome: Progressing 07/12/2024 0340 by Venessa Odella KIDD, RN Outcome: Progressing Goal: Dietary intake will improve 07/12/2024 0340 by Venessa Odella KIDD, RN Outcome: Progressing 07/12/2024 0340 by Venessa Odella KIDD, RN Outcome: Progressing   Problem: Education: Goal: Knowledge of General Education information will improve Description: Including pain rating scale, medication(s)/side effects and non-pharmacologic comfort measures 07/12/2024 0340 by Venessa Odella KIDD, RN Outcome: Progressing 07/12/2024 0340 by Venessa Odella KIDD, RN Outcome: Progressing   Problem: Health Behavior/Discharge Planning: Goal: Ability to manage health-related needs will improve 07/12/2024 0340 by Venessa Odella KIDD, RN Outcome: Progressing 07/12/2024 0340 by Venessa Odella KIDD, RN Outcome: Progressing   Problem: Clinical Measurements: Goal: Ability to maintain clinical measurements within normal limits will improve 07/12/2024 0340 by Venessa Odella KIDD, RN Outcome: Progressing 07/12/2024 0340 by Venessa Odella KIDD, RN Outcome: Progressing Goal: Will remain free from infection 07/12/2024 0340 by Venessa Odella KIDD, RN Outcome: Progressing 07/12/2024 0340 by Venessa Odella KIDD, RN Outcome: Progressing Goal: Diagnostic test results will improve 07/12/2024 0340 by Venessa Odella KIDD, RN Outcome: Progressing 07/12/2024 0340 by Venessa Odella KIDD, RN Outcome: Progressing Goal: Respiratory complications will improve 07/12/2024 0340 by Venessa Odella KIDD, RN Outcome: Progressing 07/12/2024 0340 by Venessa Odella KIDD,  RN Outcome: Progressing Goal: Cardiovascular complication will be avoided 07/12/2024 0340 by Venessa Odella KIDD, RN Outcome: Progressing 07/12/2024 0340 by Venessa Odella KIDD, RN Outcome: Progressing   Problem: Activity: Goal: Risk for activity intolerance will decrease 07/12/2024 0340 by Venessa Odella KIDD, RN Outcome: Progressing 07/12/2024 0340 by  Alric Geise O, RN Outcome: Progressing   Problem: Nutrition: Goal: Adequate nutrition will be maintained 07/12/2024 0340 by Venessa Odella KIDD, RN Outcome: Progressing 07/12/2024 0340 by Venessa Odella KIDD, RN Outcome: Progressing   Problem: Coping: Goal: Level of anxiety will decrease 07/12/2024 0340 by Venessa Odella KIDD, RN Outcome: Progressing 07/12/2024 0340 by Venessa Odella KIDD, RN Outcome: Progressing   Problem: Elimination: Goal: Will not experience complications related to bowel motility 07/12/2024 0340 by Venessa Odella KIDD, RN Outcome: Progressing 07/12/2024 0340 by Venessa Odella KIDD, RN Outcome: Progressing Goal: Will not experience complications related to urinary retention 07/12/2024 0340 by Venessa Odella KIDD, RN Outcome: Progressing 07/12/2024 0340 by Venessa Odella KIDD, RN Outcome: Progressing   Problem: Pain Managment: Goal: General experience of comfort will improve and/or be controlled 07/12/2024 0340 by Venessa Odella KIDD, RN Outcome: Progressing 07/12/2024 0340 by Venessa Odella KIDD, RN Outcome: Progressing   Problem: Safety: Goal: Ability to remain free from injury will improve 07/12/2024 0340 by Venessa Odella KIDD, RN Outcome: Progressing 07/12/2024 0340 by Venessa Odella KIDD, RN Outcome: Progressing   Problem: Skin Integrity: Goal: Risk for impaired skin integrity will decrease 07/12/2024 0340 by Venessa Odella KIDD, RN Outcome: Progressing 07/12/2024 0340 by Venessa Odella KIDD, RN Outcome: Progressing

## 2024-07-12 NOTE — Progress Notes (Signed)
 Inpatient Rehab Admissions Coordinator:   I have no beds available for this patient to admit to CIR today.  Will continue to follow for timing of potential admission pending bed availability.   Reche Lowers, PT, DPT Admissions Coordinator [phone] 07/12/24  2:03 PM

## 2024-07-12 NOTE — Progress Notes (Signed)
 PROGRESS NOTE    Cassidy Bruce  FMW:[id number] DOB: [birth year] DOA: 07/05/2024 PCP: Pcp, No  Chief Complaint  Patient presents with   Seizures    Brief Narrative:   Cassidy Bruce is Cassidy Bruce 28 year old with Cassidy Bruce past medical history significant for ADHD and depression who presented to the hospital with seizure-like activity and has been having intermittent headaches. Yesterday around 3:30 PM she became unresponsive was noted to be shaking all over and would not follow commands or answer questions. She was brought to the ED for further evaluation and acetaminophen  and salicylate levels were negative and alcohol was negative. She appeared ataxic and CTA of the head neck was done and showed Cassidy Bruce proximal left vertebral artery dissection. Neuro was consulted and recommended stroke workup and transferring the the patient to [facility] for further evaluation. He continues to complain of severe headache and continues to have incoordination, imbalance and slurred speech.   Assessment & Plan:   Principal Problem:   Stroke [facility]) Active Problems:   MDD (major depressive disorder), recurrent episode, severe (HCC)   Hypokalemia   PFO (patent foramen ovale)   Vertebral artery dissection (HCC)  Acute Stroke She complained of dysphagia overnight 9/7-8  - she had new left sided weakness that started around the same time. Repeat 9/8 CTA head/neck with evovlving bilateral cerebellar infarcts, particularly within the superior cerebellar artery territories with associated mass effect and partial effacement of the 4th ventricle.  L vertebral artery dissection (see report). Repeat MRI with evolving early subacute bilateral cerebellar infarcts, L slightly worse than R - possible mild interval expansion vs changes of wallerian degeneration involving the midbrain since previous - associated petechial hemorrhage without frank hemorrhagic transformation.  Loss of normal flow void within the L vertebral artery c/w previously  identified dissection.   MRI with acute infarcts within the cerebellar vermis and bilateral cerebellar hemispheres.  Large acute infarcts present within the superior cerebellar artery territories bilaterally.  Posterior fossa mass effect without cerebellar tonsillar herniation or evidence of obstructive hydrocephalus.  Petechial hemorrhage within the cerebellar vermis and suerior R cerebellar hemisphere. CTA head/neck with proximal L vertebral artery dissection, 13 ml region of ischemia affecting both cerebellar hemispheres, worse on L TEE with PFO  Transcranial doppler with PFO spencer grade II lower end of scale at rest and higher end of scale with valsava LE US  negative for DVT Echo with EF 60-65%, no RWMA LDL 104, A1c 5.4 ANA negative, cardiolipin ab negative, serum homecysteine wnl, beta 2 glycoprotein ab wnl, lupus anticoagulant not detected.  vitamin b1 wnl.  Appreciate neurology assistance - bilateral cerebellar infarct, embolic vs L VA occlusion/dissection - recommending DAPT x3 months, then ASA alone.  Now on heparin  gtt in addition to DAPT per neurology - plan for 48 hrs heparin .  Crestor  20 mg daily (stop if pregnant or planning pregnancy).  To be referred to cards for consideration of PFO closure.  NSVT Normal EF Noted, follow on tele  Replace lytes as needed  Dyslipidemia Statin   Acute Kidney Injury Very mild, trend  Microcytosis Low normal ferritin, start every other day iron   Thrombocytopenia Trend, fluctuating  Mood Disorder Depressed Mood Denied SI Celexa  Will ask psych to see her  Complex Family Dynamics At this point in time, she's deferring to her friend Cassidy Bruce  Birth Control  Nexplanon  in place (L arm) - appears it was placed 08/2020 Nexplanon  d/c'd 9/6 Will need to follow outpatient with gyn for future contraceptive counseling  Leukocytosis Resolved  Elevated LFT's Trend   Homelessness TOC, appreciate assistance    DVT prophylaxis:  SCD Code Status: full Family Communication: mother, aunt, friend Disposition:   Status is: Inpatient Remains inpatient appropriate because: need for continued inpatient care   Consultants:  Neurology cards  Procedures:  LE US  Summary:  RIGHT:      - There is no evidence of deep vein thrombosis in the lower extremity.    - No cystic structure found in the popliteal fossa.    LEFT:      - There is no evidence of deep vein thrombosis in the lower extremity.    - No cystic structure found in the popliteal fossa.   TEE IMPRESSIONS     1. Left ventricular ejection fraction, by estimation, is 60 to 65%. The  left ventricle has normal function.   2. Right ventricular systolic function is normal. The right ventricular  size is normal.   3. No left atrial/left atrial appendage thrombus was detected. The LAA  emptying velocity was 64 cm/s.   4. Distal anterior mitral leaflet tip appears to have minimal prolapse of  fibrinous appearing structures. The mitral valve is abnormal. Trivial  mitral valve regurgitation. No evidence of mitral stenosis.   5. The aortic valve is tricuspid. Aortic valve regurgitation is not  visualized. No aortic stenosis is present.   6. Evidence of atrial level shunting detected by color flow Doppler.  Agitated saline contrast bubble study was positive with shunting observed  within 3-6 cardiac cycles suggestive of interatrial shunt. There is Cassidy Bruce  small patent foramen ovale with  bidirectional shunting across atrial septum.   7. 3D performed of the mitral valve and 3D performed of the atrial septum  and demonstrates Distal anterior mitral leaflet tip appears to have  minimal prolapse of fibrinous appearing structures. PFO visualized.   Conclusion(s)/Recommendation(s): PFO seen by color Doppler (left to right  shunt). Agitated saline at rest was late positive for shunt, but agitated  saline with abdominal pressure (Valsalva) was rapidly positive,  consistent  with right to left intra-atrial   shunt.   Echo IMPRESSIONS     1. Left ventricular ejection fraction, by estimation, is 60 to 65%. The  left ventricle has normal function. The left ventricle has no regional  wall motion abnormalities. Left ventricular diastolic parameters were  normal.   2. Right ventricular systolic function is normal. The right ventricular  size is normal. There is normal pulmonary artery systolic pressure.   3. The mitral valve is normal in structure. Mild mitral valve  regurgitation. No evidence of mitral stenosis.   4. The aortic valve is tricuspid. Aortic valve regurgitation is not  visualized. visually opens well but gradient not assessed.   5. The inferior vena cava is normal in size with greater than 50%  respiratory variability, suggesting right atrial pressure of 3 mmHg.   Antimicrobials:  Anti-infectives (From admission, onward)    None       Subjective: No new complaints  Objective: Vitals:   07/12/24 0430 07/12/24 0742 07/12/24 1101 07/12/24 1542  BP: 131/88 124/76 125/89 129/67  Pulse: 74 60 68 62  Resp: 19 18 18 18   Temp: 97.8 F (36.6 C) 98.2 F (36.8 C) 98.8 F (37.1 C) 98.5 F (36.9 C)  TempSrc: Oral Oral Oral Oral  SpO2: 100% 99% 98% 100%  Weight:      Height:        Intake/Output Summary (Last 24 hours) at 07/12/2024 1947 Last data  filed at 07/12/2024 1841 Gross per 24 hour  Intake 1757.12 ml  Output --  Net 1757.12 ml    Filed Weights   07/11/24 1400  Weight: 65.8 kg    Examination:  General: No acute distress. Cardiovascular: RRR Lungs: unlabored Neurological: dysarthria, LUE weakness Extremities: No clubbing or cyanosis. No edema.   Data Reviewed: I have personally reviewed following labs and imaging studies  CBC: Recent Labs  Lab 07/07/24 0317 07/09/24 0355 07/10/24 0344 07/11/24 0435 07/12/24 0426  WBC 10.8* 8.1 7.3 9.0 6.5  NEUTROABS 7.8* 5.0 4.3 7.1 3.7  HGB 13.1 12.9 13.0 13.6 12.1   HCT 40.1 39.0 39.3 41.0 36.7  MCV 78.9* 77.7* 77.4* 77.5* 77.8*  PLT 156 125* 135* 142* 119*    Basic Metabolic Panel: Recent Labs  Lab 07/07/24 0317 07/09/24 0355 07/10/24 0344 07/11/24 0435 07/12/24 0426  NA 143 143 144 142 142  K 4.0 3.5 3.6 4.2 3.7  CL 110 111 108 108 111  CO2 24 22 23  21* 21*  GLUCOSE 105* 80 85 91 77  BUN 8 10 11 11 9   CREATININE 1.06* 1.02* 0.96 0.90 0.72  CALCIUM  8.5* 8.9 9.0 9.1 8.4*  MG 1.9 1.8 2.0 2.0 1.8  PHOS 3.0 3.5 4.0 3.6 3.0    GFR: Estimated Creatinine Clearance: 98.6 mL/min (by C-G formula based on SCr of 0.72 mg/dL).  Liver Function Tests: Recent Labs  Lab 07/07/24 0317 07/09/24 0355 07/10/24 0344 07/11/24 0435 07/12/24 0426  AST 33 19 18 24  48*  ALT 48* 31 25 30  55*  ALKPHOS 62 62 69 66 58  BILITOT 0.5 0.6 0.7 0.6 0.5  PROT 6.4* 6.6 6.7 7.1 6.0*  ALBUMIN 3.3* 3.4* 3.4* 3.5 3.1*    CBG: No results for input(s): GLUCAP in the last 168 hours.    Recent Results (from the past 240 hours)  Wet prep, genital     Status: None   Collection Time: 07/09/24  6:51 PM   Specimen: PATH Cytology Urine  Result Value Ref Range Status   Yeast Wet Prep HPF POC NONE SEEN NONE SEEN Final   Trich, Wet Prep NONE SEEN NONE SEEN Final   Clue Cells Wet Prep HPF POC NONE SEEN NONE SEEN Final   WBC, Wet Prep HPF POC <10 <10 Final   Sperm NONE SEEN  Final    Comment: Performed at [facility], [address], [place], [place] [address]         Radiology Studies: DG Swallowing Func-Speech Pathology Result Date: 07/12/2024 Table formatting from the original result was not included. Modified Barium Swallow Study Patient Details Name: Tunisha Ruland MRN: [id number] Date of Birth: 07-21-1996 Today's Date: 07/12/2024 HPI/PMH: HPI: Pt is Barton Want 28 yo female presenting to [facility] on 07/05/24 for dizziness, slurred speech, and impaired balance. MRI showed bilat cerebellar infarct with L>R. CTA head/neck showed proximal L vertebral artery dissection. Pt  initially passed Dajon Rowe swallow screen but was observed to have difficulty swallowing 9/7 and repeat swallow screen was not passed. PMH of ADHD, MDD. Clinical Impression: Clinical Impression: Pt presents with mild dysphagia. Oral phase fluctuates, at times occurring swiftly and with swift, complete posterior propulsion. Other times, her lingual transit is slow and she has more oral residue, although even when she only swallows Candies Palm little bit of the bolus on the initial swallow, she uses additional swallows (usually 1-2) to effectively clear her oral cavity. There was Asha Grumbine single instance of trace, transient aspiration on the initial spoonful  of thin liquid (PAS 6, cleared with Pascual Mantel spontaneous throat clear) that seemed to be more Rylie Limburg result of reduced oral control, as pharyngeal phase otherwise was [facility] across the study. Recommend resuming regular solids and thin liquids. Factors that may increase risk of adverse event in presence of aspiration Noe & Lianne 2021): Factors that may increase risk of adverse event in presence of aspiration Noe & Lianne 2021): Reduced cognitive function Recommendations/Plan: Swallowing Evaluation Recommendations Swallowing Evaluation Recommendations Recommendations: PO diet PO Diet Recommendation: Regular; Thin liquids (Level 0) Liquid Administration via: Cup; Straw Medication Administration: Whole meds with liquid Supervision: Patient able to self-feed; Intermittent supervision/cueing for swallowing strategies Swallowing strategies  : Slow rate; Small bites/sips Postural changes: Position pt fully upright for meals Oral care recommendations: Oral care BID (2x/day) Treatment Plan Treatment Plan Treatment recommendations: Therapy as outlined in treatment plan below Follow-up recommendations: Acute inpatient rehab (3 hours/day) Functional status assessment: Patient has had Marybeth Dandy recent decline in their functional status and demonstrates the ability to make significant improvements in function in Jadier Rockers  reasonable and predictable amount of time. Treatment frequency: Min 2x/week Treatment duration: 2 weeks Interventions: Aspiration precaution training; Patient/family education; Diet toleration management by SLP Recommendations Recommendations for follow up therapy are one component of Deazia Lampi multi-disciplinary discharge planning process, led by the attending physician.  Recommendations may be updated based on patient status, additional functional criteria and insurance authorization. Assessment: Orofacial Exam: Orofacial Exam Oral Cavity: Oral Hygiene: WFL Oral Cavity - Dentition: Adequate natural dentition Orofacial Anatomy: WFL Oral Motor/Sensory Function: WFL Anatomy: Anatomy: WFL Boluses Administered: Boluses Administered Boluses Administered: Thin liquids (Level 0); Mildly thick liquids (Level 2, nectar thick); Puree; Solid  Oral Impairment Domain: Oral Impairment Domain Lip Closure: No labial escape Tongue control during bolus hold: Escape to lateral buccal cavity/floor of mouth Bolus preparation/mastication: Slow prolonged chewing/mashing with complete recollection Bolus transport/lingual motion: Slow tongue motion Oral residue: Majority of bolus remaining (piecemeal) Location of oral residue : Tongue Initiation of pharyngeal swallow : Valleculae  Pharyngeal Impairment Domain: Pharyngeal Impairment Domain Soft palate elevation: No bolus between soft palate (SP)/pharyngeal wall (PW) Laryngeal elevation: Complete superior movement of thyroid cartilage with complete approximation of arytenoids to epiglottic petiole Anterior hyoid excursion: Partial anterior movement Epiglottic movement: Complete inversion Laryngeal vestibule closure: Complete, no air/contrast in laryngeal vestibule Pharyngeal stripping wave : Present - complete Pharyngeal contraction (Jovaun Levene/P view only): N/Charistopher Rumble Pharyngoesophageal segment opening: Complete distension and complete duration, no obstruction of flow Tongue base retraction: No contrast between  tongue base and posterior pharyngeal wall (PPW) Pharyngeal residue: Complete pharyngeal clearance Location of pharyngeal residue: N/Harrington Jobe  Esophageal Impairment Domain: Esophageal Impairment Domain Esophageal clearance upright position: Complete clearance, esophageal coating Pill: Pill Consistency administered: Thin liquids (Level 0) Thin liquids (Level 0): [facility] Penetration/Aspiration Scale Score: Penetration/Aspiration Scale Score 1.  Material does not enter airway: Mildly thick liquids (Level 2, nectar thick); Puree; Solid; Pill 6.  Material enters airway, passes BELOW cords then ejected out: Thin liquids (Level 0) (x1 on initial spoonful of thin liquid) Compensatory Strategies: Compensatory Strategies Compensatory strategies: No   General Information: Caregiver present: No  Diet Prior to this Study: NPO   Temperature : Normal   Respiratory Status: WFL   Supplemental O2: None (Room air)   History of Recent Intubation: No  Behavior/Cognition: Alert; Cooperative; Requires cueing Self-Feeding Abilities: Able to self-feed Baseline vocal quality/speech: Other (comment) (monotone) Volitional Cough: Able to elicit Volitional Swallow: Able to elicit Exam Limitations: No limitations Goal Planning: Prognosis for improved  oropharyngeal function: Good Barriers to Reach Goals: Cognitive deficits No data recorded Patient/Family Stated Goal: none stated Consulted and agree with results and recommendations: Patient Pain: Pain Assessment Pain Assessment: Faces Faces Pain Scale: 0 End of Session: Start Time:SLP Start Time (ACUTE ONLY): 1016 Stop Time: SLP Stop Time (ACUTE ONLY): 1030 Time Calculation:SLP Time Calculation (min) (ACUTE ONLY): 14 min Charges: SLP Evaluations $ SLP Speech Visit: 1 Visit SLP Evaluations $BSS Swallow: 1 Procedure $MBS Swallow: 1 Procedure $Speech Treatment for Individual: 1 Procedure SLP visit diagnosis: SLP Visit Diagnosis: Dysphagia, oral phase (R13.11) Past Medical History: Past Medical History: Diagnosis  Date  ADHD   Depression  Past Surgical History: Past Surgical History: Procedure Laterality Date  TRANSESOPHAGEAL ECHOCARDIOGRAM (CATH LAB) N/Nadiah Corbit 07/07/2024  Procedure: TRANSESOPHAGEAL ECHOCARDIOGRAM;  Surgeon: Lonni Slain, MD;  Location: [facility] INVASIVE CV LAB;  Service: Cardiovascular;  Laterality: N/Shamica Moree; Leita SAILOR., M.Aras Albarran. CCC-SLP Acute Rehabilitation Services Office: [phone] Secure chat preferred 07/12/2024, 11:21 AM  MR BRAIN WO CONTRAST Result Date: 07/11/2024 CLINICAL DATA:  Follow-up examination for stroke EXAM: MRI HEAD WITHOUT CONTRAST TECHNIQUE: Multiplanar, multiecho pulse sequences of the brain and surrounding structures were obtained without intravenous contrast. COMPARISON:  Prior MRI from 07/06/2024 FINDINGS: Brain: Previously identified bilateral cerebellar infarcts again seen, primarily involving the SCA distributions, left slightly worse than right, stable. Possible further extension versus wallerian degeneration seen involving the midbrain since previous (series 2, images 19, 20). Otherwise, overall distribution is relatively stable. Associated susceptibility artifact consistent with petechial hemorrhage. Superimposed intrinsic T1 hyperintensity at the right greater than left cerebellum. No frank hyperdense blood products or intraparenchymal hematoma seen on CT from earlier the same day. Otherwise, remainder the brain remains normal in appearance. No other evidence for acute or subacute ischemia. No other areas of chronic cortical infarction. No other acute or chronic intracranial blood products. No mass lesion or midline shift. No hydrocephalus or extra-axial fluid collection. Basilar cisterns remain patent. Pituitary gland suprasellar region within normal limits. Vascular: Loss of normal flow void within the left vertebral artery, consistent with previously identified dissection. Major intracranial vascular flow voids are otherwise maintained. Skull and upper cervical spine: Craniocervical  junction within normal limits. Bone marrow signal intensity felt to be within normal limits for age. No scalp soft tissue abnormality. Sinuses/Orbits: Globes orbital soft tissues within normal limits. Paranasal sinuses are clear. No significant mastoid effusion. Other: None. IMPRESSION: 1. Evolving early subacute bilateral cerebellar infarcts, left slightly worse than right. Possible mild interval expansion versus changes of Wallerian degeneration (favored) involving the midbrain since previous. Associated petechial hemorrhage without frank hemorrhagic transformation. 2. Loss of normal flow void within the left vertebral artery, consistent with previously identified dissection. 3. Otherwise stable and normal brain MRI. Electronically Signed   By: Morene Hoard M.D.   On: 07/11/2024 19:30   CT ANGIO HEAD NECK W WO CM Result Date: 07/11/2024 EXAM: CTA HEAD AND NECK WITH AND WITHOUT 07/11/2024 12:58:50 PM TECHNIQUE: CTA of the head and neck was performed with and without the administration of intravenous contrast. Multiplanar 2D and/or 3D reformatted images are provided for review. Automated exposure control, iterative reconstruction, and/or weight based adjustment of the mA/kV was utilized to reduce the radiation dose to as low as reasonably achievable. Stenosis of the internal carotid arteries measured using NASCET criteria. COMPARISON: MRI head 07/06/24 and CTA head and neck and CT head 07/05/24 CLINICAL HISTORY: Neuro deficit, acute, stroke suspected. FINDINGS: CTA NECK: AORTIC ARCH AND ARCH VESSELS: No dissection or arterial injury. No significant stenosis  of the brachiocephalic or subclavian arteries. Common aeration of the brachiocephalic and left common carotid arteries. CERVICAL CAROTID ARTERIES: No dissection, arterial injury, or hemodynamically significant stenosis by NASCET criteria. CERVICAL VERTEBRAL ARTERIES: The right vertebral artery is patent from the origin to the vertebrobasilar confluence.  Redemonstrated left vertebral artery dissection. There is occlusion of the proximal left V1 segment. Reconstitution along the distal V2 segment is similar to prior. There is increased irregularity of the left V3 segment with decreased vessel caliber. The left V4 segment also appears slightly decreased in caliber. LUNGS AND MEDIASTINUM: Unremarkable. SOFT TISSUES: No acute abnormality. BONES: No acute abnormality. CTA HEAD: ANTERIOR CIRCULATION: No significant stenosis of the internal carotid arteries. No significant stenosis of the anterior cerebral arteries. No significant stenosis of the middle cerebral arteries. No aneurysm. POSTERIOR CIRCULATION: The basilar artery is patent. The posterior cerebral arteries are patent bilaterally. The superior cerebellar arteries are patent bilaterally. Small posterior communicating arteries visualized bilaterally. The posterior inferior cerebellar arteries are visualized bilaterally. OTHER: Evolving bilateral cerebellar infarcts, particularly within the superior cerebral artery territories, with associated mass effect. There is partial effacement of the fourth ventricle. There is no CT evidence of large territory infarct within the supratentorial parenchyma. Question subtle increase in caliber of the lateral ventricles and third ventricle compared to prior without frank hydrocephalus on the current study. There is no midline shift. No acute intracranial hemorrhage. No dural venous sinus thrombosis on this non-dedicated study. IMPRESSION: 1. Evolving bilateral cerebellar infarcts, particularly within the superior cerebellar artery territories, with associated mass effect and partial effacement of the fourth ventricle. 2. No acute intracranial hemorrhage. 3. Redemonstrated left vertebral artery dissection with occlusion of the proximal left V1 segment and reconstitution along the distal V2 segment. Increased irregularity of the left V3 segment with decreased vessel caliber, and  slightly decreased caliber of the left V4 segment. 4. Anterior circulation is unremarkable. 5. Findings discussed with Dr. Vanessa at 1:24PM on 07/11/24. Electronically signed by: Donnice Mania MD 07/11/2024 01:48 PM EDT RP Workstation: [id number]        Scheduled Meds:  acetaminophen   1,000 mg Oral Q8H   aspirin  EC  81 mg Oral Daily   citalopram   20 mg Oral Daily   clopidogrel   75 mg Oral Daily   ferrous sulfate   325 mg Oral QODAY   polyethylene glycol  17 g Oral BID   rosuvastatin   20 mg Oral Daily   thiamine   100 mg Oral Daily   Continuous Infusions:  sodium chloride  100 mL/hr at 07/12/24 1214   heparin  900 Units/hr (07/12/24 1804)     LOS: 7 days    Time spent: over 30 min     Meliton Monte, MD [facility]   To contact the attending provider between 7A-7P or the covering provider during after hours 7P-7A, please log into the web site [email or web address] and access using universal [place] password for that web site. If you do not have the password, please call the hospital operator.  07/12/2024, 7:47 PM

## 2024-07-13 ENCOUNTER — Other Ambulatory Visit: Payer: Self-pay

## 2024-07-13 ENCOUNTER — Inpatient Hospital Stay (HOSPITAL_COMMUNITY)
Admission: AD | Admit: 2024-07-13 | Discharge: 2024-08-03 | DRG: 056 | Disposition: A | Discharge status: Home / Self Care | Payer: MEDICAID | Source: Intra-hospital | Attending: Physical Medicine & Rehabilitation | Admitting: Physical Medicine & Rehabilitation

## 2024-07-13 ENCOUNTER — Encounter (HOSPITAL_COMMUNITY): Payer: Self-pay | Admitting: Physical Medicine and Rehabilitation

## 2024-07-13 DIAGNOSIS — F4001 Agoraphobia with panic disorder: Secondary | ICD-10-CM

## 2024-07-13 DIAGNOSIS — E7841 Elevated Lipoprotein(a): Secondary | ICD-10-CM | POA: Diagnosis present

## 2024-07-13 DIAGNOSIS — I7774 Dissection of vertebral artery: Secondary | ICD-10-CM | POA: Diagnosis present

## 2024-07-13 DIAGNOSIS — Z833 Family history of diabetes mellitus: Secondary | ICD-10-CM

## 2024-07-13 DIAGNOSIS — Z7982 Long term (current) use of aspirin: Secondary | ICD-10-CM

## 2024-07-13 DIAGNOSIS — D696 Thrombocytopenia, unspecified: Secondary | ICD-10-CM | POA: Diagnosis present

## 2024-07-13 DIAGNOSIS — I69398 Other sequelae of cerebral infarction: Secondary | ICD-10-CM

## 2024-07-13 DIAGNOSIS — E785 Hyperlipidemia, unspecified: Secondary | ICD-10-CM | POA: Diagnosis present

## 2024-07-13 DIAGNOSIS — I69322 Dysarthria following cerebral infarction: Secondary | ICD-10-CM

## 2024-07-13 DIAGNOSIS — I69393 Ataxia following cerebral infarction: Secondary | ICD-10-CM

## 2024-07-13 DIAGNOSIS — Z7902 Long term (current) use of antithrombotics/antiplatelets: Secondary | ICD-10-CM

## 2024-07-13 DIAGNOSIS — F332 Major depressive disorder, recurrent severe without psychotic features: Secondary | ICD-10-CM

## 2024-07-13 DIAGNOSIS — Z79899 Other long term (current) drug therapy: Secondary | ICD-10-CM

## 2024-07-13 DIAGNOSIS — I69354 Hemiplegia and hemiparesis following cerebral infarction affecting left non-dominant side: Principal | ICD-10-CM

## 2024-07-13 DIAGNOSIS — R278 Other lack of coordination: Secondary | ICD-10-CM | POA: Diagnosis present

## 2024-07-13 DIAGNOSIS — Q2112 Patent foramen ovale: Secondary | ICD-10-CM

## 2024-07-13 DIAGNOSIS — E559 Vitamin D deficiency, unspecified: Secondary | ICD-10-CM | POA: Diagnosis present

## 2024-07-13 DIAGNOSIS — R001 Bradycardia, unspecified: Secondary | ICD-10-CM | POA: Diagnosis present

## 2024-07-13 DIAGNOSIS — R718 Other abnormality of red blood cells: Secondary | ICD-10-CM | POA: Diagnosis present

## 2024-07-13 DIAGNOSIS — R7401 Elevation of levels of liver transaminase levels: Secondary | ICD-10-CM | POA: Diagnosis present

## 2024-07-13 DIAGNOSIS — Z8249 Family history of ischemic heart disease and other diseases of the circulatory system: Secondary | ICD-10-CM

## 2024-07-13 DIAGNOSIS — I639 Cerebral infarction, unspecified: Principal | ICD-10-CM | POA: Diagnosis present

## 2024-07-13 DIAGNOSIS — F909 Attention-deficit hyperactivity disorder, unspecified type: Secondary | ICD-10-CM | POA: Diagnosis present

## 2024-07-13 DIAGNOSIS — K5900 Constipation, unspecified: Secondary | ICD-10-CM | POA: Diagnosis not present

## 2024-07-13 DIAGNOSIS — F331 Major depressive disorder, recurrent, moderate: Secondary | ICD-10-CM | POA: Diagnosis present

## 2024-07-13 DIAGNOSIS — Z5971 Insufficient health insurance coverage: Secondary | ICD-10-CM

## 2024-07-13 LAB — CBC
HCT: 36.2 % (ref 36.0–46.0)
Hemoglobin: 12.3 g/dL (ref 12.0–15.0)
MCH: 26.1 pg (ref 26.0–34.0)
MCHC: 34 g/dL (ref 30.0–36.0)
MCV: 76.7 fL — ABNORMAL LOW (ref 80.0–100.0)
Platelets: 116 K/uL — ABNORMAL LOW (ref 150–400)
RBC: 4.72 MIL/uL (ref 3.87–5.11)
RDW: 14.6 % (ref 11.5–15.5)
WBC: 5.9 K/uL (ref 4.0–10.5)
nRBC: 0 % (ref 0.0–0.2)

## 2024-07-13 LAB — HEPARIN LEVEL (UNFRACTIONATED): Heparin Unfractionated: 0.53 [IU]/mL (ref 0.30–0.70)

## 2024-07-13 MED ORDER — ASPIRIN 81 MG PO TBEC: 81.0000 mg | DELAYED_RELEASE_TABLET | Freq: Every day | ORAL | Status: AC

## 2024-07-13 MED ORDER — ROSUVASTATIN CALCIUM 20 MG PO TABS
20.0000 mg | ORAL_TABLET | Freq: Every day | ORAL | Status: DC
  Administered 2024-07-14 – 2024-07-20 (×7): 20 mg via ORAL
  Filled 2024-07-13 (×4): qty 1
  Filled 2024-07-13: qty 4
  Filled 2024-07-13 (×3): qty 1

## 2024-07-13 MED ORDER — CLOPIDOGREL BISULFATE 75 MG PO TABS
75.0000 mg | ORAL_TABLET | Freq: Every day | ORAL | Status: DC
  Administered 2024-07-14 – 2024-08-03 (×21): 75 mg via ORAL
  Filled 2024-07-13 (×21): qty 1

## 2024-07-13 MED ORDER — CITALOPRAM HYDROBROMIDE 20 MG PO TABS: 20.0000 mg | ORAL_TABLET | Freq: Every day | ORAL | Status: DC

## 2024-07-13 MED ORDER — CITALOPRAM HYDROBROMIDE 20 MG PO TABS
20.0000 mg | ORAL_TABLET | Freq: Every day | ORAL | Status: DC
  Administered 2024-07-14 – 2024-08-03 (×21): 20 mg via ORAL
  Filled 2024-07-13 (×21): qty 1

## 2024-07-13 MED ORDER — THIAMINE MONONITRATE 100 MG PO TABS
100.0000 mg | ORAL_TABLET | Freq: Every day | ORAL | Status: DC
  Administered 2024-07-14 – 2024-07-25 (×12): 100 mg via ORAL
  Filled 2024-07-13 (×12): qty 1

## 2024-07-13 MED ORDER — ASPIRIN 81 MG PO TBEC
81.0000 mg | DELAYED_RELEASE_TABLET | Freq: Every day | ORAL | Status: DC
  Administered 2024-07-14 – 2024-08-03 (×21): 81 mg via ORAL
  Filled 2024-07-13 (×21): qty 1

## 2024-07-13 MED ORDER — POLYETHYLENE GLYCOL 3350 17 G PO PACK
17.0000 g | PACK | Freq: Two times a day (BID) | ORAL | Status: DC
  Administered 2024-07-13 – 2024-07-25 (×20): 17 g via ORAL
  Filled 2024-07-13 (×21): qty 1

## 2024-07-13 MED ORDER — BUTALBITAL-APAP-CAFFEINE 50-325-40 MG PO TABS: 1.0000 | ORAL_TABLET | Freq: Three times a day (TID) | ORAL | Status: DC | PRN

## 2024-07-13 MED ORDER — ROSUVASTATIN CALCIUM 20 MG PO TABS: 20.0000 mg | ORAL_TABLET | Freq: Every day | ORAL | Status: DC

## 2024-07-13 MED ORDER — OXYCODONE HCL 5 MG PO TABS
5.0000 mg | ORAL_TABLET | ORAL | Status: DC | PRN
Start: 2024-07-13 — End: 2024-08-03

## 2024-07-13 MED ORDER — FERROUS SULFATE 325 (65 FE) MG PO TABS
325.0000 mg | ORAL_TABLET | ORAL | Status: DC
  Administered 2024-07-14 – 2024-07-24 (×6): 325 mg via ORAL
  Filled 2024-07-13 (×4): qty 1

## 2024-07-13 MED ORDER — CLOPIDOGREL BISULFATE 75 MG PO TABS: 75.0000 mg | ORAL_TABLET | Freq: Every day | ORAL | 0 refills | Status: DC

## 2024-07-13 MED ORDER — ORAL CARE MOUTH RINSE: 15.0000 mL | OROMUCOSAL | Status: DC | PRN

## 2024-07-13 MED ORDER — ACETAMINOPHEN 325 MG PO TABS
650.0000 mg | ORAL_TABLET | Freq: Four times a day (QID) | ORAL | Status: DC | PRN
  Administered 2024-07-14: 650 mg via ORAL
  Filled 2024-07-13: qty 2

## 2024-07-13 MED ORDER — FERROUS SULFATE 325 (65 FE) MG PO TABS: 325.0000 mg | ORAL_TABLET | ORAL | Status: DC

## 2024-07-13 NOTE — Discharge Summary (Signed)
 Physician Discharge Summary  Cassidy Bruce FMW:[id number] DOB: December 14, 1995 DOA: 07/05/2024  PCP: Pcp, No  Admit date: 07/05/2024 Discharge date: 07/13/2024  Time spent: 37 minutes  Recommendations for Outpatient Follow-up:  Plavix  X 90 days then stop-aspirin  lifelong Needs outpatient referral for consideration of PFO closure Outpatient follow-up with neurology and needs consideration for outpatient PFO closure Needs outpatient follow-up with gynecology for contraceptive management as Nexplanon  was discontinued on 9/6  Discharge Diagnoses:  MAIN problem for hospitalization   Acute stroke  Please see below for itemized issues addressed in HOpsital- refer to other progress notes for clarity if needed  Discharge Condition: Improved  Diet recommendation: Regular  Filed Weights   07/11/24 1400  Weight: 65.8 kg    History of present illness:  28 year old female ADHD depression presented with seizure activity, dizziness slurred speech and headache with imbalance and walking she awoke from sleep with headache And had acute onset vertigo on 9/2 She went to the ED on 9/2 and was found to have bilateral cerebellar strokes left more than right and was admitted for further workup TEE showed PFO and transcranial Dopplers with PFO showed [name] grade 2 lower end of scale and higher end of scale with Valsalva Lower extremity Dopplers were negative for DVT EF was 60-65% LDL was 104 A1c was 5.4 ANA negative, cardiolipin ab negative, serum homecysteine wnl, beta 2 glycoprotein ab wnl, lupus anticoagulant not detected. vitamin b1 wnl.  Hospitalization complicated by dysphagia on 9/7 with left-sided weakness at the same time and repeat MRI showed evolving subacute bilateral cerebellar infarcts with possible mild expansion versus wallerian degeneration--- heparin  was started IV as a drip for 48 hours and patient was stabilized for discharge from perspective of neurology subsequently to continue DAPT for  90 days and then aspirin  alone Psychiatry saw her for her depression and Celexa  was started and can be titrated in the outpatient setting Nexplanon  was DC'd on 9/6 She had some mild NSVT which seem to resolve and mild kidney injury also which resolved She should have iron studies in the near future given her microcytic anemia   Discharge Exam: Vitals:   07/12/24 2344 07/13/24 0510  BP: 136/87 128/72  Pulse: (!) 48 (!) 49  Resp: 18 18  Temp: 98.2 F (36.8 C) 98 F (36.7 C)  SpO2: 100% 100%    Subj on day of d/c   Awake coherent no distress dysarthric speech  General Exam on discharge  Weaker in left hand able to raise but unable to really do much with it Speech is halting Power is 5/5 otherwise overall She is dysarthric S1-S2 no murmur ROM intact  Discharge Instructions   Discharge Instructions     Ambulatory referral to Neurology   Complete by: As directed    Follow up with Dr. Rosemarie at [facility] in 4-6 weeks. Too complicated for NP to follow. Thanks.   Diet - low sodium heart healthy   Complete by: As directed    Increase activity slowly   Complete by: As directed       Allergies as of 07/13/2024   No Known Allergies      Medication List     STOP taking these medications    fluconazole  150 MG tablet Commonly known as: Diflucan        TAKE these medications    acetaminophen  500 MG tablet Commonly known as: TYLENOL  Take 500 mg by mouth every 6 (six) hours as needed.   aspirin  EC 81 MG tablet Take 1 tablet (  81 mg total) by mouth daily. Swallow whole. Start taking on: July 14, 2024   citalopram  20 MG tablet Commonly known as: CELEXA  Take 1 tablet (20 mg total) by mouth daily. Start taking on: July 14, 2024   clopidogrel  75 MG tablet Commonly known as: PLAVIX  Take 1 tablet (75 mg total) by mouth daily. Start taking on: July 14, 2024   ferrous sulfate  325 (65 FE) MG tablet Take 1 tablet (325 mg total) by mouth every other  day. Start taking on: July 14, 2024   rosuvastatin  20 MG tablet Commonly known as: CRESTOR  Take 1 tablet (20 mg total) by mouth daily. Start taking on: July 14, 2024       No Known Allergies  Follow-up Information     Rosemarie Eather RAMAN, MD. Schedule an appointment as soon as possible for a visit in 1 month(s).   Specialties: Neurology, Radiology Why: stroke clinic Contact information: [address] [place] [address] [phone]                  The results of significant diagnostics from this hospitalization (including imaging, microbiology, ancillary and laboratory) are listed below for reference.    Significant Diagnostic Studies: DG Swallowing Func-Speech Pathology Result Date: 07/12/2024 Table formatting from the original result was not included. Modified Barium Swallow Study Patient Details Name: Cassidy Bruce MRN: [id number] Date of Birth: [birth year] Today's Date: 07/12/2024 HPI/PMH: HPI: Pt is a 28 yo female presenting to [facility] on 07/05/24 for dizziness, slurred speech, and impaired balance. MRI showed bilat cerebellar infarct with L>R. CTA head/neck showed proximal L vertebral artery dissection. Pt initially passed a swallow screen but was observed to have difficulty swallowing 9/7 and repeat swallow screen was not passed. PMH of ADHD, MDD. Clinical Impression: Clinical Impression: Pt presents with mild dysphagia. Oral phase fluctuates, at times occurring swiftly and with swift, complete posterior propulsion. Other times, her lingual transit is slow and she has more oral residue, although even when she only swallows a little bit of the bolus on the initial swallow, she uses additional swallows (usually 1-2) to effectively clear her oral cavity. There was a single instance of trace, transient aspiration on the initial spoonful of thin liquid (PAS 6, cleared with a spontaneous throat clear) that seemed to be more a result of reduced oral control, as  pharyngeal phase otherwise was [facility] across the study. Recommend resuming regular solids and thin liquids. Factors that may increase risk of adverse event in presence of aspiration Noe & Lianne 2021): Factors that may increase risk of adverse event in presence of aspiration Noe & Lianne 2021): Reduced cognitive function Recommendations/Plan: Swallowing Evaluation Recommendations Swallowing Evaluation Recommendations Recommendations: PO diet PO Diet Recommendation: Regular; Thin liquids (Level 0) Liquid Administration via: Cup; Straw Medication Administration: Whole meds with liquid Supervision: Patient able to self-feed; Intermittent supervision/cueing for swallowing strategies Swallowing strategies  : Slow rate; Small bites/sips Postural changes: Position pt fully upright for meals Oral care recommendations: Oral care BID (2x/day) Treatment Plan Treatment Plan Treatment recommendations: Therapy as outlined in treatment plan below Follow-up recommendations: Acute inpatient rehab (3 hours/day) Functional status assessment: Patient has had a recent decline in their functional status and demonstrates the ability to make significant improvements in function in a reasonable and predictable amount of time. Treatment frequency: Min 2x/week Treatment duration: 2 weeks Interventions: Aspiration precaution training; Patient/family education; Diet toleration management by SLP Recommendations Recommendations for follow up therapy are one component of a multi-disciplinary discharge planning  process, led by the attending physician.  Recommendations may be updated based on patient status, additional functional criteria and insurance authorization. Assessment: Orofacial Exam: Orofacial Exam Oral Cavity: Oral Hygiene: WFL Oral Cavity - Dentition: Adequate natural dentition Orofacial Anatomy: WFL Oral Motor/Sensory Function: WFL Anatomy: Anatomy: WFL Boluses Administered: Boluses Administered Boluses Administered: Thin  liquids (Level 0); Mildly thick liquids (Level 2, nectar thick); Puree; Solid  Oral Impairment Domain: Oral Impairment Domain Lip Closure: No labial escape Tongue control during bolus hold: Escape to lateral buccal cavity/floor of mouth Bolus preparation/mastication: Slow prolonged chewing/mashing with complete recollection Bolus transport/lingual motion: Slow tongue motion Oral residue: Majority of bolus remaining (piecemeal) Location of oral residue : Tongue Initiation of pharyngeal swallow : Valleculae  Pharyngeal Impairment Domain: Pharyngeal Impairment Domain Soft palate elevation: No bolus between soft palate (SP)/pharyngeal wall (PW) Laryngeal elevation: Complete superior movement of thyroid cartilage with complete approximation of arytenoids to epiglottic petiole Anterior hyoid excursion: Partial anterior movement Epiglottic movement: Complete inversion Laryngeal vestibule closure: Complete, no air/contrast in laryngeal vestibule Pharyngeal stripping wave : Present - complete Pharyngeal contraction (A/P view only): N/A Pharyngoesophageal segment opening: Complete distension and complete duration, no obstruction of flow Tongue base retraction: No contrast between tongue base and posterior pharyngeal wall (PPW) Pharyngeal residue: Complete pharyngeal clearance Location of pharyngeal residue: N/A  Esophageal Impairment Domain: Esophageal Impairment Domain Esophageal clearance upright position: Complete clearance, esophageal coating Pill: Pill Consistency administered: Thin liquids (Level 0) Thin liquids (Level 0): [facility] Penetration/Aspiration Scale Score: Penetration/Aspiration Scale Score 1.  Material does not enter airway: Mildly thick liquids (Level 2, nectar thick); Puree; Solid; Pill 6.  Material enters airway, passes BELOW cords then ejected out: Thin liquids (Level 0) (x1 on initial spoonful of thin liquid) Compensatory Strategies: Compensatory Strategies Compensatory strategies: No   General Information:  Caregiver present: No  Diet Prior to this Study: NPO   Temperature : Normal   Respiratory Status: WFL   Supplemental O2: None (Room air)   History of Recent Intubation: No  Behavior/Cognition: Alert; Cooperative; Requires cueing Self-Feeding Abilities: Able to self-feed Baseline vocal quality/speech: Other (comment) (monotone) Volitional Cough: Able to elicit Volitional Swallow: Able to elicit Exam Limitations: No limitations Goal Planning: Prognosis for improved oropharyngeal function: Good Barriers to Reach Goals: Cognitive deficits No data recorded Patient/Family Stated Goal: none stated Consulted and agree with results and recommendations: Patient Pain: Pain Assessment Pain Assessment: Faces Faces Pain Scale: 0 End of Session: Start Time:SLP Start Time (ACUTE ONLY): 1016 Stop Time: SLP Stop Time (ACUTE ONLY): 1030 Time Calculation:SLP Time Calculation (min) (ACUTE ONLY): 14 min Charges: SLP Evaluations $ SLP Speech Visit: 1 Visit SLP Evaluations $BSS Swallow: 1 Procedure $MBS Swallow: 1 Procedure $Speech Treatment for Individual: 1 Procedure SLP visit diagnosis: SLP Visit Diagnosis: Dysphagia, oral phase (R13.11) Past Medical History: Past Medical History: Diagnosis Date  ADHD   Depression  Past Surgical History: Past Surgical History: Procedure Laterality Date  TRANSESOPHAGEAL ECHOCARDIOGRAM (CATH LAB) N/A 07/07/2024  Procedure: TRANSESOPHAGEAL ECHOCARDIOGRAM;  Surgeon: Lonni Slain, MD;  Location: [facility] INVASIVE CV LAB;  Service: Cardiovascular;  Laterality: N/A; Leita SAILOR., M.A. CCC-SLP Acute Rehabilitation Services Office: [phone] Secure chat preferred 07/12/2024, 11:21 AM  MR BRAIN WO CONTRAST Result Date: 07/11/2024 CLINICAL DATA:  Follow-up examination for stroke EXAM: MRI HEAD WITHOUT CONTRAST TECHNIQUE: Multiplanar, multiecho pulse sequences of the brain and surrounding structures were obtained without intravenous contrast. COMPARISON:  Prior MRI from 07/06/2024 FINDINGS: Brain: Previously  identified bilateral cerebellar infarcts again seen, primarily involving the SCA  distributions, left slightly worse than right, stable. Possible further extension versus wallerian degeneration seen involving the midbrain since previous (series 2, images 19, 20). Otherwise, overall distribution is relatively stable. Associated susceptibility artifact consistent with petechial hemorrhage. Superimposed intrinsic T1 hyperintensity at the right greater than left cerebellum. No frank hyperdense blood products or intraparenchymal hematoma seen on CT from earlier the same day. Otherwise, remainder the brain remains normal in appearance. No other evidence for acute or subacute ischemia. No other areas of chronic cortical infarction. No other acute or chronic intracranial blood products. No mass lesion or midline shift. No hydrocephalus or extra-axial fluid collection. Basilar cisterns remain patent. Pituitary gland suprasellar region within normal limits. Vascular: Loss of normal flow void within the left vertebral artery, consistent with previously identified dissection. Major intracranial vascular flow voids are otherwise maintained. Skull and upper cervical spine: Craniocervical junction within normal limits. Bone marrow signal intensity felt to be within normal limits for age. No scalp soft tissue abnormality. Sinuses/Orbits: Globes orbital soft tissues within normal limits. Paranasal sinuses are clear. No significant mastoid effusion. Other: None. IMPRESSION: 1. Evolving early subacute bilateral cerebellar infarcts, left slightly worse than right. Possible mild interval expansion versus changes of Wallerian degeneration (favored) involving the midbrain since previous. Associated petechial hemorrhage without frank hemorrhagic transformation. 2. Loss of normal flow void within the left vertebral artery, consistent with previously identified dissection. 3. Otherwise stable and normal brain MRI. Electronically Signed   By:  Morene Hoard M.D.   On: 07/11/2024 19:30   CT ANGIO HEAD NECK W WO CM Result Date: 07/11/2024 EXAM: CTA HEAD AND NECK WITH AND WITHOUT 07/11/2024 12:58:50 PM TECHNIQUE: CTA of the head and neck was performed with and without the administration of intravenous contrast. Multiplanar 2D and/or 3D reformatted images are provided for review. Automated exposure control, iterative reconstruction, and/or weight based adjustment of the mA/kV was utilized to reduce the radiation dose to as low as reasonably achievable. Stenosis of the internal carotid arteries measured using NASCET criteria. COMPARISON: MRI head 07/06/24 and CTA head and neck and CT head 07/05/24 CLINICAL HISTORY: Neuro deficit, acute, stroke suspected. FINDINGS: CTA NECK: AORTIC ARCH AND ARCH VESSELS: No dissection or arterial injury. No significant stenosis of the brachiocephalic or subclavian arteries. Common aeration of the brachiocephalic and left common carotid arteries. CERVICAL CAROTID ARTERIES: No dissection, arterial injury, or hemodynamically significant stenosis by NASCET criteria. CERVICAL VERTEBRAL ARTERIES: The right vertebral artery is patent from the origin to the vertebrobasilar confluence. Redemonstrated left vertebral artery dissection. There is occlusion of the proximal left V1 segment. Reconstitution along the distal V2 segment is similar to prior. There is increased irregularity of the left V3 segment with decreased vessel caliber. The left V4 segment also appears slightly decreased in caliber. LUNGS AND MEDIASTINUM: Unremarkable. SOFT TISSUES: No acute abnormality. BONES: No acute abnormality. CTA HEAD: ANTERIOR CIRCULATION: No significant stenosis of the internal carotid arteries. No significant stenosis of the anterior cerebral arteries. No significant stenosis of the middle cerebral arteries. No aneurysm. POSTERIOR CIRCULATION: The basilar artery is patent. The posterior cerebral arteries are patent bilaterally. The superior  cerebellar arteries are patent bilaterally. Small posterior communicating arteries visualized bilaterally. The posterior inferior cerebellar arteries are visualized bilaterally. OTHER: Evolving bilateral cerebellar infarcts, particularly within the superior cerebral artery territories, with associated mass effect. There is partial effacement of the fourth ventricle. There is no CT evidence of large territory infarct within the supratentorial parenchyma. Question subtle increase in caliber of the lateral ventricles and  third ventricle compared to prior without frank hydrocephalus on the current study. There is no midline shift. No acute intracranial hemorrhage. No dural venous sinus thrombosis on this non-dedicated study. IMPRESSION: 1. Evolving bilateral cerebellar infarcts, particularly within the superior cerebellar artery territories, with associated mass effect and partial effacement of the fourth ventricle. 2. No acute intracranial hemorrhage. 3. Redemonstrated left vertebral artery dissection with occlusion of the proximal left V1 segment and reconstitution along the distal V2 segment. Increased irregularity of the left V3 segment with decreased vessel caliber, and slightly decreased caliber of the left V4 segment. 4. Anterior circulation is unremarkable. 5. Findings discussed with Dr. Vanessa at 1:24PM on 07/11/24. Electronically signed by: Donnice Mania MD 07/11/2024 01:48 PM EDT RP Workstation: [id number]   VAS US  TRANSCRANIAL DOPPLER W BUBBLES Result Date: 07/08/2024  Transcranial Doppler with Bubble Patient Name:  Cassidy Bruce  Date of Exam:   07/08/2024 Medical Rec #: [id number]       Accession #:    [id number] Date of Birth: 05-12-1996      Patient Gender: F Patient Age:   36 years Exam Location:  [facility] Procedure:      VAS US  TRANSCRANIAL DOPPLER W BUBBLES Referring Phys: BRIDGETTE CHRISTOPHER --------------------------------------------------------------------------------  Indications:  Stroke. History: TEE: PFO seen by color Doppler (left to right shunt). Agitated saline at rest was late positive for shunt, but agitated saline with abdominal pressure (Valsalva) was rapidly positive, consistent with right to left intra-atrial shunt.  Comparison Study: No prior study on file Performing Technologist: Alberta Lis RVS  Examination Guidelines: A complete evaluation includes B-mode imaging, spectral Doppler, color Doppler, and power Doppler as needed of all accessible portions of each vessel. Bilateral testing is considered an integral part of a complete examination. Limited examinations for reoccurring indications may be performed as noted.  Summary:  A vascular evaluation was performed. The right middle cerebral artery was studied. An IV was inserted into the patient's left forearm. Verbal informed consent was obtained.  TCD bubble study indicative of PFO [name] Grade III lower end of scale at rest, and higher end of scale with Valsalva maneuver.   Diagnosing physician: Ary Cummins MD Electronically signed by Ary Cummins MD on 07/08/2024 at 6:10:40 PM.    Final    ECHO TEE Result Date: 07/07/2024    TRANSESOPHOGEAL ECHO REPORT   Patient Name:   Cassidy Bruce Date of Exam: 07/07/2024 Medical Rec #:  [id number]      Height:       65.0 in Accession #:    [id number]     Weight:       140.0 lb Date of Birth:  08-09-1996     BSA:          1.700 m Patient Age:    27 years       BP:           128/80 mmHg Patient Gender: F              HR:           111 bpm. Exam Location:  Inpatient Procedure: Transesophageal Echo, 3D Echo, Cardiac Doppler, Color Doppler and            Saline Contrast Bubble Study (Both Spectral and Color Flow Doppler            were utilized during procedure). Indications:     Stroke i63.9  History:         Patient has prior history of  Echocardiogram examinations, most                  recent 07/06/2024.  Sonographer:     Damien Senior RDCS Referring Phys:  [id number] HAO MENG Diagnosing Phys:  Shelda Bruckner MD PROCEDURE: After discussion of the risks and benefits of a TEE, an informed consent was obtained from the patient. The transesophogeal probe was passed without difficulty through the esophogus of the patient. Sedation performed by different physician. The patient was monitored while under deep sedation. Anesthestetic sedation was provided intravenously by Anesthesiology: 380mg  of Propofol , 40mg  of Lidocaine . Image quality was good. The patient developed no complications during the procedure.  IMPRESSIONS  1. Left ventricular ejection fraction, by estimation, is 60 to 65%. The left ventricle has normal function.  2. Right ventricular systolic function is normal. The right ventricular size is normal.  3. No left atrial/left atrial appendage thrombus was detected. The LAA emptying velocity was 64 cm/s.  4. Distal anterior mitral leaflet tip appears to have minimal prolapse of fibrinous appearing structures. The mitral valve is abnormal. Trivial mitral valve regurgitation. No evidence of mitral stenosis.  5. The aortic valve is tricuspid. Aortic valve regurgitation is not visualized. No aortic stenosis is present.  6. Evidence of atrial level shunting detected by color flow Doppler. Agitated saline contrast bubble study was positive with shunting observed within 3-6 cardiac cycles suggestive of interatrial shunt. There is a small patent foramen ovale with bidirectional shunting across atrial septum.  7. 3D performed of the mitral valve and 3D performed of the atrial septum and demonstrates Distal anterior mitral leaflet tip appears to have minimal prolapse of fibrinous appearing structures. PFO visualized. Conclusion(s)/Recommendation(s): PFO seen by color Doppler (left to right shunt). Agitated saline at rest was late positive for shunt, but agitated saline with abdominal pressure (Valsalva) was rapidly positive, consistent with right to left intra-atrial  shunt. FINDINGS  Left Ventricle: Left  ventricular ejection fraction, by estimation, is 60 to 65%. The left ventricle has normal function. The left ventricular internal cavity size was normal in size. Right Ventricle: The right ventricular size is normal. No increase in right ventricular wall thickness. Right ventricular systolic function is normal. Left Atrium: Left atrial size was normal in size. No left atrial/left atrial appendage thrombus was detected. The LAA emptying velocity was 64 cm/s. Right Atrium: Right atrial size was normal in size. Prominent Chiari network. Pericardium: There is no evidence of pericardial effusion. Mitral Valve: Distal anterior mitral leaflet tip appears to have minimal prolapse of fibrinous appearing structures. The mitral valve is abnormal. Trivial mitral valve regurgitation. No evidence of mitral valve stenosis. There is no evidence of mitral valve vegetation. Tricuspid Valve: The tricuspid valve is normal in structure. Tricuspid valve regurgitation is mild . No evidence of tricuspid stenosis. There is no evidence of tricuspid valve vegetation. Aortic Valve: The aortic valve is tricuspid. Aortic valve regurgitation is not visualized. No aortic stenosis is present. There is no evidence of aortic valve vegetation. Pulmonic Valve: The pulmonic valve was normal in structure. Pulmonic valve regurgitation is trivial. There is no evidence of pulmonic valve vegetation. Aorta: The aortic root and ascending aorta are structurally normal, with no evidence of dilitation. IAS/Shunts: Evidence of atrial level shunting detected by color flow Doppler. Agitated saline contrast was given intravenously to evaluate for intracardiac shunting. Agitated saline contrast bubble study was positive with shunting observed within 3-6 cardiac cycles suggestive of interatrial shunt. There is no evidence of a patent foramen ovale.  A small patent foramen ovale is detected with bidirectional shunting across atrial septum. Additional Comments: Spectral  Doppler performed. Shelda Bruckner MD Electronically signed by Shelda Bruckner MD Signature Date/Time: 07/07/2024/6:59:37 PM    Final    VAS US  LOWER EXTREMITY VENOUS (DVT) Result Date: 07/07/2024  Lower Venous DVT Study Patient Name:  Cassidy Bruce  Date of Exam:   07/07/2024 Medical Rec #: [id number]       Accession #:    [id number] Date of Birth: [birth year]      Patient Gender: F Patient Age:   29 years Exam [facility]:  [facility] Procedure:      VAS US  LOWER EXTREMITY VENOUS (DVT) Referring Phys: ARY XU --------------------------------------------------------------------------------  Indications: Stroke.  Risk Factors: None identified. Comparison Study: No prior studies. Performing Technologist: Cordella Bruce RVT  Examination Guidelines: A complete evaluation includes B-mode imaging, spectral Doppler, color Doppler, and power Doppler as needed of all accessible portions of each vessel. Bilateral testing is considered an integral part of a complete examination. Limited examinations for reoccurring indications may be performed as noted. The reflux portion of the exam is performed with the patient in reverse Trendelenburg.  +---------+---------------+---------+-----------+----------+--------------+ RIGHT    CompressibilityPhasicitySpontaneityPropertiesThrombus Aging +---------+---------------+---------+-----------+----------+--------------+ CFV      Full           Yes      Yes                                 +---------+---------------+---------+-----------+----------+--------------+ SFJ      Full                                                        +---------+---------------+---------+-----------+----------+--------------+ FV Prox  Full                                                        +---------+---------------+---------+-----------+----------+--------------+ FV Mid   Full                                                         +---------+---------------+---------+-----------+----------+--------------+ FV DistalFull                                                        +---------+---------------+---------+-----------+----------+--------------+ PFV      Full                                                        +---------+---------------+---------+-----------+----------+--------------+ POP      Full           Yes      Yes                                 +---------+---------------+---------+-----------+----------+--------------+  PTV      Full                                                        +---------+---------------+---------+-----------+----------+--------------+ PERO     Full                                                        +---------+---------------+---------+-----------+----------+--------------+   +---------+---------------+---------+-----------+----------+--------------+ LEFT     CompressibilityPhasicitySpontaneityPropertiesThrombus Aging +---------+---------------+---------+-----------+----------+--------------+ CFV      Full           Yes      Yes                                 +---------+---------------+---------+-----------+----------+--------------+ SFJ      Full                                                        +---------+---------------+---------+-----------+----------+--------------+ FV Prox  Full                                                        +---------+---------------+---------+-----------+----------+--------------+ FV Mid   Full                                                        +---------+---------------+---------+-----------+----------+--------------+ FV DistalFull                                                        +---------+---------------+---------+-----------+----------+--------------+ PFV      Full                                                         +---------+---------------+---------+-----------+----------+--------------+ POP      Full           Yes      Yes                                 +---------+---------------+---------+-----------+----------+--------------+ PTV      Full                                                        +---------+---------------+---------+-----------+----------+--------------+  PERO     Full                                                        +---------+---------------+---------+-----------+----------+--------------+     Summary: RIGHT: - There is no evidence of deep vein thrombosis in the lower extremity.  - No cystic structure found in the popliteal fossa.  LEFT: - There is no evidence of deep vein thrombosis in the lower extremity.  - No cystic structure found in the popliteal fossa.  *See table(s) above for measurements and observations. Electronically signed by Lonni Gaskins MD on 07/07/2024 at 5:40:37 PM.    Final    EP STUDY Result Date: 07/07/2024 See surgical note for result.  EEG adult Result Date: 07/07/2024 Shelton Arlin KIDD, MD     07/07/2024  8:49 AM Patient Name: Cassidy Bruce MRN: [id number] Epilepsy Attending: Arlin KIDD Shelton Referring Physician/Provider: Doutova, Anastassia, MD Date: 07/07/2024 Duration: 23.24 mins Patient history: 28yo F with seizure like activity. EEG to evaluate for seizure Level of alertness: Awake AEDs during EEG study: None Technical aspects: This EEG study was done with scalp electrodes positioned according to the 10-20 International system of electrode placement. Electrical activity was reviewed with band pass filter of 1-70Hz , sensitivity of 7 uV/mm, display speed of 42mm/sec with a 60Hz  notched filter applied as appropriate. EEG data were recorded continuously and digitally stored.  Video monitoring was available and reviewed as appropriate. Description: The posterior dominant rhythm consists of 8-9 Hz activity of moderate voltage (25-35 uV) seen predominantly  in posterior head regions, symmetric and reactive to eye opening and eye closing. Hyperventilation and photic stimulation were not performed.   IMPRESSION: This study is within normal limits. No seizures or epileptiform discharges were seen throughout the recording. A normal interictal EEG does not exclude the diagnosis of epilepsy. Arlin KIDD Shelton   MR BRAIN WO CONTRAST Result Date: 07/06/2024 CLINICAL DATA:  Provided history: Stroke, follow-up. EXAM: MRI HEAD WITHOUT CONTRAST TECHNIQUE: Multiplanar, multiecho pulse sequences of the brain and surrounding structures were obtained without intravenous contrast. COMPARISON:  Non-contrast head CT and CT angiogram head/neck 07/05/2024. FINDINGS: Brain: Cerebral volume is normal. Acute infarcts within cerebellar vermis and bilateral cerebellar hemispheres. Most notably, large acute infarcts are present within the superior cerebellar artery territories bilaterally. Posterior fossa mass effect without cerebellar tonsillar herniation or evidence of obstructive hydrocephalus. Petechial hemorrhage within the cerebellar vermis and superior right cerebellar hemisphere. No evidence of an intracranial mass. No extra-axial fluid collection. No midline shift. Vascular: Please refer to the CTA head/neck performed yesterday. Skull and upper cervical spine: No focal worrisome marrow lesion. Sinuses/Orbits: No mass or acute finding within the imaged orbits. No significant paranasal sinus disease These results will be called to the ordering clinician or representative by the Radiologist Assistant, and communication documented in the PACS or [employer]. IMPRESSION: Acute infarcts within the cerebellar vermis and bilateral cerebellar hemispheres. Most notably, large acute infarcts are present within the superior cerebellar artery territories bilaterally. Posterior fossa mass effect without cerebellar tonsillar herniation or evidence of obstructive hydrocephalus. Petechial  hemorrhage within the cerebellar vermis and superior right cerebellar hemisphere. Electronically Signed   By: Rockey Childs D.O.   On: 07/06/2024 18:29   ECHOCARDIOGRAM COMPLETE Result Date: 07/06/2024    ECHOCARDIOGRAM REPORT   Patient Name:  Cassidy Bruce Date of Exam: 07/06/2024 Medical Rec #:  [id number]      Height:       65.0 in Accession #:    [id number]     Weight:       140.0 lb Date of Birth:  [birth year]     BSA:          1.700 m Patient Age:    27 years       BP:           120/79 mmHg Patient Gender: F              HR:           77 bpm. Exam Location:  Inpatient Procedure: 2D Echo, Color Doppler and Cardiac Doppler (Both Spectral and Color            Flow Doppler were utilized during procedure). Indications:   Stroke i63.9  History:       Patient has no prior history of Echocardiogram examinations.                Stroke.  Sonographer:   Koleen Popper RDCS Referring      [phone] ANASTASSIA DOUTOVA Phys:  Sonographer Comments: Image acquisition challenging due to uncooperative patient. IMPRESSIONS  1. Left ventricular ejection fraction, by estimation, is 60 to 65%. The left ventricle has normal function. The left ventricle has no regional wall motion abnormalities. Left ventricular diastolic parameters were normal.  2. Right ventricular systolic function is normal. The right ventricular size is normal. There is normal pulmonary artery systolic pressure.  3. The mitral valve is normal in structure. Mild mitral valve regurgitation. No evidence of mitral stenosis.  4. The aortic valve is tricuspid. Aortic valve regurgitation is not visualized. visually opens well but gradient not assessed.  5. The inferior vena cava is normal in size with greater than 50% respiratory variability, suggesting right atrial pressure of 3 mmHg. FINDINGS  Left Ventricle: Left ventricular ejection fraction, by estimation, is 60 to 65%. The left ventricle has normal function. The left ventricle has no regional wall motion  abnormalities. The left ventricular internal cavity size was normal in size. There is  no left ventricular hypertrophy. Left ventricular diastolic parameters were normal. Right Ventricle: The right ventricular size is normal. No increase in right ventricular wall thickness. Right ventricular systolic function is normal. There is normal pulmonary artery systolic pressure. The tricuspid regurgitant velocity is 2.06 m/s, and  with an assumed right atrial pressure of 3 mmHg, the estimated right ventricular systolic pressure is 20.0 mmHg. Left Atrium: Left atrial size was normal in size. Right Atrium: Right atrial size was normal in size. Pericardium: There is no evidence of pericardial effusion. Mitral Valve: The mitral valve is normal in structure. Mild mitral valve regurgitation. No evidence of mitral valve stenosis. Tricuspid Valve: The tricuspid valve is normal in structure. Tricuspid valve regurgitation is mild . No evidence of tricuspid stenosis. Aortic Valve: The aortic valve is tricuspid. Aortic valve regurgitation is not visualized. Visually opens well but gradient not assessed. Pulmonic Valve: The pulmonic valve was normal in structure. Pulmonic valve regurgitation is trivial. No evidence of pulmonic stenosis. Aorta: The aortic root is normal in size and structure. Venous: The inferior vena cava is normal in size with greater than 50% respiratory variability, suggesting right atrial pressure of 3 mmHg. IAS/Shunts: No atrial level shunt detected by color flow Doppler.  LEFT VENTRICLE PLAX 2D LVIDd:  5.00 cm   Diastology LVIDs:         3.60 cm   LV e' medial:    11.60 cm/s LV PW:         0.70 cm   LV E/e' medial:  6.2 LV IVS:        0.70 cm   LV e' lateral:   13.20 cm/s LVOT diam:     1.90 cm   LV E/e' lateral: 5.5 LV SV:         63 LV SV Index:   37 LVOT Area:     2.84 cm  RIGHT VENTRICLE             IVC RV S prime:     17.30 cm/s  IVC diam: 1.90 cm TAPSE (M-mode): 2.9 cm LEFT ATRIUM             Index         RIGHT ATRIUM           Index LA diam:        3.10 cm 1.82 cm/m   RA Area:     11.60 cm LA Vol (A2C):   44.9 ml 26.41 ml/m  RA Volume:   25.10 ml  14.77 ml/m LA Vol (A4C):   20.6 ml 12.12 ml/m LA Biplane Vol: 30.0 ml 17.65 ml/m  AORTIC VALVE             PULMONIC VALVE LVOT Vmax:   115.00 cm/s PR End Diast Vel: 1.51 msec LVOT Vmean:  73.300 cm/s LVOT VTI:    0.221 m  AORTA Ao Root diam: 2.80 cm Ao Asc diam:  2.70 cm MITRAL VALVE               TRICUSPID VALVE MV Area (PHT): 3.68 cm    TR Peak grad:   17.0 mmHg MV Decel Time: 206 msec    TR Vmax:        206.00 cm/s MR Peak grad: 24.2 mmHg MR Vmax:      246.00 cm/s  SHUNTS MV E velocity: 72.40 cm/s  Systemic VTI:  0.22 m MV A velocity: 86.50 cm/s  Systemic Diam: 1.90 cm MV E/A ratio:  0.84 Franck Azobou Tonleu Electronically signed by Joelle Cedars Tonleu Signature Date/Time: 07/06/2024/11:37:42 AM    Final    CT ANGIO HEAD NECK W WO CM W PERF (CODE STROKE) Result Date: 07/05/2024 EXAM: CTA Head and Neck with Perfusion 07/05/2024 08:55:08 PM TECHNIQUE: CTA of the head and neck was performed with the administration of intravenous contrast. 3D postprocessing with multiplanar reconstructions and MIPs was performed to evaluate the vascular anatomy. Automated exposure control, iterative reconstruction, and/or weight based adjustment of the mA/kV was utilized to reduce the radiation dose to as low as reasonably achievable. COMPARISON: 07/05/2024 head CT CLINICAL HISTORY: Neuro deficit, acute, stroke suspected; ataxia, slurred speech, new small cerebellar stroke on CTH. Triage notes: seizure activity stated by her roommate. Pt described as overly emotional, was rolling around and throwing her hands around, keeps writing in a diary she has. Did not hit her head, no oral injury, no loss of urine. A\T\Ox4. VS stable. FINDINGS: CTA NECK: AORTIC ARCH AND ARCH VESSELS: No dissection or arterial injury. No significant stenosis of the brachiocephalic or subclavian arteries.  CERVICAL CAROTID ARTERIES: No dissection, arterial injury, or hemodynamically significant stenosis by NASCET criteria. CERVICAL VERTEBRAL ARTERIES: No opacification of the left vertebral artery distal V1 and proximal V2 segments. There is reconstitution at the distal  V2 segment but enhancement remains asymmetrically decreased throughout the remainder of the course of the vertebral artery. The right vertebral artery is normal. No proximal occlusion visualized of the inferior cerebellar arteries. LUNGS AND MEDIASTINUM: Unremarkable. SOFT TISSUES: No acute abnormality. BONES: No acute abnormality. CTA HEAD: ANTERIOR CIRCULATION: No significant stenosis of the internal carotid arteries. No significant stenosis of the anterior cerebral arteries. No significant stenosis of the middle cerebral arteries. No aneurysm. POSTERIOR CIRCULATION: No significant stenosis of the posterior cerebral arteries. No significant stenosis of the basilar artery. No significant stenosis of the vertebral arteries. No aneurysm. OTHER: No dural venous sinus thrombosis on this non-dedicated study. CT PERFUSION: Fusion imaging shows a 13 mL region of ischemia affecting both cerebellar hemispheres but worse on the left. No core infarct. EXAM QUALITY: Exam quality is adequate with diagnostic perfusion maps. No significant motion artifact. Appropriate arterial inflow and venous outflow curves. CORE INFARCT (CBF<30% volume): 0 mL TOTAL HYPOPERFUSION (Tmax>6s volume): 13 mL PENUMBRA: Mismatch volume: 13 mL Mismatch ratio: Not applicable Location: Cerebellar hemispheres, worse on the left. IMPRESSION: 1. Proximal left vertebral artery dissection. Distal v2 segment reconstitution but with attenuated enhancement relative to the contralateral side along the remainder of its course. 2. A 13 mL region of ischemia affecting both cerebellar hemispheres, worse on the left, with no core infarct. Findings discussed with Dr. Caron Salt at 09:05 pm on 07/05/2024  Electronically signed by: Franky Stanford MD 07/05/2024 09:07 PM EDT RP Workstation: [id number]   CT Head Wo Contrast Result Date: 07/05/2024 EXAM: CT HEAD WITHOUT CONTRAST 07/05/2024 06:33:51 PM TECHNIQUE: CT of the head was performed without the administration of intravenous contrast. Automated exposure control, iterative reconstruction, and/or weight based adjustment of the mA/kV was utilized to reduce the radiation dose to as low as reasonably achievable. COMPARISON: 08/24/2019 CLINICAL HISTORY: Seizure, new-onset, no history of trauma. Per chart: Pt BIB ems for seizure activity stated by her roommate. Pt described as overly emotional, was rolling around and throwing her hands around, keeps writing in a diary she has. Did not hit her head, no oral injury, no loss of urine. FINDINGS: BRAIN AND VENTRICLES: No acute hemorrhage. No evidence of acute infarct. No hydrocephalus. No extra-axial collection. No mass effect or midline shift. Old right cerebellar infarct but new since the prior study. ORBITS: No acute abnormality. SINUSES: No acute abnormality. SOFT TISSUES AND SKULL: No acute soft tissue abnormality. No skull fracture. IMPRESSION: 1. No acute intracranial abnormality. 2. New right cerebellar infarct since the prior study (2020), but likely chronic. Electronically signed by: Franky Stanford MD 07/05/2024 06:49 PM EDT RP Workstation: [id number]   DG Chest Portable 1 View Result Date: 07/05/2024 EXAM: 1 VIEW XRAY OF THE CHEST 07/05/2024 05:36:00 PM COMPARISON: 12/31/2017 CLINICAL HISTORY: ams. Per chart: Pt BIB ems for seizure activity stated by her roommate. Pt described as overly emotional, was rolling around and throwing her hands around, keeps writing in a diary she has. Did not hit her head, no oral injury, no loss of urine. A\T\Ox4. VS ; stable FINDINGS: LUNGS AND PLEURA: Low lung volumes. No focal pulmonary opacity. No pulmonary edema. No pleural effusion. No pneumothorax. HEART AND MEDIASTINUM: No acute  abnormality of the cardiac and mediastinal silhouettes. BONES AND SOFT TISSUES: No acute osseous abnormality. IMPRESSION: 1. No acute process. 2. Low lung volumes. Electronically signed by: Franky Stanford MD 07/05/2024 06:47 PM EDT RP Workstation: [id number]    Microbiology: Recent Results (from the past 240 hours)  Wet prep, genital  Status: None   Collection Time: 07/09/24  6:51 PM   Specimen: PATH Cytology Urine  Result Value Ref Range Status   Yeast Wet Prep HPF POC NONE SEEN NONE SEEN Final   Trich, Wet Prep NONE SEEN NONE SEEN Final   Clue Cells Wet Prep HPF POC NONE SEEN NONE SEEN Final   WBC, Wet Prep HPF POC <10 <10 Final   Sperm NONE SEEN  Final    Comment: Performed at [facility], [address], [place], [place] [address]     Labs: Basic Metabolic Panel: Recent Labs  Lab 07/07/24 [phone] 07/09/24 0355 07/10/24 0344 07/11/24 0435 07/12/24 0426  NA 143 143 144 142 142  K 4.0 3.5 3.6 4.2 3.7  CL 110 111 108 108 111  CO2 24 22 23  21* 21*  GLUCOSE 105* 80 85 91 77  BUN 8 10 11 11 9   CREATININE 1.06* 1.02* 0.96 0.90 0.72  CALCIUM  8.5* 8.9 9.0 9.1 8.4*  MG 1.9 1.8 2.0 2.0 1.8  PHOS 3.0 3.5 4.0 3.6 3.0   Liver Function Tests: Recent Labs  Lab 07/07/24 0317 07/09/24 0355 07/10/24 0344 07/11/24 0435 07/12/24 0426  AST 33 19 18 24  48*  ALT 48* 31 25 30  55*  ALKPHOS 62 62 69 66 58  BILITOT 0.5 0.6 0.7 0.6 0.5  PROT 6.4* 6.6 6.7 7.1 6.0*  ALBUMIN 3.3* 3.4* 3.4* 3.5 3.1*   No results for input(s): LIPASE, AMYLASE in the last 168 hours. No results for input(s): AMMONIA in the last 168 hours. CBC: Recent Labs  Lab 07/07/24 0317 07/09/24 0355 07/10/24 0344 07/11/24 0435 07/12/24 0426 07/13/24 0435  WBC 10.8* 8.1 7.3 9.0 6.5 5.9  NEUTROABS 7.8* 5.0 4.3 7.1 3.7  --   HGB 13.1 12.9 13.0 13.6 12.1 12.3  HCT 40.1 39.0 39.3 41.0 36.7 36.2  MCV 78.9* 77.7* 77.4* 77.5* 77.8* 76.7*  PLT 156 125* 135* 142* 119* 116*   Cardiac Enzymes: No results for  input(s): CKTOTAL, CKMB, CKMBINDEX, TROPONINI in the last 168 hours. BNP: BNP (last 3 results) No results for input(s): BNP in the last 8760 hours.  ProBNP (last 3 results) No results for input(s): PROBNP in the last 8760 hours.  CBG: No results for input(s): GLUCAP in the last 168 hours.  Signed:  Jai-Gurmukh Ieshia Hatcher MD   [facility] 07/13/2024, 11:55 AM

## 2024-07-13 NOTE — Progress Notes (Signed)
 Lorilee Sven SQUIBB, MD  Physician Physical Medicine and Rehabilitation   PMR Pre-admission    Signed   Date of Service: 07/13/2024  9:18 AM  Related encounter: ED to Hosp-Admission (Current) from 07/05/2024 in [facility]   Signed     Expand All Collapse All  PMR Admission Coordinator Pre-Admission Assessment   Patient: Cassidy Bruce is an 28 y.o., female MRN: [id number] DOB: 01/20/1996 Height: 5' 4 (162.6 cm) Weight: 65.8 kg   Insurance Information HMO:     PPO:      PCP:      IPA:      80/20:      OTHER:  PRIMARY: Medicaid Pending      Policy#:       Subscriber:  CM Name:       Phone#:      Fax#:  Pre-Cert#:       Employer:  Benefits:  Phone #:      Name:  Eff. Date:      Deduct:       Out of Pocket Max:       Life Max:  CIR:       SNF:  Outpatient:      Co-Pay:  Home Health:       Co-Pay:  DME:      Co-Pay:  Providers:  SECONDARY:       Policy#:      Phone#:    Artist:       Phone#:    The Data processing manager" for patients in Inpatient Rehabilitation Facilities with attached "Privacy Act Statement-Health Care Records" was provided and verbally reviewed with: Patient and Family   Emergency Contact Information Contact Information       Name Relation Home Work Mobile    [place]     [phone]         Other Contacts       Name Relation Home Work Mobile    [place] Aunt     [phone]           Current Medical History  Patient Admitting Diagnosis: CVA  History of Present Illness: Cassidy Bruce is a 27 y/o female with PMH ADHD/depression who presented to [facility] on 07/05/2024 with dizziness, slurred speech, headache, gait abnormality, as well as left side weakness.  Cranial CT scan showed no acute intracranial abnormality but new right cerebellar infarct since prior study of 2021 felt to be possibly chronic.  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  Distal V2 segment  reconstitution but with attenuated enhancement relative to the contralateral side along the remainder of its course.  A 13 mL region of ischemia affecting both cerebellar hemispheres, worse on the left with no core infarct.  MRI identified acute infarcts within the cerebellar vermis and bilateral cerebellar hemispheres.  Most notably, large acute infarct present within the superior cerebellar artery territories bilaterally.  Posterior fossa mass effect without cerebellar tonsillar herniation or evidence of obstructive hydrocephalus.  Petechial hemorrhage within the cerebellar vermis and superior right cerebellar hemisphere.  Patient did not receive TNK.  No thrombectomy needed for left VA occlusion as BA was patent.  Admission chemistries unremarkable except potassium 3.2, glucose 123, ALT 58, CK 284, urine drug screen negative, lactic acid within normal limits.  Echocardiogram with ejection fraction of 60 to 65% no regional wall motion abnormalities.  EEG negative for seizure.  TEE completed bubble study with small PFO, at rest LTR shunting and  with Valsalva RTL shunting and plan for follow-up outpatient with cardiology services.  Venous Doppler studies negative for DVT.  Patient initially placed on heparin  drip 9/8 x 48 hours transition to aspirin  81 mg daily and Plavix  75 mg daily for 3 months then aspirin  alone.  Psychiatry consulted 07/13/2024 for history of major depressive disorder.  Patient is tolerating a regular consistency diet.  Therapy evaluations completed and pt was recommended for a comprehensive rehab program.    Complete NIHSS TOTAL: 4   Patient's medical record from [facility] has been reviewed by the rehabilitation admission coordinator and physician.   Past Medical History      Past Medical History:  Diagnosis Date   ADHD     Depression            Has the patient had major surgery during 100 days prior to admission? No   Family History   family history includes CAD in an other  family member; Cancer in an other family member; Diabetes in an other family member; Healthy in her mother.   Current Medications  Current Medications    Current Facility-Administered Medications:    0.9 %  sodium chloride  infusion, , Intravenous, Continuous, Khaliqdina, Salman, MD, Last Rate: 100 mL/hr at 07/12/24 1214, New Bag at 07/12/24 1214   acetaminophen  (TYLENOL ) tablet 1,000 mg, 1,000 mg, Oral, Q8H, Lonni Slain, MD, 1,000 mg at 07/12/24 2147   aspirin  EC tablet 81 mg, 81 mg, Oral, Daily, Lonni Slain, MD, 81 mg at 07/13/24 9083   butalbital -acetaminophen -caffeine  (FIORICET ) 50-325-40 MG per tablet 1 tablet, 1 tablet, Oral, Q8H PRN, Lonni Slain, MD, 1 tablet at 07/10/24 9177   citalopram  (CELEXA ) tablet 20 mg, 20 mg, Oral, Daily, Perri DELENA Meliton Mickey., MD, 20 mg at 07/13/24 0916   clopidogrel  (PLAVIX ) tablet 75 mg, 75 mg, Oral, Daily, Reome, Earle J, RPH, 75 mg at 07/13/24 9083   ferrous sulfate  tablet 325 mg, 325 mg, Oral, QODAY, Perri DELENA Meliton Mickey., MD, 325 mg at 07/12/24 1215   heparin  ADULT infusion 100 units/mL (25000 units/250mL), 850 Units/hr, Intravenous, Continuous, Paytes, Austin A, RPH, Last Rate: 8.5 mL/hr at 07/13/24 0916, 850 Units/hr at 07/13/24 0916   ondansetron  (ZOFRAN ) injection 4 mg, 4 mg, Intravenous, Q6H PRN, Lonni Slain, MD, 4 mg at 07/10/24 1642   Oral care mouth rinse, 15 mL, Mouth Rinse, PRN, Perri DELENA Meliton Mickey., MD   oxyCODONE  (Oxy IR/ROXICODONE ) immediate release tablet 5 mg, 5 mg, Oral, Q4H PRN, Lonni Slain, MD, 5 mg at 07/09/24 1658   polyethylene glycol (MIRALAX  / GLYCOLAX ) packet 17 g, 17 g, Oral, BID, Perri DELENA Meliton Mickey., MD, 17 g at 07/10/24 0815   rosuvastatin  (CRESTOR ) tablet 20 mg, 20 mg, Oral, Daily, Lonni Slain, MD, 20 mg at 07/13/24 9083   thiamine  (VITAMIN B1) tablet 100 mg, 100 mg, Oral, Daily, Pham, Minh Q, RPH-CPP, 100 mg at 07/13/24 9083     Patients Current Diet:   Diet Order                  Diet regular Room service appropriate? Yes with Assist; Fluid consistency: Thin  Diet effective now                         Precautions / Restrictions Precautions Precautions: Fall Restrictions Weight Bearing Restrictions Per Provider Order: No    Has the patient had 2 or more falls or a fall with injury in the past year? No  Prior Activity Level Community (5-7x/wk): independent, working, driving   Prior Functional Level Self Care: Did the patient need help bathing, dressing, using the toilet or eating? Independent   Indoor Mobility: Did the patient need assistance with walking from room to room (with or without device)? Independent   Stairs: Did the patient need assistance with internal or external stairs (with or without device)? Independent   Functional Cognition: Did the patient need help planning regular tasks such as shopping or remembering to take medications? Independent   Patient Information Are you of Hispanic, Latino/a,or Spanish origin?: A. No, not of Hispanic, Latino/a, or Spanish origin What is your race?: B. Black or African American Do you need or want an interpreter to communicate with a doctor or health care staff?: 0. No   Patient's Response To:  Health Literacy and Transportation Is the patient able to respond to health literacy and transportation needs?: Yes Health Literacy - How often do you need to have someone help you when you read instructions, pamphlets, or other written material from your doctor or pharmacy?: Never In the past 12 months, has lack of transportation kept you from medical appointments or from getting medications?: No In the past 12 months, has lack of transportation kept you from meetings, work, or from getting things needed for daily living?: No   Home Assistive Devices / Equipment Home Equipment: None   Prior Device Use: Indicate devices/aids used by the patient prior to current illness,  exacerbation or injury? None of the above   Current Functional Level Cognition   Arousal/Alertness: Awake/alert Overall Cognitive Status: Impaired/Different from baseline Orientation Level: Oriented X4 Attention: Sustained Sustained Attention: Impaired Sustained Attention Impairment: Verbal basic Memory: Impaired Memory Impairment: Other (comment) (working memory) Awareness: Impaired Awareness Impairment: Intellectual impairment, Other (comment) (online) Problem Solving: Impaired Problem Solving Impairment: Verbal complex Executive Function: Self Monitoring Self Monitoring: Impaired Self Monitoring Impairment: Verbal basic Safety/Judgment: Impaired    Extremity Assessment (includes Sensation/Coordination)   Upper Extremity Assessment: LUE deficits/detail RUE Deficits / Details: ataxic however imporved motor control to feed self apple sauce and donn/doff socks with assist RUE Sensation: decreased light touch RUE Coordination: decreased fine motor LUE Deficits / Details: stronger and increased ROM proximally - able to comlet shoulder flexion/elbow flex/ext WFL with gravity eliminated; gross grasp but unable to make full fist; did not appear to demonstrate in-hand manipulation skills (unable to grasp sock) decreasing functional use of hand LUE Sensation: WNL LUE Coordination: decreased fine motor, decreased gross motor  Lower Extremity Assessment: Defer to PT evaluation RLE Deficits / Details: ataxic BLE's R>L, strength grossly 4/5 but with decreased ability to sustain a consistent contraction, pt with extraneous non purposeful movements when trying to move with control RLE Sensation: decreased proprioception RLE Coordination: decreased fine motor, decreased gross motor LLE Deficits / Details: strength was 4/5 throughout but with decreased ability to sustain a consistent contraction, ataxic but has more control L LE than R LLE Sensation: decreased proprioception LLE Coordination:  decreased fine motor, decreased gross motor     ADLs   Overall ADL's : Needs assistance/impaired Eating/Feeding: Minimal assistance (NPO however ST gave OK for pt to eat applesauce) Grooming: Minimal assistance Upper Body Bathing: Bed level, Minimal assistance Lower Body Bathing: Moderate assistance Lower Body Bathing Details (indicate cue type and reason): more with standing bathing. Upper Body Dressing : Sitting, Moderate assistance Lower Body Dressing: Moderate assistance Toilet Transfer: Moderate assistance, Stand-pivot, BSC/3in1, Rolling walker (2 wheels) Toileting- Clothing Manipulation and Hygiene: Maximal assistance,  Sit to/from stand Toileting - Clothing Manipulation Details (indicate cue type and reason): max A for balance while pt performed pericare. Functional mobility during ADLs: Moderate assistance     Mobility   Overal bed mobility: Needs Assistance Bed Mobility: Supine to Sit Rolling: Contact guard assist, Used rails Sidelying to sit: Supervision Supine to sit: Min assist Sit to supine: Min assist General bed mobility comments: MinA for balance when raising trunk     Transfers   Overall transfer level: Needs assistance Equipment used: Rolling walker (2 wheels) Transfers: Sit to/from Stand Sit to Stand: Min assist Bed to/from chair/wheelchair/BSC transfer type:: Step pivot Step pivot transfers: Mod assist General transfer comment: Good power up, slight posterior LOB upon initial standing requiring minA     Ambulation / Gait / Stairs / Wheelchair Mobility   Ambulation/Gait Ambulation/Gait assistance: Mod assist, +2 safety/equipment Gait Distance (Feet): 100 Feet Assistive device: Rolling walker (2 wheels) Gait Pattern/deviations: Ataxic, Narrow base of support, Step-through pattern General Gait Details: Good posture, up to modA for postural stability and with obstacle negotiation Gait velocity: decreased Gait velocity interpretation: <1.8 ft/sec, indicate of  risk for recurrent falls Pre-gait activities: wt shifting in standing Stairs: Yes Stairs assistance: Contact guard assist Stair Management: Two rails Number of Stairs: 10     Posture / Balance Dynamic Sitting Balance Sitting balance - Comments: stable with bilat UE support, otherwise mutlidirecitonal swaying. Balance Overall balance assessment: Needs assistance Sitting-balance support: Feet supported Sitting balance-Leahy Scale: Fair Sitting balance - Comments: stable with bilat UE support, otherwise mutlidirecitonal swaying. Standing balance support: Bilateral upper extremity supported, During functional activity, Reliant on assistive device for balance Standing balance-Leahy Scale: Poor Standing balance comment: Reliant on UE support and mod A     Special considerations/life events  Special service needs neuropsych    Previous Home Environment (from acute therapy documentation) Living Arrangements: Non-relatives/Friends Available Help at Discharge: Friend(s), Available PRN/intermittently Type of Home: Apartment (apt style hotel) Home Layout: One level Home Access: Stairs to enter Entrance Stairs-Rails: Can reach both, Right, Left Entrance Stairs-Number of Steps: 2 flights Bathroom Shower/Tub: Engineer, manufacturing systems: Standard Additional Comments: Pt slid OOB before EMS came   Discharge Living Setting Plans for Discharge Living Setting: Patient's home, Lives with (comment) (roommate, Zenobia) Type of Home at Discharge: Apartment Discharge Home Layout: One level Discharge Home Access: Stairs to enter Entrance Stairs-Rails: Can reach both Entrance Stairs-Number of Steps: 2 flights Discharge Bathroom Shower/Tub: Tub/shower unit Discharge Bathroom Toilet: Standard Discharge Bathroom Accessibility: Yes How Accessible: Accessible via walker   Social/Family/Support Systems Anticipated Caregiver: roommate, 2 aunts Anticipated Caregiver's Contact Information: primary  contacts: Zenobia  [phone] (roommate), and Shanda (aunt) [phone] Ability/Limitations of Caregiver: will need to come up with a schedule for 24/7 supervision Caregiver Availability: 24/7 Discharge Plan Discussed with Primary Caregiver: Yes Is Caregiver In Agreement with Plan?: Yes   Goals Patient/Family Goal for Rehab: PT/OT/SLP supervision Expected length of stay: 12-16 days Additional Information: Discharge plan: home with family and friends providing caregiver support, expect supervision level goals and Zenobia/Vanessa aware of need for 24/7 initially Pt/Family Agrees to Admission and willing to participate: Yes Program Orientation Provided & Reviewed with Pt/Caregiver Including Roles  & Responsibilities: Yes   Decrease burden of Care through IP rehab admission: n/a   Possible need for SNF placement upon discharge:  Not anticipated.  Alyse Rimes working on transitioning to [facility] apartment, will have support from Zenobia and her two Aunts Vanessa and [place].  Patient Condition: I have reviewed medical records from [facility], spoken with [facility] team, and patient and family member. I met with patient at the bedside for inpatient rehabilitation assessment.  Patient will benefit from ongoing PT, OT, and SLP, can actively participate in 3 hours of therapy a day 5 days of the week, and can make measurable gains during the admission.  Patient will also benefit from the coordinated team approach during an Inpatient Acute Rehabilitation admission.  The patient will receive intensive therapy as well as Rehabilitation physician, nursing, social worker, and care management interventions.  Due to safety, skin/wound care, disease management, medication administration, pain management, and patient education the patient requires 24 hour a day rehabilitation nursing.  The patient is currently min to mod assist with mobility and basic ADLs.  Discharge setting and therapy post discharge at  home with home health is anticipated.  Patient has agreed to participate in the Acute Inpatient Rehabilitation Program and will admit today.   Preadmission Screen Completed By:  Reche FORBES Lowers, PT, DPT 07/13/2024 10:54 AM ______________________________________________________________________   Discussed status with Dr. Lorilee  on  07/13/24  at  10:54 AM  and received approval for admission today.   Admission Coordinator:  Berdia Lachman E Eirene Rather, PT, time 10:54 AM Pattricia 07/13/24     Assessment/Plan: Diagnosis: CVA Does the need for close, 24 hr/day Medical supervision in concert with the patient's rehab needs make it unreasonable for this patient to be served in a less intensive setting? Yes Co-Morbidities requiring supervision/potential complications: ADHD, depression, dizziness, slurred speech, headache Due to bladder management, bowel management, safety, skin/wound care, disease management, medication administration, pain management, and patient education, does the patient require 24 hr/day rehab nursing? Yes Does the patient require coordinated care of a physician, rehab nurse, PT, OT, and SLP to address physical and functional deficits in the context of the above medical diagnosis(es)? Yes Addressing deficits in the following areas: balance, endurance, locomotion, strength, transferring, bowel/bladder control, bathing, dressing, feeding, grooming, toileting, speech, and psychosocial support Can the patient actively participate in an intensive therapy program of at least 3 hrs of therapy 5 days a week? Yes The potential for patient to make measurable gains while on inpatient rehab is excellent Anticipated functional outcomes upon discharge from inpatient rehab: min assist PT, min assist OT, min assist SLP Estimated rehab length of stay to reach the above functional goals is: 10-14 days Anticipated discharge destination: Home 10. Overall Rehab/Functional Prognosis: excellent     MD  Signature: Sven Lorilee, MD         Revision History

## 2024-07-13 NOTE — Progress Notes (Signed)
 You may bring the patient to 4M07.

## 2024-07-13 NOTE — H&P (Shared)
 Physical Medicine and Rehabilitation Admission H&P    Chief Complaint  Patient presents with   Seizures  : HPI: Cassidy Bruce is a 28 year old right-handed African-American female with past medical history of ADHD/depression.  On the prescription medications other than birth control.  Per chart review patient lives with a friend.  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  They live in a second floor apartment.  Plans to discharge home with friend and 2 aunts with good support.  Family is checking on ground availability apartments.  Presented 07/05/2024 with dizziness, slurred speech headache and gait abnormality as well as left side weakness.  Per family she had went to the gym on Monday, 07/04/2024 for weightlifting as usual no specific problems.  Tuesday morning she awoke from sleep with headache and neck pain and by the afternoon patient with increasing dizziness/vertigo as well as slurred speech.  Cranial CT scan showed no acute intracranial abnormality new right cerebellar infarct since prior study of 2021 felt to be possibly chronic.  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  Distal V2 segment reconstitution but with attenuated enhancement relative to the contralateral side along the remainder of its course.  A 13 mL region of ischemia affecting both cerebellar hemispheres, worse on the left with no core infarct.  MRI identified acute infarcts within the cerebellar vermis and bilateral cerebellar hemispheres.  Most notably, large acute infarct present within the superior cerebellar artery territories bilaterally.  Posterior fossa mass effect without cerebellar tonsillar herniation or evidence of obstructive hydrocephalus.  Petechial hemorrhage within the cerebellar vermis and superior right cerebellar hemisphere.  Patient did not receive TNK.  No thrombectomy needed for left VA occlusion as BA was patent.  Admission chemistries unremarkable except  potassium 3.2, glucose 123, ALT 58, CK 284, urine drug screen negative, lactic acid within normal limits.  Echocardiogram with ejection fraction of 60 to 65% no regional wall motion abnormalities.  EEG negative for seizure.  TEE completed bubble study with small PFO, at rest LTR shunting and with Valsalva RTL shunting and plan for follow-up outpatient with cardiology services.  Venous Doppler studies negative for DVT.  Patient initially placed on heparin  drip 9/8 x 48 hours transition to aspirin  81 mg daily and Plavix  75 mg daily for 3 months then aspirin  alone.  Psychiatry consulted 07/13/2024 for history of major depressive disorder.  Patient is tolerating a regular consistency diet.  Therapy evaluations completed due to patient decreased functional mobility was admitted for a comprehensive rehab program.  Review of Systems  Constitutional:  Negative for chills and fever.  HENT:  Negative for hearing loss.   Eyes:  Negative for blurred vision and double vision.  Respiratory:  Negative for cough, shortness of breath and wheezing.   Cardiovascular:  Negative for chest pain, palpitations and leg swelling.  Gastrointestinal:  Positive for constipation. Negative for heartburn, nausea and vomiting.  Genitourinary:  Negative for dysuria, flank pain and hematuria.  Skin:  Negative for rash.  Neurological:  Positive for dizziness, sensory change, speech change, focal weakness, weakness and headaches.  Psychiatric/Behavioral:  Positive for depression.   All other systems reviewed and are negative.  Past Medical History:  Diagnosis Date   ADHD    Depression    Past Surgical History:  Procedure Laterality Date   TRANSESOPHAGEAL ECHOCARDIOGRAM (CATH LAB) N/A 07/07/2024   Procedure: TRANSESOPHAGEAL ECHOCARDIOGRAM;  Surgeon: Lonni Slain, MD;  Location: [facility] INVASIVE CV LAB;  Service: Cardiovascular;  Laterality: N/A;   Family History  Problem Relation Age of Onset   Cancer Other    Diabetes  Other    CAD Other    Healthy Mother    Social History:  reports that she has never smoked. She has never used smokeless tobacco. She reports that she does not drink alcohol and does not use drugs. Allergies: No Known Allergies Medications Prior to Admission  Medication Sig Dispense Refill   acetaminophen  (TYLENOL ) 500 MG tablet Take 500 mg by mouth every 6 (six) hours as needed.     fluconazole  (DIFLUCAN ) 150 MG tablet Take one tablet today and one tablet in 3 days if symptoms persist. (Patient not taking: Reported on 07/06/2024) 2 tablet 0      Home: Home Living Family/patient expects to be discharged to:: Private residence Living Arrangements: Non-relatives/Friends Available Help at Discharge: Friend(s), Available PRN/intermittently Type of Home: Apartment (apt style hotel) Home Access: Stairs to enter Secretary/administrator of Steps: 2 flights Entrance Stairs-Rails: Can reach both, Right, Left Home Layout: One level Bathroom Shower/Tub: Engineer, manufacturing systems: Standard Home Equipment: None Additional Comments: Pt slid OOB before EMS came   Functional History: Prior Function Prior Level of Function : Independent/Modified Independent, History of Falls (last six months), Working/employed, Driving Mobility Comments: ind no AD, 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 ADLs Comments: ind  Functional Status:  Mobility: Bed Mobility Overal bed mobility: Needs Assistance Bed Mobility: Supine to Sit Rolling: Contact guard assist, Used rails Sidelying to sit: Supervision Supine to sit: Min assist Sit to supine: Min assist General bed mobility comments: MinA for balance when raising trunk Transfers Overall transfer level: Needs assistance Equipment used: Rolling walker (2 wheels) Transfers: Sit to/from Stand Sit to Stand: Min assist Bed to/from chair/wheelchair/BSC transfer type:: Step pivot Step pivot transfers: Mod assist General transfer comment: Good power up, slight  posterior LOB upon initial standing requiring minA Ambulation/Gait Ambulation/Gait assistance: Mod assist, +2 safety/equipment Gait Distance (Feet): 100 Feet Assistive device: Rolling walker (2 wheels) Gait Pattern/deviations: Ataxic, Narrow base of support, Step-through pattern General Gait Details: Good posture, up to modA for postural stability and with obstacle negotiation Gait velocity: decreased Gait velocity interpretation: <1.8 ft/sec, indicate of risk for recurrent falls Pre-gait activities: wt shifting in standing Stairs: Yes Stairs assistance: Contact guard assist Stair Management: Two rails Number of Stairs: 10    ADL: ADL Overall ADL's : Needs assistance/impaired Eating/Feeding: Minimal assistance (NPO however ST gave OK for pt to eat applesauce) Grooming: Minimal assistance Upper Body Bathing: Bed level, Minimal assistance Lower Body Bathing: Moderate assistance Lower Body Bathing Details (indicate cue type and reason): more with standing bathing. Upper Body Dressing : Sitting, Moderate assistance Lower Body Dressing: Moderate assistance Toilet Transfer: Moderate assistance, Stand-pivot, BSC/3in1, Rolling walker (2 wheels) Toileting- Clothing Manipulation and Hygiene: Maximal assistance, Sit to/from stand Toileting - Clothing Manipulation Details (indicate cue type and reason): max A for balance while pt performed pericare. Functional mobility during ADLs: Moderate assistance  Cognition: Cognition Overall Cognitive Status: Impaired/Different from baseline Arousal/Alertness: Awake/alert Orientation Level: Oriented X4 Attention: Sustained Sustained Attention: Impaired Sustained Attention Impairment: Verbal basic Memory: Impaired Memory Impairment: Other (comment) (working memory) Awareness: Impaired Awareness Impairment: Intellectual impairment, Other (comment) (online) Problem Solving: Impaired Problem Solving Impairment: Verbal complex Executive Function:  Self Monitoring Self Monitoring: Impaired Self Monitoring Impairment: Verbal basic Safety/Judgment: Impaired Cognition Arousal: Alert Behavior During Therapy: WFL for tasks assessed/performed Overall Cognitive Status: Impaired/Different from baseline  Physical Exam: Blood pressure  128/72, pulse (!) 49, temperature 98 F (36.7 C), resp. rate 18, height 5' 4 (1.626 m), weight 65.8 kg, SpO2 100%. Physical Exam Neurological:     Comments: Patient is alert.  Sitting up in bed.  Makes eye contact with examiner.  Follows simple commands.  Differentiates left from right.  Pseudo bulbar speech.     Results for orders placed or performed during the hospital encounter of 07/05/24 (from the past 48 hours)  Heparin  level (unfractionated)     Status: Abnormal   Collection Time: 07/11/24 10:56 PM  Result Value Ref Range   Heparin  Unfractionated 0.20 (L) 0.30 - 0.70 IU/mL    Comment: (NOTE) The clinical reportable range upper limit is being lowered to >1.10 to align with the FDA approved guidance for the current laboratory assay.  If heparin  results are below expected values, and patient dosage has  been confirmed, suggest follow up testing of antithrombin III levels. Performed at [facility], [address], [place], [place] [address]   Heparin  level (unfractionated)     Status: None   Collection Time: 07/12/24  4:26 AM  Result Value Ref Range   Heparin  Unfractionated 0.42 0.30 - 0.70 IU/mL    Comment: (NOTE) The clinical reportable range upper limit is being lowered to >1.10 to align with the FDA approved guidance for the current laboratory assay.  If heparin  results are below expected values, and patient dosage has  been confirmed, suggest follow up testing of antithrombin III levels. Performed at [facility], [address], [place], [place] [address]   CBC with Differential/Platelet     Status: Abnormal   Collection Time: 07/12/24  4:26 AM  Result Value Ref Range    WBC 6.5 4.0 - 10.5 K/uL   RBC 4.72 3.87 - 5.11 MIL/uL   Hemoglobin 12.1 12.0 - 15.0 g/dL   HCT 63.2 63.9 - 53.9 %   MCV 77.8 (L) 80.0 - 100.0 fL   MCH 25.6 (L) 26.0 - 34.0 pg   MCHC 33.0 30.0 - 36.0 g/dL   RDW 85.1 88.4 - 84.4 %   Platelets 119 (L) 150 - 400 K/uL   nRBC 0.0 0.0 - 0.2 %   Neutrophils Relative % 57 %   Neutro Abs 3.7 1.7 - 7.7 K/uL   Lymphocytes Relative 29 %   Lymphs Abs 1.9 0.7 - 4.0 K/uL   Monocytes Relative 10 %   Monocytes Absolute 0.7 0.1 - 1.0 K/uL   Eosinophils Relative 3 %   Eosinophils Absolute 0.2 0.0 - 0.5 K/uL   Basophils Relative 1 %   Basophils Absolute 0.1 0.0 - 0.1 K/uL   Immature Granulocytes 0 %   Abs Immature Granulocytes 0.01 0.00 - 0.07 K/uL    Comment: Performed at [facility], [address], [place], [place] [address]  Comprehensive metabolic panel with GFR     Status: Abnormal   Collection Time: 07/12/24  4:26 AM  Result Value Ref Range   Sodium 142 135 - 145 mmol/L   Potassium 3.7 3.5 - 5.1 mmol/L   Chloride 111 98 - 111 mmol/L   CO2 21 (L) 22 - 32 mmol/L   Glucose, Bld 77 70 - 99 mg/dL    Comment: Glucose reference range applies only to samples taken after fasting for at least 8 hours.   BUN 9 6 - 20 mg/dL   Creatinine, Ser 9.27 0.44 - 1.00 mg/dL   Calcium  8.4 (L) 8.9 - 10.3 mg/dL  Total Protein 6.0 (L) 6.5 - 8.1 g/dL   Albumin 3.1 (L) 3.5 - 5.0 g/dL   AST 48 (H) 15 - 41 U/L   ALT 55 (H) 0 - 44 U/L   Alkaline Phosphatase 58 38 - 126 U/L   Total Bilirubin 0.5 0.0 - 1.2 mg/dL   GFR, Estimated >39 >39 mL/min    Comment: (NOTE) Calculated using the CKD-EPI Creatinine Equation (2021)    Anion gap 10 5 - 15    Comment: Performed at [facility], [address], [place], [place] [address]  Magnesium      Status: None   Collection Time: 07/12/24  4:26 AM  Result Value Ref Range   Magnesium  1.8 1.7 - 2.4 mg/dL    Comment: Performed at [facility], [address] [place], [place] [address]  Phosphorus      Status: None   Collection Time: 07/12/24  4:26 AM  Result Value Ref Range   Phosphorus 3.0 2.5 - 4.6 mg/dL    Comment: Performed at [facility], [address] [place], [place] [address]   DG Swallowing Func-Speech Pathology Result Date: 07/12/2024 Table formatting from the original result was not included. Modified Barium Swallow Study Patient Details Name: Bettyanne Dittman MRN: [id number] Date of Birth: [birth year] Today's Date: 07/12/2024 HPI/PMH: HPI: Pt is a 28 yo female presenting to [facility] on 07/05/24 for dizziness, slurred speech, and impaired balance. MRI showed bilat cerebellar infarct with L>R. CTA head/neck showed proximal L vertebral artery dissection. Pt initially passed a swallow screen but was observed to have difficulty swallowing 9/7 and repeat swallow screen was not passed. PMH of ADHD, MDD. Clinical Impression: Clinical Impression: Pt presents with mild dysphagia. Oral phase fluctuates, at times occurring swiftly and with swift, complete posterior propulsion. Other times, her lingual transit is slow and she has more oral residue, although even when she only swallows a little bit of the bolus on the initial swallow, she uses additional swallows (usually 1-2) to effectively clear her oral cavity. There was a single instance of trace, transient aspiration on the initial spoonful of thin liquid (PAS 6, cleared with a spontaneous throat clear) that seemed to be more a result of reduced oral control, as pharyngeal phase otherwise was [facility] across the study. Recommend resuming regular solids and thin liquids. Factors that may increase risk of adverse event in presence of aspiration Noe & Lianne 2021): Factors that may increase risk of adverse event in presence of aspiration Noe & Lianne 2021): Reduced cognitive function Recommendations/Plan: Swallowing Evaluation Recommendations Swallowing Evaluation Recommendations Recommendations: PO diet PO Diet Recommendation: Regular; Thin liquids (Level  0) Liquid Administration via: Cup; Straw Medication Administration: Whole meds with liquid Supervision: Patient able to self-feed; Intermittent supervision/cueing for swallowing strategies Swallowing strategies  : Slow rate; Small bites/sips Postural changes: Position pt fully upright for meals Oral care recommendations: Oral care BID (2x/day) Treatment Plan Treatment Plan Treatment recommendations: Therapy as outlined in treatment plan below Follow-up recommendations: Acute inpatient rehab (3 hours/day) Functional status assessment: Patient has had a recent decline in their functional status and demonstrates the ability to make significant improvements in function in a reasonable and predictable amount of time. Treatment frequency: Min 2x/week Treatment duration: 2 weeks Interventions: Aspiration precaution training; Patient/family education; Diet toleration management by SLP Recommendations Recommendations for follow up therapy are one component of a multi-disciplinary discharge planning process, led by the attending physician.  Recommendations may be updated based on patient status, additional functional  criteria and insurance authorization. Assessment: Orofacial Exam: Orofacial Exam Oral Cavity: Oral Hygiene: WFL Oral Cavity - Dentition: Adequate natural dentition Orofacial Anatomy: WFL Oral Motor/Sensory Function: WFL Anatomy: Anatomy: WFL Boluses Administered: Boluses Administered Boluses Administered: Thin liquids (Level 0); Mildly thick liquids (Level 2, nectar thick); Puree; Solid  Oral Impairment Domain: Oral Impairment Domain Lip Closure: No labial escape Tongue control during bolus hold: Escape to lateral buccal cavity/floor of mouth Bolus preparation/mastication: Slow prolonged chewing/mashing with complete recollection Bolus transport/lingual motion: Slow tongue motion Oral residue: Majority of bolus remaining (piecemeal) Location of oral residue : Tongue Initiation of pharyngeal swallow : Valleculae   Pharyngeal Impairment Domain: Pharyngeal Impairment Domain Soft palate elevation: No bolus between soft palate (SP)/pharyngeal wall (PW) Laryngeal elevation: Complete superior movement of thyroid cartilage with complete approximation of arytenoids to epiglottic petiole Anterior hyoid excursion: Partial anterior movement Epiglottic movement: Complete inversion Laryngeal vestibule closure: Complete, no air/contrast in laryngeal vestibule Pharyngeal stripping wave : Present - complete Pharyngeal contraction (A/P view only): N/A Pharyngoesophageal segment opening: Complete distension and complete duration, no obstruction of flow Tongue base retraction: No contrast between tongue base and posterior pharyngeal wall (PPW) Pharyngeal residue: Complete pharyngeal clearance Location of pharyngeal residue: N/A  Esophageal Impairment Domain: Esophageal Impairment Domain Esophageal clearance upright position: Complete clearance, esophageal coating Pill: Pill Consistency administered: Thin liquids (Level 0) Thin liquids (Level 0): [facility] Penetration/Aspiration Scale Score: Penetration/Aspiration Scale Score 1.  Material does not enter airway: Mildly thick liquids (Level 2, nectar thick); Puree; Solid; Pill 6.  Material enters airway, passes BELOW cords then ejected out: Thin liquids (Level 0) (x1 on initial spoonful of thin liquid) Compensatory Strategies: Compensatory Strategies Compensatory strategies: No   General Information: Caregiver present: No  Diet Prior to this Study: NPO   Temperature : Normal   Respiratory Status: WFL   Supplemental O2: None (Room air)   History of Recent Intubation: No  Behavior/Cognition: Alert; Cooperative; Requires cueing Self-Feeding Abilities: Able to self-feed Baseline vocal quality/speech: Other (comment) (monotone) Volitional Cough: Able to elicit Volitional Swallow: Able to elicit Exam Limitations: No limitations Goal Planning: Prognosis for improved oropharyngeal function: Good Barriers to  Reach Goals: Cognitive deficits No data recorded Patient/Family Stated Goal: none stated Consulted and agree with results and recommendations: Patient Pain: Pain Assessment Pain Assessment: Faces Faces Pain Scale: 0 End of Session: Start Time:SLP Start Time (ACUTE ONLY): 1016 Stop Time: SLP Stop Time (ACUTE ONLY): 1030 Time Calculation:SLP Time Calculation (min) (ACUTE ONLY): 14 min Charges: SLP Evaluations $ SLP Speech Visit: 1 Visit SLP Evaluations $BSS Swallow: 1 Procedure $MBS Swallow: 1 Procedure $Speech Treatment for Individual: 1 Procedure SLP visit diagnosis: SLP Visit Diagnosis: Dysphagia, oral phase (R13.11) Past Medical History: Past Medical History: Diagnosis Date  ADHD   Depression  Past Surgical History: Past Surgical History: Procedure Laterality Date  TRANSESOPHAGEAL ECHOCARDIOGRAM (CATH LAB) N/A 07/07/2024  Procedure: TRANSESOPHAGEAL ECHOCARDIOGRAM;  Surgeon: Lonni Slain, MD;  Location: [facility] INVASIVE CV LAB;  Service: Cardiovascular;  Laterality: N/A; Leita SAILOR., M.A. CCC-SLP Acute Rehabilitation Services Office: [phone] Secure chat preferred 07/12/2024, 11:21 AM  MR BRAIN WO CONTRAST Result Date: 07/11/2024 CLINICAL DATA:  Follow-up examination for stroke EXAM: MRI HEAD WITHOUT CONTRAST TECHNIQUE: Multiplanar, multiecho pulse sequences of the brain and surrounding structures were obtained without intravenous contrast. COMPARISON:  Prior MRI from 07/06/2024 FINDINGS: Brain: Previously identified bilateral cerebellar infarcts again seen, primarily involving the SCA distributions, left slightly worse than right, stable. Possible further extension versus wallerian degeneration seen involving the midbrain since  previous (series 2, images 19, 20). Otherwise, overall distribution is relatively stable. Associated susceptibility artifact consistent with petechial hemorrhage. Superimposed intrinsic T1 hyperintensity at the right greater than left cerebellum. No frank hyperdense blood products or  intraparenchymal hematoma seen on CT from earlier the same day. Otherwise, remainder the brain remains normal in appearance. No other evidence for acute or subacute ischemia. No other areas of chronic cortical infarction. No other acute or chronic intracranial blood products. No mass lesion or midline shift. No hydrocephalus or extra-axial fluid collection. Basilar cisterns remain patent. Pituitary gland suprasellar region within normal limits. Vascular: Loss of normal flow void within the left vertebral artery, consistent with previously identified dissection. Major intracranial vascular flow voids are otherwise maintained. Skull and upper cervical spine: Craniocervical junction within normal limits. Bone marrow signal intensity felt to be within normal limits for age. No scalp soft tissue abnormality. Sinuses/Orbits: Globes orbital soft tissues within normal limits. Paranasal sinuses are clear. No significant mastoid effusion. Other: None. IMPRESSION: 1. Evolving early subacute bilateral cerebellar infarcts, left slightly worse than right. Possible mild interval expansion versus changes of Wallerian degeneration (favored) involving the midbrain since previous. Associated petechial hemorrhage without frank hemorrhagic transformation. 2. Loss of normal flow void within the left vertebral artery, consistent with previously identified dissection. 3. Otherwise stable and normal brain MRI. Electronically Signed   By: Morene Hoard M.D.   On: 07/11/2024 19:30   CT ANGIO HEAD NECK W WO CM Result Date: 07/11/2024 EXAM: CTA HEAD AND NECK WITH AND WITHOUT 07/11/2024 12:58:50 PM TECHNIQUE: CTA of the head and neck was performed with and without the administration of intravenous contrast. Multiplanar 2D and/or 3D reformatted images are provided for review. Automated exposure control, iterative reconstruction, and/or weight based adjustment of the mA/kV was utilized to reduce the radiation dose to as low as reasonably  achievable. Stenosis of the internal carotid arteries measured using NASCET criteria. COMPARISON: MRI head 07/06/24 and CTA head and neck and CT head 07/05/24 CLINICAL HISTORY: Neuro deficit, acute, stroke suspected. FINDINGS: CTA NECK: AORTIC ARCH AND ARCH VESSELS: No dissection or arterial injury. No significant stenosis of the brachiocephalic or subclavian arteries. Common aeration of the brachiocephalic and left common carotid arteries. CERVICAL CAROTID ARTERIES: No dissection, arterial injury, or hemodynamically significant stenosis by NASCET criteria. CERVICAL VERTEBRAL ARTERIES: The right vertebral artery is patent from the origin to the vertebrobasilar confluence. Redemonstrated left vertebral artery dissection. There is occlusion of the proximal left V1 segment. Reconstitution along the distal V2 segment is similar to prior. There is increased irregularity of the left V3 segment with decreased vessel caliber. The left V4 segment also appears slightly decreased in caliber. LUNGS AND MEDIASTINUM: Unremarkable. SOFT TISSUES: No acute abnormality. BONES: No acute abnormality. CTA HEAD: ANTERIOR CIRCULATION: No significant stenosis of the internal carotid arteries. No significant stenosis of the anterior cerebral arteries. No significant stenosis of the middle cerebral arteries. No aneurysm. POSTERIOR CIRCULATION: The basilar artery is patent. The posterior cerebral arteries are patent bilaterally. The superior cerebellar arteries are patent bilaterally. Small posterior communicating arteries visualized bilaterally. The posterior inferior cerebellar arteries are visualized bilaterally. OTHER: Evolving bilateral cerebellar infarcts, particularly within the superior cerebral artery territories, with associated mass effect. There is partial effacement of the fourth ventricle. There is no CT evidence of large territory infarct within the supratentorial parenchyma. Question subtle increase in caliber of the lateral  ventricles and third ventricle compared to prior without frank hydrocephalus on the current study. There is no midline shift.  No acute intracranial hemorrhage. No dural venous sinus thrombosis on this non-dedicated study. IMPRESSION: 1. Evolving bilateral cerebellar infarcts, particularly within the superior cerebellar artery territories, with associated mass effect and partial effacement of the fourth ventricle. 2. No acute intracranial hemorrhage. 3. Redemonstrated left vertebral artery dissection with occlusion of the proximal left V1 segment and reconstitution along the distal V2 segment. Increased irregularity of the left V3 segment with decreased vessel caliber, and slightly decreased caliber of the left V4 segment. 4. Anterior circulation is unremarkable. 5. Findings discussed with Dr. Vanessa at 1:24PM on 07/11/24. Electronically signed by: Donnice Mania MD 07/11/2024 01:48 PM EDT RP Workstation: [id number]      Blood pressure 128/72, pulse (!) 49, temperature 98 F (36.7 C), resp. rate 18, height 5' 4 (1.626 m), weight 65.8 kg, SpO2 100%.  Medical Problem List and Plan: 1. 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  Plan repeat CTA 2-3 months to evaluate for improvement dissection  -patient may *** shower  -ELOS/Goals: *** 2.  Antithrombotics: -DVT/anticoagulation:  Pharmaceutical: Heparin  x 48 hours 07/12/19/2025  -antiplatelet therapy: Aspirin  81 mg daily and Plavix  75 mg daily x 3 months then aspirin  alone 3. Pain Management: Oxycodone /Fioricet  as needed 4. Mood/Behavior/Sleep/MDD/ADHD: Celexa  20 mg daily.  Provide emotional support.  Psychiatry consulted  -antipsychotic agents: N/A 5. Neuropsych/cognition: This patient is not capable of making decisions on her own behalf. 6. Skin/Wound Care: Routine skin checks 7. Fluids/Electrolytes/Nutrition: Routine ins  and outs with follow-up chemistries 8.  PFO.  TEE showed positive PFO, at rest LTR shunting and with Valsalva RTL shunting.  Follow-up outpatient cardiology service for question PFO closure 9.  Hyperlipidemia.  Crestor  20 mg daily 10.  Microcytosis.  Low normal ferritin.  Placed on iron supplement    Mitsue Peery J Avonda Toso, PA-C 07/13/2024
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
## 2024-07-13 NOTE — TOC Transition Note (Signed)
 Transition of Care [facility]) - Discharge Note   Patient Details  Name: Cassidy Bruce MRN: [id number] Date of Birth: [birth year]  Transition [facility] CM/SW Contact:  Almarie CHRISTELLA Goodie, LCSW Phone Number: 07/13/2024, 9:49 AM   Clinical Narrative:   CSW updated by Rehab Admissions that patient has a bed at CIR today, no further TOC needs at this time.    Final next level of care: IP Rehab Facility Barriers to Discharge: Barriers Resolved   Patient Goals and CMS Choice Patient states their goals for this hospitalization and ongoing recovery are:: patient unable to participate in goal setting, not fully oriented CMS Medicare.gov Compare Post Acute Care list provided to:: Patient Represenative (must comment) Choice offered to / list presented to :  [place], Aunts)      Discharge Placement                       Discharge Plan and Services Additional resources added to the After Visit Summary for       Post Acute Care Choice: IP Rehab                               Social Drivers of Health (SDOH) Interventions SDOH Screenings   Food Insecurity: No Food Insecurity (07/06/2024)  Housing: Low Risk  (07/06/2024)  Transportation Needs: No Transportation Needs (07/06/2024)  Utilities: Not At Risk (07/06/2024)  Alcohol Screen: Low Risk  (07/25/2018)  Financial Resource Strain: Low Risk  (05/09/2024)   Received from [facility]  Social Connections: Unknown (07/09/2023)   Received from [facility]  Tobacco Use: Low Risk  (07/07/2024)     Readmission Risk Interventions     No data to display

## 2024-07-13 NOTE — Discharge Instructions (Addendum)
 Inpatient Rehab Discharge Instructions  Cassidy Bruce Discharge date and time: No discharge date for patient encounter.   Activities/Precautions/ Functional Status: Activity: As tolerated Diet: Regular Wound Care: Routine skin checks Functional status:  ___ No restrictions     ___ Walk up steps independently ___ 24/7 supervision/assistance   ___ Walk up steps with assistance ___ Intermittent supervision/assistance  ___ Bathe/dress independently ___ Walk with walker     _x__ Bathe/dress with assistance ___ Walk Independently    ___ Shower independently ___ Walk with assistance    ___ Shower with assistance ___ No alcohol     ___ Return to work/school ________  Special Instructions:  No driving smoking or alcohol  Continue aspirin  81 mg daily and Plavix  75 mg daily x 3 months then aspirin  alone  COMMUNITY REFERRALS UPON DISCHARGE:    Home Exercise Program given to patient due to not eligible for home health or outpatient due to uninsured   Medical Equipment/Items Ordered:shower chair and bedside commode                                                 Agency/Supplier:[employer] Match given to provide 30 days worth of medications Disability application begun via [facility] [phone] Medicaid applied for while here in hospital     My questions have been answered and I understand these instructions. I will adhere to these goals and the provided educational materials after my discharge from the hospital.  Patient/Caregiver Signature _______________________________ Date __________  Clinician Signature _______________________________________ Date __________  Please bring this form and your medication list with you to all your follow-up doctor's appointments. STROKE/TIA DISCHARGE INSTRUCTIONS SMOKING Cigarette smoking nearly doubles your risk of having a stroke & is the single most alterable risk factor  If you smoke or have smoked in the last 12 months, you are advised to  quit smoking for your health. Most of the excess cardiovascular risk related to smoking disappears within a year of stopping. Ask you doctor about anti-smoking medications [place] Quit Line: 1-800-QUIT NOW Free Smoking Cessation Classes [phone]  CHOLESTEROL Know your levels; limit fat & cholesterol in your diet  Lipid Panel     Component Value Date/Time   CHOL 179 07/06/2024 0625   TRIG 42 07/06/2024 0625   HDL 67 07/06/2024 0625   CHOLHDL 2.7 07/06/2024 0625   VLDL 8 07/06/2024 0625   LDLCALC 104 (H) 07/06/2024 0625     Many patients benefit from treatment even if their cholesterol is at goal. Goal: Total Cholesterol (CHOL) less than 160 Goal:  Triglycerides (TRIG) less than 150 Goal:  HDL greater than 40 Goal:  LDL (LDLCALC) less than 100   BLOOD PRESSURE American Stroke Association blood pressure target is less that 120/80 mm/Hg  Your discharge blood pressure is:  BP: 111/74 Monitor your blood pressure Limit your salt and alcohol intake Many individuals will require more than one medication for high blood pressure  DIABETES (A1c is a blood sugar average for last 3 months) Goal HGBA1c is under 7% (HBGA1c is blood sugar average for last 3 months)  Diabetes: No known diagnosis of diabetes    Lab Results  Component Value Date   HGBA1C 5.4 07/06/2024    Your HGBA1c can be lowered with medications, healthy diet, and exercise. Check your blood sugar as directed by your physician Call your physician  if you experience unexplained or low blood sugars.  PHYSICAL ACTIVITY/REHABILITATION Goal is 30 minutes at least 4 days per week  Activity: Increase activity slowly, Therapies: Physical Therapy: Home Health Return to work:  Activity decreases your risk of heart attack and stroke and makes your heart stronger.  It helps control your weight and blood pressure; helps you relax and can improve your mood. Participate in a regular exercise program. Talk with your doctor about the best form  of exercise for you (dancing, walking, swimming, cycling).  DIET/WEIGHT Goal is to maintain a healthy weight  Your discharge diet is:  Diet Order             Diet regular Room service appropriate? Yes with Assist; Fluid consistency: Thin  Diet effective now                   liquids Your height is:  Height: 5' 4 (162.6 cm) Your current weight is: Weight: 65.3 kg Your Body Mass Index (BMI) is:  BMI (Calculated): 24.7 Following the type of diet specifically designed for you will help prevent another stroke. Your goal weight range is:   Your goal Body Mass Index (BMI) is 19-24. Healthy food habits can help reduce 3 risk factors for stroke:  High cholesterol, hypertension, and excess weight.  RESOURCES Stroke/Support Group:  Call [phone]   STROKE EDUCATION PROVIDED/REVIEWED AND GIVEN TO PATIENT Stroke warning signs and symptoms How to activate emergency medical system (call 911). Medications prescribed at discharge. Need for follow-up after discharge. Personal risk factors for stroke. Pneumonia vaccine given: No Flu vaccine given: No My questions have been answered, the writing is legible, and I understand these instructions.  I will adhere to these goals & educational materials that have been provided to me after my discharge from the hospital.
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
## 2024-07-13 NOTE — PMR Pre-admission (Signed)
 PMR Admission Coordinator Pre-Admission Assessment  Patient: Cassidy Bruce is an 28 y.o., female MRN: [id number] DOB: February 15, 1996 Height: 5' 4 (162.6 cm) Weight: 65.8 kg  Insurance Information HMO:     PPO:      PCP:      IPA:      80/20:      OTHER:  PRIMARY: Medicaid Pending      Policy#:       Subscriber:  CM Name:       Phone#:      Fax#:  Pre-Cert#:       Employer:  Benefits:  Phone #:      Name:  Eff. Date:      Deduct:       Out of Pocket Max:       Life Max:  CIR:       SNF:  Outpatient:      Co-Pay:  Home Health:       Co-Pay:  DME:      Co-Pay:  Providers:  SECONDARY:       Policy#:      Phone#:   Artist:       Phone#:   The Data processing manager" for patients in Inpatient Rehabilitation Facilities with attached "Privacy Act Statement-Health Care Records" was provided and verbally reviewed with: Patient and Family  Emergency Contact Information Contact Information     Name Relation Home Work Mobile   Linden   [phone]      Other Contacts     Name Relation Home Work Mobile   Wayne Aunt   [phone]       Current Medical History  Patient Admitting Diagnosis: CVA  History of Present Illness: Cassidy Bruce is a 28 y/o female with PMH ADHD/depression who presented to [facility] on 07/05/2024 with dizziness, slurred speech, headache, gait abnormality, as well as left side weakness.  Cranial CT scan showed no acute intracranial abnormality but new right cerebellar infarct since prior study of 2021 felt to be possibly chronic.  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  Distal V2 segment reconstitution but with attenuated enhancement relative to the contralateral side along the remainder of its course.  A 13 mL region of ischemia affecting both cerebellar hemispheres, worse on the left with no core infarct.  MRI identified acute infarcts within the cerebellar vermis and bilateral cerebellar hemispheres.   Most notably, large acute infarct present within the superior cerebellar artery territories bilaterally.  Posterior fossa mass effect without cerebellar tonsillar herniation or evidence of obstructive hydrocephalus.  Petechial hemorrhage within the cerebellar vermis and superior right cerebellar hemisphere.  Patient did not receive TNK.  No thrombectomy needed for left VA occlusion as BA was patent.  Admission chemistries unremarkable except potassium 3.2, glucose 123, ALT 58, CK 284, urine drug screen negative, lactic acid within normal limits.  Echocardiogram with ejection fraction of 60 to 65% no regional wall motion abnormalities.  EEG negative for seizure.  TEE completed bubble study with small PFO, at rest LTR shunting and with Valsalva RTL shunting and plan for follow-up outpatient with cardiology services.  Venous Doppler studies negative for DVT.  Patient initially placed on heparin  drip 9/8 x 48 hours transition to aspirin  81 mg daily and Plavix  75 mg daily for 3 months then aspirin  alone.  Psychiatry consulted 07/13/2024 for history of major depressive disorder.  Patient is tolerating a regular consistency diet.  Therapy evaluations completed and pt was recommended for a comprehensive rehab program.  Complete NIHSS TOTAL: 4  Patient's medical record from [facility] has been reviewed by the rehabilitation admission coordinator and physician.  Past Medical History  Past Medical History:  Diagnosis Date   ADHD    Depression     Has the patient had major surgery during 100 days prior to admission? No  Family History   family history includes CAD in an other family member; Cancer in an other family member; Diabetes in an other family member; Healthy in her mother.  Current Medications  Current Facility-Administered Medications:    0.9 %  sodium chloride  infusion, , Intravenous, Continuous, Khaliqdina, Salman, MD, Last Rate: 100 mL/hr at 07/12/24 1214, New Bag at 07/12/24 1214    acetaminophen  (TYLENOL ) tablet 1,000 mg, 1,000 mg, Oral, Q8H, Lonni Slain, MD, 1,000 mg at 07/12/24 2147   aspirin  EC tablet 81 mg, 81 mg, Oral, Daily, Lonni Slain, MD, 81 mg at 07/13/24 9083   butalbital -acetaminophen -caffeine  (FIORICET ) 50-325-40 MG per tablet 1 tablet, 1 tablet, Oral, Q8H PRN, Lonni Slain, MD, 1 tablet at 07/10/24 9177   citalopram  (CELEXA ) tablet 20 mg, 20 mg, Oral, Daily, Perri DELENA Meliton Mickey., MD, 20 mg at 07/13/24 9083   clopidogrel  (PLAVIX ) tablet 75 mg, 75 mg, Oral, Daily, Reome, Earle J, RPH, 75 mg at 07/13/24 9083   ferrous sulfate  tablet 325 mg, 325 mg, Oral, QODAY, Perri DELENA Meliton Mickey., MD, 325 mg at 07/12/24 1215   heparin  ADULT infusion 100 units/mL (25000 units/250mL), 850 Units/hr, Intravenous, Continuous, Paytes, Austin A, RPH, Last Rate: 8.5 mL/hr at 07/13/24 0916, 850 Units/hr at 07/13/24 0916   ondansetron  (ZOFRAN ) injection 4 mg, 4 mg, Intravenous, Q6H PRN, Lonni Slain, MD, 4 mg at 07/10/24 1642   Oral care mouth rinse, 15 mL, Mouth Rinse, PRN, Perri DELENA Meliton Mickey., MD   oxyCODONE  (Oxy IR/ROXICODONE ) immediate release tablet 5 mg, 5 mg, Oral, Q4H PRN, Lonni Slain, MD, 5 mg at 07/09/24 1658   polyethylene glycol (MIRALAX  / GLYCOLAX ) packet 17 g, 17 g, Oral, BID, Perri DELENA Meliton Mickey., MD, 17 g at 07/10/24 0815   rosuvastatin  (CRESTOR ) tablet 20 mg, 20 mg, Oral, Daily, Lonni Slain, MD, 20 mg at 07/13/24 9083   thiamine  (VITAMIN B1) tablet 100 mg, 100 mg, Oral, Daily, Pham, Minh Q, RPH-CPP, 100 mg at 07/13/24 9083  Patients Current Diet:  Diet Order             Diet regular Room service appropriate? Yes with Assist; Fluid consistency: Thin  Diet effective now                   Precautions / Restrictions Precautions Precautions: Fall Restrictions Weight Bearing Restrictions Per Provider Order: No   Has the patient had 2 or more falls or a fall with injury in the past year?  No  Prior Activity Level Community (5-7x/wk): independent, working, driving  Prior Functional Level Self Care: Did the patient need help bathing, dressing, using the toilet or eating? Independent  Indoor Mobility: Did the patient need assistance with walking from room to room (with or without device)? Independent  Stairs: Did the patient need assistance with internal or external stairs (with or without device)? Independent  Functional Cognition: Did the patient need help planning regular tasks such as shopping or remembering to take medications? Independent  Patient Information Are you of Hispanic, Latino/a,or Spanish origin?: A. No, not of Hispanic, Latino/a, or Spanish origin What is your race?: B. Black or African American Do you need or want  an interpreter to communicate with a doctor or health care staff?: 0. No  Patient's Response To:  Health Literacy and Transportation Is the patient able to respond to health literacy and transportation needs?: Yes Health Literacy - How often do you need to have someone help you when you read instructions, pamphlets, or other written material from your doctor or pharmacy?: Never In the past 12 months, has lack of transportation kept you from medical appointments or from getting medications?: No In the past 12 months, has lack of transportation kept you from meetings, work, or from getting things needed for daily living?: No  Home Assistive Devices / Equipment Home Equipment: None  Prior Device Use: Indicate devices/aids used by the patient prior to current illness, exacerbation or injury? None of the above  Current Functional Level Cognition  Arousal/Alertness: Awake/alert Overall Cognitive Status: Impaired/Different from baseline Orientation Level: Oriented X4 Attention: Sustained Sustained Attention: Impaired Sustained Attention Impairment: Verbal basic Memory: Impaired Memory Impairment: Other (comment) (working memory) Awareness:  Impaired Awareness Impairment: Intellectual impairment, Other (comment) (online) Problem Solving: Impaired Problem Solving Impairment: Verbal complex Executive Function: Self Monitoring Self Monitoring: Impaired Self Monitoring Impairment: Verbal basic Safety/Judgment: Impaired    Extremity Assessment (includes Sensation/Coordination)  Upper Extremity Assessment: LUE deficits/detail RUE Deficits / Details: ataxic however imporved motor control to feed self apple sauce and donn/doff socks with assist RUE Sensation: decreased light touch RUE Coordination: decreased fine motor LUE Deficits / Details: stronger and increased ROM proximally - able to comlet shoulder flexion/elbow flex/ext WFL with gravity eliminated; gross grasp but unable to make full fist; did not appear to demonstrate in-hand manipulation skills (unable to grasp sock) decreasing functional use of hand LUE Sensation: WNL LUE Coordination: decreased fine motor, decreased gross motor  Lower Extremity Assessment: Defer to PT evaluation RLE Deficits / Details: ataxic BLE's R>L, strength grossly 4/5 but with decreased ability to sustain a consistent contraction, pt with extraneous non purposeful movements when trying to move with control RLE Sensation: decreased proprioception RLE Coordination: decreased fine motor, decreased gross motor LLE Deficits / Details: strength was 4/5 throughout but with decreased ability to sustain a consistent contraction, ataxic but has more control L LE than R LLE Sensation: decreased proprioception LLE Coordination: decreased fine motor, decreased gross motor    ADLs  Overall ADL's : Needs assistance/impaired Eating/Feeding: Minimal assistance (NPO however ST gave OK for pt to eat applesauce) Grooming: Minimal assistance Upper Body Bathing: Bed level, Minimal assistance Lower Body Bathing: Moderate assistance Lower Body Bathing Details (indicate cue type and reason): more with standing  bathing. Upper Body Dressing : Sitting, Moderate assistance Lower Body Dressing: Moderate assistance Toilet Transfer: Moderate assistance, Stand-pivot, BSC/3in1, Rolling walker (2 wheels) Toileting- Clothing Manipulation and Hygiene: Maximal assistance, Sit to/from stand Toileting - Clothing Manipulation Details (indicate cue type and reason): max A for balance while pt performed pericare. Functional mobility during ADLs: Moderate assistance    Mobility  Overal bed mobility: Needs Assistance Bed Mobility: Supine to Sit Rolling: Contact guard assist, Used rails Sidelying to sit: Supervision Supine to sit: Min assist Sit to supine: Min assist General bed mobility comments: MinA for balance when raising trunk    Transfers  Overall transfer level: Needs assistance Equipment used: Rolling walker (2 wheels) Transfers: Sit to/from Stand Sit to Stand: Min assist Bed to/from chair/wheelchair/BSC transfer type:: Step pivot Step pivot transfers: Mod assist General transfer comment: Good power up, slight posterior LOB upon initial standing requiring minA    Ambulation / Gait /  Stairs / Psychologist, prison and probation services  Ambulation/Gait Ambulation/Gait assistance: Mod assist, +2 safety/equipment Gait Distance (Feet): 100 Feet Assistive device: Rolling walker (2 wheels) Gait Pattern/deviations: Ataxic, Narrow base of support, Step-through pattern General Gait Details: Good posture, up to modA for postural stability and with obstacle negotiation Gait velocity: decreased Gait velocity interpretation: <1.8 ft/sec, indicate of risk for recurrent falls Pre-gait activities: wt shifting in standing Stairs: Yes Stairs assistance: Contact guard assist Stair Management: Two rails Number of Stairs: 10    Posture / Balance Dynamic Sitting Balance Sitting balance - Comments: stable with bilat UE support, otherwise mutlidirecitonal swaying. Balance Overall balance assessment: Needs assistance Sitting-balance  support: Feet supported Sitting balance-Leahy Scale: Fair Sitting balance - Comments: stable with bilat UE support, otherwise mutlidirecitonal swaying. Standing balance support: Bilateral upper extremity supported, During functional activity, Reliant on assistive device for balance Standing balance-Leahy Scale: Poor Standing balance comment: Reliant on UE support and mod A    Special considerations/life events  Special service needs neuropsych   Previous Home Environment (from acute therapy documentation) Living Arrangements: Non-relatives/Friends Available Help at Discharge: Friend(s), Available PRN/intermittently Type of Home: Apartment (apt style hotel) Home Layout: One level Home Access: Stairs to enter Entrance Stairs-Rails: Can reach both, Right, Left Entrance Stairs-Number of Steps: 2 flights Bathroom Shower/Tub: Engineer, manufacturing systems: Standard Additional Comments: Pt slid OOB before EMS came  Discharge Living Setting Plans for Discharge Living Setting: Patient's home, Lives with (comment) (roommate, Zenobia) Type of Home at Discharge: Apartment Discharge Home Layout: One level Discharge Home Access: Stairs to enter Entrance Stairs-Rails: Can reach both Entrance Stairs-Number of Steps: 2 flights Discharge Bathroom Shower/Tub: Tub/shower unit Discharge Bathroom Toilet: Standard Discharge Bathroom Accessibility: Yes How Accessible: Accessible via walker  Social/Family/Support Systems Anticipated Caregiver: roommate, 2 aunts Anticipated Caregiver's Contact Information: primary contacts: Zenobia  [phone] (roommate), and Shanda (aunt) [phone] Ability/Limitations of Caregiver: will need to come up with a schedule for 24/7 supervision Caregiver Availability: 24/7 Discharge Plan Discussed with Primary Caregiver: Yes Is Caregiver In Agreement with Plan?: Yes  Goals Patient/Family Goal for Rehab: PT/OT/SLP supervision Expected length of stay: 12-16  days Additional Information: Discharge plan: home with family and friends providing caregiver support, expect supervision level goals and Zenobia/Vanessa aware of need for 24/7 initially Pt/Family Agrees to Admission and willing to participate: Yes Program Orientation Provided & Reviewed with Pt/Caregiver Including Roles  & Responsibilities: Yes  Decrease burden of Care through IP rehab admission: n/a  Possible need for SNF placement upon discharge:  Not anticipated.  Alyse Rimes working on transitioning to [employer] apartment, will have support from Zenobia and her two Aunts Vanessa and [place].    Patient Condition: I have reviewed medical records from [facility], spoken with [facility] team, and patient and family member. I met with patient at the bedside for inpatient rehabilitation assessment.  Patient will benefit from ongoing PT, OT, and SLP, can actively participate in 3 hours of therapy a day 5 days of the week, and can make measurable gains during the admission.  Patient will also benefit from the coordinated team approach during an Inpatient Acute Rehabilitation admission.  The patient will receive intensive therapy as well as Rehabilitation physician, nursing, social worker, and care management interventions.  Due to safety, skin/wound care, disease management, medication administration, pain management, and patient education the patient requires 24 hour a day rehabilitation nursing.  The patient is currently min to mod assist with mobility and basic ADLs.  Discharge setting and therapy post discharge at  home with home health is anticipated.  Patient has agreed to participate in the Acute Inpatient Rehabilitation Program and will admit today.  Preadmission Screen Completed By:  Reche FORBES Lowers, PT, DPT 07/13/2024 10:54 AM ______________________________________________________________________   Discussed status with Dr. Lorilee  on  07/13/24  at  10:54 AM  and received approval for admission  today.  Admission Coordinator:  Caitlin E Warren, PT, time 10:54 AM Pattricia 07/13/24    Assessment/Plan: Diagnosis: CVA Does the need for close, 24 hr/day Medical supervision in concert with the patient's rehab needs make it unreasonable for this patient to be served in a less intensive setting? Yes Co-Morbidities requiring supervision/potential complications: ADHD, depression, dizziness, slurred speech, headache Due to bladder management, bowel management, safety, skin/wound care, disease management, medication administration, pain management, and patient education, does the patient require 24 hr/day rehab nursing? Yes Does the patient require coordinated care of a physician, rehab nurse, PT, OT, and SLP to address physical and functional deficits in the context of the above medical diagnosis(es)? Yes Addressing deficits in the following areas: balance, endurance, locomotion, strength, transferring, bowel/bladder control, bathing, dressing, feeding, grooming, toileting, speech, and psychosocial support Can the patient actively participate in an intensive therapy program of at least 3 hrs of therapy 5 days a week? Yes The potential for patient to make measurable gains while on inpatient rehab is excellent Anticipated functional outcomes upon discharge from inpatient rehab: min assist PT, min assist OT, min assist SLP Estimated rehab length of stay to reach the above functional goals is: 10-14 days Anticipated discharge destination: Home 10. Overall Rehab/Functional Prognosis: excellent   MD Signature: Sven Lorilee, MD

## 2024-07-13 NOTE — Progress Notes (Signed)
 Physical Therapy Treatment Patient Details Name: Cassidy Bruce MRN: [id number] DOB: [birth year] Today's Date: 07/13/2024   History of Present Illness Pt is a 28 yo female presenting to [facility] on 07/05/24 for dizziness, slurred speech, and impaired balance. MRI showed bilat cerebellar infarct with L>R. CTA head/neck showed proximal L vertebral artery dissection. PMH of ADHD, MDD.   PT Comments  Pt continues to make steady progress towards acute PT goals. Pt tolerated >30 minutes of activity with x2 short seated rest breaks. Worked primarily on gait training, coordination, and dynamic balance in today's session. Continues to require ModA +2 for safety due to ataxic movements. Pt continues to be a great candidate for >3hrs post acute rehab with acute PT to follow.     If plan is discharge home, recommend the following: A lot of help with bathing/dressing/bathroom;Assistance with feeding;Assistance with cooking/housework;Assist for transportation;Help with stairs or ramp for entrance;A lot of help with walking and/or transfers   Can travel by private vehicle      No  Equipment Recommendations  Other (comment) (TBD)       Precautions / Restrictions Precautions Precautions: Fall Recall of Precautions/Restrictions: Impaired Restrictions Weight Bearing Restrictions Per Provider Order: No     Mobility  Bed Mobility Overal bed mobility: Needs Assistance Bed Mobility: Supine to Sit     Supine to sit: Contact guard Transfers Overall transfer level: Needs assistance Equipment used: 1 person hand held assist, 2 person hand held assist Transfers: Sit to/from Stand Sit to Stand: Mod assist, +2 safety/equipment      General transfer comment: MinA to ModA for steadying assist and slight boost-up, +2 safety    Ambulation/Gait Ambulation/Gait assistance: Mod assist, +2 safety/equipment   Assistive device: 2 person hand held assist Gait Pattern/deviations: Ataxic, Narrow base of support,  Step-through pattern Gait velocity: decreased     General Gait Details: ataxic gait pattern with tendency to have narrow BOS with LE's scissoring    Modified Rankin (Stroke Patients Only) Modified Rankin (Stroke Patients Only) Pre-Morbid Rankin Score: No symptoms Modified Rankin: Moderately severe disability     Balance Overall balance assessment: Needs assistance Sitting-balance support: Feet supported Sitting balance-Leahy Scale: Fair     Standing balance support: Bilateral upper extremity supported, During functional activity, Reliant on assistive device for balance Standing balance-Leahy Scale: Poor Standing balance comment: Reliant on UE support and mod A       Communication Communication Communication: Impaired Factors Affecting Communication: Difficulty expressing self;Reduced clarity of speech  Cognition Arousal: Alert Behavior During Therapy: WFL for tasks assessed/performed   PT - Cognitive impairments: Attention, Sequencing, Problem solving, Safety/Judgement    Following commands: Impaired Following commands impaired: Follows multi-step commands inconsistently, Only follows one step commands consistently    Cueing Cueing Techniques: Verbal cues, Tactile cues  Exercises Other Exercises Other Exercises: Bx8 step to cone, Bx8 step over cone with forward weight-shift        Pertinent Vitals/Pain Pain Assessment Pain Assessment: No/denies pain Faces Pain Scale: No hurt     PT Goals (current goals can now be found in the care plan section) Acute Rehab PT Goals Patient Stated Goal: be independent PT Goal Formulation: With patient Time For Goal Achievement: 07/21/24 Potential to Achieve Goals: Good Progress towards PT goals: Progressing toward goals    Frequency    Min 3X/week           Co-evaluation   Reason for Co-Treatment: To address functional/ADL transfers;For patient/therapist safety PT goals addressed during session: Mobility/safety  with mobility;Balance        AM-PAC PT 6 Clicks Mobility   Outcome Measure  Help needed turning from your back to your side while in a flat bed without using bedrails?: A Little Help needed moving from lying on your back to sitting on the side of a flat bed without using bedrails?: A Little Help needed moving to and from a bed to a chair (including a wheelchair)?: A Little Help needed standing up from a chair using your arms (e.g., wheelchair or bedside chair)?: A Lot Help needed to walk in hospital room?: A Lot Help needed climbing 3-5 steps with a railing? : A Lot 6 Click Score: 15    End of Session Equipment Utilized During Treatment: Gait belt Activity Tolerance: Patient tolerated treatment well Patient left: with call bell/phone within reach;in chair;with chair alarm set Nurse Communication: Mobility status PT Visit Diagnosis: Unsteadiness on feet (R26.81);Ataxic gait (R26.0);Difficulty in walking, not elsewhere classified (R26.2)     Time: 8891-8859 PT Time Calculation (min) (ACUTE ONLY): 32 min  Charges:    $Therapeutic Activity: 8-22 mins PT General Charges $$ ACUTE PT VISIT: 1 Visit                    Kate ORN, PT, DPT Secure Chat Preferred  Rehab Office [phone]   [name] [name] 07/13/2024, 11:59 AM

## 2024-07-13 NOTE — Progress Notes (Signed)
 Occupational Therapy Treatment Patient Details Name: Cassidy Bruce MRN: [id number] DOB: 16-Jun-1996 Today's Date: 07/13/2024   History of present illness Pt is a 28 yo female presenting to [facility] on 07/05/24 for dizziness, slurred speech, and impaired balance. MRI showed bilat cerebellar infarct with L>R. CTA head/neck showed proximal L vertebral artery dissection. PMH of ADHD, MDD.   OT comments  Pt continuing to progress towards pt focused goals, Pt demonstrating ability to perform more fine controlled movements with increased concentration and effort. Her BUEs remain ataxic with RUE better than LUE, pt using RUE functionally for more tasks. Encouraged control with proper movements patterns vs pt using RUE to compensate for lack of LUE mobility. OT to continue following pt acutely to progress pt as able. Patient has the potential to reach Mod I and demos the ability to tolerate 3 hours of therapy. Pt would benefit from an intensive rehab program to help maximize functional independence.       If plan is discharge home, recommend the following:  A lot of help with walking and/or transfers;A lot of help with bathing/dressing/bathroom;Assistance with cooking/housework;Help with stairs or ramp for entrance;Assist for transportation   Equipment Recommendations  BSC/3in1;Tub/shower bench    Recommendations for Other Services      Precautions / Restrictions Precautions Precautions: Fall Recall of Precautions/Restrictions: Impaired Restrictions Weight Bearing Restrictions Per Provider Order: No       Mobility Bed Mobility Overal bed mobility: Needs Assistance Bed Mobility: Supine to Sit     Supine to sit: Contact guard          Transfers Overall transfer level: Needs assistance Equipment used: 1 person hand held assist, 2 person hand held assist Transfers: Sit to/from Stand Sit to Stand: Mod assist, +2 safety/equipment           General transfer comment: MinA to ModA for  steadying assist and slight boost-up, +2 safety     Balance Overall balance assessment: Needs assistance Sitting-balance support: Feet supported Sitting balance-Leahy Scale: Fair     Standing balance support: Bilateral upper extremity supported, During functional activity, Reliant on assistive device for balance Standing balance-Leahy Scale: Poor Standing balance comment: Reliant on UE support and mod A                           ADL either performed or assessed with clinical judgement   ADL Overall ADL's : Needs assistance/impaired     Grooming: Oral care;Sitting;Minimal assistance Grooming Details (indicate cue type and reason): cues for LUE slow and controlled movements during elbow ROM             Lower Body Dressing: Moderate assistance;Bed level Lower Body Dressing Details (indicate cue type and reason): donned underwear, some assist to pull them up but pt able to bridge hips.             Functional mobility during ADLs: +2 for physical assistance;+2 for safety/equipment;Moderate assistance      Extremity/Trunk Assessment Upper Extremity Assessment RUE Deficits / Details: ataxic but better FTN coordination than compared to L, using it more functionally to brush teeth. RUE Coordination: decreased fine motor LUE Deficits / Details: Able to achieve L shoulder flexion AROM above head with increased effort and time. limited grasp but improved with increased effort. No in-hand manipulation skills noted at this time. LUE Coordination: decreased fine motor;decreased gross motor            Vision  Perception     Praxis     Communication Communication Communication: Impaired Factors Affecting Communication: Difficulty expressing self;Reduced clarity of speech   Cognition Arousal: Alert Behavior During Therapy: Reception [facility] for tasks assessed/performed   Difficult to assess due to: Impaired communication   Awareness: Intellectual awareness intact,  Online awareness impaired     Executive functioning impairment (select all impairments): Problem solving OT - Cognition Comments: needs further assessment.                 Following commands: Impaired Following commands impaired: Follows multi-step commands inconsistently, Only follows one step commands consistently      Cueing   Cueing Techniques: Verbal cues, Tactile cues  Exercises Other Exercises Other Exercises: Bx8 step to cone, Bx8 step over cone with forward weight-shift Other Exercises: Dynamic/crossbody reaching RUE/LUE sitting in recliner Other Exercises: isolated digit ext LUE with palm down.    Shoulder Instructions       General Comments      Pertinent Vitals/ Pain       Pain Assessment Pain Assessment: No/denies pain  Home Living                                          Prior Functioning/Environment              Frequency  Min 3X/week        Progress Toward Goals  OT Goals(current goals can now be found in the care plan section)        Plan      Co-evaluation      Reason for Co-Treatment: To address functional/ADL transfers;For patient/therapist safety PT goals addressed during session: Mobility/safety with mobility;Balance OT goals addressed during session: ADL's and self-care;Strengthening/ROM      AM-PAC OT 6 Clicks Daily Activity     Outcome Measure   Help from another person eating meals?: A Lot Help from another person taking care of personal grooming?: A Little Help from another person toileting, which includes using toliet, bedpan, or urinal?: A Lot Help from another person bathing (including washing, rinsing, drying)?: A Lot Help from another person to put on and taking off regular upper body clothing?: A Lot Help from another person to put on and taking off regular lower body clothing?: A Lot 6 Click Score: 13    End of Session Equipment Utilized During Treatment: Gait belt  OT Visit  Diagnosis: Unsteadiness on feet (R26.81);Other abnormalities of gait and mobility (R26.89);Other symptoms and signs involving the nervous system (R29.898);Ataxia, unspecified (R27.0);Pain   Activity Tolerance Patient tolerated treatment well   Patient Left in chair;with call bell/phone within reach;with chair alarm set   Nurse Communication Mobility status        Time: 8891-8857 OT Time Calculation (min): 34 min  Charges: OT General Charges $OT Visit: 1 Visit OT Treatments $Therapeutic Activity: 8-22 mins  07/13/2024  AB, OTR/L  Acute Rehabilitation Services  Office: [phone]   Cassidy Bruce 07/13/2024, 12:49 PM

## 2024-07-13 NOTE — H&P (Signed)
 Physical Medicine and Rehabilitation Admission H&P    CC: Acute infarcts within the cerebellar vermis and bilateral cerebellar hemispheres  HPI: Cassidy Bruce is a 28 year old right-handed African-American female with past medical history of ADHD/depression.  On the prescription medications other than birth control.  Per chart review patient lives with a friend.  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  They live in a second floor apartment.  Plans to discharge home with friend and 2 aunts with good support.  Family is checking on ground availability apartments.  Presented 07/05/2024 with dizziness, slurred speech headache and gait abnormality as well as left side weakness.  Per family she had went to the gym on Monday, 07/04/2024 for weightlifting as usual no specific problems.  Tuesday morning she awoke from sleep with headache and neck pain and by the afternoon patient with increasing dizziness/vertigo as well as slurred speech.  Cranial CT scan showed no acute intracranial abnormality new right cerebellar infarct since prior study of 2021 felt to be possibly chronic.  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  Distal V2 segment reconstitution but with attenuated enhancement relative to the contralateral side along the remainder of its course.  A 13 mL region of ischemia affecting both cerebellar hemispheres, worse on the left with no core infarct.  MRI identified acute infarcts within the cerebellar vermis and bilateral cerebellar hemispheres.  Most notably, large acute infarct present within the superior cerebellar artery territories bilaterally.  Posterior fossa mass effect without cerebellar tonsillar herniation or evidence of obstructive hydrocephalus.  Petechial hemorrhage within the cerebellar vermis and superior right cerebellar hemisphere.  Patient did not receive TNK.  No thrombectomy needed for left VA occlusion as BA was patent.  Admission  chemistries unremarkable except potassium 3.2, glucose 123, ALT 58, CK 284, urine drug screen negative, lactic acid within normal limits.  Echocardiogram with ejection fraction of 60 to 65% no regional wall motion abnormalities.  EEG negative for seizure.  TEE completed bubble study with small PFO, at rest LTR shunting and with Valsalva RTL shunting and plan for follow-up outpatient with cardiology services.  Venous Doppler studies negative for DVT.  Patient initially placed on heparin  drip 9/8 x 48 hours transition to aspirin  81 mg daily and Plavix  75 mg daily for 3 months then aspirin  alone.  Psychiatry consulted 07/13/2024 for history of major depressive disorder.  Patient is tolerating a regular consistency diet.  Therapy evaluations completed due to patient decreased functional mobility was admitted for a comprehensive rehab program. Patient presents with impaired speech.  Review of Systems  Constitutional:  Negative for chills and fever.  HENT:  Negative for hearing loss.   Eyes:  Negative for blurred vision and double vision.  Respiratory:  Negative for cough, shortness of breath and wheezing.   Cardiovascular:  Negative for chest pain, palpitations and leg swelling.  Gastrointestinal:  Positive for constipation. Negative for heartburn, nausea and vomiting.  Genitourinary:  Negative for dysuria, flank pain and hematuria.  Skin:  Negative for rash.  Neurological:  Positive for dizziness, sensory change, speech change, focal weakness, weakness and headaches.  Psychiatric/Behavioral:  Positive for depression.   All other systems reviewed and are negative.  Past Medical History:  Diagnosis Date   ADHD    Depression    Past Surgical History:  Procedure Laterality Date   TRANSESOPHAGEAL ECHOCARDIOGRAM (CATH LAB) N/A 07/07/2024   Procedure: TRANSESOPHAGEAL ECHOCARDIOGRAM;  Surgeon: Lonni Slain, MD;  Location: [facility] INVASIVE  CV LAB;  Service: Cardiovascular;  Laterality: N/A;   Family  History  Problem Relation Age of Onset   Cancer Other    Diabetes Other    CAD Other    Healthy Mother    Social History:  reports that she has never smoked. She has never used smokeless tobacco. She reports that she does not drink alcohol and does not use drugs. Allergies: No Known Allergies Medications Prior to Admission  Medication Sig Dispense Refill   acetaminophen  (TYLENOL ) 500 MG tablet Take 500 mg by mouth every 6 (six) hours as needed.     [START ON 07/14/2024] aspirin  EC 81 MG tablet Take 1 tablet (81 mg total) by mouth daily. Swallow whole.     [START ON 07/14/2024] citalopram  (CELEXA ) 20 MG tablet Take 1 tablet (20 mg total) by mouth daily.     [START ON 07/14/2024] clopidogrel  (PLAVIX ) 75 MG tablet Take 1 tablet (75 mg total) by mouth daily. 89 tablet 0   [START ON 07/14/2024] ferrous sulfate  325 (65 FE) MG tablet Take 1 tablet (325 mg total) by mouth every other day.     [START ON 07/14/2024] rosuvastatin  (CRESTOR ) 20 MG tablet Take 1 tablet (20 mg total) by mouth daily.      Home: Home Living Family/patient expects to be discharged to:: Private residence Living Arrangements: Non-relatives/Friends Available Help at Discharge: Friend(s), Available PRN/intermittently Type of Home: Apartment (apt style hotel) Home Access: Stairs to enter Secretary/administrator of Steps: 2 flights Entrance Stairs-Rails: Can reach both, Right, Left Home Layout: One level Bathroom Shower/Tub: Engineer, manufacturing systems: Standard Home Equipment: None Additional Comments: Pt slid OOB before EMS came   Functional History: Prior Function Prior Level of Function : Independent/Modified Independent, History of Falls (last six months), Working/employed, Driving Mobility Comments: ind no AD, 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 ADLs Comments: ind   Functional Status:  Mobility: Bed Mobility Overal bed mobility: Needs Assistance Bed Mobility: Supine to Sit Rolling: Contact guard assist, Used  rails Sidelying to sit: Supervision Supine to sit: Contact guard Sit to supine: Min assist General bed mobility comments: MinA for balance when raising trunk Transfers Overall transfer level: Needs assistance Equipment used: 1 person hand held assist, 2 person hand held assist Transfers: Sit to/from Stand Sit to Stand: Mod assist, +2 safety/equipment Bed to/from chair/wheelchair/BSC transfer type:: Step pivot Step pivot transfers: Mod assist General transfer comment: MinA to ModA for steadying assist and slight boost-up, +2 safety Ambulation/Gait Ambulation/Gait assistance: Mod assist, +2 safety/equipment Gait Distance (Feet): 100 Feet Assistive device: 2 person hand held assist Gait Pattern/deviations: Ataxic, Narrow base of support, Step-through pattern General Gait Details: ataxic gait pattern with tendency to have narrow BOS with LE's scissoring Gait velocity: decreased Gait velocity interpretation: <1.8 ft/sec, indicate of risk for recurrent falls Pre-gait activities: wt shifting in standing Stairs: Yes Stairs assistance: Contact guard assist Stair Management: Two rails Number of Stairs: 10   ADL: ADL Overall ADL's : Needs assistance/impaired Eating/Feeding: Minimal assistance (NPO however ST gave OK for pt to eat applesauce) Grooming: Oral care, Sitting, Minimal assistance Grooming Details (indicate cue type and reason): cues for LUE slow and controlled movements during elbow ROM Upper Body Bathing: Bed level, Minimal assistance Lower Body Bathing: Moderate assistance Lower Body Bathing Details (indicate cue type and reason): more with standing bathing. Upper Body Dressing : Sitting, Moderate assistance Lower Body Dressing: Moderate assistance, Bed level Lower Body Dressing Details (indicate cue type and reason): donned underwear, some  assist to pull them up but pt able to bridge hips. Toilet Transfer: Moderate assistance, Stand-pivot, BSC/3in1, Rolling walker (2  wheels) Toileting- Clothing Manipulation and Hygiene: Maximal assistance, Sit to/from stand Toileting - Clothing Manipulation Details (indicate cue type and reason): max A for balance while pt performed pericare. Functional mobility during ADLs: +2 for physical assistance, +2 for safety/equipment, Moderate assistance   Cognition: Cognition Overall Cognitive Status: Impaired/Different from baseline Arousal/Alertness: Awake/alert Orientation Level: Oriented X4 Attention: Sustained Sustained Attention: Impaired Sustained Attention Impairment: Verbal basic Memory: Impaired Memory Impairment: Other (comment) (working memory) Awareness: Impaired Awareness Impairment: Intellectual impairment, Other (comment) (online) Problem Solving: Impaired Problem Solving Impairment: Verbal complex Executive Function: Self Monitoring Self Monitoring: Impaired Self Monitoring Impairment: Verbal basic Safety/Judgment: Impaired Cognition Arousal: Alert Behavior During Therapy: WFL for tasks assessed/performed Overall Cognitive Status: Impaired/Different from baseline    Physical Exam: Blood pressure 117/85, pulse (!) 52, temperature 98 F (36.7 C), temperature source Oral, resp. rate 18, height 5' 4 (1.626 m), weight 65.3 kg, SpO2 99%. Gen: no distress, normal appearing HEENT: oral mucosa pink and moist, NCAT Cardio: Reg rate Chest: normal effort, normal rate of breathing Abd: soft, non-distended Ext: no edema Psych: pleasant, normal affect Skin: intact Neurological:     Comments: Patient is alert.  Sitting up in bed.  Makes eye contact with examiner.  Follows simple commands.  Differentiates left from right.  Pseudo bulbar speech dysarthria. LUE 4/5 throughout, strength is otherwise intact, sensation is intact   Results for orders placed or performed during the hospital encounter of 07/05/24 (from the past 48 hours)  Heparin  level (unfractionated)     Status: Abnormal   Collection Time:  07/11/24 10:56 PM  Result Value Ref Range   Heparin  Unfractionated 0.20 (L) 0.30 - 0.70 IU/mL    Comment: (NOTE) The clinical reportable range upper limit is being lowered to >1.10 to align with the FDA approved guidance for the current laboratory assay.  If heparin  results are below expected values, and patient dosage has  been confirmed, suggest follow up testing of antithrombin III levels. Performed at [facility], [address] [place], [place] [address]   Heparin  level (unfractionated)     Status: None   Collection Time: 07/12/24  4:26 AM  Result Value Ref Range   Heparin  Unfractionated 0.42 0.30 - 0.70 IU/mL    Comment: (NOTE) The clinical reportable range upper limit is being lowered to >1.10 to align with the FDA approved guidance for the current laboratory assay.  If heparin  results are below expected values, and patient dosage has  been confirmed, suggest follow up testing of antithrombin III levels. Performed at [facility], [address], [place], [place] [address]   CBC with Differential/Platelet     Status: Abnormal   Collection Time: 07/12/24  4:26 AM  Result Value Ref Range   WBC 6.5 4.0 - 10.5 K/uL   RBC 4.72 3.87 - 5.11 MIL/uL   Hemoglobin 12.1 12.0 - 15.0 g/dL   HCT 63.2 63.9 - 53.9 %   MCV 77.8 (L) 80.0 - 100.0 fL   MCH 25.6 (L) 26.0 - 34.0 pg   MCHC 33.0 30.0 - 36.0 g/dL   RDW 85.1 88.4 - 84.4 %   Platelets 119 (L) 150 - 400 K/uL   nRBC 0.0 0.0 - 0.2 %   Neutrophils Relative % 57 %   Neutro Abs 3.7 1.7 - 7.7 K/uL   Lymphocytes Relative 29 %   Lymphs Abs  1.9 0.7 - 4.0 K/uL   Monocytes Relative 10 %   Monocytes Absolute 0.7 0.1 - 1.0 K/uL   Eosinophils Relative 3 %   Eosinophils Absolute 0.2 0.0 - 0.5 K/uL   Basophils Relative 1 %   Basophils Absolute 0.1 0.0 - 0.1 K/uL   Immature Granulocytes 0 %   Abs Immature Granulocytes 0.01 0.00 - 0.07 K/uL    Comment: Performed at [facility], [address], [place], [place]  [address]  Comprehensive metabolic panel with GFR     Status: Abnormal   Collection Time: 07/12/24  4:26 AM  Result Value Ref Range   Sodium 142 135 - 145 mmol/L   Potassium 3.7 3.5 - 5.1 mmol/L   Chloride 111 98 - 111 mmol/L   CO2 21 (L) 22 - 32 mmol/L   Glucose, Bld 77 70 - 99 mg/dL    Comment: Glucose reference range applies only to samples taken after fasting for at least 8 hours.   BUN 9 6 - 20 mg/dL   Creatinine, Ser 9.27 0.44 - 1.00 mg/dL   Calcium  8.4 (L) 8.9 - 10.3 mg/dL   Total Protein 6.0 (L) 6.5 - 8.1 g/dL   Albumin 3.1 (L) 3.5 - 5.0 g/dL   AST 48 (H) 15 - 41 U/L   ALT 55 (H) 0 - 44 U/L   Alkaline Phosphatase 58 38 - 126 U/L   Total Bilirubin 0.5 0.0 - 1.2 mg/dL   GFR, Estimated >39 >39 mL/min    Comment: (NOTE) Calculated using the CKD-EPI Creatinine Equation (2021)    Anion gap 10 5 - 15    Comment: Performed at [facility], [address] [place], [place] [address]  Magnesium      Status: None   Collection Time: 07/12/24  4:26 AM  Result Value Ref Range   Magnesium  1.8 1.7 - 2.4 mg/dL    Comment: Performed at [facility], [address] [place], [place] [address]  Phosphorus     Status: None   Collection Time: 07/12/24  4:26 AM  Result Value Ref Range   Phosphorus 3.0 2.5 - 4.6 mg/dL    Comment: Performed at [facility], [address] [place], [place] [address]  Heparin  level (unfractionated)     Status: None   Collection Time: 07/13/24  4:35 AM  Result Value Ref Range   Heparin  Unfractionated 0.53 0.30 - 0.70 IU/mL    Comment: (NOTE) The clinical reportable range upper limit is being lowered to >1.10 to align with the FDA approved guidance for the current laboratory assay.  If heparin  results are below expected values, and patient dosage has  been confirmed, suggest follow up testing of antithrombin III levels. Performed at [facility], [address] [place], [place] [address]   CBC     Status: Abnormal   Collection Time:  07/13/24  4:35 AM  Result Value Ref Range   WBC 5.9 4.0 - 10.5 K/uL   RBC 4.72 3.87 - 5.11 MIL/uL   Hemoglobin 12.3 12.0 - 15.0 g/dL   HCT 63.7 63.9 - 53.9 %   MCV 76.7 (L) 80.0 - 100.0 fL   MCH 26.1 26.0 - 34.0 pg   MCHC 34.0 30.0 - 36.0 g/dL   RDW 85.3 88.4 - 84.4 %   Platelets 116 (L) 150 - 400 K/uL   nRBC 0.0 0.0 - 0.2 %    Comment: Performed at [facility], [address] [place], [place] [address]   DG Swallowing Func-Speech Pathology Result Date: 07/12/2024 Table formatting from the original result was not included. Modified Barium Swallow Study Patient Details Name: Cassidy Bruce MRN: [id number] Date of Birth: 06/22/1996 Today's Date: 07/12/2024 HPI/PMH: HPI: Pt is a 28 yo female presenting to [facility] on 07/05/24 for dizziness, slurred speech, and impaired balance. MRI showed bilat cerebellar infarct with L>R. CTA head/neck showed proximal L vertebral artery dissection. Pt initially passed a swallow screen but was observed to have difficulty swallowing 9/7 and repeat swallow screen was not passed. PMH of ADHD, MDD. Clinical Impression: Clinical Impression: Pt presents with mild dysphagia. Oral phase fluctuates, at times occurring swiftly and with swift, complete posterior propulsion. Other times, her lingual transit is slow and she has more oral residue, although even when she only swallows a little bit of the bolus on the initial swallow, she uses additional swallows (usually 1-2) to effectively clear her oral cavity. There was a single instance of trace, transient aspiration on the initial spoonful of thin liquid (PAS 6, cleared with a spontaneous throat clear) that seemed to be more a result of reduced oral control, as pharyngeal phase otherwise was [facility] across the study. Recommend resuming regular solids and thin liquids. Factors that may increase risk of adverse event in presence of aspiration Noe & Lianne 2021): Factors that may increase risk of adverse event in presence of aspiration  Noe & Lianne 2021): Reduced cognitive function Recommendations/Plan: Swallowing Evaluation Recommendations Swallowing Evaluation Recommendations Recommendations: PO diet PO Diet Recommendation: Regular; Thin liquids (Level 0) Liquid Administration via: Cup; Straw Medication Administration: Whole meds with liquid Supervision: Patient able to self-feed; Intermittent supervision/cueing for swallowing strategies Swallowing strategies  : Slow rate; Small bites/sips Postural changes: Position pt fully upright for meals Oral care recommendations: Oral care BID (2x/day) Treatment Plan Treatment Plan Treatment recommendations: Therapy as outlined in treatment plan below Follow-up recommendations: Acute inpatient rehab (3 hours/day) Functional status assessment: Patient has had a recent decline in their functional status and demonstrates the ability to make significant improvements in function in a reasonable and predictable amount of time. Treatment frequency: Min 2x/week Treatment duration: 2 weeks Interventions: Aspiration precaution training; Patient/family education; Diet toleration management by SLP Recommendations Recommendations for follow up therapy are one component of a multi-disciplinary discharge planning process, led by the attending physician.  Recommendations may be updated based on patient status, additional functional criteria and insurance authorization. Assessment: Orofacial Exam: Orofacial Exam Oral Cavity: Oral Hygiene: WFL Oral Cavity - Dentition: Adequate natural dentition Orofacial Anatomy: WFL Oral Motor/Sensory Function: WFL Anatomy: Anatomy: WFL Boluses Administered: Boluses Administered Boluses Administered: Thin liquids (Level 0); Mildly thick liquids (Level 2, nectar thick); Puree; Solid  Oral Impairment Domain: Oral Impairment Domain Lip Closure: No labial escape Tongue control during bolus hold: Escape to lateral buccal cavity/floor of mouth Bolus preparation/mastication: Slow prolonged  chewing/mashing with complete recollection Bolus transport/lingual motion: Slow tongue motion Oral residue: Majority of bolus remaining (piecemeal) Location of oral residue : Tongue Initiation of pharyngeal swallow : Valleculae  Pharyngeal Impairment Domain: Pharyngeal Impairment Domain Soft palate elevation: No bolus between soft palate (SP)/pharyngeal wall (PW) Laryngeal elevation: Complete superior movement of thyroid cartilage with complete approximation of arytenoids to epiglottic petiole Anterior hyoid excursion: Partial anterior movement Epiglottic movement: Complete inversion Laryngeal vestibule closure: Complete, no air/contrast in laryngeal vestibule Pharyngeal stripping wave : Present - complete Pharyngeal contraction (A/P view only): N/A Pharyngoesophageal segment opening: Complete distension and complete duration, no obstruction of  flow Tongue base retraction: No contrast between tongue base and posterior pharyngeal wall (PPW) Pharyngeal residue: Complete pharyngeal clearance Location of pharyngeal residue: N/A  Esophageal Impairment Domain: Esophageal Impairment Domain Esophageal clearance upright position: Complete clearance, esophageal coating Pill: Pill Consistency administered: Thin liquids (Level 0) Thin liquids (Level 0): [facility] Penetration/Aspiration Scale Score: Penetration/Aspiration Scale Score 1.  Material does not enter airway: Mildly thick liquids (Level 2, nectar thick); Puree; Solid; Pill 6.  Material enters airway, passes BELOW cords then ejected out: Thin liquids (Level 0) (x1 on initial spoonful of thin liquid) Compensatory Strategies: Compensatory Strategies Compensatory strategies: No   General Information: Caregiver present: No  Diet Prior to this Study: NPO   Temperature : Normal   Respiratory Status: WFL   Supplemental O2: None (Room air)   History of Recent Intubation: No  Behavior/Cognition: Alert; Cooperative; Requires cueing Self-Feeding Abilities: Able to self-feed Baseline  vocal quality/speech: Other (comment) (monotone) Volitional Cough: Able to elicit Volitional Swallow: Able to elicit Exam Limitations: No limitations Goal Planning: Prognosis for improved oropharyngeal function: Good Barriers to Reach Goals: Cognitive deficits No data recorded Patient/Family Stated Goal: none stated Consulted and agree with results and recommendations: Patient Pain: Pain Assessment Pain Assessment: Faces Faces Pain Scale: 0 End of Session: Start Time:SLP Start Time (ACUTE ONLY): 1016 Stop Time: SLP Stop Time (ACUTE ONLY): 1030 Time Calculation:SLP Time Calculation (min) (ACUTE ONLY): 14 min Charges: SLP Evaluations $ SLP Speech Visit: 1 Visit SLP Evaluations $BSS Swallow: 1 Procedure $MBS Swallow: 1 Procedure $Speech Treatment for Individual: 1 Procedure SLP visit diagnosis: SLP Visit Diagnosis: Dysphagia, oral phase (R13.11) Past Medical History: Past Medical History: Diagnosis Date  ADHD   Depression  Past Surgical History: Past Surgical History: Procedure Laterality Date  TRANSESOPHAGEAL ECHOCARDIOGRAM (CATH LAB) N/A 07/07/2024  Procedure: TRANSESOPHAGEAL ECHOCARDIOGRAM;  Surgeon: Lonni Slain, MD;  Location: [facility] INVASIVE CV LAB;  Service: Cardiovascular;  Laterality: N/A; Leita SAILOR., M.A. CCC-SLP Acute Rehabilitation Services Office: [phone] Secure chat preferred 07/12/2024, 11:21 AM     Blood pressure 117/85, pulse (!) 52, temperature 98 F (36.7 C), temperature source Oral, resp. rate 18, height 5' 4 (1.626 m), weight 65.3 kg, SpO2 99%.  Medical Problem List and Plan: 1. 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  Plan repeat CTA 2-3 months to evaluate for improvement dissection  -patient may shower  -ELOS/Goals: 10-12 days MinA  Admit to CIR  2.  Antithrombotics: -DVT/anticoagulation:  Pharmaceutical: Heparin  x 48 hours  07/12/19/2025  -antiplatelet therapy: continue Aspirin  81 mg daily and Plavix  75 mg daily x 3 months then aspirin  alone  3. Pain Management: continue Oxycodone /Fioricet  as needed  4. Mood/Behavior/Sleep/MDD/ADHD: continue Celexa  20 mg daily.  Provide emotional support.  Psychiatry consulted  -antipsychotic agents: N/A  5. Neuropsych/cognition: This patient is not capable of making decisions on her own behalf.  6. Skin/Wound Care: Routine skin checks  7. Fluids/Electrolytes/Nutrition: Routine ins and outs with follow-up chemistries  8.  PFO.  TEE showed positive PFO, at rest LTR shunting and with Valsalva RTL shunting.  Follow-up outpatient cardiology service for question PFO closure  9.  Hyperlipidemia: continue Crestor  20 mg daily  10.  Microcytosis.  Low normal ferritin.  Placed on iron supplement  I have personally performed a face to face diagnostic evaluation, including, but not limited to relevant history and physical exam findings, of this patient and developed relevant assessment and plan.  Additionally, I  have reviewed and concur with the physician assistant's documentation above.  Toribio PARAS Angiulli, PA-C    Danyela Posas P Armend Hochstatter, MD 07/13/2024

## 2024-07-13 NOTE — Progress Notes (Signed)
 PHARMACY - ANTICOAGULATION CONSULT NOTE  Pharmacy Consult for Heparin  Indication: stroke  No Known Allergies  Patient Measurements: Height: 5' 4 (162.6 cm) Weight: 65.8 kg (145 lb) IBW/kg (Calculated) : 54.7 HEPARIN  DW (KG): 65.8  Vital Signs: Temp: 98 F (36.7 C) (09/10 0510) Temp Source: Axillary (09/09 2344) BP: 128/72 (09/10 0510) Pulse Rate: 49 (09/10 0510)  Labs: Recent Labs    07/11/24 0435 07/11/24 2256 07/12/24 0426 07/13/24 0435  HGB 13.6  --  12.1 12.3  HCT 41.0  --  36.7 36.2  PLT 142*  --  119* 116*  HEPARINUNFRC  --  0.20* 0.42 0.53  CREATININE 0.90  --  0.72  --     Estimated Creatinine Clearance: 98.6 mL/min (by C-G formula based on SCr of 0.72 mg/dL).   Medical History: Past Medical History:  Diagnosis Date   ADHD    Depression    Assessment: 28 year old female with CVA left vertebral artery dissection with bilateral SCA distribution cerebral infarct. No anticoagulation prior to admission. Pharmacy consulted for heparin .    Plan for 48hr heparin . Started 9/8 @ 15:15. Repeat HL therapeutic at 0.53 with heparin  running at 900 units/hour. CBC stable. Slightly above goal, though has been therapeutic on this rate. Will slightly decrease for rest of day with tentative plan to stop this afternoon.   Goal of Therapy:  Heparin  level 0.3-0.5 units/ml Monitor platelets by anticoagulation protocol: Yes   Plan:  Continue Heparin  at 900 units / hr- set to stop 9/10 15:15 (48h per neuro documentation)  Monitor daily heparin  level, CBC, signs/symptoms of bleeding  F/U start DVT ppx    Massie Fila, PharmD Clinical Pharmacist  07/13/2024 8:29 AM

## 2024-07-13 NOTE — Plan of Care (Signed)

## 2024-07-13 NOTE — Discharge Summary (Signed)
 Physician Discharge Summary  Patient ID: Cassidy Bruce MRN: [id number] DOB/AGE: 28/05/1996 28 y.o.  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 Modified Barium Swallow Study Patient Details Name: Cassidy Bruce MRN: [id number] Date of Birth: May 02, 1996 Today's Date: 07/12/2024 HPI/PMH: HPI: Pt is a 28 yo female presenting to [facility] on 07/05/24 for dizziness, slurred speech, and impaired balance. MRI showed bilat cerebellar infarct with L>R. CTA head/neck showed proximal L vertebral artery dissection. Pt initially passed a swallow screen but was observed to have difficulty swallowing 9/7 and repeat swallow screen was not passed. PMH of ADHD, MDD. Clinical Impression: Clinical Impression: Pt presents with mild dysphagia. Oral phase fluctuates, at times occurring swiftly and with swift, complete posterior propulsion. Other times, her lingual transit is slow and she has more oral residue, although even when she only swallows a little bit of the bolus on the initial swallow, she uses additional swallows (usually 1-2) to effectively clear her oral cavity. There was a single instance of trace, transient aspiration on the initial spoonful of thin liquid (PAS 6, cleared with a spontaneous throat clear) that seemed to be more a result of reduced oral control, as pharyngeal phase otherwise was [facility] across the study. Recommend resuming regular  solids and thin liquids. Factors that may increase risk of adverse event in presence of aspiration Noe & Lianne 2021): Factors that may increase risk of adverse event in presence of aspiration Noe & Lianne 2021): Reduced cognitive function Recommendations/Plan: Swallowing Evaluation Recommendations Swallowing Evaluation Recommendations Recommendations: PO diet PO Diet Recommendation: Regular; Thin liquids (Level 0) Liquid Administration via: Cup; Straw Medication Administration: Whole meds with liquid Supervision: Patient able to self-feed; Intermittent supervision/cueing for swallowing strategies Swallowing strategies  : Slow rate; Small bites/sips Postural changes: Position pt fully upright for meals Oral care recommendations: Oral care BID (2x/day) Treatment Plan Treatment Plan Treatment recommendations: Therapy as outlined in treatment plan below Follow-up recommendations: Acute inpatient rehab (3 hours/day) Functional status assessment: Patient has had a recent decline in their functional status and demonstrates the ability to make significant improvements in function in a reasonable and predictable amount of time. Treatment frequency: Min 2x/week Treatment duration: 2 weeks Interventions: Aspiration precaution training; Patient/family education; Diet toleration management by SLP Recommendations Recommendations for follow up therapy are one component of a multi-disciplinary discharge planning process, led by the attending physician.  Recommendations may be updated based on patient status, additional functional criteria and insurance authorization. Assessment: Orofacial Exam: Orofacial Exam Oral Cavity: Oral Hygiene: WFL Oral Cavity - Dentition: Adequate natural dentition Orofacial Anatomy: WFL Oral Motor/Sensory Function: WFL Anatomy: Anatomy: WFL Boluses Administered: Boluses Administered Boluses Administered: Thin liquids (Level 0); Mildly thick liquids (Level 2, nectar thick); Puree; Solid  Oral  Impairment Domain: Oral Impairment Domain Lip Closure: No labial escape Tongue control during bolus hold: Escape to lateral buccal cavity/floor of mouth Bolus preparation/mastication: Slow prolonged chewing/mashing with complete recollection Bolus transport/lingual motion: Slow tongue motion Oral residue: Majority of bolus remaining (piecemeal) Location of oral residue : Tongue Initiation of pharyngeal swallow : Valleculae  Pharyngeal Impairment Domain: Pharyngeal Impairment Domain Soft palate elevation: No bolus between soft palate (SP)/pharyngeal wall (PW) Laryngeal elevation: Complete superior movement of thyroid cartilage with complete approximation of arytenoids to epiglottic petiole Anterior hyoid excursion: Partial anterior movement Epiglottic movement: Complete inversion Laryngeal vestibule closure: Complete, no air/contrast in laryngeal vestibule Pharyngeal stripping wave : Present - complete Pharyngeal contraction (A/P view only): N/A Pharyngoesophageal segment opening: Complete distension and complete duration, no obstruction of flow Tongue base retraction: No contrast between tongue base and posterior pharyngeal wall (PPW) Pharyngeal residue: Complete pharyngeal clearance Location of pharyngeal residue: N/A  Esophageal Impairment Domain: Esophageal Impairment Domain Esophageal clearance upright position: Complete clearance, esophageal coating Pill: Pill Consistency administered: Thin liquids (Level 0) Thin liquids (Level 0): [facility] Penetration/Aspiration Scale Score: Penetration/Aspiration Scale Score 1.  Material does not enter airway: Mildly thick liquids (Level 2, nectar thick); Puree; Solid; Pill 6.  Material enters airway, passes BELOW cords then ejected out: Thin liquids (Level 0) (x1 on initial spoonful of thin liquid) Compensatory Strategies: Compensatory Strategies Compensatory strategies: No   General Information: Caregiver present: No  Diet Prior to this Study: NPO   Temperature : Normal    Respiratory Status: WFL   Supplemental O2: None (Room air)   History of Recent Intubation: No  Behavior/Cognition: Alert; Cooperative; Requires cueing Self-Feeding Abilities: Able to self-feed Baseline vocal quality/speech: Other (comment) (monotone) Volitional Cough: Able to elicit Volitional Swallow: Able to elicit Exam Limitations: No limitations Goal Planning: Prognosis for improved oropharyngeal function: Good Barriers to Reach Goals: Cognitive deficits No data recorded Patient/Family Stated Goal: none stated Consulted and agree with results and recommendations: Patient Pain: Pain Assessment Pain Assessment: Faces Faces Pain Scale: 0 End of Session: Start Time:SLP Start Time (ACUTE ONLY): 1016 Stop Time: SLP Stop Time (ACUTE ONLY): 1030 Time Calculation:SLP Time Calculation (min) (ACUTE ONLY): 14 min Charges: SLP Evaluations $ SLP Speech Visit: 1 Visit SLP Evaluations $BSS Swallow: 1 Procedure $MBS Swallow: 1 Procedure $Speech Treatment for Individual: 1 Procedure SLP visit diagnosis: SLP Visit Diagnosis: Dysphagia, oral phase (R13.11) Past Medical History: Past Medical History: Diagnosis Date  ADHD   Depression  Past Surgical History: Past Surgical History: Procedure Laterality Date  TRANSESOPHAGEAL ECHOCARDIOGRAM (CATH LAB) N/A 07/07/2024  Procedure: TRANSESOPHAGEAL ECHOCARDIOGRAM;  Surgeon: Lonni Slain, MD;  Location: [facility] INVASIVE CV LAB;  Service: Cardiovascular;  Laterality: N/A; Leita SAILOR., M.A. CCC-SLP Acute Rehabilitation Services Office: [phone] Secure chat preferred 07/12/2024, 11:21 AM  MR BRAIN WO CONTRAST Result Date: 07/11/2024 CLINICAL DATA:  Follow-up examination for stroke EXAM: MRI HEAD WITHOUT CONTRAST TECHNIQUE: Multiplanar, multiecho pulse sequences of the brain and surrounding structures were obtained without intravenous contrast. COMPARISON:  Prior MRI from 07/06/2024 FINDINGS: Brain: Previously identified bilateral cerebellar infarcts again seen, primarily involving the SCA  distributions, left slightly worse than right, stable. Possible further extension versus wallerian degeneration seen involving the midbrain since previous (series 2, images 19, 20). Otherwise, overall distribution is relatively stable. Associated susceptibility artifact consistent with petechial hemorrhage. Superimposed intrinsic T1 hyperintensity at the right greater than left cerebellum. No frank hyperdense blood products or intraparenchymal hematoma seen on CT from earlier the same day. Otherwise, remainder the brain remains normal in appearance. No other evidence for acute or subacute ischemia. No other areas of chronic cortical infarction. No other acute or chronic intracranial blood products. No mass lesion or midline shift. No hydrocephalus or extra-axial fluid collection. Basilar cisterns remain patent. Pituitary gland suprasellar region within normal limits. Vascular: Loss of normal flow void within the  left vertebral artery, consistent with previously identified dissection. Major intracranial vascular flow voids are otherwise maintained. Skull and upper cervical spine: Craniocervical junction within normal limits. Bone marrow signal intensity felt to be within normal limits for age. No scalp soft tissue abnormality. Sinuses/Orbits: Globes orbital soft tissues within normal limits. Paranasal sinuses are clear. No significant mastoid effusion. Other: None. IMPRESSION: 1. Evolving early subacute bilateral cerebellar infarcts, left slightly worse than right. Possible mild interval expansion versus changes of Wallerian degeneration (favored) involving the midbrain since previous. Associated petechial hemorrhage without frank hemorrhagic transformation. 2. Loss of normal flow void within the left vertebral artery, consistent with previously identified dissection. 3. Otherwise stable and normal brain MRI. Electronically Signed   By: Morene Hoard M.D.   On: 07/11/2024 19:30   CT ANGIO HEAD NECK W WO  CM Result Date: 07/11/2024 EXAM: CTA HEAD AND NECK WITH AND WITHOUT 07/11/2024 12:58:50 PM TECHNIQUE: CTA of the head and neck was performed with and without the administration of intravenous contrast. Multiplanar 2D and/or 3D reformatted images are provided for review. Automated exposure control, iterative reconstruction, and/or weight based adjustment of the mA/kV was utilized to reduce the radiation dose to as low as reasonably achievable. Stenosis of the internal carotid arteries measured using NASCET criteria. COMPARISON: MRI head 07/06/24 and CTA head and neck and CT head 07/05/24 CLINICAL HISTORY: Neuro deficit, acute, stroke suspected. FINDINGS: CTA NECK: AORTIC ARCH AND ARCH VESSELS: No dissection or arterial injury. No significant stenosis of the brachiocephalic or subclavian arteries. Common aeration of the brachiocephalic and left common carotid arteries. CERVICAL CAROTID ARTERIES: No dissection, arterial injury, or hemodynamically significant stenosis by NASCET criteria. CERVICAL VERTEBRAL ARTERIES: The right vertebral artery is patent from the origin to the vertebrobasilar confluence. Redemonstrated left vertebral artery dissection. There is occlusion of the proximal left V1 segment. Reconstitution along the distal V2 segment is similar to prior. There is increased irregularity of the left V3 segment with decreased vessel caliber. The left V4 segment also appears slightly decreased in caliber. LUNGS AND MEDIASTINUM: Unremarkable. SOFT TISSUES: No acute abnormality. BONES: No acute abnormality. CTA HEAD: ANTERIOR CIRCULATION: No significant stenosis of the internal carotid arteries. No significant stenosis of the anterior cerebral arteries. No significant stenosis of the middle cerebral arteries. No aneurysm. POSTERIOR CIRCULATION: The basilar artery is patent. The posterior cerebral arteries are patent bilaterally. The superior cerebellar arteries are patent bilaterally. Small posterior communicating  arteries visualized bilaterally. The posterior inferior cerebellar arteries are visualized bilaterally. OTHER: Evolving bilateral cerebellar infarcts, particularly within the superior cerebral artery territories, with associated mass effect. There is partial effacement of the fourth ventricle. There is no CT evidence of large territory infarct within the supratentorial parenchyma. Question subtle increase in caliber of the lateral ventricles and third ventricle compared to prior without frank hydrocephalus on the current study. There is no midline shift. No acute intracranial hemorrhage. No dural venous sinus thrombosis on this non-dedicated study. IMPRESSION: 1. Evolving bilateral cerebellar infarcts, particularly within the superior cerebellar artery territories, with associated mass effect and partial effacement of the fourth ventricle. 2. No acute intracranial hemorrhage. 3. Redemonstrated left vertebral artery dissection with occlusion of the proximal left V1 segment and reconstitution along the distal V2 segment. Increased irregularity of the left V3 segment with decreased vessel caliber, and slightly decreased caliber of the left V4 segment. 4. Anterior circulation is unremarkable. 5. Findings discussed with Dr. Vanessa at 1:24PM on 07/11/24. Electronically signed by: Donnice Mania MD 07/11/2024 01:48 PM EDT RP  Workstation: [id number]   VAS US  TRANSCRANIAL DOPPLER W BUBBLES Result Date: 07/08/2024  Transcranial Doppler with Bubble Patient Name:  TAILEY TOP  Date of Exam:   07/08/2024 Medical Rec #: [id number]       Accession #:    [id number] Date of Birth: [birth year]      Patient Gender: F Patient Age:   75 years Exam Location:  [facility] Procedure:      VAS US  TRANSCRANIAL DOPPLER W BUBBLES Referring Phys: BRIDGETTE CHRISTOPHER --------------------------------------------------------------------------------  Indications: Stroke. History: TEE: PFO seen by color Doppler (left to right shunt).  Agitated saline at rest was late positive for shunt, but agitated saline with abdominal pressure (Valsalva) was rapidly positive, consistent with right to left intra-atrial shunt.  Comparison Study: No prior study on file Performing Technologist: Alberta Lis RVS  Examination Guidelines: A complete evaluation includes B-mode imaging, spectral Doppler, color Doppler, and power Doppler as needed of all accessible portions of each vessel. Bilateral testing is considered an integral part of a complete examination. Limited examinations for reoccurring indications may be performed as noted.  Summary:  A vascular evaluation was performed. The right middle cerebral artery was studied. An IV was inserted into the patient's left forearm. Verbal informed consent was obtained.  TCD bubble study indicative of PFO [name] Grade III lower end of scale at rest, and higher end of scale with Valsalva maneuver.   Diagnosing physician: Ary Cummins MD Electronically signed by Ary Cummins MD on 07/08/2024 at 6:10:40 PM.    Final    ECHO TEE Result Date: 07/07/2024    TRANSESOPHOGEAL ECHO REPORT   Patient Name:   MANILA ROMMEL Date of Exam: 07/07/2024 Medical Rec #:  [id number]      Height:       65.0 in Accession #:    [id number]     Weight:       140.0 lb Date of Birth:  01/11/1996     BSA:          1.700 m Patient Age:    27 years       BP:           128/80 mmHg Patient Gender: F              HR:           111 bpm. Exam Location:  Inpatient Procedure: Transesophageal Echo, 3D Echo, Cardiac Doppler, Color Doppler and            Saline Contrast Bubble Study (Both Spectral and Color Flow Doppler            were utilized during procedure). Indications:     Stroke i63.9  History:         Patient has prior history of Echocardiogram examinations, most                  recent 07/06/2024.  Sonographer:     Damien Senior RDCS Referring Phys:  [id number] HAO MENG Diagnosing Phys: Shelda Bruckner MD PROCEDURE: After discussion of the risks and  benefits of a TEE, an informed consent was obtained from the patient. The transesophogeal probe was passed without difficulty through the esophogus of the patient. Sedation performed by different physician. The patient was monitored while under deep sedation. Anesthestetic sedation was provided intravenously by Anesthesiology: 380mg  of Propofol , 40mg  of Lidocaine . Image quality was good. The patient developed no complications during the procedure.  IMPRESSIONS  1. Left ventricular ejection fraction, by estimation, is 60 to  65%. The left ventricle has normal function.  2. Right ventricular systolic function is normal. The right ventricular size is normal.  3. No left atrial/left atrial appendage thrombus was detected. The LAA emptying velocity was 64 cm/s.  4. Distal anterior mitral leaflet tip appears to have minimal prolapse of fibrinous appearing structures. The mitral valve is abnormal. Trivial mitral valve regurgitation. No evidence of mitral stenosis.  5. The aortic valve is tricuspid. Aortic valve regurgitation is not visualized. No aortic stenosis is present.  6. Evidence of atrial level shunting detected by color flow Doppler. Agitated saline contrast bubble study was positive with shunting observed within 3-6 cardiac cycles suggestive of interatrial shunt. There is a small patent foramen ovale with bidirectional shunting across atrial septum.  7. 3D performed of the mitral valve and 3D performed of the atrial septum and demonstrates Distal anterior mitral leaflet tip appears to have minimal prolapse of fibrinous appearing structures. PFO visualized. Conclusion(s)/Recommendation(s): PFO seen by color Doppler (left to right shunt). Agitated saline at rest was late positive for shunt, but agitated saline with abdominal pressure (Valsalva) was rapidly positive, consistent with right to left intra-atrial  shunt. FINDINGS  Left Ventricle: Left ventricular ejection fraction, by estimation, is 60 to 65%. The left  ventricle has normal function. The left ventricular internal cavity size was normal in size. Right Ventricle: The right ventricular size is normal. No increase in right ventricular wall thickness. Right ventricular systolic function is normal. Left Atrium: Left atrial size was normal in size. No left atrial/left atrial appendage thrombus was detected. The LAA emptying velocity was 64 cm/s. Right Atrium: Right atrial size was normal in size. Prominent Chiari network. Pericardium: There is no evidence of pericardial effusion. Mitral Valve: Distal anterior mitral leaflet tip appears to have minimal prolapse of fibrinous appearing structures. The mitral valve is abnormal. Trivial mitral valve regurgitation. No evidence of mitral valve stenosis. There is no evidence of mitral valve vegetation. Tricuspid Valve: The tricuspid valve is normal in structure. Tricuspid valve regurgitation is mild . No evidence of tricuspid stenosis. There is no evidence of tricuspid valve vegetation. Aortic Valve: The aortic valve is tricuspid. Aortic valve regurgitation is not visualized. No aortic stenosis is present. There is no evidence of aortic valve vegetation. Pulmonic Valve: The pulmonic valve was normal in structure. Pulmonic valve regurgitation is trivial. There is no evidence of pulmonic valve vegetation. Aorta: The aortic root and ascending aorta are structurally normal, with no evidence of dilitation. IAS/Shunts: Evidence of atrial level shunting detected by color flow Doppler. Agitated saline contrast was given intravenously to evaluate for intracardiac shunting. Agitated saline contrast bubble study was positive with shunting observed within 3-6 cardiac cycles suggestive of interatrial shunt. There is no evidence of a patent foramen ovale. A small patent foramen ovale is detected with bidirectional shunting across atrial septum. Additional Comments: Spectral Doppler performed. Shelda Bruckner MD Electronically signed by  Shelda Bruckner MD Signature Date/Time: 07/07/2024/6:59:37 PM    Final    VAS US  LOWER EXTREMITY VENOUS (DVT) Result Date: 07/07/2024  Lower Venous DVT Study Patient Name:  NICHOLL ONSTOTT  Date of Exam:   07/07/2024 Medical Rec #: [id number]       Accession #:    [id number] Date of Birth: May 17, 1996      Patient Gender: F Patient Age:   42 years Exam Location:  [facility] Procedure:      VAS US  LOWER EXTREMITY VENOUS (DVT) Referring Phys: ARY XU --------------------------------------------------------------------------------  Indications: Stroke.  Risk  Factors: None identified. Comparison Study: No prior studies. Performing Technologist: Cordella Collet RVT  Examination Guidelines: A complete evaluation includes B-mode imaging, spectral Doppler, color Doppler, and power Doppler as needed of all accessible portions of each vessel. Bilateral testing is considered an integral part of a complete examination. Limited examinations for reoccurring indications may be performed as noted. The reflux portion of the exam is performed with the patient in reverse Trendelenburg.  +---------+---------------+---------+-----------+----------+--------------+ RIGHT    CompressibilityPhasicitySpontaneityPropertiesThrombus Aging +---------+---------------+---------+-----------+----------+--------------+ CFV      Full           Yes      Yes                                 +---------+---------------+---------+-----------+----------+--------------+ SFJ      Full                                                        +---------+---------------+---------+-----------+----------+--------------+ FV Prox  Full                                                        +---------+---------------+---------+-----------+----------+--------------+ FV Mid   Full                                                        +---------+---------------+---------+-----------+----------+--------------+ FV DistalFull                                                         +---------+---------------+---------+-----------+----------+--------------+ PFV      Full                                                        +---------+---------------+---------+-----------+----------+--------------+ POP      Full           Yes      Yes                                 +---------+---------------+---------+-----------+----------+--------------+ PTV      Full                                                        +---------+---------------+---------+-----------+----------+--------------+ PERO     Full                                                        +---------+---------------+---------+-----------+----------+--------------+   +---------+---------------+---------+-----------+----------+--------------+  LEFT     CompressibilityPhasicitySpontaneityPropertiesThrombus Aging +---------+---------------+---------+-----------+----------+--------------+ CFV      Full           Yes      Yes                                 +---------+---------------+---------+-----------+----------+--------------+ SFJ      Full                                                        +---------+---------------+---------+-----------+----------+--------------+ FV Prox  Full                                                        +---------+---------------+---------+-----------+----------+--------------+ FV Mid   Full                                                        +---------+---------------+---------+-----------+----------+--------------+ FV DistalFull                                                        +---------+---------------+---------+-----------+----------+--------------+ PFV      Full                                                        +---------+---------------+---------+-----------+----------+--------------+ POP      Full           Yes      Yes                                  +---------+---------------+---------+-----------+----------+--------------+ PTV      Full                                                        +---------+---------------+---------+-----------+----------+--------------+ PERO     Full                                                        +---------+---------------+---------+-----------+----------+--------------+     Summary: RIGHT: - There is no evidence of deep vein thrombosis in the lower extremity.  - No cystic structure found in the popliteal fossa.  LEFT: - There is no evidence of deep vein thrombosis in the lower extremity.  - No  cystic structure found in the popliteal fossa.  *See table(s) above for measurements and observations. Electronically signed by Lonni Gaskins MD on 07/07/2024 at 5:40:37 PM.    Final    EP STUDY Result Date: 07/07/2024 See surgical note for result.  EEG adult Result Date: 07/07/2024 Shelton Arlin KIDD, MD     07/07/2024  8:49 AM Patient Name: Nhyla Nappi MRN: [id number] Epilepsy Attending: Arlin KIDD Shelton Referring Physician/Provider: Doutova, Anastassia, MD Date: 07/07/2024 Duration: 23.24 mins Patient history: 28yo F with seizure like activity. EEG to evaluate for seizure Level of alertness: Awake AEDs during EEG study: None Technical aspects: This EEG study was done with scalp electrodes positioned according to the 10-20 International system of electrode placement. Electrical activity was reviewed with band pass filter of 1-70Hz , sensitivity of 7 uV/mm, display speed of 73mm/sec with a 60Hz  notched filter applied as appropriate. EEG data were recorded continuously and digitally stored.  Video monitoring was available and reviewed as appropriate. Description: The posterior dominant rhythm consists of 8-9 Hz activity of moderate voltage (25-35 uV) seen predominantly in posterior head regions, symmetric and reactive to eye opening and eye closing. Hyperventilation and photic stimulation were not performed.    IMPRESSION: This study is within normal limits. No seizures or epileptiform discharges were seen throughout the recording. A normal interictal EEG does not exclude the diagnosis of epilepsy. Arlin KIDD Shelton   MR BRAIN WO CONTRAST Result Date: 07/06/2024 CLINICAL DATA:  Provided history: Stroke, follow-up. EXAM: MRI HEAD WITHOUT CONTRAST TECHNIQUE: Multiplanar, multiecho pulse sequences of the brain and surrounding structures were obtained without intravenous contrast. COMPARISON:  Non-contrast head CT and CT angiogram head/neck 07/05/2024. FINDINGS: Brain: Cerebral volume is normal. Acute infarcts within cerebellar vermis and bilateral cerebellar hemispheres. Most notably, large acute infarcts are present within the superior cerebellar artery territories bilaterally. Posterior fossa mass effect without cerebellar tonsillar herniation or evidence of obstructive hydrocephalus. Petechial hemorrhage within the cerebellar vermis and superior right cerebellar hemisphere. No evidence of an intracranial mass. No extra-axial fluid collection. No midline shift. Vascular: Please refer to the CTA head/neck performed yesterday. Skull and upper cervical spine: No focal worrisome marrow lesion. Sinuses/Orbits: No mass or acute finding within the imaged orbits. No significant paranasal sinus disease These results will be called to the ordering clinician or representative by the Radiologist Assistant, and communication documented in the PACS or [employer]. IMPRESSION: Acute infarcts within the cerebellar vermis and bilateral cerebellar hemispheres. Most notably, large acute infarcts are present within the superior cerebellar artery territories bilaterally. Posterior fossa mass effect without cerebellar tonsillar herniation or evidence of obstructive hydrocephalus. Petechial hemorrhage within the cerebellar vermis and superior right cerebellar hemisphere. Electronically Signed   By: Rockey Childs D.O.   On: 07/06/2024 18:29    ECHOCARDIOGRAM COMPLETE Result Date: 07/06/2024    ECHOCARDIOGRAM REPORT   Patient Name:   MIRAY MANCINO Date of Exam: 07/06/2024 Medical Rec #:  [id number]      Height:       65.0 in Accession #:    [id number]     Weight:       140.0 lb Date of Birth:  04-07-1996     BSA:          1.700 m Patient Age:    27 years       BP:           120/79 mmHg Patient Gender: F              HR:  77 bpm. Exam Location:  Inpatient Procedure: 2D Echo, Color Doppler and Cardiac Doppler (Both Spectral and Color            Flow Doppler were utilized during procedure). Indications:   Stroke i63.9  History:       Patient has no prior history of Echocardiogram examinations.                Stroke.  Sonographer:   Koleen Popper RDCS Referring      [phone] ANASTASSIA DOUTOVA Phys:  Sonographer Comments: Image acquisition challenging due to uncooperative patient. IMPRESSIONS  1. Left ventricular ejection fraction, by estimation, is 60 to 65%. The left ventricle has normal function. The left ventricle has no regional wall motion abnormalities. Left ventricular diastolic parameters were normal.  2. Right ventricular systolic function is normal. The right ventricular size is normal. There is normal pulmonary artery systolic pressure.  3. The mitral valve is normal in structure. Mild mitral valve regurgitation. No evidence of mitral stenosis.  4. The aortic valve is tricuspid. Aortic valve regurgitation is not visualized. visually opens well but gradient not assessed.  5. The inferior vena cava is normal in size with greater than 50% respiratory variability, suggesting right atrial pressure of 3 mmHg. FINDINGS  Left Ventricle: Left ventricular ejection fraction, by estimation, is 60 to 65%. The left ventricle has normal function. The left ventricle has no regional wall motion abnormalities. The left ventricular internal cavity size was normal in size. There is  no left ventricular hypertrophy. Left ventricular diastolic parameters were  normal. Right Ventricle: The right ventricular size is normal. No increase in right ventricular wall thickness. Right ventricular systolic function is normal. There is normal pulmonary artery systolic pressure. The tricuspid regurgitant velocity is 2.06 m/s, and  with an assumed right atrial pressure of 3 mmHg, the estimated right ventricular systolic pressure is 20.0 mmHg. Left Atrium: Left atrial size was normal in size. Right Atrium: Right atrial size was normal in size. Pericardium: There is no evidence of pericardial effusion. Mitral Valve: The mitral valve is normal in structure. Mild mitral valve regurgitation. No evidence of mitral valve stenosis. Tricuspid Valve: The tricuspid valve is normal in structure. Tricuspid valve regurgitation is mild . No evidence of tricuspid stenosis. Aortic Valve: The aortic valve is tricuspid. Aortic valve regurgitation is not visualized. Visually opens well but gradient not assessed. Pulmonic Valve: The pulmonic valve was normal in structure. Pulmonic valve regurgitation is trivial. No evidence of pulmonic stenosis. Aorta: The aortic root is normal in size and structure. Venous: The inferior vena cava is normal in size with greater than 50% respiratory variability, suggesting right atrial pressure of 3 mmHg. IAS/Shunts: No atrial level shunt detected by color flow Doppler.  LEFT VENTRICLE PLAX 2D LVIDd:         5.00 cm   Diastology LVIDs:         3.60 cm   LV e' medial:    11.60 cm/s LV PW:         0.70 cm   LV E/e' medial:  6.2 LV IVS:        0.70 cm   LV e' lateral:   13.20 cm/s LVOT diam:     1.90 cm   LV E/e' lateral: 5.5 LV SV:         63 LV SV Index:   37 LVOT Area:     2.84 cm  RIGHT VENTRICLE  IVC RV S prime:     17.30 cm/s  IVC diam: 1.90 cm TAPSE (M-mode): 2.9 cm LEFT ATRIUM             Index        RIGHT ATRIUM           Index LA diam:        3.10 cm 1.82 cm/m   RA Area:     11.60 cm LA Vol (A2C):   44.9 ml 26.41 ml/m  RA Volume:   25.10 ml  14.77  ml/m LA Vol (A4C):   20.6 ml 12.12 ml/m LA Biplane Vol: 30.0 ml 17.65 ml/m  AORTIC VALVE             PULMONIC VALVE LVOT Vmax:   115.00 cm/s PR End Diast Vel: 1.51 msec LVOT Vmean:  73.300 cm/s LVOT VTI:    0.221 m  AORTA Ao Root diam: 2.80 cm Ao Asc diam:  2.70 cm MITRAL VALVE               TRICUSPID VALVE MV Area (PHT): 3.68 cm    TR Peak grad:   17.0 mmHg MV Decel Time: 206 msec    TR Vmax:        206.00 cm/s MR Peak grad: 24.2 mmHg MR Vmax:      246.00 cm/s  SHUNTS MV E velocity: 72.40 cm/s  Systemic VTI:  0.22 m MV A velocity: 86.50 cm/s  Systemic Diam: 1.90 cm MV E/A ratio:  0.84 Franck Azobou Tonleu Electronically signed by Joelle Cedars Tonleu Signature Date/Time: 07/06/2024/11:37:42 AM    Final    CT ANGIO HEAD NECK W WO CM W PERF (CODE STROKE) Result Date: 07/05/2024 EXAM: CTA Head and Neck with Perfusion 07/05/2024 08:55:08 PM TECHNIQUE: CTA of the head and neck was performed with the administration of intravenous contrast. 3D postprocessing with multiplanar reconstructions and MIPs was performed to evaluate the vascular anatomy. Automated exposure control, iterative reconstruction, and/or weight based adjustment of the mA/kV was utilized to reduce the radiation dose to as low as reasonably achievable. COMPARISON: 07/05/2024 head CT CLINICAL HISTORY: Neuro deficit, acute, stroke suspected; ataxia, slurred speech, new small cerebellar stroke on CTH. Triage notes: seizure activity stated by her roommate. Pt described as overly emotional, was rolling around and throwing her hands around, keeps writing in a diary she has. Did not hit her head, no oral injury, no loss of urine. A\T\Ox4. VS stable. FINDINGS: CTA NECK: AORTIC ARCH AND ARCH VESSELS: No dissection or arterial injury. No significant stenosis of the brachiocephalic or subclavian arteries. CERVICAL CAROTID ARTERIES: No dissection, arterial injury, or hemodynamically significant stenosis by NASCET criteria. CERVICAL VERTEBRAL ARTERIES: No  opacification of the left vertebral artery distal V1 and proximal V2 segments. There is reconstitution at the distal V2 segment but enhancement remains asymmetrically decreased throughout the remainder of the course of the vertebral artery. The right vertebral artery is normal. No proximal occlusion visualized of the inferior cerebellar arteries. LUNGS AND MEDIASTINUM: Unremarkable. SOFT TISSUES: No acute abnormality. BONES: No acute abnormality. CTA HEAD: ANTERIOR CIRCULATION: No significant stenosis of the internal carotid arteries. No significant stenosis of the anterior cerebral arteries. No significant stenosis of the middle cerebral arteries. No aneurysm. POSTERIOR CIRCULATION: No significant stenosis of the posterior cerebral arteries. No significant stenosis of the basilar artery. No significant stenosis of the vertebral arteries. No aneurysm. OTHER: No dural venous sinus thrombosis on this non-dedicated study. CT PERFUSION: Fusion imaging shows a 13 mL  region of ischemia affecting both cerebellar hemispheres but worse on the left. No core infarct. EXAM QUALITY: Exam quality is adequate with diagnostic perfusion maps. No significant motion artifact. Appropriate arterial inflow and venous outflow curves. CORE INFARCT (CBF<30% volume): 0 mL TOTAL HYPOPERFUSION (Tmax>6s volume): 13 mL PENUMBRA: Mismatch volume: 13 mL Mismatch ratio: Not applicable Location: Cerebellar hemispheres, worse on the left. IMPRESSION: 1. Proximal left vertebral artery dissection. Distal v2 segment reconstitution but with attenuated enhancement relative to the contralateral side along the remainder of its course. 2. A 13 mL region of ischemia affecting both cerebellar hemispheres, worse on the left, with no core infarct. Findings discussed with Dr. Caron Salt at 09:05 pm on 07/05/2024 Electronically signed by: Franky Stanford MD 07/05/2024 09:07 PM EDT RP Workstation: [id number]   CT Head Wo Contrast Result Date: 07/05/2024 EXAM: CT  HEAD WITHOUT CONTRAST 07/05/2024 06:33:51 PM TECHNIQUE: CT of the head was performed without the administration of intravenous contrast. Automated exposure control, iterative reconstruction, and/or weight based adjustment of the mA/kV was utilized to reduce the radiation dose to as low as reasonably achievable. COMPARISON: 08/24/2019 CLINICAL HISTORY: Seizure, new-onset, no history of trauma. Per chart: Pt BIB ems for seizure activity stated by her roommate. Pt described as overly emotional, was rolling around and throwing her hands around, keeps writing in a diary she has. Did not hit her head, no oral injury, no loss of urine. FINDINGS: BRAIN AND VENTRICLES: No acute hemorrhage. No evidence of acute infarct. No hydrocephalus. No extra-axial collection. No mass effect or midline shift. Old right cerebellar infarct but new since the prior study. ORBITS: No acute abnormality. SINUSES: No acute abnormality. SOFT TISSUES AND SKULL: No acute soft tissue abnormality. No skull fracture. IMPRESSION: 1. No acute intracranial abnormality. 2. New right cerebellar infarct since the prior study (2020), but likely chronic. Electronically signed by: Franky Stanford MD 07/05/2024 06:49 PM EDT RP Workstation: [id number]   DG Chest Portable 1 View Result Date: 07/05/2024 EXAM: 1 VIEW XRAY OF THE CHEST 07/05/2024 05:36:00 PM COMPARISON: 12/31/2017 CLINICAL HISTORY: ams. Per chart: Pt BIB ems for seizure activity stated by her roommate. Pt described as overly emotional, was rolling around and throwing her hands around, keeps writing in a diary she has. Did not hit her head, no oral injury, no loss of urine. A\T\Ox4. VS ; stable FINDINGS: LUNGS AND PLEURA: Low lung volumes. No focal pulmonary opacity. No pulmonary edema. No pleural effusion. No pneumothorax. HEART AND MEDIASTINUM: No acute abnormality of the cardiac and mediastinal silhouettes. BONES AND SOFT TISSUES: No acute osseous abnormality. IMPRESSION: 1. No acute process. 2. Low  lung volumes. Electronically signed by: Franky Stanford MD 07/05/2024 06:47 PM EDT RP Workstation: [id number]    Labs:  Basic Metabolic Panel: Recent Labs  Lab 07/28/24 0557 08/01/24 0614  NA 138 136  K 4.1 3.8  CL 108 107  CO2 25 23  GLUCOSE 84 78  BUN 7 8  CREATININE 0.93 0.99  CALCIUM  8.8* 8.8*    CBC: Recent Labs  Lab 07/28/24 0557 08/01/24 0614  WBC 5.3 6.3  HGB 12.9 12.8  HCT 38.8 38.0  MCV 77.4* 76.0*  PLT 98* 93*    CBG: No results for input(s): GLUCAP in the last 168 hours.  Brief HPI:   Pollyanna Levay is a 28 y.o. right-handed female with history significant for ADHD/depression.  No prescription medications other than birth control.  Per chart review lives with a friend.  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  They live in a second floor apartment.  Plan to discharge home with friend and 2 aunts with good support.  Family is checking on ground availability apartments.  Presented 07/05/2024 with dizziness slurred speech headache and gait abnormality with left-sided weakness.  Per family she had went to the gym on Monday, 07/04/2024 for weightlifting as usual no specific problems.  Tuesday morning she awoke from sleep with headache and neck pain and by the afternoon patient with increasing dizziness vertigo as well as slurred speech.  Cranial CT scan showed no acute intracranial abnormalities new right cerebellar infarct since prior study of 2021 felt to be possibly chronic.  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  Distal V2 segment reconstitution but with attenuated enhancement relative to the contralateral side along the remainder of its course.  A 13 mm region of ischemia affecting both cerebral hemispheres worse on the left with no more core infarct.  MRI identified acute infarcts within the cerebellar vermis and bilateral cerebellar hemispheres.  Most notably large acute infarct present within the superior cerebellar artery  territories bilaterally.  Posterior fossa mass effect without cerebellar tonsillar herniation or evidence of obstructive hydrocephalus.  Petechial hemorrhage within the cerebellar vermis and superior right cerebellar hemisphere.  Patient did not receive TNK.  No thrombectomy needed for left VA occlusion as BA was patent.  Admission chemistries unremarkable except potassium 3.2 ALT 58 CK-284 urine drug screen negative.  Echocardiogram with ejection fraction of 60 to 65% no regional wall motion abnormalities.  EEG negative for seizure.  TEE completed bubble study with small PFO at rest LTR shunting and with Valsalva RTL shunting and plan for follow-up outpatient with cardiology services.  Venous Doppler studies negative for DVT.  Patient placed on heparin  drip 9/8 x 48 hours transition to aspirin  81 mg daily and Plavix  75 mg day x 3 months then aspirin  alone.  Psychiatry consulted 07/13/2024 for history of major depressive disorder.  Therapy evaluations completed due to patient decreased functional mobility left-sided weakness was admitted for a comprehensive rehab program.   Hospital Course: Ronna Herskowitz was admitted to rehab 07/13/2024 for inpatient therapies to consist of PT, ST and OT at least three hours five days a week. Past admission physiatrist, therapy team and rehab RN have worked together to provide customized collaborative inpatient rehab.  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  Repeat CTA 2-3 months to evaluate for improvement of dissection.  Continue aspirin  and Plavix  x 3 months then aspirin  alone.  Mood stabilization with history of MDD/ADHD continued on Celexa  as advised psychiatry did follow during hospitalization as well as neuropsychology.  Patient was attentive and following therapies.  During workup of CVA noted PFO TEE showed positive PFO at rest LTR shunting and  with Valsalva RTR shunting.  Follow-up outpatient service for question PFO closure.  Patient remained on Crestor  for hyperlipidemia.  Noted microcytosis low normal ferritin placed on iron supplement.   Blood pressures were monitored on TID basis and remained controlled and monitored     Rehab course: During patient's stay in rehab weekly team conferences were held to monitor patient's progress, set goals and discuss barriers to discharge. At admission, patient required moderate assist 100 feet two-person hand-held assist moderate assist step pivot transfers  He/She  has had improvement in activity tolerance, balance, postural control as well as ability to compensate for deficits. He/She has  had improvement in functional use RUE/LUE  and RLE/LLE as well as improvement in awareness.  Ambulates to therapy gym close standby assist to therapy gym for tabletop activities.  Patient able to pick up and store coins during manipulation of items.  Bed mobility completed from flat bed with supervision.  Able to sit unsupported edge of bed supervision.  Sit to stand no assistive device or upper extremity support.  Ambulates greater than 125 feet gait is still somewhat ataxic.  Full family teaching completed plan discharge to home       Disposition:  Discharge disposition: 06-Home-Health Care Svc        Diet: Regular  Special Instructions: No driving smoking or alcohol  Continue aspirin  81 mg daily and Plavix  75 mg daily x 3 months then aspirin  alone  Follow-up outpatient cardiology services for consideration of PFO closure  Medications at discharge. 1.  Tylenol  as needed 2.  Aspirin  81 mg p.o. daily 3.  Fioricet  1 tablet every 8 hours as needed headache 4.  Celexa  20 mg p.o. daily 5.  Plavix  75 mg p.o. daily 6.  Ferrous sulfate  325 mg every other day 7.  Crestor  40 mg p.o. daily   8.  Oxycodone  5 mg every 4 hours as needed breakthrough pain 9.  Vitamin D  50,000 units every 7  days  30-35 minutes were spent completing discharge summary and discharge planning  Discharge Instructions     Ambulatory referral to Neurology   Complete by: As directed    An appointment is requested in approximately: 4 weeks thromboembolic CVA   Ambulatory referral to Physical Medicine Rehab   Complete by: As directed    Moderate complexity follow-up 1 to 2 weeks moderate lateral cerebellar infarcts        Follow-up Information     Kirsteins, Prentice BRAVO, MD Follow up.   Specialty: Physical Medicine and Rehabilitation Why: Office to call for appointment Contact information: [address] [place] [address] [place] [address] [phone]         Lonni Slain, MD Follow up.   Specialty: Cardiology Why: Call for appointment concerning possible PFO closure Contact information: [address] [place] [address] [phone]                 Signed: Toribio PARAS Ryden Wainer 08/02/2024, 5:02 AM

## 2024-07-13 NOTE — H&P (Signed)
 Physical Medicine and Rehabilitation Admission H&P    CC: Acute infarcts within the cerebellar vermis and bilateral cerebellar hemispheres  HPI: Cassidy Bruce is a 28 year old right-handed African-American female with past medical history of ADHD/depression.  On the prescription medications other than birth control.  Per chart review patient lives with a friend.  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  They live in a second floor apartment.  Plans to discharge home with friend and 2 aunts with good support.  Family is checking on ground availability apartments.  Presented 07/05/2024 with dizziness, slurred speech headache and gait abnormality as well as left side weakness.  Per family she had went to the gym on Monday, 07/04/2024 for weightlifting as usual no specific problems.  Tuesday morning she awoke from sleep with headache and neck pain and by the afternoon patient with increasing dizziness/vertigo as well as slurred speech.  Cranial CT scan showed no acute intracranial abnormality new right cerebellar infarct since prior study of 2021 felt to be possibly chronic.  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  Distal V2 segment reconstitution but with attenuated enhancement relative to the contralateral side along the remainder of its course.  A 13 mL region of ischemia affecting both cerebellar hemispheres, worse on the left with no core infarct.  MRI identified acute infarcts within the cerebellar vermis and bilateral cerebellar hemispheres.  Most notably, large acute infarct present within the superior cerebellar artery territories bilaterally.  Posterior fossa mass effect without cerebellar tonsillar herniation or evidence of obstructive hydrocephalus.  Petechial hemorrhage within the cerebellar vermis and superior right cerebellar hemisphere.  Patient did not receive TNK.  No thrombectomy needed for left VA occlusion as BA was patent.  Admission  chemistries unremarkable except potassium 3.2, glucose 123, ALT 58, CK 284, urine drug screen negative, lactic acid within normal limits.  Echocardiogram with ejection fraction of 60 to 65% no regional wall motion abnormalities.  EEG negative for seizure.  TEE completed bubble study with small PFO, at rest LTR shunting and with Valsalva RTL shunting and plan for follow-up outpatient with cardiology services.  Venous Doppler studies negative for DVT.  Patient initially placed on heparin  drip 9/8 x 48 hours transition to aspirin  81 mg daily and Plavix  75 mg daily for 3 months then aspirin  alone.  Psychiatry consulted 07/13/2024 for history of major depressive disorder.  Patient is tolerating a regular consistency diet.  Therapy evaluations completed due to patient decreased functional mobility was admitted for a comprehensive rehab program. Patient presents with impaired speech.  Review of Systems  Constitutional:  Negative for chills and fever.  HENT:  Negative for hearing loss.   Eyes:  Negative for blurred vision and double vision.  Respiratory:  Negative for cough, shortness of breath and wheezing.   Cardiovascular:  Negative for chest pain, palpitations and leg swelling.  Gastrointestinal:  Positive for constipation. Negative for heartburn, nausea and vomiting.  Genitourinary:  Negative for dysuria, flank pain and hematuria.  Skin:  Negative for rash.  Neurological:  Positive for dizziness, sensory change, speech change, focal weakness, weakness and headaches.  Psychiatric/Behavioral:  Positive for depression.   All other systems reviewed and are negative.  Past Medical History:  Diagnosis Date   ADHD    Depression    Past Surgical History:  Procedure Laterality Date   TRANSESOPHAGEAL ECHOCARDIOGRAM (CATH LAB) N/A 07/07/2024   Procedure: TRANSESOPHAGEAL ECHOCARDIOGRAM;  Surgeon: Lonni Slain, MD;  Location: [facility] INVASIVE  CV LAB;  Service: Cardiovascular;  Laterality: N/A;   Family  History  Problem Relation Age of Onset   Cancer Other    Diabetes Other    CAD Other    Healthy Mother    Social History:  reports that she has never smoked. She has never used smokeless tobacco. She reports that she does not drink alcohol and does not use drugs. Allergies: No Known Allergies Medications Prior to Admission  Medication Sig Dispense Refill   acetaminophen  (TYLENOL ) 500 MG tablet Take 500 mg by mouth every 6 (six) hours as needed.     fluconazole  (DIFLUCAN ) 150 MG tablet Take one tablet today and one tablet in 3 days if symptoms persist. (Patient not taking: Reported on 07/06/2024) 2 tablet 0      Home: Home Living Family/patient expects to be discharged to:: Private residence Living Arrangements: Non-relatives/Friends Available Help at Discharge: Friend(s), Available PRN/intermittently Type of Home: Apartment (apt style hotel) Home Access: Stairs to enter Secretary/administrator of Steps: 2 flights Entrance Stairs-Rails: Can reach both, Right, Left Home Layout: One level Bathroom Shower/Tub: Engineer, manufacturing systems: Standard Home Equipment: None Additional Comments: Pt slid OOB before EMS came   Functional History: Prior Function Prior Level of Function : Independent/Modified Independent, History of Falls (last six months), Working/employed, Driving Mobility Comments: ind no AD, 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 ADLs Comments: ind  Functional Status:  Mobility: Bed Mobility Overal bed mobility: Needs Assistance Bed Mobility: Supine to Sit Rolling: Contact guard assist, Used rails Sidelying to sit: Supervision Supine to sit: Contact guard Sit to supine: Min assist General bed mobility comments: MinA for balance when raising trunk Transfers Overall transfer level: Needs assistance Equipment used: 1 person hand held assist, 2 person hand held assist Transfers: Sit to/from Stand Sit to Stand: Mod assist, +2 safety/equipment Bed to/from  chair/wheelchair/BSC transfer type:: Step pivot Step pivot transfers: Mod assist General transfer comment: MinA to ModA for steadying assist and slight boost-up, +2 safety Ambulation/Gait Ambulation/Gait assistance: Mod assist, +2 safety/equipment Gait Distance (Feet): 100 Feet Assistive device: 2 person hand held assist Gait Pattern/deviations: Ataxic, Narrow base of support, Step-through pattern General Gait Details: ataxic gait pattern with tendency to have narrow BOS with LE's scissoring Gait velocity: decreased Gait velocity interpretation: <1.8 ft/sec, indicate of risk for recurrent falls Pre-gait activities: wt shifting in standing Stairs: Yes Stairs assistance: Contact guard assist Stair Management: Two rails Number of Stairs: 10    ADL: ADL Overall ADL's : Needs assistance/impaired Eating/Feeding: Minimal assistance (NPO however ST gave OK for pt to eat applesauce) Grooming: Oral care, Sitting, Minimal assistance Grooming Details (indicate cue type and reason): cues for LUE slow and controlled movements during elbow ROM Upper Body Bathing: Bed level, Minimal assistance Lower Body Bathing: Moderate assistance Lower Body Bathing Details (indicate cue type and reason): more with standing bathing. Upper Body Dressing : Sitting, Moderate assistance Lower Body Dressing: Moderate assistance, Bed level Lower Body Dressing Details (indicate cue type and reason): donned underwear, some assist to pull them up but pt able to bridge hips. Toilet Transfer: Moderate assistance, Stand-pivot, BSC/3in1, Rolling walker (2 wheels) Toileting- Clothing Manipulation and Hygiene: Maximal assistance, Sit to/from stand Toileting - Clothing Manipulation Details (indicate cue type and reason): max A for balance while pt performed pericare. Functional mobility during ADLs: +2 for physical assistance, +2 for safety/equipment, Moderate assistance  Cognition: Cognition Overall Cognitive Status:  Impaired/Different from baseline Arousal/Alertness: Awake/alert Orientation Level: Oriented X4 Attention: Sustained Sustained  Attention: Impaired Sustained Attention Impairment: Verbal basic Memory: Impaired Memory Impairment: Other (comment) (working memory) Awareness: Impaired Awareness Impairment: Intellectual impairment, Other (comment) (online) Problem Solving: Impaired Problem Solving Impairment: Verbal complex Executive Function: Self Monitoring Self Monitoring: Impaired Self Monitoring Impairment: Verbal basic Safety/Judgment: Impaired Cognition Arousal: Alert Behavior During Therapy: WFL for tasks assessed/performed Overall Cognitive Status: Impaired/Different from baseline  Physical Exam: Blood pressure 118/78, pulse 74, temperature (!) 97.4 F (36.3 C), temperature source Oral, resp. rate 19, height 5' 4 (1.626 m), weight 65.8 kg, SpO2 100%. Gen: no distress, normal appearing HEENT: oral mucosa pink and moist, NCAT Cardio: Reg rate Chest: normal effort, normal rate of breathing Abd: soft, non-distended Ext: no edema Psych: pleasant, normal affect Skin: intact Neurological:     Comments: Patient is alert.  Sitting up in bed.  Makes eye contact with examiner.  Follows simple commands.  Differentiates left from right.  Pseudo bulbar speech dysarthria. LUE 4/5 throughout, strength is otherwise intact, sensation is intact   Results for orders placed or performed during the hospital encounter of 07/05/24 (from the past 48 hours)  Heparin  level (unfractionated)     Status: Abnormal   Collection Time: 07/11/24 10:56 PM  Result Value Ref Range   Heparin  Unfractionated 0.20 (L) 0.30 - 0.70 IU/mL    Comment: (NOTE) The clinical reportable range upper limit is being lowered to >1.10 to align with the FDA approved guidance for the current laboratory assay.  If heparin  results are below expected values, and patient dosage has  been confirmed, suggest follow up testing of  antithrombin III levels. Performed at [facility], [address], [place], [place] [address]   Heparin  level (unfractionated)     Status: None   Collection Time: 07/12/24  4:26 AM  Result Value Ref Range   Heparin  Unfractionated 0.42 0.30 - 0.70 IU/mL    Comment: (NOTE) The clinical reportable range upper limit is being lowered to >1.10 to align with the FDA approved guidance for the current laboratory assay.  If heparin  results are below expected values, and patient dosage has  been confirmed, suggest follow up testing of antithrombin III levels. Performed at [facility], [address], [place], [place] [address]   CBC with Differential/Platelet     Status: Abnormal   Collection Time: 07/12/24  4:26 AM  Result Value Ref Range   WBC 6.5 4.0 - 10.5 K/uL   RBC 4.72 3.87 - 5.11 MIL/uL   Hemoglobin 12.1 12.0 - 15.0 g/dL   HCT 63.2 63.9 - 53.9 %   MCV 77.8 (L) 80.0 - 100.0 fL   MCH 25.6 (L) 26.0 - 34.0 pg   MCHC 33.0 30.0 - 36.0 g/dL   RDW 85.1 88.4 - 84.4 %   Platelets 119 (L) 150 - 400 K/uL   nRBC 0.0 0.0 - 0.2 %   Neutrophils Relative % 57 %   Neutro Abs 3.7 1.7 - 7.7 K/uL   Lymphocytes Relative 29 %   Lymphs Abs 1.9 0.7 - 4.0 K/uL   Monocytes Relative 10 %   Monocytes Absolute 0.7 0.1 - 1.0 K/uL   Eosinophils Relative 3 %   Eosinophils Absolute 0.2 0.0 - 0.5 K/uL   Basophils Relative 1 %   Basophils Absolute 0.1 0.0 - 0.1 K/uL   Immature Granulocytes 0 %   Abs Immature Granulocytes 0.01 0.00 - 0.07 K/uL    Comment: Performed at [facility], [address], [place], [place] [address]  Comprehensive metabolic panel with GFR     Status: Abnormal   Collection Time: 07/12/24  4:26 AM  Result Value Ref Range   Sodium 142 135 - 145 mmol/L   Potassium 3.7 3.5 - 5.1 mmol/L   Chloride 111 98 - 111 mmol/L   CO2 21 (L) 22 - 32 mmol/L   Glucose, Bld 77 70 - 99 mg/dL    Comment: Glucose reference range applies only to samples taken after fasting for at  least 8 hours.   BUN 9 6 - 20 mg/dL   Creatinine, Ser 9.27 0.44 - 1.00 mg/dL   Calcium  8.4 (L) 8.9 - 10.3 mg/dL   Total Protein 6.0 (L) 6.5 - 8.1 g/dL   Albumin 3.1 (L) 3.5 - 5.0 g/dL   AST 48 (H) 15 - 41 U/L   ALT 55 (H) 0 - 44 U/L   Alkaline Phosphatase 58 38 - 126 U/L   Total Bilirubin 0.5 0.0 - 1.2 mg/dL   GFR, Estimated >39 >39 mL/min    Comment: (NOTE) Calculated using the CKD-EPI Creatinine Equation (2021)    Anion gap 10 5 - 15    Comment: Performed at [facility], [address], [place], [place] [address]  Magnesium      Status: None   Collection Time: 07/12/24  4:26 AM  Result Value Ref Range   Magnesium  1.8 1.7 - 2.4 mg/dL    Comment: Performed at [facility], [address], [place], [place] [address]  Phosphorus     Status: None   Collection Time: 07/12/24  4:26 AM  Result Value Ref Range   Phosphorus 3.0 2.5 - 4.6 mg/dL    Comment: Performed at [facility], [address], [place], [place] [address]  Heparin  level (unfractionated)     Status: None   Collection Time: 07/13/24  4:35 AM  Result Value Ref Range   Heparin  Unfractionated 0.53 0.30 - 0.70 IU/mL    Comment: (NOTE) The clinical reportable range upper limit is being lowered to >1.10 to align with the FDA approved guidance for the current laboratory assay.  If heparin  results are below expected values, and patient dosage has  been confirmed, suggest follow up testing of antithrombin III levels. Performed at [facility], [address] [place], [place] [address]   CBC     Status: Abnormal   Collection Time: 07/13/24  4:35 AM  Result Value Ref Range   WBC 5.9 4.0 - 10.5 K/uL   RBC 4.72 3.87 - 5.11 MIL/uL   Hemoglobin 12.3 12.0 - 15.0 g/dL   HCT 63.7 63.9 - 53.9 %   MCV 76.7 (L) 80.0 - 100.0 fL   MCH 26.1 26.0 - 34.0 pg   MCHC 34.0 30.0 - 36.0 g/dL   RDW 85.3 88.4 - 84.4 %   Platelets 116 (L) 150 - 400 K/uL   nRBC 0.0 0.0 - 0.2 %    Comment: Performed at [facility], [address] [place], [place] [address]   DG Swallowing Func-Speech Pathology Result Date: 07/12/2024 Table formatting from the original result was not included. Modified Barium Swallow Study Patient Details Name: Cassidy Bruce MRN: [id number] Date of Birth: 1996-08-07 Today's Date: 07/12/2024 HPI/PMH: HPI: Pt is a 28 yo female presenting to [facility] on 07/05/24 for dizziness, slurred speech, and impaired balance. MRI showed bilat cerebellar infarct with L>R. CTA head/neck showed proximal L vertebral artery dissection. Pt initially passed a swallow screen but  was observed to have difficulty swallowing 9/7 and repeat swallow screen was not passed. PMH of ADHD, MDD. Clinical Impression: Clinical Impression: Pt presents with mild dysphagia. Oral phase fluctuates, at times occurring swiftly and with swift, complete posterior propulsion. Other times, her lingual transit is slow and she has more oral residue, although even when she only swallows a little bit of the bolus on the initial swallow, she uses additional swallows (usually 1-2) to effectively clear her oral cavity. There was a single instance of trace, transient aspiration on the initial spoonful of thin liquid (PAS 6, cleared with a spontaneous throat clear) that seemed to be more a result of reduced oral control, as pharyngeal phase otherwise was [facility] across the study. Recommend resuming regular solids and thin liquids. Factors that may increase risk of adverse event in presence of aspiration Noe & Lianne 2021): Factors that may increase risk of adverse event in presence of aspiration Noe & Lianne 2021): Reduced cognitive function Recommendations/Plan: Swallowing Evaluation Recommendations Swallowing Evaluation Recommendations Recommendations: PO diet PO Diet Recommendation: Regular; Thin liquids (Level 0) Liquid Administration via: Cup; Straw Medication Administration: Whole meds with liquid Supervision: Patient able to self-feed; Intermittent  supervision/cueing for swallowing strategies Swallowing strategies  : Slow rate; Small bites/sips Postural changes: Position pt fully upright for meals Oral care recommendations: Oral care BID (2x/day) Treatment Plan Treatment Plan Treatment recommendations: Therapy as outlined in treatment plan below Follow-up recommendations: Acute inpatient rehab (3 hours/day) Functional status assessment: Patient has had a recent decline in their functional status and demonstrates the ability to make significant improvements in function in a reasonable and predictable amount of time. Treatment frequency: Min 2x/week Treatment duration: 2 weeks Interventions: Aspiration precaution training; Patient/family education; Diet toleration management by SLP Recommendations Recommendations for follow up therapy are one component of a multi-disciplinary discharge planning process, led by the attending physician.  Recommendations may be updated based on patient status, additional functional criteria and insurance authorization. Assessment: Orofacial Exam: Orofacial Exam Oral Cavity: Oral Hygiene: WFL Oral Cavity - Dentition: Adequate natural dentition Orofacial Anatomy: WFL Oral Motor/Sensory Function: WFL Anatomy: Anatomy: WFL Boluses Administered: Boluses Administered Boluses Administered: Thin liquids (Level 0); Mildly thick liquids (Level 2, nectar thick); Puree; Solid  Oral Impairment Domain: Oral Impairment Domain Lip Closure: No labial escape Tongue control during bolus hold: Escape to lateral buccal cavity/floor of mouth Bolus preparation/mastication: Slow prolonged chewing/mashing with complete recollection Bolus transport/lingual motion: Slow tongue motion Oral residue: Majority of bolus remaining (piecemeal) Location of oral residue : Tongue Initiation of pharyngeal swallow : Valleculae  Pharyngeal Impairment Domain: Pharyngeal Impairment Domain Soft palate elevation: No bolus between soft palate (SP)/pharyngeal wall (PW)  Laryngeal elevation: Complete superior movement of thyroid cartilage with complete approximation of arytenoids to epiglottic petiole Anterior hyoid excursion: Partial anterior movement Epiglottic movement: Complete inversion Laryngeal vestibule closure: Complete, no air/contrast in laryngeal vestibule Pharyngeal stripping wave : Present - complete Pharyngeal contraction (A/P view only): N/A Pharyngoesophageal segment opening: Complete distension and complete duration, no obstruction of flow Tongue base retraction: No contrast between tongue base and posterior pharyngeal wall (PPW) Pharyngeal residue: Complete pharyngeal clearance Location of pharyngeal residue: N/A  Esophageal Impairment Domain: Esophageal Impairment Domain Esophageal clearance upright position: Complete clearance, esophageal coating Pill: Pill Consistency administered: Thin liquids (Level 0) Thin liquids (Level 0): [facility] Penetration/Aspiration Scale Score: Penetration/Aspiration Scale Score 1.  Material does not enter airway: Mildly thick liquids (Level 2, nectar thick); Puree; Solid; Pill 6.  Material enters airway, passes BELOW cords then  ejected out: Thin liquids (Level 0) (x1 on initial spoonful of thin liquid) Compensatory Strategies: Compensatory Strategies Compensatory strategies: No   General Information: Caregiver present: No  Diet Prior to this Study: NPO   Temperature : Normal   Respiratory Status: WFL   Supplemental O2: None (Room air)   History of Recent Intubation: No  Behavior/Cognition: Alert; Cooperative; Requires cueing Self-Feeding Abilities: Able to self-feed Baseline vocal quality/speech: Other (comment) (monotone) Volitional Cough: Able to elicit Volitional Swallow: Able to elicit Exam Limitations: No limitations Goal Planning: Prognosis for improved oropharyngeal function: Good Barriers to Reach Goals: Cognitive deficits No data recorded Patient/Family Stated Goal: none stated Consulted and agree with results and  recommendations: Patient Pain: Pain Assessment Pain Assessment: Faces Faces Pain Scale: 0 End of Session: Start Time:SLP Start Time (ACUTE ONLY): 1016 Stop Time: SLP Stop Time (ACUTE ONLY): 1030 Time Calculation:SLP Time Calculation (min) (ACUTE ONLY): 14 min Charges: SLP Evaluations $ SLP Speech Visit: 1 Visit SLP Evaluations $BSS Swallow: 1 Procedure $MBS Swallow: 1 Procedure $Speech Treatment for Individual: 1 Procedure SLP visit diagnosis: SLP Visit Diagnosis: Dysphagia, oral phase (R13.11) Past Medical History: Past Medical History: Diagnosis Date  ADHD   Depression  Past Surgical History: Past Surgical History: Procedure Laterality Date  TRANSESOPHAGEAL ECHOCARDIOGRAM (CATH LAB) N/A 07/07/2024  Procedure: TRANSESOPHAGEAL ECHOCARDIOGRAM;  Surgeon: Lonni Slain, MD;  Location: [facility] INVASIVE CV LAB;  Service: Cardiovascular;  Laterality: N/A; Leita SAILOR., M.A. CCC-SLP Acute Rehabilitation Services Office: [phone] Secure chat preferred 07/12/2024, 11:21 AM  MR BRAIN WO CONTRAST Result Date: 07/11/2024 CLINICAL DATA:  Follow-up examination for stroke EXAM: MRI HEAD WITHOUT CONTRAST TECHNIQUE: Multiplanar, multiecho pulse sequences of the brain and surrounding structures were obtained without intravenous contrast. COMPARISON:  Prior MRI from 07/06/2024 FINDINGS: Brain: Previously identified bilateral cerebellar infarcts again seen, primarily involving the SCA distributions, left slightly worse than right, stable. Possible further extension versus wallerian degeneration seen involving the midbrain since previous (series 2, images 19, 20). Otherwise, overall distribution is relatively stable. Associated susceptibility artifact consistent with petechial hemorrhage. Superimposed intrinsic T1 hyperintensity at the right greater than left cerebellum. No frank hyperdense blood products or intraparenchymal hematoma seen on CT from earlier the same day. Otherwise, remainder the brain remains normal in appearance. No  other evidence for acute or subacute ischemia. No other areas of chronic cortical infarction. No other acute or chronic intracranial blood products. No mass lesion or midline shift. No hydrocephalus or extra-axial fluid collection. Basilar cisterns remain patent. Pituitary gland suprasellar region within normal limits. Vascular: Loss of normal flow void within the left vertebral artery, consistent with previously identified dissection. Major intracranial vascular flow voids are otherwise maintained. Skull and upper cervical spine: Craniocervical junction within normal limits. Bone marrow signal intensity felt to be within normal limits for age. No scalp soft tissue abnormality. Sinuses/Orbits: Globes orbital soft tissues within normal limits. Paranasal sinuses are clear. No significant mastoid effusion. Other: None. IMPRESSION: 1. Evolving early subacute bilateral cerebellar infarcts, left slightly worse than right. Possible mild interval expansion versus changes of Wallerian degeneration (favored) involving the midbrain since previous. Associated petechial hemorrhage without frank hemorrhagic transformation. 2. Loss of normal flow void within the left vertebral artery, consistent with previously identified dissection. 3. Otherwise stable and normal brain MRI. Electronically Signed   By: Morene Hoard M.D.   On: 07/11/2024 19:30      Blood pressure 118/78, pulse 74, temperature (!) 97.4 F (36.3 C), temperature source Oral, resp. rate 19, height 5' 4 (1.626 m),  weight 65.8 kg, SpO2 100%.  Medical Problem List and Plan: 1. 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  Plan repeat CTA 2-3 months to evaluate for improvement dissection  -patient may shower  -ELOS/Goals: 10-12 days MinA  Admit to CIR  2.  Antithrombotics: -DVT/anticoagulation:  Pharmaceutical: Heparin  x  48 hours 07/12/19/2025  -antiplatelet therapy: continue Aspirin  81 mg daily and Plavix  75 mg daily x 3 months then aspirin  alone  3. Pain Management: continue Oxycodone /Fioricet  as needed  4. Mood/Behavior/Sleep/MDD/ADHD: continue Celexa  20 mg daily.  Provide emotional support.  Psychiatry consulted  -antipsychotic agents: N/A  5. Neuropsych/cognition: This patient is not capable of making decisions on her own behalf.  6. Skin/Wound Care: Routine skin checks  7. Fluids/Electrolytes/Nutrition: Routine ins and outs with follow-up chemistries  8.  PFO.  TEE showed positive PFO, at rest LTR shunting and with Valsalva RTL shunting.  Follow-up outpatient cardiology service for question PFO closure  9.  Hyperlipidemia: continue Crestor  20 mg daily  10.  Microcytosis.  Low normal ferritin.  Placed on iron supplement  I have personally performed a face to face diagnostic evaluation, including, but not limited to relevant history and physical exam findings, of this patient and developed relevant assessment and plan.  Additionally, I have reviewed and concur with the physician assistant's documentation above.  Toribio PARAS Angiulli, PA-C    Kataya Guimont P Teiara Baria, MD 07/13/2024

## 2024-07-14 DIAGNOSIS — R718 Other abnormality of red blood cells: Secondary | ICD-10-CM

## 2024-07-14 DIAGNOSIS — I639 Cerebral infarction, unspecified: Secondary | ICD-10-CM

## 2024-07-14 DIAGNOSIS — F39 Unspecified mood [affective] disorder: Secondary | ICD-10-CM

## 2024-07-14 DIAGNOSIS — R7401 Elevation of levels of liver transaminase levels: Secondary | ICD-10-CM

## 2024-07-14 LAB — COMPREHENSIVE METABOLIC PANEL WITH GFR
ALT: 47 U/L — ABNORMAL HIGH (ref 0–44)
AST: 28 U/L (ref 15–41)
Albumin: 3.3 g/dL — ABNORMAL LOW (ref 3.5–5.0)
Alkaline Phosphatase: 67 U/L (ref 38–126)
Anion gap: 7 (ref 5–15)
BUN: 8 mg/dL (ref 6–20)
CO2: 28 mmol/L (ref 22–32)
Calcium: 8.8 mg/dL — ABNORMAL LOW (ref 8.9–10.3)
Chloride: 106 mmol/L (ref 98–111)
Creatinine, Ser: 0.82 mg/dL (ref 0.44–1.00)
GFR, Estimated: >60 mL/min (ref >60)
Glucose, Bld: 84 mg/dL (ref 70–99)
Potassium: 3.5 mmol/L (ref 3.5–5.1)
Sodium: 141 mmol/L (ref 135–145)
Total Bilirubin: 0.6 mg/dL (ref 0.0–1.2)
Total Protein: 6.5 g/dL (ref 6.5–8.1)

## 2024-07-14 LAB — CBC WITH DIFFERENTIAL/PLATELET
Abs Immature Granulocytes: 0.01 K/uL (ref 0.00–0.07)
Basophils Absolute: 0.1 K/uL (ref 0.0–0.1)
Basophils Relative: 1 %
Eosinophils Absolute: 0.2 K/uL (ref 0.0–0.5)
Eosinophils Relative: 3 %
HCT: 39.1 % (ref 36.0–46.0)
Hemoglobin: 13.1 g/dL (ref 12.0–15.0)
Immature Granulocytes: 0 %
Lymphocytes Relative: 25 %
Lymphs Abs: 1.4 K/uL (ref 0.7–4.0)
MCH: 25.8 pg — ABNORMAL LOW (ref 26.0–34.0)
MCHC: 33.5 g/dL (ref 30.0–36.0)
MCV: 77 fL — ABNORMAL LOW (ref 80.0–100.0)
Monocytes Absolute: 0.6 K/uL (ref 0.1–1.0)
Monocytes Relative: 11 %
Neutro Abs: 3.5 K/uL (ref 1.7–7.7)
Neutrophils Relative %: 60 %
Platelets: 131 K/uL — ABNORMAL LOW (ref 150–400)
RBC: 5.08 MIL/uL (ref 3.87–5.11)
RDW: 14.6 % (ref 11.5–15.5)
WBC: 5.8 K/uL (ref 4.0–10.5)
nRBC: 0 % (ref 0.0–0.2)

## 2024-07-14 NOTE — Progress Notes (Signed)
 [facility]patient Rehabilitation Center Individual Statement of Services  Patient Name:  Cassidy Bruce  Date:  07/14/2024  Welcome to the Inpatient Rehabilitation Center.  Our goal is to provide you with an individualized program based on your diagnosis and situation, designed to meet your specific needs.  With this comprehensive rehabilitation program, you will be expected to participate in at least 3 hours of rehabilitation therapies Monday-Friday, with modified therapy programming on the weekends.  Your rehabilitation program will include the following services:  Physical Therapy (PT), Occupational Therapy (OT), Speech Therapy (ST), 24 hour per day rehabilitation nursing, Therapeutic Recreaction (TR), Neuropsychology, Care Coordinator, Rehabilitation Medicine, Nutrition Services, and Pharmacy Services  Weekly team conferences will be held on Wednesday to discuss your progress.  Your Inpatient Rehabilitation Care Coordinator will talk with you frequently to get your input and to update you on team discussions.  Team conferences with you and your family in attendance may also be held.  Expected length of stay: 10-12 days  Overall anticipated outcome: supervision with cues  Depending on your progress and recovery, your program may change. Your Inpatient Rehabilitation Care Coordinator will coordinate services and will keep you informed of any changes. Your Inpatient Rehabilitation Care Coordinator's name and contact numbers are listed  below.  The following services may also be recommended but are not provided by the Inpatient Rehabilitation Center:  Driving Evaluations Home Health Rehabiltiation Services Outpatient Rehabilitation Services Vocational Rehabilitation   Arrangements will be made to provide these services after discharge if needed.  Arrangements include referral to agencies that provide these services.  Your insurance has been verified to be:  None-medicaid pending Your primary doctor  is:  None  Pertinent information will be shared with your doctor and your insurance company.  Inpatient Rehabilitation Care Coordinator:  Rhoda Clement, KEN [phone] or ELIGAH BASQUES  Information discussed with and copy given to patient by: Clement Asberry MATSU, 07/14/2024, 1:35 PM

## 2024-07-14 NOTE — Progress Notes (Signed)
 Patient ID: Cassidy Bruce, female   DOB: 23-Dec-1995, 28 y.o.   MRN: [id number]  Asked to speak with Aunt who is here along with cousin regarding financial issues. Pt was present also. Aunt asked:  Are you all going to find a place for her to go at discharge? Worker asked what she meant by this? She reports pt and roommate live in a motel and Aunt feels and told by roommate where is she going to go? Tried to call roommate and she did not answer. Informed documented prior to being admitted to rehab was she is returning home with the roommate-Zenobia with Aunt's assist. Wylie says they are older and have health issues she resides in [place]. There is an aunt local. Pt does not live in a second floor apartment then. When asked pt she plans to go with Zenobia and back to the hotel, then decided to tune everybody out. Discussed with work on the plan and see how pt progresses. Pt plans to speak with Zenobia and see if there are concerns regarding her going back with her. Discussed with aunt, cousin and pt her options are limited due to uninsured and if can get independent can go to the shelter. Will work on discharge plans.

## 2024-07-14 NOTE — Plan of Care (Addendum)
 Psychiatry consulted and evaluated the patient. She denies SI, HI, or AVH. Patient reports good sleep last night and adequate appetite. She reports no issues with her current medications. We discussed continuing her current medication regimen and advised her to reach out to psychiatry if further assistance is needed. Psychiatry will sign off at this time. Elodie Palma, MD PGY-1   I have reviewed the note by Dr. Elodie, and discussed the case.  I am in agreement with the assessment and plan.  I was present during resident interview and have independently interviewed and assessed the patient.  Lillie is smiling and happy to be progressing with physical therapy.  She agrees to request psychiatric support if needed.  She is amenable to continuing psychotropic medication as prescribed.  We have encouraged her to seek outpatient mental health care for medication management and therapy after discharge. Resources can be provided at discharge.   DOROTHE CHRISTELLA GRIFFINS, MD   Total Time Spent in Direct Patient Care:  I personally spent 25 minutes on the unit in direct patient care. The direct patient care time included face-to-face time with the patient, reviewing the patient's chart, communicating with other professionals, and coordinating care. Greater than 50% of this time was spent in counseling or coordinating care with the patient regarding goals of hospitalization, psycho-education, and discharge planning needs.  Problem list has been updated.

## 2024-07-14 NOTE — Progress Notes (Signed)
 Inpatient Rehabilitation Admission Medication Review by a Pharmacist   A complete drug regimen review was completed for this patient to identify any potential clinically significant medication issues.   High Risk Drug Classes Is patient taking? Indication by Medication  Antipsychotic No    Anticoagulant No    Antibiotic No    Opioid Yes Oxycodone  - breakthrough pain Fioricet  - headache  Antiplatelet Yes Aspirin  81mg  and Plavix  x 3 months (stop date 10/04/24) , then Aspirin  alone - stroke prophylaxis  Hypoglycemics/insulin No    Vasoactive Medication No    Chemotherapy No    Other Yes Acetaminophen - pain   Citalopram  - mood Ferrous sulfate  - anemia Miralax  - constipation Rosuvastatin  - cholesterol Thiamine  - supplement        Type of Medication Issue Identified Description of Issue Recommendation(s)  Drug Interaction(s) (clinically significant)        Duplicate Therapy        Allergy        No Medication Administration End Date        Incorrect Dose        Additional Drug Therapy Needed        Significant med changes from prior encounter (inform family/care partners about these prior to discharge).     Other            Clinically significant medication issues were identified that warrant physician communication and completion of prescribed/recommended actions by midnight of the next day:  No   Name of provider notified for urgent issues identified:    Provider Method of Notification:      Pharmacist comments:    Time spent performing this drug regimen review (minutes):  20   Cassidy Bruce, [place] Clinical Pharmacist 07/14/2024 12:57 PM

## 2024-07-14 NOTE — Plan of Care (Signed)
  Problem: RH Swallowing Goal: LTG Patient will consume least restrictive diet using compensatory strategies with assistance (SLP) Description: LTG:  Patient will consume least restrictive diet using compensatory strategies with assistance (SLP) Flowsheets (Taken 07/14/2024 1332) LTG: Pt Patient will consume least restrictive diet using compensatory strategies with assistance of (SLP): Modified Independent   Problem: RH Expression Communication Goal: LTG Patient will verbally express basic/complex needs(SLP) Description: LTG:  Patient will verbally express basic/complex needs, wants or ideas with cues  (SLP) Flowsheets (Taken 07/14/2024 1332) LTG: Patient will verbally express basic/complex needs, wants or ideas (SLP): Supervision   Problem: RH Problem Solving Goal: LTG Patient will demonstrate problem solving for (SLP) Description: LTG:  Patient will demonstrate problem solving for basic/complex daily situations with cues  (SLP) Flowsheets (Taken 07/14/2024 1332) LTG: Patient will demonstrate problem solving for (SLP): (mildly complex) Other (comment) LTG Patient will demonstrate problem solving for: Supervision   Problem: RH Memory Goal: LTG Patient will use memory compensatory aids to (SLP) Description: LTG:  Patient will use memory compensatory aids to recall biographical/new, daily complex information with cues (SLP) Flowsheets (Taken 07/14/2024 1332) LTG: Patient will use memory compensatory aids to (SLP): Supervision   Problem: RH Awareness Goal: LTG: Patient will demonstrate awareness during functional activites type of (SLP) Description: LTG: Patient will demonstrate awareness during functional activites type of (SLP) Flowsheets (Taken 07/14/2024 1332) Patient will demonstrate during cognitive/linguistic activities awareness type of: Intellectual LTG: Patient will demonstrate awareness during cognitive/linguistic activities with assistance of (SLP): Supervision

## 2024-07-14 NOTE — Progress Notes (Signed)
 Inpatient Rehabilitation Care Coordinator Assessment and Plan Patient Details  Name: Cassidy Bruce MRN: [id number] Date of Birth: 11/23/1995  Today's Date: 07/14/2024  Hospital Problems: Principal Problem:   Thromboembolic stroke [facility])  Past Medical History:  Past Medical History:  Diagnosis Date   ADHD    Depression    Past Surgical History:  Past Surgical History:  Procedure Laterality Date   TRANSESOPHAGEAL ECHOCARDIOGRAM (CATH LAB) N/A 07/07/2024   Procedure: TRANSESOPHAGEAL ECHOCARDIOGRAM;  Surgeon: Lonni Slain, MD;  Location: [facility] INVASIVE CV LAB;  Service: Cardiovascular;  Laterality: N/A;   Social History:  reports that she has never smoked. She has never used smokeless tobacco. She reports that she does not drink alcohol and does not use drugs.  Family / Support Systems Marital Status: Single Patient Roles: Other (Comment) (niece, roommate, friend) Other Supports: Deborrah [phone]  Patty [phone] Anticipated Caregiver: roommate and aunt's Ability/Limitations of Caregiver: Family and roommate to work on plan for discharge. Caregiver Availability: 24/7 Family Dynamics: Close knit with roommate and aunt's. She calls Zenobia her bestie and feels she can really rely upon her. Her mom came from [place] to check on her. 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 Will try to include all if decision needs to be made whiel here   Abuse/Neglect Abuse/Neglect Assessment Can Be Completed: Yes Physical Abuse: Denies Verbal Abuse: Denies Sexual Abuse: Denies Exploitation of patient/patient's resources: Denies Self-Neglect: Denies  Patient response to: Social Isolation - How often do you feel lonely or isolated from those around you?: Rarely  Emotional Status Pt's affect, behavior and adjustment status: Pt is doing her best and feels has made progress since coming into the hospital. She hopes to get back to as independent as possible before leaving here. She was independent prior to admission and was driving and working. Recent Psychosocial Issues: other health issues will need PCP at [place] to follow up with Psychiatric History: Hhistory depression/ ADHD takes medications when comes into the hospital but then does not follow up with OP psych so stops taking it when it runs out. On board to see neuro-psych while here Substance Abuse History: Reports no issues and does not use  Patient / Family Perceptions, Expectations & Goals Pt/Family understanding of illness & functional limitations: Pt and roommate can explain her stroke and her deficits she has. Both have spoken with the MD's rounding and feel understand her treatment and plan going forward. Pt will need education to prevent another and to take care of her health when discharging Premorbid pt/family roles/activities: Niece, roommate, employee, friend, etc Anticipated changes in roles/activities/participation: resume Pt/family expectations/goals: Pt states:  I want to do good here and get better.  Dalia states:   I hope she does well  we will help her.  Community Resources [employer]: None Premorbid Home  Care/DME Agencies: None Transportation available at discharge: self Is the patient able to respond to transportation needs?: Yes In the past 12 months, has lack of transportation kept you from medical appointments or from getting medications?: No In the past 12 months, has lack of transportation kept you from meetings, work, or from getting things needed for daily living?: No Resource referrals recommended: Neuropsychology  Discharge Planning Living Arrangements: Non-relatives/Friends Support Systems: Other relatives, Friends/neighbors Type of Residence: Private residence Insurance Resources: Futures trader (Engineer, mining) Financial Resources: Employment Financial Screen Referred: Yes Living Expenses: Psychologist, sport and exercise Management: Patient, Other (Comment) Does the patient have any problems obtaining your medications?: Yes (Describe) (has no PCP) Home Management: both she and [name] Patient/Family Preliminary Plans: Return to the apatmtent with Zenobia she does not work. Between two aunt's and roommate she will have 24/7 care and hopefully can go up two flight of stairs to get to the apartment. There was talk about moving to first floor but have not done this or know status of this. Aware being evaluated today and goals being set for stay here Care Coordinator Barriers to Discharge: Home environment access/layout, Insurance for SNF coverage, Medication compliance Care Coordinator Anticipated Follow Up Needs: HH/OP  Clinical Impression Pleasant female who is still trying to process her health issues-ie CVA. Her roommate-Zenobia is very involved and supportive along with her aunt's. Awaiting evaluations and progress to see how pt does, but aware will need 24/7 care at discharge. Pt will need PCP and OP-Psych follow up. Has applied for medicaid and is asking about disability. Await team's evaluations.  Raymonde Asberry MATSU 07/14/2024, 1:33 PM

## 2024-07-14 NOTE — Progress Notes (Signed)
 PROGRESS NOTE   Subjective/Complaints: No new concerns this morning.  Reports pain is under control.  Reports she had a bowel movement the night before last.  ROS: Patient denies fever, new vision changes, dizziness, nausea, vomiting, diarrhea,  shortness of breath or chest pain, headache, or mood change.     Objective:   No results found. Recent Labs    07/13/24 0435 07/14/24 0503  WBC 5.9 5.8  HGB 12.3 13.1  HCT 36.2 39.1  PLT 116* 131*   Recent Labs    07/12/24 0426 07/14/24 0503  NA 142 141  K 3.7 3.5  CL 111 106  CO2 21* 28  GLUCOSE 77 84  BUN 9 8  CREATININE 0.72 0.82  CALCIUM  8.4* 8.8*   No intake or output data in the 24 hours ending 07/14/24 1231      Physical Exam: Vital Signs Blood pressure 119/76, pulse 62, temperature 98.7 F (37.1 C), resp. rate 17, height 5' 4 (1.626 m), weight 65.3 kg, SpO2 100%.  General: No apparent distress HEENT: Head is normocephalic, atraumatic, sclera anicteric, oral mucosa pink and moist, Heart: Reg rate and rhythm.  Chest: CTA bilaterally without wheezes, rales, or rhonchi; no distress Abdomen: Soft, non-tender, non-distended, bowel sounds positive. Extremities: No clubbing, cyanosis, or edema Psych: Pt's affect is appropriate. Pt is cooperative Skin: Clean and intact without signs of breakdown Neuro:  Patient is alert.  Sitting up in bed.  Makes eye contact with examiner.  Follows simple commands.  Differentiates left from right.  Hesitant speech with pseudo bulbar speech dysarthria.  LUE 4/5 throughout, strength is otherwise intact, sensation is intact in all 4 extremities    Assessment/Plan: 1. Functional deficits which require 3+ hours per day of interdisciplinary therapy in a comprehensive inpatient rehab setting. Physiatrist is providing close team supervision and 24 hour management of active medical problems listed below. Physiatrist and rehab team  continue to assess barriers to discharge/monitor patient progress toward functional and medical goals  Care Tool:  Bathing    Body parts bathed by patient: Right arm, Left arm, Chest, Abdomen, Front perineal area, Right upper leg, Left upper leg, Right lower leg, Left lower leg, Face, Buttocks   Body parts bathed by helper: Buttocks, Right arm     Bathing assist Assist Level: Minimal Assistance - Patient > 75%     Upper Body Dressing/Undressing Upper body dressing   What is the patient wearing?: Pull over shirt    Upper body assist Assist Level: Minimal Assistance - Patient > 75%    Lower Body Dressing/Undressing Lower body dressing      What is the patient wearing?: Pants, Underwear/pull up     Lower body assist Assist for lower body dressing: Moderate Assistance - Patient 50 - 74%     Toileting Toileting    Toileting assist Assist for toileting: Minimal Assistance - Patient > 75%     Transfers Chair/bed transfer  Transfers assist     Chair/bed transfer assist level: Moderate Assistance - Patient 50 - 74%     Locomotion Ambulation   Ambulation assist      Assist level: Moderate Assistance - Patient 50 - 74% Assistive  device: No Device Max distance: 150'   Walk 10 feet activity   Assist     Assist level: Moderate Assistance - Patient - 50 - 74% Assistive device: No Device   Walk 50 feet activity   Assist    Assist level: Moderate Assistance - Patient - 50 - 74% Assistive device: No Device    Walk 150 feet activity   Assist    Assist level: Moderate Assistance - Patient - 50 - 74% Assistive device: No Device    Walk 10 feet on uneven surface  activity   Assist     Assist level: Moderate Assistance - Patient - 50 - 74%     Wheelchair     Assist Is the patient using a wheelchair?: Yes Type of Wheelchair: Manual    Wheelchair assist level: Dependent - Patient 0% Max wheelchair distance: 150'    Wheelchair 50 feet  with 2 turns activity    Assist        Assist Level: Dependent - Patient 0%   Wheelchair 150 feet activity     Assist      Assist Level: Dependent - Patient 0%   Blood pressure 119/76, pulse 62, temperature 98.7 F (37.1 C), resp. rate 17, height 5' 4 (1.626 m), weight 65.3 kg, SpO2 100%.  Medical Problem List and Plan: 1. 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  Plan repeat CTA 2-3 months to evaluate for improvement dissection             -patient may shower             -ELOS/Goals: 10-12 days MinA             -Continue CIR   2.  Antithrombotics: -DVT/anticoagulation:  Pharmaceutical: Heparin  x 48 hours 07/12/19/2025             -antiplatelet therapy: continue Aspirin  81 mg daily and Plavix  75 mg daily x 3 months then aspirin  alone   3. Pain Management: continue Oxycodone /Fioricet  as needed   4. Mood/Behavior/Sleep/MDD/ADHD: continue Celexa  20 mg daily.  Provide emotional support.  Psychiatry consulted             -antipsychotic agents: N/A  - Patient was seen by psychology 9/11, continue current regimen and call back if further assistance needed.  Psychiatry is signed off   5. Neuropsych/cognition: This patient is not capable of making decisions on her own behalf.   6. Skin/Wound Care: Routine skin checks   7. Fluids/Electrolytes/Nutrition: Routine ins and outs with follow-up chemistries   8.  PFO.  TEE showed positive PFO, at rest LTR shunting and with Valsalva RTL shunting.  Follow-up outpatient cardiology service for question PFO closure   9.  Hyperlipidemia: continue Crestor  20 mg daily   10.  Microcytosis.  Low normal ferritin.  Placed on iron supplement  - 9/11 hemoglobin stable at 13.1, MCV 77.  Appears chronic.  Thalassemia workup?  11.  Transaminitis.  Improved, ALT mildly elevated at 47.  Continue to monitor  LOS: 1  days A FACE TO FACE EVALUATION WAS PERFORMED  Murray Collier 07/14/2024, 12:31 PM

## 2024-07-14 NOTE — Evaluation (Signed)
 Physical Therapy Assessment and Plan  Patient Details  Name: Cassidy Bruce MRN: [id number] Date of Birth: 1996-03-17  PT Diagnosis: Ataxic gait, Difficulty walking, Hemiplegia non-dominant, and Muscle weakness Rehab Potential: Good ELOS: 14-17 Days   Today's Date: 07/14/2024 PT Individual Time: 1106-1200 PT Individual Time Calculation (min): 54 min    Hospital Problem: Principal Problem:   Thromboembolic stroke [facility])   Past Medical History:  Past Medical History:  Diagnosis Date   ADHD    Depression    Past Surgical History:  Past Surgical History:  Procedure Laterality Date   TRANSESOPHAGEAL ECHOCARDIOGRAM (CATH LAB) N/A 07/07/2024   Procedure: TRANSESOPHAGEAL ECHOCARDIOGRAM;  Surgeon: Lonni Slain, MD;  Location: [facility] INVASIVE CV LAB;  Service: Cardiovascular;  Laterality: N/A;    Assessment & Plan Clinical Impression: Patient is a 28 year old right-handed African-American female with past medical history of ADHD/depression. On the prescription medications other than birth control. Per chart review patient lives with a friend. 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 They live in a second floor apartment. Plans to discharge home with friend and 2 aunts with good support. Family is checking on ground availability apartments. Presented 07/05/2024 with dizziness, slurred speech headache and gait abnormality as well as left side weakness. Per family she had went to the gym on Monday, 07/04/2024 for weightlifting as usual no specific problems. Tuesday morning she awoke from sleep with headache and neck pain and by the afternoon patient with increasing dizziness/vertigo as well as slurred speech. Cranial CT scan showed no acute intracranial abnormality new right cerebellar infarct since prior study of 2021 felt to be possibly chronic. 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 Distal V2 segment reconstitution but with attenuated enhancement  relative to the contralateral side along the remainder of its course. A 13 mL region of ischemia affecting both cerebellar hemispheres, worse on the left with no core infarct. MRI identified acute infarcts within the cerebellar vermis and bilateral cerebellar hemispheres. Most notably, large acute infarct present within the superior cerebellar artery territories bilaterally. Posterior fossa mass effect without cerebellar tonsillar herniation or evidence of obstructive hydrocephalus. Petechial hemorrhage within the cerebellar vermis and superior right cerebellar hemisphere. Patient did not receive TNK. No thrombectomy needed for left VA occlusion as BA was patent. Admission chemistries unremarkable except potassium 3.2, glucose 123, ALT 58, CK 284, urine drug screen negative, lactic acid within normal limits. Echocardiogram with ejection fraction of 60 to 65% no regional wall motion abnormalities. EEG negative for seizure. TEE completed bubble study with small PFO, at rest LTR shunting and with Valsalva RTL shunting and plan for follow-up outpatient with cardiology services. Venous Doppler studies negative for DVT. Patient initially placed on heparin  drip 9/8 x 48 hours transition to aspirin  81 mg daily and Plavix  75 mg daily for 3 months then aspirin  alone. Psychiatry consulted 07/13/2024 for history of major depressive disorder. Patient is tolerating a regular consistency diet.   Patient transferred to CIR on 07/13/2024 .   Patient currently requires mod with mobility secondary to muscle weakness, decreased cardiorespiratoy endurance, ataxia and decreased coordination, and decreased standing balance, decreased postural control, hemiplegia, and decreased balance strategies.  Prior to hospitalization, patient was independent  with mobility and lived with Friend(s) in a Apartment home.  Home access is 2 flightsStairs to enter.  Patient will benefit from skilled PT intervention to maximize safe functional mobility,  minimize fall risk, and decrease caregiver burden for planned discharge home with 24 hour supervision.  Anticipate patient will benefit from follow up OP at discharge.  PT - End of Session Activity Tolerance: Tolerates 30+ min activity with multiple rests Endurance Deficit: Yes PT Assessment Rehab Potential (ACUTE/IP ONLY): Good PT Barriers to Discharge: Home environment access/layout;Decreased caregiver support PT Patient demonstrates impairments in the following area(s): Balance;Endurance;Motor;Safety PT Transfers Functional Problem(s): Bed Mobility;Bed to Chair;Car;Furniture PT Locomotion Functional Problem(s): Ambulation;Stairs PT Plan PT Intensity: Minimum of 1-2 x/day ,45 to 90 minutes PT Frequency: 5 out of 7 days PT Duration Estimated Length of Stay: 14-17 Days PT Treatment/Interventions: Ambulation/gait training;Community reintegration;DME/adaptive equipment instruction;Neuromuscular re-education;Psychosocial support;Stair training;UE/LE Strength taining/ROM;Balance/vestibular training;Discharge planning;Therapeutic Activities;UE/LE Coordination activities;Cognitive remediation/compensation;Functional mobility training;Patient/family education;Therapeutic Exercise PT Transfers Anticipated Outcome(s): Supervision PT Locomotion Anticipated Outcome(s): Supervision PT Recommendation Recommendations for Other Services: Neuropsych consult;Therapeutic Recreation consult Therapeutic Recreation Interventions: Stress management;Outing/community reintergration Follow Up Recommendations: 24 hour supervision/assistance;Outpatient PT Patient destination: Home Equipment Recommended: To be determined   PT Evaluation Precautions/Restrictions Precautions Precautions: Fall Recall of Precautions/Restrictions: Intact Restrictions Weight Bearing Restrictions Per Provider Order: No General Chart Reviewed: Yes Family/Caregiver Present: No  Pain Interference Pain Interference Pain Effect on  Sleep: 1. Rarely or not at all Pain Interference with Therapy Activities: 1. Rarely or not at all Pain Interference with Day-to-Day Activities: 1. Rarely or not at all Home Living/Prior Functioning Home Living Type of Home: Apartment Home Access: Stairs to enter Entrance Stairs-Number of Steps: 2 flights Entrance Stairs-Rails: Can reach both;Right;Left Home Layout: One level Bathroom Shower/Tub: Engineer, manufacturing systems: Standard  Lives With: Friend(s) Prior Function Level of Independence: Independent with basic ADLs;Independent with homemaking with ambulation;Independent with gait;Independent with transfers  Able to Take Stairs?: Yes Driving: Yes Vocation: Full time employment Vision/Perception  Vision - History Ability to See in Adequate Light: 0 Adequate Vision - Assessment Eye Alignment: Within Functional Limits Ocular Range of Motion: Within Functional Limits Alignment/Gaze Preference: Within Defined Limits Tracking/Visual Pursuits: Able to track stimulus in all quads without difficulty Saccades: Within functional limits Convergence: Within functional limits Diplopia Assessment: Other (comment) (denies) Additional Comments: per chart review nystagmus previosuly noted, but not present on evaluation Perception Perception: Impaired Preception Impairment Details: Inattention/Neglect Perception-Other Comments: mild L inattention Praxis Praxis: Impaired Praxis Impairment Details: Motor planning  Cognition Overall Cognitive Status: Impaired/Different from baseline Arousal/Alertness: Awake/alert Attention: Sustained Sustained Attention: Appears intact Memory: Appears intact Awareness: Appears intact Problem Solving: Impaired (mild) Executive Function:  (presents to be intact but will continue to assess) Safety/Judgment: Impaired Sensation Sensation Light Touch: Appears Intact (presents to be intact but will continue to assess) Coordination Gross Motor Movements  are Fluid and Coordinated: No Fine Motor Movements are Fluid and Coordinated: No Coordination and Movement Description: slow un coordinated movements, can complete L hand opposition with max effort Finger Nose Finger Test: B UE ataxia, LUE>RUE, slow uncoordinated movements on left and over shooting Motor  Motor Motor: Hemiplegia;Ataxia;Motor impersistence Motor - Skilled Clinical Observations: L hemiplegia, slow, uncoordinated movements, can complete opposition with maximal effort, ataxia LUE>RUE  Trunk/Postural Assessment  Cervical Assessment Cervical Assessment: Within Functional Limits Thoracic Assessment Thoracic Assessment: Within Functional Limits Lumbar Assessment Lumbar Assessment: Within Functional Limits Postural Control Postural Control: Deficits on evaluation Trunk Control: decreased control Protective Responses: delayed  Balance Balance Balance Assessed: Yes Static Sitting Balance Static Sitting - Balance Support: Bilateral upper extremity supported;Feet supported Static Sitting - Level of Assistance: 5: Stand by assistance Dynamic Sitting Balance Dynamic Sitting - Balance Support: Feet supported Dynamic Sitting - Level of Assistance: 4: Min assist;5: Stand by assistance (CGA)  Static Standing Balance Static Standing - Balance Support: During functional activity;Bilateral upper extremity supported Static Standing - Level of Assistance: 4: Min assist Dynamic Standing Balance Dynamic Standing - Balance Support: During functional activity;Bilateral upper extremity supported Dynamic Standing - Level of Assistance: 4: Min assist Extremity Assessment  RUE Assessment RUE Assessment: Exceptions to [facility] General Strength Comments: 4/5 d/t general deconditioning LUE Assessment LUE Assessment: Exceptions to [facility] General Strength Comments: 3+/5 overall, slowed effortful movements, ataxia; can achieve full digit extension, however requires maximal effort RLE Assessment RLE  Assessment: Within Functional Limits LLE Assessment LLE Assessment: Within Functional Limits  Care Tool Care Tool Bed Mobility Roll left and right activity   Roll left and right assist level: Contact Guard/Touching assist    Sit to lying activity   Sit to lying assist level: Contact Guard/Touching assist    Lying to sitting on side of bed activity   Lying to sitting on side of bed assist level: the ability to move from lying on the back to sitting on the side of the bed with no back support.: Contact Guard/Touching assist     Care Tool Transfers Sit to stand transfer   Sit to stand assist level: Minimal Assistance - Patient > 75%    Chair/bed transfer   Chair/bed transfer assist level: Moderate Assistance - Patient 50 - 74%    Car transfer   Car transfer assist level: Moderate Assistance - Patient 50 - 74%      Care Tool Locomotion Ambulation   Assist level: Moderate Assistance - Patient 50 - 74% Assistive device: No Device Max distance: 150'  Walk 10 feet activity   Assist level: Moderate Assistance - Patient - 50 - 74% Assistive device: No Device   Walk 50 feet with 2 turns activity   Assist level: Moderate Assistance - Patient - 50 - 74% Assistive device: No Device  Walk 150 feet activity   Assist level: Moderate Assistance - Patient - 50 - 74% Assistive device: No Device  Walk 10 feet on uneven surfaces activity   Assist level: Moderate Assistance - Patient - 50 - 74%    Stairs   Assist level: Moderate Assistance - Patient - 50 - 74% Stairs assistive device: 2 hand rails Max number of stairs: 12  Walk up/down 1 step activity   Walk up/down 1 step (curb) assist level: Moderate Assistance - Patient - 50 - 74% Walk up/down 1 step or curb assistive device: 2 hand rails  Walk up/down 4 steps activity   Walk up/down 4 steps assist level: Moderate Assistance - Patient - 50 - 74% Walk up/down 4 steps assistive device: 2 hand rails  Walk up/down 12 steps activity    Walk up/down 12 steps assist level: Moderate Assistance - Patient - 50 - 74% Walk up/down 12 steps assistive device: 2 hand rails  Pick up small objects from floor   Pick up small object from the floor assist level: Moderate Assistance - Patient 50 - 74%    Wheelchair Is the patient using a wheelchair?: Yes Type of Wheelchair: Manual   Wheelchair assist level: Dependent - Patient 0% Max wheelchair distance: 150'  Wheel 50 feet with 2 turns activity   Assist Level: Dependent - Patient 0%  Wheel 150 feet activity   Assist Level: Dependent - Patient 0%    Refer to Care Plan for Long Term Goals  SHORT TERM GOAL WEEK 1 PT Short Term Goal 1 (Week 1): Pt will perform sit to stand with  CGA consistently. PT Short Term Goal 2 (Week 1): Pt will perform bed to chair with CGA consistently. PT Short Term Goal 3 (Week 1): Pt will ambulate x150' with CGA and LRAD. PT Short Term Goal 4 (Week 1): Pt will complete balance assessment.  Recommendations for other services: Neuropsych and Therapeutic Recreation  Stress management and Outing/community reintegration  Skilled Therapeutic Intervention  Evaluation completed (see details above and below) with education on PT POC and goals and individual treatment initiated with focus on balance, transfers, ambulation, car transfer, and stair training. Pt received supine in bed asleep. Awakens to verbal and tactile stimuli and agrees to therapy. No complaint of pain. Supine to sit with CGA and cues for sequencing and positioning. Pt performs stand pivot transfer to [facility] with modA and cues for initiation, sequencing and positioning. WC transport to gym. Pt stands and ambulates x150' without AD, with modA and notable ataxia bilaterally but Lt>Rt. PT provides cues to prevent hyperextension of knees during loading phase, as well as cues for trunk stability and lateral weight shifting. Seated rest break. Pt completes x12 6 steps with bilateral handrails and modA, with  cues for postural control, step sequencing, and safety. Pt completes ramp navigation and car transfer with modA and cues for sequencing. Pt attempts Berg balance test and becomes very emotional when she has difficulty performing some of the tasks, requesting to lie down. PT provides active listening and education on prognosis for functional recovery. Pt then performs stand step from mat>WC>bed with modA and same cues. Left supine with all needs within reach.   Mobility Bed Mobility Bed Mobility: Rolling Right;Rolling Left;Supine to Sit;Sit to Supine Rolling Right: Contact Guard/Touching assist Rolling Left: Contact Guard/Touching assist Supine to Sit: Contact Guard/Touching assist Sit to Supine: Contact Guard/Touching assist Transfers Transfers: Sit to Stand;Stand to Sit Sit to Stand: Minimal Assistance - Patient > 75% Stand to Sit: Minimal Assistance - Patient > 75% Transfer (Assistive device): 1 person hand held assist Locomotion  Gait Ambulation: Yes Gait Assistance: Moderate Assistance - Patient 50-74% Gait Distance (Feet): 150 Feet Assistive device: None Gait Assistance Details: Verbal cues for gait pattern;Verbal cues for technique;Tactile cues for sequencing Gait Gait: Yes Gait Pattern: Impaired Gait Pattern: Ataxic;Decreased stride length Gait velocity: decreased Stairs / Additional Locomotion Stairs: Yes Stairs Assistance: Moderate Assistance - Patient 50 - 74% Stair Management Technique: Two rails Number of Stairs: 12 Height of Stairs: 6 Ramp: Moderate Assistance - Patient 50 - 74% Curb: Moderate Assistance - Patient 50 - 74% Wheelchair Mobility Wheelchair Mobility: No   Discharge Criteria: Patient will be discharged from PT if patient refuses treatment 3 consecutive times without medical reason, if treatment goals not met, if there is a change in medical status, if patient makes no progress towards goals or if patient is discharged from hospital.  The above  assessment, treatment plan, treatment alternatives and goals were discussed and mutually agreed upon: by patient  Elsie JAYSON Dawn, PT, DPT 07/14/2024, 5:09 PM

## 2024-07-14 NOTE — Progress Notes (Signed)
 Inpatient Rehabilitation  Patient information reviewed and entered into eRehab system by Feliberto Gottron, M.A., CCC-SLP, Rehab Quality Coordinator.  Information including medical coding, functional ability and quality indicators will be reviewed and updated through discharge.

## 2024-07-14 NOTE — Evaluation (Signed)
 Speech Language Pathology Assessment and Plan  Patient Details  Name: Cassidy Bruce MRN: [id number] Date of Birth: 16-May-1996  SLP Diagnosis: Dysarthria;Dysphagia;Cognitive Impairments  Rehab Potential: Good ELOS: 14-16 days    Today's Date: 07/14/2024 SLP Individual Time: 1230-1330 SLP Individual Time Calculation (min): 60 min   Hospital Problem: Principal Problem:   Thromboembolic stroke [facility])  Past Medical History:  Past Medical History:  Diagnosis Date   ADHD    Depression    Past Surgical History:  Past Surgical History:  Procedure Laterality Date   TRANSESOPHAGEAL ECHOCARDIOGRAM (CATH LAB) N/A 07/07/2024   Procedure: TRANSESOPHAGEAL ECHOCARDIOGRAM;  Surgeon: Lonni Slain, MD;  Location: [facility] INVASIVE CV LAB;  Service: Cardiovascular;  Laterality: N/A;    Assessment / Plan / Recommendation Clinical Impression HPI: Cassidy Bruce is a 28 y/o female with PMH ADHD/depression who presented to [facility] on 07/05/2024 with dizziness, slurred speech, headache, gait abnormality, as well as left side weakness.  Cranial CT scan showed no acute intracranial abnormality but new right cerebellar infarct since prior study of 2021 felt to be possibly chronic.  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  Distal V2 segment reconstitution but with attenuated enhancement relative to the contralateral side along the remainder of its course.  A 13 mL region of ischemia affecting both cerebellar hemispheres, worse on the left with no core infarct.  MRI identified acute infarcts within the cerebellar vermis and bilateral cerebellar hemispheres.  Most notably, large acute infarct present within the superior cerebellar artery territories bilaterally.  Posterior fossa mass effect without cerebellar tonsillar herniation or evidence of obstructive hydrocephalus.  Petechial hemorrhage within the cerebellar vermis and superior right cerebellar hemisphere.  Patient did not receive TNK.  No  thrombectomy needed for left VA occlusion as BA was patent.  Admission chemistries unremarkable except potassium 3.2, glucose 123, ALT 58, CK 284, urine drug screen negative, lactic acid within normal limits.  Echocardiogram with ejection fraction of 60 to 65% no regional wall motion abnormalities.  EEG negative for seizure.  TEE completed bubble study with small PFO, at rest LTR shunting and with Valsalva RTL shunting and plan for follow-up outpatient with cardiology services.  Venous Doppler studies negative for DVT.  Patient initially placed on heparin  drip 9/8 x 48 hours transition to aspirin  81 mg daily and Plavix  75 mg daily for 3 months then aspirin  alone.  Psychiatry consulted 07/13/2024 for history of major depressive disorder.  Patient is tolerating a regular consistency diet.  Therapy evaluations completed and pt was recommended for a comprehensive rehab program   Clinical Impression:  Bedside Swallow Evaluation: A bedside swallow evaluation was completed to assess for s/sx of oropharyngeal dysphagia. Oral mechanism exam WFL for mastication. POs administered included thin liquids, puree and solids. Patient with timely mastication and mild oral residuals cleared through liquid wash. Patient with x2 immediate coughs after thin liquids via straw, though no other s/sx of aspiration. Patient denied further trials of liquid at this time, therefore no compensatory strategies were observed. Recommend regular/thin diet per MBS with use of standardized precautions including sitting upright during PO, taking small bites/sips at a slow rate and no straws. Recommend intermittent supervision during mealtimes.  Cognitive-Linguistic: Patient was evaluated via the Cognistat to assess cognitive linguistic skills. Patient with strengths in orientation, attention, visual spatial skills and expressive/receptive language. Patient with mild deficits in problem solving (executive functioning, calculations) and delayed recall.  Informally observed deficits in intellectual awareness of cognitive deficits.  SLP further evaluated patients expressive/receptive language  skills in which she answered yes/no questions with 100% accuracy, followed 1-3 step commands with 100% accuracy and named all functional and abstract objects correctly.  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 Speech is characterized by articulatory errors, impaired pacing with extra pausing between words/syllables, and impaired resonance.  Pt would benefit from skilled ST services to maximize dysphagia, cognition and communication in order to maximize functional independence at d/c. Anticipate patient will require supervision at d/c and f/u SLP services.    Skilled Therapeutic Interventions          Patient evaluated using a standardized cognitive linguistic assessment and bedside swallow evaluation to assess current cognitive, communicative and swallowing function. See above for details.    SLP Assessment  Patient will need skilled Speech Lanaguage Pathology Services during CIR admission    Recommendations  SLP Diet Recommendations: Age appropriate regular solids;Thin Liquid Administration via: Cup Medication Administration: Whole meds with puree Supervision: Intermittent supervision to cue for compensatory strategies Compensations: Slow rate;Small sips/bites;Follow solids with liquid Postural Changes and/or Swallow Maneuvers: Seated upright 90 degrees Oral Care Recommendations: Oral care BID Recommendations for Other Services: Neuropsych consult Patient destination: Home Follow up Recommendations: Outpatient SLP Equipment Recommended: None recommended by SLP    SLP Frequency 3 to 5 out of 7 days   SLP Duration  SLP Intensity  SLP Treatment/Interventions 14-16 days  Minumum of 1-2 x/day, 30 to 90 minutes  Cognitive remediation/compensation;Dysphagia/aspiration precaution  training;Internal/external aids;Speech/Language facilitation;Cueing hierarchy;Therapeutic Activities;Functional tasks;Multimodal communication approach;Patient/family education    Pain None reported   SLP Evaluation Cognition Overall Cognitive Status: Impaired/Different from baseline Arousal/Alertness: Awake/alert Orientation Level: Oriented X4 Year: 2025 Month: September Day of Week: Correct Attention: Sustained Sustained Attention: Appears intact Memory: Impaired Memory Impairment: Decreased short term memory Decreased Short Term Memory: Verbal basic Awareness: Impaired Awareness Impairment: Intellectual impairment Problem Solving: Impaired Problem Solving Impairment: Functional complex;Verbal complex Executive Function:  (presents to be intact but will continue to assess) Safety/Judgment: Impaired  Comprehension Auditory Comprehension Overall Auditory Comprehension: Appears within functional limits for tasks assessed Yes/No Questions: Within Functional Limits Commands: Within Functional Limits Expression Expression Primary Mode of Expression: Verbal Verbal Expression Overall Verbal Expression: Appears within functional limits for tasks assessed (ataxic dysarthria) Written Expression Dominant Hand: Right Oral Motor Oral Motor/Sensory Function Overall Oral Motor/Sensory Function: Within functional limits Motor Speech Overall Motor Speech: Impaired (most consistent with ataxic dysarthria) Respiration: Within functional limits Phonation: Other (comment) (monotone) Resonance: Hypernasality Articulation: Impaired Level of Impairment: Word Intelligibility: Intelligibility reduced Word: 75-100% accurate Phrase: 75-100% accurate Sentence: 75-100% accurate Conversation: 75-100% accurate Effective Techniques: Slow rate;Pause;Pacing  Care Tool Care Tool Cognition Ability to hear (with hearing aid or hearing appliances if normally used Ability to hear (with hearing aid or  hearing appliances if normally used): 0. Adequate - no difficulty in normal conservation, social interaction, listening to TV   Expression of Ideas and Wants Expression of Ideas and Wants: 3. Some difficulty - exhibits some difficulty with expressing needs and ideas (e.g, some words or finishing thoughts) or speech is not clear   Understanding Verbal and Non-Verbal Content Understanding Verbal and Non-Verbal Content: 4. Understands (complex and basic) - clear comprehension without cues or repetitions  Memory/Recall Ability Memory/Recall Ability : Current season;Location of own room;Staff names and faces;That he or she is in a hospital/hospital unit    Bedside Swallowing Assessment General Previous Swallow Assessment: 9/9 Diet Prior to this Study: Regular;Thin liquids (Level 0) Respiratory Status: Room air History of Recent  Intubation: No Behavior/Cognition: Alert;Cooperative;Requires cueing Oral Cavity - Dentition: Adequate natural dentition Self-Feeding Abilities: Needs assist Vision: Functional for self-feeding Patient Positioning: Upright in bed Volitional Cough: Strong Volitional Swallow: Able to elicit  Ice Chips Ice chips: Not tested Thin Liquid Thin Liquid: Impaired Presentation: Self Fed;Straw Pharyngeal  Phase Impairments: Cough - Immediate Nectar Thick Nectar Thick Liquid: Not tested Honey Thick Honey Thick Liquid: Not tested Puree Puree: Impaired Presentation: Self Fed;Spoon Oral Phase Functional Implications: Oral residue Solid Solid: Within functional limits Presentation: Self Fed BSE Assessment Risk for Aspiration Impact on safety and function: Mild aspiration risk Other Related Risk Factors: Cognitive impairment  Short Term Goals: Week 1: SLP Short Term Goal 1 (Week 1): Patient will recall and utilize memory strategies with min multimodal A SLP Short Term Goal 2 (Week 1): Patient will recall cognitive deficits with min multimodal A SLP Short Term Goal 3  (Week 1): Patient will demonstrate mildly complex problem solving skills with min multimodal A SLP Short Term Goal 4 (Week 1): Patient will consume least restrictive diet with use of safe swallowing strategies given supervision verbal A SLP Short Term Goal 5 (Week 1): Patient will utilize dysarthria strategies to increase fluency given min multimodal A  Refer to Care Plan for Long Term Goals  Recommendations for other services: Neuropsych  Discharge Criteria: Patient will be discharged from SLP if patient refuses treatment 3 consecutive times without medical reason, if treatment goals not met, if there is a change in medical status, if patient makes no progress towards goals or if patient is discharged from hospital.  The above assessment, treatment plan, treatment alternatives and goals were discussed and mutually agreed upon: by patient  Moselle Rister M.A., CCC-SLP 07/14/2024, 1:23 PM

## 2024-07-14 NOTE — Evaluation (Signed)
 Occupational Therapy Assessment and Plan  Patient Details  Name: Cassidy Bruce MRN: [id number] Date of Birth: [birth year]  OT Diagnosis: ataxia, cognitive deficits, hemiplegia affecting non-dominant side, and muscle weakness (generalized) Rehab Potential: Rehab Potential (ACUTE ONLY): Good ELOS: 10-12 days   Today's Date: 07/14/2024 OT Individual Time: 9084-8967 OT Individual Time Calculation (min): 77 min     Hospital Problem: Principal Problem:   Thromboembolic stroke [facility])   Past Medical History:  Past Medical History:  Diagnosis Date   ADHD    Depression    Past Surgical History:  Past Surgical History:  Procedure Laterality Date   TRANSESOPHAGEAL ECHOCARDIOGRAM (CATH LAB) N/A 07/07/2024   Procedure: TRANSESOPHAGEAL ECHOCARDIOGRAM;  Surgeon: Lonni Slain, MD;  Location: [facility] INVASIVE CV LAB;  Service: Cardiovascular;  Laterality: N/A;    Assessment & Plan Clinical Impression: Cassidy Bruce is a 28 year old right-handed African-American female with past medical history of ADHD/depression. On the prescription medications other than birth control. Per chart review patient lives with a friend. 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 They live in a second floor apartment. Plans to discharge home with friend and 2 aunts with good support. Family is checking on ground availability apartments. Presented 07/05/2024 with dizziness, slurred speech headache and gait abnormality as well as left side weakness. Per family she had went to the gym on Monday, 07/04/2024 for weightlifting as usual no specific problems. Tuesday morning she awoke from sleep with headache and neck pain and by the afternoon patient with increasing dizziness/vertigo as well as slurred speech. Cranial CT scan showed no acute intracranial abnormality new right cerebellar infarct since prior study of 2021 felt to be possibly chronic. 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 Distal V2 segment reconstitution but with attenuated enhancement relative to the contralateral side along the remainder of its course. A 13 mL region of ischemia affecting both cerebellar hemispheres, worse on the left with no core infarct. MRI identified acute infarcts within the cerebellar vermis and bilateral cerebellar hemispheres. Most notably, large acute infarct present within the superior cerebellar artery territories bilaterally. Posterior fossa mass effect without cerebellar tonsillar herniation or evidence of obstructive hydrocephalus. Petechial hemorrhage within the cerebellar vermis and superior right cerebellar hemisphere. Patient did not receive TNK. No thrombectomy needed for left VA occlusion as BA was patent. Admission chemistries unremarkable except potassium 3.2, glucose 123, ALT 58, CK 284, urine drug screen negative, lactic acid within normal limits. Echocardiogram with ejection fraction of 60 to 65% no regional wall motion abnormalities. EEG negative for seizure. TEE completed bubble study with small PFO, at rest LTR shunting and with Valsalva RTL shunting and plan for follow-up outpatient with cardiology services. Venous Doppler studies negative for DVT. Patient initially placed on heparin  drip 9/8 x 48 hours transition to aspirin  81 mg daily and Plavix  75 mg daily for 3 months then aspirin  alone. Psychiatry consulted 07/13/2024 for history of major depressive disorder. Patient is tolerating a regular consistency diet. Patient transferred to CIR on 07/13/2024 .    Patient currently requires MIN-MOD A with basic self-care skills and IADL secondary to muscle weakness, decreased cardiorespiratoy endurance, impaired timing and sequencing, unbalanced muscle activation, ataxia, decreased coordination, and decreased motor planning, decreased attention to left, decreased initiation, decreased problem solving, and decreased safety awareness, central origin, and decreased sitting balance,  decreased standing balance, decreased postural control, hemiplegia, and decreased balance strategies.  Prior to hospitalization, patient could complete ADLs and IADLs with independent .  Patient  will benefit from skilled intervention to decrease level of assist with basic self-care skills, increase independence with basic self-care skills, and increase level of independence with iADL prior to discharge home with care partner.  Anticipate patient will require 24 hour supervision and follow up outpatient.  OT - End of Session Activity Tolerance: Decreased this session Endurance Deficit: Yes OT Assessment Rehab Potential (ACUTE ONLY): Good OT Barriers to Discharge: None OT Patient demonstrates impairments in the following area(s): Balance;Cognition;Endurance;Motor;Pain;Perception;Safety;Sensory;Skin Integrity;Vision;Behavior OT Basic ADL's Functional Problem(s): Grooming;Bathing;Dressing;Toileting OT Advanced ADL's Functional Problem(s): Laundry OT Transfers Functional Problem(s): Toilet;Tub/Shower OT Additional Impairment(s): Fuctional Use of Upper Extremity OT Plan OT Intensity: Minimum of 1-2 x/day, 45 to 90 minutes OT Frequency: 5 out of 7 days OT Duration/Estimated Length of Stay: 14-17 days OT Treatment/Interventions: Balance/vestibular training;DME/adaptive equipment instruction;Patient/family education;Therapeutic Activities;Cognitive remediation/compensation;Functional electrical stimulation;Psychosocial support;Therapeutic Exercise;Community reintegration;Functional mobility training;Self Care/advanced ADL retraining;UE/LE Strength taining/ROM;Discharge planning;Neuromuscular re-education;Skin care/wound managment;UE/LE Coordination activities;Disease mangement/prevention;Pain management;Splinting/orthotics;Visual/perceptual remediation/compensation OT Self Feeding Anticipated Outcome(s): Mod I OT Basic Self-Care Anticipated Outcome(s): SUP OT Toileting Anticipated Outcome(s): SUP OT  Bathroom Transfers Anticipated Outcome(s): SUP OT Recommendation Recommendations for Other Services: Therapeutic Recreation consult Therapeutic Recreation Interventions: Pet therapy;Stress management;Outing/community reintergration Patient destination: Home Follow Up Recommendations: Outpatient OT Equipment Recommended: To be determined   OT Evaluation Precautions/Restrictions  Precautions Precautions: Fall Recall of Precautions/Restrictions: Intact Restrictions Weight Bearing Restrictions Per Provider Order: No General Chart Reviewed: Yes Additional Pertinent History: no significant medical hx noted. hx of ADHD and MDD Family/Caregiver Present: No Vital Signs Therapy Vitals Temp: 98.7 F (37.1 C) Pulse Rate: 62 Resp: 17 BP: 119/76 Patient Position (if appropriate): Lying Oxygen Therapy SpO2: 100 % O2 Device: Room Air Pain Pain Assessment Pain Scale: 0-10 Pain Score: 0-No pain Home Living/Prior Functioning Home Living Family/patient expects to be discharged to:: Private residence Living Arrangements: Non-relatives/Friends Type of Home: Apartment Home Access: Stairs to enter Secretary/administrator of Steps: 2 flights Entrance Stairs-Rails: Can reach both, Right, Left Home Layout: One level Bathroom Shower/Tub: Engineer, manufacturing systems: Standard  Lives With: Friend(s) IADL History Homemaking Responsibilities: Yes Meal Prep Responsibility: Secondary Laundry Responsibility: Primary Cleaning Responsibility: Secondary Bill Paying/Finance Responsibility: Primary Shopping Responsibility: Primary Child Care Responsibility: No Current License: Yes Mode of Transportation: Car Education: some college Occupation: Consulting civil engineer, Full time employment Type of Occupation: Counsellor IADL Comments: roommate and family can assist PRN Prior Function Level of Independence: Independent with basic ADLs, Independent with homemaking with ambulation, Independent with gait,  Independent with transfers  Able to Take Stairs?: Yes Driving: Yes Vocation: Full time employment Vision Baseline Vision/History: 0 No visual deficits Ability to See in Adequate Light: 0 Adequate Patient Visual Report: No change from baseline Vision Assessment?: Yes Eye Alignment: Within Functional Limits Ocular Range of Motion: Within Functional Limits Alignment/Gaze Preference: Within Defined Limits Tracking/Visual Pursuits: Able to track stimulus in all quads without difficulty Saccades: Within functional limits Convergence: Within functional limits Visual Fields: No apparent deficits Diplopia Assessment: Other (comment) (denies) Depth Perception: Overshoots Additional Comments: per chart review nystagmus previosuly noted, but not present on evaluation Perception  Perception: Impaired Perception-Other Comments: mild L inattention Praxis Praxis: Impaired Praxis Impairment Details: Motor planning Cognition Cognition Overall Cognitive Status: Impaired/Different from baseline Arousal/Alertness: Awake/alert Orientation Level: Person;Place;Situation Person: Oriented Place: Oriented Situation: Oriented Memory: Appears intact Attention: Sustained Sustained Attention: Appears intact Awareness: Appears intact Problem Solving: Impaired (mild) Executive Function:  (presents to be intact but will continue to assess) Safety/Judgment: Impaired Brief Interview for Mental Status (BIMS) Repetition of Three  Words (First Attempt): 3 Temporal Orientation: Year: Correct Temporal Orientation: Month: Accurate within 5 days Temporal Orientation: Day: Correct Recall: Sock: Yes, no cue required Recall: Blue: Yes, no cue required Recall: Bed: Yes, no cue required BIMS Summary Score: 15 Sensation Sensation Light Touch: Appears Intact (presents to be intact but will continue to assess) Coordination Gross Motor Movements are Fluid and Coordinated: No Fine Motor Movements are Fluid and  Coordinated: No Coordination and Movement Description: slow un coordinated movements, can complete L hand opposition with max effort Finger Nose Finger Test: B UE ataxia, LUE>RUE, slow uncoordinated movements on left and over shooting Motor  Motor Motor: Hemiplegia;Ataxia;Motor impersistence Motor - Skilled Clinical Observations: L hemiplegia, slow, uncoordinated movements, can complete opposition with maximal effort, ataxia LUE>RUE  Trunk/Postural Assessment  Cervical Assessment Cervical Assessment: Within Functional Limits Thoracic Assessment Thoracic Assessment: Within Functional Limits Lumbar Assessment Lumbar Assessment: Within Functional Limits Postural Control Postural Control: Deficits on evaluation Trunk Control: decreased control Protective Responses: delayed  Balance Balance Balance Assessed: Yes Static Sitting Balance Static Sitting - Balance Support: Bilateral upper extremity supported;Feet supported Static Sitting - Level of Assistance: 5: Stand by assistance Dynamic Sitting Balance Dynamic Sitting - Balance Support: Feet supported Dynamic Sitting - Level of Assistance: 4: Min assist;5: Stand by assistance (CGA) Static Standing Balance Static Standing - Balance Support: During functional activity;Bilateral upper extremity supported Static Standing - Level of Assistance: 4: Min assist Dynamic Standing Balance Dynamic Standing - Balance Support: During functional activity;Bilateral upper extremity supported Dynamic Standing - Level of Assistance: 4: Min assist Extremity/Trunk Assessment RUE Assessment RUE Assessment: Exceptions to [facility] General Strength Comments: 4/5 d/t general deconditioning LUE Assessment LUE Assessment: Exceptions to [facility] General Strength Comments: 3+/5 overall, slowed effortful movements, ataxia; can achieve full digit extension, however requires maximal effort  Care Tool Care Tool Self Care Eating   Eating Assist Level: Set up assist     Oral Care    Oral Care Assist Level: Contact Guard/Toucning assist    Bathing   Body parts bathed by patient: Right arm;Left arm;Chest;Abdomen;Front perineal area;Right upper leg;Left upper leg;Right lower leg;Left lower leg;Face;Buttocks Body parts bathed by helper: Buttocks;Right arm   Assist Level: Minimal Assistance - Patient > 75%    Upper Body Dressing(including orthotics)   What is the patient wearing?: Pull over shirt   Assist Level: Minimal Assistance - Patient > 75%    Lower Body Dressing (excluding footwear)   What is the patient wearing?: Pants;Underwear/pull up Assist for lower body dressing: Moderate Assistance - Patient 50 - 74%    Putting on/Taking off footwear   What is the patient wearing?: Non-skid slipper socks Assist for footwear: Minimal Assistance - Patient > 75%       Care Tool Toileting Toileting activity   Assist for toileting: Minimal Assistance - Patient > 75%     Care Tool Bed Mobility Roll left and right activity   Roll left and right assist level: Contact Guard/Touching assist    Sit to lying activity   Sit to lying assist level: Contact Guard/Touching assist    Lying to sitting on side of bed activity   Lying to sitting on side of bed assist level: the ability to move from lying on the back to sitting on the side of the bed with no back support.: Contact Guard/Touching assist     Care Tool Transfers Sit to stand transfer   Sit to stand assist level: Minimal Assistance - Patient > 75%    Chair/bed  transfer   Chair/bed transfer assist level: Minimal Assistance - Patient > 75%     Toilet transfer   Assist Level: Minimal Assistance - Patient > 75%     Care Tool Cognition  Expression of Ideas and Wants Expression of Ideas and Wants: 3. Some difficulty - exhibits some difficulty with expressing needs and ideas (e.g, some words or finishing thoughts) or speech is not clear  Understanding Verbal and Non-Verbal Content Understanding Verbal  and Non-Verbal Content: 4. Understands (complex and basic) - clear comprehension without cues or repetitions   Memory/Recall Ability Memory/Recall Ability : Current season;Location of own room;Staff names and faces;That he or she is in a hospital/hospital unit   Refer to Care Plan for Long Term Goals  SHORT TERM GOAL WEEK 1 OT Short Term Goal 1 (Week 1): Pt will complete U/LB bathing with CGA OT Short Term Goal 2 (Week 1): Pt will maintain L UE in safe position during bed mobility with MIN verbal cues OT Short Term Goal 3 (Week 1): Pt will recall hemi dressing techniques with MIN verbal cues OT Short Term Goal 4 (Week 1): Pt will complete shower transfer with CGA using LRAD  Recommendations for other services: Therapeutic Recreation  Pet therapy, Stress management, and Outing/community reintegration   Skilled Therapeutic Intervention Skilled Therapeutic Interventions/Progress Updates: 1:1 OT evaluation and intervention initiated with skilled education provided on OT role, goals, and POC. Pt received in bed presenting to be in good spirits receptive to skilled OT session reporting 0/10 pain- OT offering intermittent rest breaks, repositioning, and therapeutic support to optimize participation in therapy session. Pt completed morning routine, including bathing, grooming, and dressing at the levels listed below. Provided multimodal cues for safety, RW management, and L U/LE positioning. Pt was left resting in bed with call bell in reach, bed alarm on, and all needs met.     ADL ADL Eating: Set up (per Pt report and chart review) Where Assessed-Eating: Bed level Grooming: Minimal assistance Where Assessed-Grooming: Edge of bed Upper Body Bathing: Minimal assistance Where Assessed-Upper Body Bathing: Shower (sitting on TTB) Lower Body Bathing: Minimal assistance Where Assessed-Lower Body Bathing: Shower (sitting on TTB) Upper Body Dressing: Minimal assistance Where Assessed-Upper Body  Dressing: Edge of bed Lower Body Dressing: Moderate assistance Where Assessed-Lower Body Dressing: Edge of bed Toileting: Minimal assistance Where Assessed-Toileting: Teacher, adult education: Minimal assistance (HHA) Toilet Transfer Method: Ambulating;Stand pivot Acupuncturist: Engineer, technical sales: Not assessed Film/video editor: Insurance underwriter Method: Designer, industrial/product: Event organiser  Bed Mobility Bed Mobility: Rolling Right;Rolling Left;Supine to Sit;Sit to Supine Rolling Right: Contact Guard/Touching assist Rolling Left: Contact Guard/Touching assist Supine to Sit: Contact Guard/Touching assist Sit to Supine: Contact Guard/Touching assist Transfers Sit to Stand: Minimal Assistance - Patient > 75% Stand to Sit: Minimal Assistance - Patient > 75%   Discharge Criteria: Patient will be discharged from OT if patient refuses treatment 3 consecutive times without medical reason, if treatment goals not met, if there is a change in medical status, if patient makes no progress towards goals or if patient is discharged from hospital.  The above assessment, treatment plan, treatment alternatives and goals were discussed and mutually agreed upon: by patient  Cassidy Bruce 07/14/2024, 10:32 AM

## 2024-07-14 NOTE — Plan of Care (Signed)
  Problem: RH Balance Goal: LTG Patient will maintain dynamic standing with ADLs (OT) Description: LTG:  Patient will maintain dynamic standing balance with assist during activities of daily living (OT)  Flowsheets (Taken 07/14/2024 1244) LTG: Pt will maintain dynamic standing balance during ADLs with: Supervision/Verbal cueing   Problem: RH Grooming Goal: LTG Patient will perform grooming w/assist,cues/equip (OT) Description: LTG: Patient will perform grooming with assist, with/without cues using equipment (OT) Flowsheets (Taken 07/14/2024 1244) LTG: Pt will perform grooming with assistance level of: Independent with assistive device    Problem: RH Bathing Goal: LTG Patient will bathe all body parts with assist levels (OT) Description: LTG: Patient will bathe all body parts with assist levels (OT) Flowsheets (Taken 07/14/2024 1244) LTG: Pt will perform bathing with assistance level/cueing: Supervision/Verbal cueing LTG: Position pt will perform bathing: Shower   Problem: RH Dressing Goal: LTG Patient will perform upper body dressing (OT) Description: LTG Patient will perform upper body dressing with assist, with/without cues (OT). Flowsheets (Taken 07/14/2024 1244) LTG: Pt will perform upper body dressing with assistance level of: Supervision/Verbal cueing Goal: LTG Patient will perform lower body dressing w/assist (OT) Description: LTG: Patient will perform lower body dressing with assist, with/without cues in positioning using equipment (OT) Flowsheets (Taken 07/14/2024 1244) LTG: Pt will perform lower body dressing with assistance level of: Supervision/Verbal cueing   Problem: RH Toileting Goal: LTG Patient will perform toileting task (3/3 steps) with assistance level (OT) Description: LTG: Patient will perform toileting task (3/3 steps) with assistance level (OT)  Flowsheets (Taken 07/14/2024 1244) LTG: Pt will perform toileting task (3/3 steps) with assistance level:  Supervision/Verbal cueing   Problem: RH Functional Use of Upper Extremity Goal: LTG Patient will use RT/LT upper extremity as a (OT) Description: LTG: Patient will use right/left upper extremity as a stabilizer/gross assist/diminished/nondominant/dominant level with assist, with/without cues during functional activity (OT) Flowsheets (Taken 07/14/2024 1244) LTG: Use of upper extremity in functional activities: LUE as diminished level LTG: Pt will use upper extremity in functional activity with assistance level of: Supervision/Verbal cueing   Problem: RH Laundry Goal: LTG Patient will perform laundry w/assist, cues (OT) Description: LTG: Patient will perform laundry with assistance, with/without cues (OT). Flowsheets (Taken 07/14/2024 1244) LTG: Pt will perform laundry with assistance level of: Contact Guard/Touching assist   Problem: RH Toilet Transfers Goal: LTG Patient will perform toilet transfers w/assist (OT) Description: LTG: Patient will perform toilet transfers with assist, with/without cues using equipment (OT) Flowsheets (Taken 07/14/2024 1244) LTG: Pt will perform toilet transfers with assistance level of: Supervision/Verbal cueing   Problem: RH Tub/Shower Transfers Goal: LTG Patient will perform tub/shower transfers w/assist (OT) Description: LTG: Patient will perform tub/shower transfers with assist, with/without cues using equipment (OT) Flowsheets (Taken 07/14/2024 1244) LTG: Pt will perform tub/shower stall transfers with assistance level of: Contact Guard/Touching assist

## 2024-07-15 DIAGNOSIS — F331 Major depressive disorder, recurrent, moderate: Secondary | ICD-10-CM

## 2024-07-15 DIAGNOSIS — F4001 Agoraphobia with panic disorder: Secondary | ICD-10-CM

## 2024-07-15 DIAGNOSIS — F332 Major depressive disorder, recurrent severe without psychotic features: Secondary | ICD-10-CM | POA: Insufficient documentation

## 2024-07-15 MED ORDER — MAGNESIUM HYDROXIDE 400 MG/5ML PO SUSP
30.0000 mL | Freq: Once | ORAL | Status: AC
  Administered 2024-07-15: 30 mL via ORAL
  Filled 2024-07-15 (×2): qty 30

## 2024-07-15 NOTE — Progress Notes (Signed)
 Occupational Therapy Session Note  Patient Details  Name: Cassidy Bruce MRN: [id number] Date of Birth: 22-Feb-1996  Today's Date: 07/15/2024 OT Individual Time: 8699-8650 OT Individual Time Calculation (min): 49 min  and Today's Date: 07/15/2024 OT Missed Time: 11 Minutes Missed Time Reason: Other (comment) (delay in care)   Short Term Goals: Week 1:  OT Short Term Goal 1 (Week 1): Pt will complete U/LB bathing with CGA OT Short Term Goal 2 (Week 1): Pt will maintain L UE in safe position during bed mobility with MIN verbal cues OT Short Term Goal 3 (Week 1): Pt will recall hemi dressing techniques with MIN verbal cues OT Short Term Goal 4 (Week 1): Pt will complete shower transfer with CGA using LRAD  Skilled Therapeutic Interventions/Progress Updates:     Pt received semi-reclined in bed with aunt present in room upon OT arrival. Pt presenting with flat affect, receptive to skilled OT session reporting 0/10 pain- OT offering intermittent rest breaks, repositioning, and therapeutic support to optimize participation in therapy session. Pt's Aunt observing therapy session to provide motivation and learn more about how to support Pt throughout her recovery process. Pt motivated to walk to therapy gym this session. She was able to complete ~50 ft with MOD A provided for balance and motor control +verbal cues for foot placement, step length/width, and trunk position. Seated rest break provided and Pt was then able to complete additional 25 ft to therapy gym with same level of assist. Pt with ataxia present in trunk and B LEs. Pt transitioned to quadruped position with MIN A +verbal cues for technique. Engaged Pt in completing series of exercises in 4-point position to facilitate NMR to L U/LEs and facilitate WB'ing for improved muscle activation. Pt completed alternating bird dogs, cross body reaches moving 4.4# weighted ball, and alternating push/pulls with 4# weighted ball. Pt able to maintain L UE  engagement in WB'ing, increased challenges noted reaching cross midline with L UE d/t proprioceptive deficits. Pt then completed blocked practice of sit<>stands while holding 4.4# weighted ball at chest progressing to sit > stand > forwards chest press > sit with activity folcused on slow, controlled motor movements. Pt presenting with posterior lean- able to correct balance and weight shift forwards with MIN A. Donned 4# ankle weights onto B LEs and positioned large vertical mirror anterior to Pt for increased visual feedback. Engaged Pt in completing alternating marches with task focused on awareness of foot positioning, lateral weight shifting, and glute engagement- completed 3x6 reps with MIN HHA x2. She completed functional mobility back to her room ~50 ft at same level as previous and was transported remainder of the way in wc d/t fatigue. Stand pivot wc > EOB MOD HHA. Pt was left resting in bed with call bell in reach, bed alarm on, and all needs met.    Therapy Documentation Precautions:  Precautions Precautions: Fall Recall of Precautions/Restrictions: Intact Restrictions Weight Bearing Restrictions Per Provider Order: No   Therapy/Group: Individual Therapy  Katheryn SHAUNNA [place]ines 07/15/2024, 2:30 PM
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
## 2024-07-15 NOTE — Progress Notes (Signed)
 Patient ID: Cassidy Bruce, female   DOB: 1995/12/15, 28 y.o.   MRN: [id number] Met with the patient to review current medical situation, rehab process, team conference and plan of care. Patient was very drowsy; reported she did not sleep well last pm. 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 Attempted to review medications for CVA management and heart healthy dietary modifications.Given information on the need for follow up with neurology and GYN as she has no PCP. Given information for establishing a PCP. Patient was dismissive but stated an understanding of the information. Continue to follow along to address educational needs to facilitate preparation for discharge. Cassidy Bruce

## 2024-07-15 NOTE — Progress Notes (Signed)
 Physical Therapy Session Note  Patient Details  Name: Cassidy Bruce MRN: [id number] Date of Birth: [birth year]  Today's Date: 07/15/2024 PT Individual Time: 8594-8483 PT Individual Time Calculation (min): 71 min   Short Term Goals: Week 1:  PT Short Term Goal 1 (Week 1): Pt will perform sit to stand with CGA consistently. PT Short Term Goal 2 (Week 1): Pt will perform bed to chair with CGA consistently. PT Short Term Goal 3 (Week 1): Pt will ambulate x150' with CGA and LRAD. PT Short Term Goal 4 (Week 1): Pt will complete balance assessment.  Skilled Therapeutic Interventions/Progress Updates:  Prior to session, OT and RN relate that pt has been emotionally labile throughout morning and suggested PT therapy session outdoors. Patient supine in bed on entrance to room. Patient alert and agreeable to PT session. Pt's aunt in room.   Patient with no pain complaint at start of session.  Therapeutic Activity: Bed Mobility: Pt performed supine > sit with SBA. VC/ tc required for squaring to EOB and upright seated position. Transfers: Pt performed sit<>stand and stand pivot transfers throughout session with MinA  d/t posterior bias upon standing and inability to self-correct. Provided vc/ tc for sequencing forward scoot, posterior feet placement, forward hip hinge. Continued posterior bias requiring Bil knee extensor push into seat.   Pt taken outdoors dependently via w/c with aunt for improved mood.   Gait Training/ NMR:  Pt ambulated on uneven patio surface without RW over 100' x3 with MinA overall and up to [facility] for imbalance d/t trunkal ataxia. Guided pt in ambulation through alternating cones requiring toe taps and then circling around cones. Is more difficult to perform steady stance on LLE and guided with cues with facilitation and improving to cues>facilitation to maintain balance throughout. For NMR for balance, pt guided in kicking activity while standing with aunt rolling ball to pt and  this therapist providing up to MinA to maintain balance. Initially requires vc/ tc for balance with coordination of kick and improving in timing of LLE kick throughout. Kicks with RLE good with LLE fairly stable in stance.   NMR performed for improvements in motor control and coordination, balance, sequencing, judgement, and self confidence/ efficacy in performing all aspects of mobility at highest level of independence.   Pt was emotionally labile during session while asking, why did this have to happen to me? Pt provided with therapeutic, empathetic listening and positive but realistic encouragement along with stories of members from local stroke support group.   Patient supine in bed at end of session with brakes locked, bed alarm set, and all needs within reach.   Therapy Documentation Precautions:  Precautions Precautions: Fall Recall of Precautions/Restrictions: Intact Restrictions Weight Bearing Restrictions Per Provider Order: No   Pain: No pain related this session.   Therapy/Group: Individual Therapy  Mliss DELENA Milliner PT, DPT, CSRS 07/15/2024, 6:44 PM

## 2024-07-15 NOTE — Plan of Care (Signed)
  Problem: Consults Goal: RH STROKE PATIENT EDUCATION Description: See Patient Education module for education specifics  Outcome: Progressing   Problem: RH BOWEL ELIMINATION Goal: RH STG MANAGE BOWEL WITH ASSISTANCE Description: STG Manage Bowel with supervision Assistance. Outcome: Progressing   Problem: RH BLADDER ELIMINATION Goal: RH STG MANAGE BLADDER WITH ASSISTANCE Description: STG Manage Bladder With supervision Assistance Outcome: Progressing   Problem: RH SKIN INTEGRITY Goal: RH STG SKIN FREE OF INFECTION/BREAKDOWN Description: Manage skin free of infection with supervision assistance Outcome: Progressing   Problem: RH SAFETY Goal: RH STG ADHERE TO SAFETY PRECAUTIONS W/ASSISTANCE/DEVICE Description: STG Adhere to Safety Precautions With Assistance/Device. Outcome: Progressing   Problem: RH PAIN MANAGEMENT Goal: RH STG PAIN MANAGED AT OR BELOW PT'S PAIN GOAL Description: <4 w/ prns Outcome: Progressing   Problem: RH KNOWLEDGE DEFICIT Goal: RH STG INCREASE KNOWLEGDE OF HYPERLIPIDEMIA Description: Manage increase knowledge of hyperlipidemia with supervision assistance from friend/ aunt using educational materials provided Outcome: Progressing Goal: RH STG INCREASE KNOWLEDGE OF STROKE PROPHYLAXIS Description: Manage increase knowledge of stroke prophylaxis with supervision assistance from friend/ aunt using educational materials provided Outcome: Progressing

## 2024-07-15 NOTE — Progress Notes (Signed)
 In room having a tearful episode. Expressed wanting to go home and endorsing frustration. Discussed overall goals and obstacles have accomplished while in the hospital. Talked about achievements with her ambulation. Attempted to have patient identify future goals. Only goal able to endorse is being able to walk again. Does not discuss where will go after hospitalization. Delay in response about positive support and support will have outside of the hospital. Reached out to Therapy team to see if possible to have patient go off unit and outside for fresh air to aide in improving her mood. Continues to refuse to use the bathroom at this time. No pain at this time. Watching TV on her tablet.

## 2024-07-15 NOTE — Progress Notes (Addendum)
 PROGRESS NOTE   Subjective/Complaints: Laying in bed watching her tablet.  No new concerns elicited this morning.  ROS: Patient denies fever, new vision changes, dizziness, nausea, vomiting, diarrhea,  shortness of breath or chest pain, headache, or mood change.     Objective:   No results found. Recent Labs    07/13/24 0435 07/14/24 0503  WBC 5.9 5.8  HGB 12.3 13.1  HCT 36.2 39.1  PLT 116* 131*   Recent Labs    07/14/24 0503  NA 141  K 3.5  CL 106  CO2 28  GLUCOSE 84  BUN 8  CREATININE 0.82  CALCIUM  8.8*    Intake/Output Summary (Last 24 hours) at 07/15/2024 1727 Last data filed at 07/15/2024 1415 Gross per 24 hour  Intake 476 ml  Output --  Net 476 ml        Physical Exam: Vital Signs Blood pressure 111/81, pulse 66, temperature 98.8 F (37.1 C), temperature source Oral, resp. rate 18, height 5' 4 (1.626 m), weight 65.3 kg, SpO2 98%.  General: No apparent distress HEENT: Head is normocephalic, atraumatic, sclera anicteric, oral mucosa pink and moist, Heart: Reg rate and rhythm.  Chest: CTA bilaterally without wheezes, rales, or rhonchi; no distress Abdomen: Soft, non-tender, non-distended, bowel sounds positive. Extremities: No clubbing, cyanosis, or edema Psych: Pt's affect is appropriate. Pt is cooperative Skin: Clean and intact without signs of breakdown Neuro:  Patient is alert.  Sitting up in bed.  Makes eye contact with examiner.  Follows simple commands.  Differentiates left from right.  Hesitant speech with pseudo bulbar speech dysarthria.  Right upper extremity and right lower extremity 5 out of 5 strength, left upper extremity 4 out of 5 proximal and distal, left lower extremity 4+ out of 5, sensation is intact in all 4 extremities    Assessment/Plan: 1. Functional deficits which require 3+ hours per day of interdisciplinary therapy in a comprehensive inpatient rehab  setting. Physiatrist is providing close team supervision and 24 hour management of active medical problems listed below. Physiatrist and rehab team continue to assess barriers to discharge/monitor patient progress toward functional and medical goals  Care Tool:  Bathing    Body parts bathed by patient: Right arm, Left arm, Chest, Abdomen, Front perineal area, Right upper leg, Left upper leg, Right lower leg, Left lower leg, Face, Buttocks   Body parts bathed by helper: Buttocks, Right arm     Bathing assist Assist Level: Minimal Assistance - Patient > 75%     Upper Body Dressing/Undressing Upper body dressing   What is the patient wearing?: Pull over shirt    Upper body assist Assist Level: Minimal Assistance - Patient > 75%    Lower Body Dressing/Undressing Lower body dressing      What is the patient wearing?: Pants, Underwear/pull up     Lower body assist Assist for lower body dressing: Moderate Assistance - Patient 50 - 74%     Toileting Toileting    Toileting assist Assist for toileting: Minimal Assistance - Patient > 75%     Transfers Chair/bed transfer  Transfers assist     Chair/bed transfer assist level: Moderate Assistance - Patient 50 -  74%     Locomotion Ambulation   Ambulation assist      Assist level: Moderate Assistance - Patient 50 - 74% Assistive device: No Device Max distance: 150'   Walk 10 feet activity   Assist     Assist level: Moderate Assistance - Patient - 50 - 74% Assistive device: No Device   Walk 50 feet activity   Assist    Assist level: Moderate Assistance - Patient - 50 - 74% Assistive device: No Device    Walk 150 feet activity   Assist    Assist level: Moderate Assistance - Patient - 50 - 74% Assistive device: No Device    Walk 10 feet on uneven surface  activity   Assist     Assist level: Moderate Assistance - Patient - 50 - 74%     Wheelchair     Assist Is the patient using a  wheelchair?: Yes Type of Wheelchair: Manual    Wheelchair assist level: Dependent - Patient 0% Max wheelchair distance: 150'    Wheelchair 50 feet with 2 turns activity    Assist        Assist Level: Dependent - Patient 0%   Wheelchair 150 feet activity     Assist      Assist Level: Dependent - Patient 0%   Blood pressure 111/81, pulse 66, temperature 98.8 F (37.1 C), temperature source Oral, resp. rate 18, height 5' 4 (1.626 m), weight 65.3 kg, SpO2 98%.  Medical Problem List and Plan: 1. 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  Plan repeat CTA 2-3 months to evaluate for improvement dissection             -patient may shower             -ELOS/Goals: 10-12 days MinA             -Continue CIR   2.  Antithrombotics: -DVT/anticoagulation:  Pharmaceutical: Heparin  x 48 hours 07/12/19/2025             -antiplatelet therapy: continue Aspirin  81 mg daily and Plavix  75 mg daily x 3 months then aspirin  alone   3. Pain Management: continue Oxycodone /Fioricet  as needed   4. Mood/Behavior/Sleep/MDD/ADHD: continue Celexa  20 mg daily.  Provide emotional support.  Psychiatry consulted             -antipsychotic agents: N/A  - Patient was seen by psychology 9/11, continue current regimen and call back if further assistance needed.  Psychiatry is signed off   5. Neuropsych/cognition: This patient is not capable of making decisions on her own behalf.  - Patient seen by neuropsychology for depression, appreciate insight   6. Skin/Wound Care: Routine skin checks   7. Fluids/Electrolytes/Nutrition: Routine ins and outs with follow-up chemistries   8.  PFO.  TEE showed positive PFO, at rest LTR shunting and with Valsalva RTL shunting.  Follow-up outpatient cardiology service for question PFO closure   9.  Hyperlipidemia: continue Crestor  20 mg  daily   10.  Microcytosis/Thrombocytopenia low normal ferritin.  Placed on iron supplement  - 9/11 hemoglobin stable at 13.1, MCV 77.  Appears chronic.  Thalassemia workup?  Recheck Monday ordered  11.  Transaminitis.  Improved, ALT mildly elevated at 47.  Continue to monitor  Recheck Monday ordered  12. Constipation  -9/12 order milk of mg  LOS: 2 days A FACE TO FACE EVALUATION WAS PERFORMED  Murray Collier 07/15/2024, 5:27 PM

## 2024-07-15 NOTE — Progress Notes (Signed)
 Speech Language Pathology Daily Session Note  Patient Details  Name: Cassidy Bruce MRN: [id number] Date of Birth: 11-06-1995  Today's Date: 07/15/2024 SLP Individual Time: 9250-9155 SLP Individual Time Calculation (min): 55 min  Short Term Goals: Week 1: SLP Short Term Goal 1 (Week 1): Patient will recall and utilize memory strategies with min multimodal A SLP Short Term Goal 2 (Week 1): Patient will recall cognitive deficits with min multimodal A SLP Short Term Goal 3 (Week 1): Patient will demonstrate mildly complex problem solving skills with min multimodal A SLP Short Term Goal 4 (Week 1): Patient will consume least restrictive diet with use of safe swallowing strategies given supervision verbal A SLP Short Term Goal 5 (Week 1): Patient will utilize dysarthria strategies to increase fluency given min multimodal A  Skilled Therapeutic Interventions:   Pt seen for skilled SLP intervention to target cognitive and speech goals. Pt's speech is halting and effortful with monotone voice. SLP utilized unfamiliar and familiar pictures (from pt's phone) to target speech production and attempt to improve speech inflections and prosody. SLP encouraged pt to talk to loved family and friends on the phone when feeling up to it to functionally work on conversational speech. Cognitive exercises targeted attention, working memory, and sequencing with unfamiliar card game. Pt demonstrated good recall of rules and goal of game with min cues needed throughout game after initial explanation. Pt left sitting upright in bed with all needs in reach and call bell in reach + bed alarm set. Continue SLP PoC.   Pain Pain Assessment Pain Scale: 0-10 Pain Score: 0-No pain  Therapy/Group: Individual Therapy  Peyton JINNY Rummer 07/15/2024, 8:49 AM

## 2024-07-15 NOTE — Care Management (Signed)
 Inpatient Rehabilitation Center Individual Statement of Services  Patient Name:  Cassidy Bruce  Date:  07/15/2024  Welcome to the Inpatient Rehabilitation Center.  Our goal is to provide you with an individualized program based on your diagnosis and situation, designed to meet your specific needs.  With this comprehensive rehabilitation program, you will be expected to participate in at least 3 hours of rehabilitation therapies Monday-Friday, with modified therapy programming on the weekends.  Your rehabilitation program will include the following services:  Physical Therapy (PT), Occupational Therapy (OT), Speech Therapy (ST), 24 hour per day rehabilitation nursing, Therapeutic Recreaction (TR), Psychology, Neuropsychology, Care Coordinator, Rehabilitation Medicine, Nutrition Services, Pharmacy Services, and Other  Weekly team conferences will be held on Wednesday to discuss your progress.  Your Inpatient Rehabilitation Care Coordinator will talk with you frequently to get your input and to update you on team discussions.  Team conferences with you and your family in attendance may also be held.  Expected length of stay: 14-17 days    Overall anticipated outcome: Supervision  Depending on your progress and recovery, your program may change. Your Inpatient Rehabilitation Care Coordinator will coordinate services and will keep you informed of any changes. Your Inpatient Rehabilitation Care Coordinator's name and contact numbers are listed  below.  The following services may also be recommended but are not provided by the Inpatient Rehabilitation Center:  Driving Evaluations Home Health Rehabiltiation Services Outpatient Rehabilitation Services Vocational Rehabilitation   Arrangements will be made to provide these services after discharge if needed.  Arrangements include referral to agencies that provide these services.  Your insurance has been verified to be:  Uninsured  Your primary  doctor is:  No PCP listed  Pertinent information will be shared with your doctor and your insurance company.  Inpatient Rehabilitation Care Coordinator:  Rhoda Clement, KEN [phone] or ([phone]  Information discussed with and copy given to patient by: Graeme DELENA Jude, 07/15/2024, 9:20 AM

## 2024-07-16 DIAGNOSIS — K5901 Slow transit constipation: Secondary | ICD-10-CM

## 2024-07-16 MED ORDER — PROCHLORPERAZINE MALEATE 5 MG PO TABS: 5.0000 mg | ORAL_TABLET | Freq: Four times a day (QID) | ORAL | Status: DC | PRN

## 2024-07-16 MED ORDER — MELATONIN 5 MG PO TABS: 5.0000 mg | ORAL_TABLET | Freq: Every evening | ORAL | Status: DC | PRN

## 2024-07-16 MED ORDER — DOCUSATE SODIUM 100 MG PO CAPS
200.0000 mg | ORAL_CAPSULE | Freq: Every day | ORAL | Status: DC
  Administered 2024-07-16 – 2024-07-25 (×10): 200 mg via ORAL
  Filled 2024-07-16 (×10): qty 2

## 2024-07-16 MED ORDER — ALUM & MAG HYDROXIDE-SIMETH 200-200-20 MG/5ML PO SUSP: 30.0000 mL | ORAL | Status: DC | PRN

## 2024-07-16 MED ORDER — GUAIFENESIN-DM 100-10 MG/5ML PO SYRP: 10.0000 mL | ORAL_SOLUTION | Freq: Four times a day (QID) | ORAL | Status: DC | PRN

## 2024-07-16 NOTE — Progress Notes (Signed)
 Physical Therapy Session Note  Patient Details  Name: Cassidy Bruce MRN: [id number] Date of Birth: 05/31/1996  Today's Date: 07/16/2024 PT Individual Time: 9061-8982 PT Individual Time Calculation (min): 39 min  And  Today's Date: 07/16/2024 PT Missed Time: 21 Minutes Missed Time Reason: Patient fatigue   Short Term Goals: Week 1:  PT Short Term Goal 1 (Week 1): Pt will perform sit to stand with CGA consistently. PT Short Term Goal 2 (Week 1): Pt will perform bed to chair with CGA consistently. PT Short Term Goal 3 (Week 1): Pt will ambulate x150' with CGA and LRAD. PT Short Term Goal 4 (Week 1): Pt will complete balance assessment.  Skilled Therapeutic Interventions/Progress Updates:  Patient supine in bed and asleep on entrance to room. Pt is slow to fully wake and requests time. On return to room, patient alert and agreeable to PT session.   Patient with no pain complaint at start of session.  Therapeutic Activity: Bed Mobility: Pt performed supine > sit with supervision. No cueing required for technique/ safety. Transfers: Pt performed sit<>stand and stand pivot transfers throughout session with CGA/ MinA d/t posterior bias upon standing and quick return to sitting in slide to R side. Discards RW during pivot.  Provided vc/ tc for improved technique with NMR training.  Gait Training:  Pt ambulated 110' x2 using RW and close supervision/ CGA. Demos slight improvement in balance and quality of gait d/t BUE stabilizing trunk. Minimal cueing to maintain small to medium sized step lengths, progress heel-to-toe, and widen BOS. During session, gait training without AD and 5# aw to each LE. Pt continues to demo LE ataxia with instability at hips allowing for increased trunkal sway laterally. Requires Min/ ModA for balance throughout and vc for focus to foot placement and sequencing movements required to progress steps.   Neuromuscular Re-ed: NMR facilitated during session with focus on  standing balance, reducing errant trunkal movements. Pt guided in sit<>stand training with focus on setup and technique. Improves with significant focus called to ball and toes of feet and to press hard into floor upon standing. Significant improvement in attaining balance immediately on stance after blocked practice x10 with no need for extensor push of knees into mat table by end of bout. Focus also on sequencing forward scoot, posterior feet placement, forward hip hinge and press of forefoot into floor. NMR performed for improvements in motor control and coordination, balance, sequencing, judgement, and self confidence/ efficacy in performing all aspects of mobility at highest level of independence.   Patient supine in bed at end of session with brakes locked, bed alarm set, and all needs within reach.   Therapy Documentation Precautions:  Precautions Precautions: Fall Recall of Precautions/Restrictions: Intact Restrictions Weight Bearing Restrictions Per Provider Order: No  Pain: No pain related this session.   Therapy/Group: Individual Therapy  Mliss DELENA Milliner PT, DPT, CSRS 07/16/2024, 1:36 PM

## 2024-07-16 NOTE — Progress Notes (Signed)
 Throughout the day patient encouraged to void. Refusing to void when prompted. Female nurse tech and writer attempted to encourage her to void. Explain concern and risk for not voiding for more than 12 hours. Explained would have to obtain an order to do an in & out cath. Afterwards Nurse Tech went back in talked to patient able to void and use the bathroom.

## 2024-07-16 NOTE — Progress Notes (Signed)
 Encouraged patient importance of drinking water . Brought pitcher of ice and bottles of water  room. Briefly discussed favorite food items to encourage her to increase intake of food throughout the day. Follow up with favorite food items. No further issues or concerns at this time.

## 2024-07-16 NOTE — Progress Notes (Signed)
 PROGRESS NOTE   Subjective/Complaints:  Pt doing well, slept well. Denies pain. LBM 3 days ago per pt which she says is normal, but not documented in 5 days. Urinating fine. No other complaints or concerns.   ROS: as per HPI. Denies CP, SOB, abd pain, N/V/D, or any other complaints at this time.       Objective:   No results found. Recent Labs    07/14/24 0503  WBC 5.8  HGB 13.1  HCT 39.1  PLT 131*   Recent Labs    07/14/24 0503  NA 141  K 3.5  CL 106  CO2 28  GLUCOSE 84  BUN 8  CREATININE 0.82  CALCIUM  8.8*    Intake/Output Summary (Last 24 hours) at 07/16/2024 1050 Last data filed at 07/16/2024 0859 Gross per 24 hour  Intake 236 ml  Output --  Net 236 ml        Physical Exam: Vital Signs Blood pressure 94/65, pulse 66, temperature 98.5 F (36.9 C), resp. rate 18, height 5' 4 (1.626 m), weight 65.3 kg, SpO2 97%.  General: No apparent distress, resting in bed watching her tablet.  HEENT: Head is normocephalic, atraumatic, sclera anicteric, oral mucosa pink and moist, Heart: Reg rate and rhythm. No m/r/g Chest: CTA bilaterally without wheezes, rales, or rhonchi; no distress Abdomen: Soft, non-tender, non-distended, bowel sounds positive but hypoactive.  Extremities: No clubbing, cyanosis, or edema Psych: Pt's affect is appropriate. Pt is cooperative Skin: Clean and intact without signs of breakdown Neuro: dysarthria, follows simple commands.   PRIOR EXAMS: Neuro:  Patient is alert.  Sitting up in bed.  Makes eye contact with examiner.  Follows simple commands.  Differentiates left from right.  Hesitant speech with pseudo bulbar speech dysarthria.  Right upper extremity and right lower extremity 5 out of 5 strength, left upper extremity 4 out of 5 proximal and distal, left lower extremity 4+ out of 5, sensation is intact in all 4 extremities    Assessment/Plan: 1. Functional deficits which  require 3+ hours per day of interdisciplinary therapy in a comprehensive inpatient rehab setting. Physiatrist is providing close team supervision and 24 hour management of active medical problems listed below. Physiatrist and rehab team continue to assess barriers to discharge/monitor patient progress toward functional and medical goals  Care Tool:  Bathing    Body parts bathed by patient: Right arm, Left arm, Chest, Abdomen, Front perineal area, Right upper leg, Left upper leg, Right lower leg, Left lower leg, Face, Buttocks   Body parts bathed by helper: Buttocks, Right arm     Bathing assist Assist Level: Minimal Assistance - Patient > 75%     Upper Body Dressing/Undressing Upper body dressing   What is the patient wearing?: Pull over shirt    Upper body assist Assist Level: Minimal Assistance - Patient > 75%    Lower Body Dressing/Undressing Lower body dressing      What is the patient wearing?: Pants, Underwear/pull up     Lower body assist Assist for lower body dressing: Moderate Assistance - Patient 50 - 74%     Toileting Toileting    Toileting assist Assist for toileting: Minimal Assistance -  Patient > 75%     Transfers Chair/bed transfer  Transfers assist     Chair/bed transfer assist level: Moderate Assistance - Patient 50 - 74%     Locomotion Ambulation   Ambulation assist      Assist level: Moderate Assistance - Patient 50 - 74% Assistive device: No Device Max distance: 150'   Walk 10 feet activity   Assist     Assist level: Moderate Assistance - Patient - 50 - 74% Assistive device: No Device   Walk 50 feet activity   Assist    Assist level: Moderate Assistance - Patient - 50 - 74% Assistive device: No Device    Walk 150 feet activity   Assist    Assist level: Moderate Assistance - Patient - 50 - 74% Assistive device: No Device    Walk 10 feet on uneven surface  activity   Assist     Assist level: Moderate  Assistance - Patient - 50 - 74%     Wheelchair     Assist Is the patient using a wheelchair?: Yes Type of Wheelchair: Manual    Wheelchair assist level: Dependent - Patient 0% Max wheelchair distance: 150'    Wheelchair 50 feet with 2 turns activity    Assist        Assist Level: Dependent - Patient 0%   Wheelchair 150 feet activity     Assist      Assist Level: Dependent - Patient 0%   Blood pressure 94/65, pulse 66, temperature 98.5 F (36.9 C), resp. rate 18, height 5' 4 (1.626 m), weight 65.3 kg, SpO2 97%.  Medical Problem List and Plan: 1. 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  Plan repeat CTA 2-3 months to evaluate for improvement dissection             -patient may shower             -ELOS/Goals: 10-12 days MinA             -Continue CIR   2.  Antithrombotics: -DVT/anticoagulation:  Pharmaceutical: Heparin  x 48 hours 07/11/2024-- done, but weekday team to assess if Lovenox indicated now?  -antiplatelet therapy: continue Aspirin  81 mg daily and Plavix  75 mg daily x 3 months then aspirin  alone   3. Pain Management: continue Oxycodone /Fioricet  as needed, tylenol  PRN   4. Mood/Behavior/Sleep/MDD/ADHD: continue Celexa  20 mg daily.  Provide emotional support.  Psychiatry consulted             -antipsychotic agents: N/A - Patient was seen by psychology 9/11, continue current regimen and call back if further assistance needed.  Psychiatry is signed off -Melatonin PRN   5. Neuropsych/cognition: This patient is not capable of making decisions on her own behalf.  - Patient seen by neuropsychology for depression, appreciate insight   6. Skin/Wound Care: Routine skin checks   7. Fluids/Electrolytes/Nutrition: Routine ins and outs with follow-up chemistries   8.  PFO.  TEE showed positive PFO, at rest LTR shunting and with  Valsalva RTL shunting.  Follow-up outpatient cardiology service for question PFO closure   9.  Hyperlipidemia: continue Crestor  20 mg daily   10.  Microcytosis/Thrombocytopenia low normal ferritin.  Placed on iron supplement - 9/11 hemoglobin stable at 13.1, MCV 77.  Appears chronic.  Thalassemia workup?  Recheck CBC Monday ordered  11.  Transaminitis.  Improved, ALT mildly elevated at 47.  Continue to  monitor  Recheck Monday ordered  12. Constipation  -9/12 order milk of mg -07/16/24 no BM still, started colace 200mg  daily, also taking miralax  BID. If no BM by tomorrow, would try sorbitol .   LOS: 3 days A FACE TO FACE EVALUATION WAS PERFORMED  [address] 07/16/2024, 10:50 AM

## 2024-07-16 NOTE — Plan of Care (Signed)
  Problem: Consults Goal: RH STROKE PATIENT EDUCATION Description: See Patient Education module for education specifics  Outcome: Progressing   Problem: RH BLADDER ELIMINATION Goal: RH STG MANAGE BLADDER WITH ASSISTANCE Description: STG Manage Bladder With supervision Assistance Outcome: Progressing   Problem: RH SKIN INTEGRITY Goal: RH STG SKIN FREE OF INFECTION/BREAKDOWN Description: Manage skin free of infection with supervision assistance Outcome: Progressing   Problem: RH SAFETY Goal: RH STG ADHERE TO SAFETY PRECAUTIONS W/ASSISTANCE/DEVICE Description: STG Adhere to Safety Precautions With Assistance/Device. Outcome: Progressing   Problem: RH PAIN MANAGEMENT Goal: RH STG PAIN MANAGED AT OR BELOW PT'S PAIN GOAL Description: <4 w/ prns Outcome: Progressing   Problem: RH BOWEL ELIMINATION Goal: RH STG MANAGE BOWEL WITH ASSISTANCE Description: STG Manage Bowel with supervision Assistance. Outcome: Not Progressing

## 2024-07-16 NOTE — IPOC Note (Signed)
 Overall Plan of Care [facility]) Patient Details Name: Cassidy Bruce MRN: [id number] DOB: December 08, 1995  Admitting Diagnosis: Thromboembolic stroke [facility])  Hospital Problems: Principal Problem:   Thromboembolic stroke (HCC) Active Problems:   MDD (major depressive disorder), recurrent episode, moderate (HCC)   Severe episode of recurrent major depressive disorder, without psychotic features (HCC)   Panic disorder with agoraphobia and moderate panic attacks     Functional Problem List: Nursing Bladder, Bowel, Edema, Endurance, Medication Management, Safety  PT Balance, Endurance, Motor, Safety  OT Balance, Cognition, Endurance, Motor, Pain, Perception, Safety, Sensory, Skin Integrity, Vision, Behavior  SLP Cognition, Linguistic, Nutrition  TR         Basic ADL's: OT Grooming, Bathing, Dressing, Toileting     Advanced  ADL's: OT Laundry     Transfers: PT Bed Mobility, Bed to Chair, Car, Occupational psychologist, Research scientist (life sciences): PT Ambulation, Stairs     Additional Impairments: OT Fuctional Use of Upper Extremity  SLP Swallowing, Social Cognition   Problem Solving, Memory, Awareness  TR      Anticipated Outcomes Item Anticipated Outcome  Self Feeding Mod I  Swallowing  mod i   Basic self-care  SUP  Toileting  SUP   Bathroom Transfers SUP  Bowel/Bladder  Manage bowels with medications/manage bladder with toileting assistance  Transfers  Supervision  Locomotion  Supervision  Communication  supervision  Cognition  supervision  Pain  <4 w/ prn  Safety/Judgment  manage safety with supervision assistance   Therapy Plan: PT Intensity: Minimum of 1-2 x/day ,45 to 90 minutes PT Frequency: 5 out of 7 days PT Duration Estimated Length of Stay: 14-17 Days OT Intensity: Minimum of 1-2 x/day, 45 to 90 minutes OT Frequency: 5 out of 7 days OT Duration/Estimated Length of Stay: 14-17 days SLP Intensity: Minumum of 1-2 x/day, 30 to 90 minutes SLP Frequency: 3  to 5 out of 7 days SLP Duration/Estimated Length of Stay: 14-16 days   Team Interventions: Nursing Interventions Patient/Family Education, Bladder Management, Bowel Management, Disease Management/Prevention, Pain Management, Discharge Planning, Medication Management  PT interventions Ambulation/gait training, Community reintegration, DME/adaptive equipment instruction, Neuromuscular re-education, Psychosocial support, Stair training, UE/LE Strength taining/ROM, Warden/ranger, Discharge planning, Therapeutic Activities, UE/LE Coordination activities, Cognitive remediation/compensation, Functional mobility training, Patient/family education, Therapeutic Exercise  OT Interventions Warden/ranger, DME/adaptive equipment instruction, Patient/family education, Therapeutic Activities, Cognitive remediation/compensation, Functional electrical stimulation, Psychosocial support, Therapeutic Exercise, Community reintegration, Functional mobility training, Self Care/advanced ADL retraining, UE/LE Strength taining/ROM, Discharge planning, Neuromuscular re-education, Skin care/wound managment, UE/LE Coordination activities, Disease mangement/prevention, Pain management, Splinting/orthotics, Visual/perceptual remediation/compensation  SLP Interventions Cognitive remediation/compensation, Dysphagia/aspiration precaution training, Internal/external aids, Speech/Language facilitation, Cueing hierarchy, Therapeutic Activities, Functional tasks, Multimodal communication approach, Patient/family education  TR Interventions    SW/CM Interventions Discharge Planning, Psychosocial Support, Patient/Family Education   Barriers to Discharge MD  Medical stability  Nursing Decreased caregiver support, Home environment access/layout Discharge: Apartment  Discharge Home Layout: One level  Discharge Home Access: Stairs to enter  Entrance Stairs-Rails: Can reach both  Entrance Stairs-Number of Steps: 2  flights  PT Home environment access/layout, Decreased caregiver support    OT None    SLP      SW Home environment Best boy, Insurance for SNF coverage, Medication compliance     Team Discharge Planning: Destination: PT-Home ,OT- Home , SLP-Home Projected Follow-up: PT-24 hour supervision/assistance, Outpatient PT, OT-  Outpatient OT, SLP-Outpatient SLP Projected Equipment Needs: PT-To be determined, OT- To be determined, SLP-None recommended by SLP Equipment  Details: PT- , OT-  Patient/family involved in discharge planning: PT- Patient,  OT-Patient, SLP-Patient  MD ELOS: 14-16 Medical Rehab Prognosis:  Excellent Assessment: The patient has been admitted for CIR therapies with the diagnosis of bilateral cerebellar infarcts involving B/L SCA and right PICA territories likely thromboembolic from left VA occlusion possible dissection . The team will be addressing functional mobility, strength, stamina, balance, safety, adaptive techniques and equipment, self-care, bowel and bladder mgt, patient and caregiver education. Goals have been set at sup. Anticipated discharge destination is home.        See Team Conference Notes for weekly updates to the plan of care

## 2024-07-16 NOTE — Progress Notes (Signed)
 Speech Language Pathology Daily Session Note  Patient Details  Name: Cassidy Bruce MRN: [id number] Date of Birth: [birth year]  Today's Date: 07/16/2024 SLP Individual Time: 1100-1200 SLP Individual Time Calculation (min): 60 min  Short Term Goals: Week 1: SLP Short Term Goal 1 (Week 1): Patient will recall and utilize memory strategies with min multimodal A SLP Short Term Goal 2 (Week 1): Patient will recall cognitive deficits with min multimodal A SLP Short Term Goal 3 (Week 1): Patient will demonstrate mildly complex problem solving skills with min multimodal A SLP Short Term Goal 4 (Week 1): Patient will consume least restrictive diet with use of safe swallowing strategies given supervision verbal A SLP Short Term Goal 5 (Week 1): Patient will utilize dysarthria strategies to increase fluency given min multimodal A  Skilled Therapeutic Interventions:   Pt greeted at bedside. She was awake upon SLP arrival and very pleasant/cooperative throughout tx tasks targeting communication. SLP facilitated a variety of tx tasks targeting prosody and breath/speech coordination. Introduced breathing exercises x2 targeting controled inhalation/exhalation. She completed 3-3-3 breaths and sustained /s/ (for 5-7 seconds) x8. She required modA visual/verbal cues throughout to reduce involuntary exhalations before their desired times. She then completed verbal tasks counting 1-10: increasing volume, decreasing volume, and alternating volumes to target prosody and coordination. She benefited from maxA for breath support and volume control throughout given complexity of task. Also completed oral reading task w/ contrasting words in sentences. She benefited from modA to remain 90-95% intelligible. Overall, effortful speech w/ persistent dysprosody and imprecise articulation remain. SLP provided breath/speech coordination exercises via handout and encouraged her to continue to complete these outside of scheduled tx  sessions as well. At the end of tx tasks, she was left in her bed w/ the alarm set and call light within reach. Recommend cont ST per POC.   Pain  No pain reported  Therapy/Group: Individual Therapy  [name] 07/16/2024, 11:50 AM

## 2024-07-16 NOTE — Plan of Care (Signed)
  Problem: RH SAFETY Goal: RH STG ADHERE TO SAFETY PRECAUTIONS W/ASSISTANCE/DEVICE Description: STG Adhere to Safety Precautions With Assistance/Device. Outcome: Progressing   Problem: RH PAIN MANAGEMENT Goal: RH STG PAIN MANAGED AT OR BELOW PT'S PAIN GOAL Description: <4 w/ prns Outcome: Progressing   Problem: Consults Goal: RH STROKE PATIENT EDUCATION Description: See Patient Education module for education specifics  Outcome: Not Progressing

## 2024-07-16 NOTE — Progress Notes (Signed)
 Physical Therapy Session Note  Patient Details  Name: Cassidy Bruce MRN: [id number] Date of Birth: 1995-12-09  Today's Date: 07/16/2024 PT Individual Time: 8691-8645 PT Individual Time Calculation (min): 46 min   Short Term Goals: Week 1:  PT Short Term Goal 1 (Week 1): Pt will perform sit to stand with CGA consistently. PT Short Term Goal 2 (Week 1): Pt will perform bed to chair with CGA consistently. PT Short Term Goal 3 (Week 1): Pt will ambulate x150' with CGA and LRAD. PT Short Term Goal 4 (Week 1): Pt will complete balance assessment.  Skilled Therapeutic Interventions/Progress Updates: Patient asleep in bed on entrance to room. Patient alert and agreeable to PT session.   Patient reported no pain, only tiredness due to inability to sleep the previous night.  Therapeutic Activity: Bed Mobility: Pt performed supine<>sit on EOB with supervision and HOB slightly elevated Transfers: Pt performed sit<>stand transfers throughout session with CGA and VC for hand placement and increasing safety awareness when sitting to ensure hips are directly over seat, as well as to avoid reaching far out of BOS.  Gait Training:  Pt ambulated from room<>main gym using RW with CGA/light minA on occasion due to ataxic presentation. Pt demonstrated the following gait deviations with therapist providing the described cuing and facilitation for improvement:  - Decreased BOS with cues to widen step placement on L LE - Cues to decrease cadence and to take time with steps (step pattern improved on way back to room at end of session with maintaining neutral step width  Neuromuscular Re-ed: - Tossing bean bags to board with R UE (pt unable to extend digits in L UE to toss bag in quick succession). Pt performed without UE support and CGA that progressed to close supervision. Pt with slightly ataxic presentation while standing but able to maintain dynamic standing balance. Pt cued to slow down and control speed of  toss. 2 rounds performed with pt also picking up bags fallen on floor from table with CGA. - Pt standing by table and instructed to move cone with disc stacked on top from one end of table to the other with L UE. Pt cued to avoid placing hand on table to further increase challenge to coordination. Pt required increased time to extend digits (cued to take time to concentrate on required musculature in forearm to do so) and release cone without knocking it over. Pt performed mass practice with overall light CGA for safety - Pt sitting edge of mat with cues to step over cone on R/L with L LE. PTA pulling LE into abduction with cues for pt to control eccentric (red theraband use). Pt then cued to tap cone at top before stepping over to floor. Pt presented with slight ataxic presentation, but able to perform without missing cone tap  NMR performed for improvements in motor control and coordination, balance, sequencing, judgement, and self confidence/ efficacy in performing all aspects of mobility at highest level of independence.   Patient supine in bed at end of session with brakes locked, family present, bed alarm set, and all needs within reach.      Therapy Documentation Precautions:  Precautions Precautions: Fall Recall of Precautions/Restrictions: Intact Restrictions Weight Bearing Restrictions Per Provider Order: No  Therapy/Group: Individual Therapy  Wei Newbrough PTA 07/16/2024, 1:59 PM

## 2024-07-16 NOTE — Progress Notes (Signed)
 Occupational Therapy Session Note  Patient Details  Name: Cassidy Bruce MRN: [id number] Date of Birth: 1996-09-26  Today's Date: 07/16/2024 OT Individual Time: 1445-1530 OT Individual Time Calculation (min): 45 min    Short Term Goals: Week 1:  OT Short Term Goal 1 (Week 1): Pt will complete U/LB bathing with CGA OT Short Term Goal 2 (Week 1): Pt will maintain L UE in safe position during bed mobility with MIN verbal cues OT Short Term Goal 3 (Week 1): Pt will recall hemi dressing techniques with MIN verbal cues OT Short Term Goal 4 (Week 1): Pt will complete shower transfer with CGA using LRAD    Skilled Therapeutic Interventions/Progress Updates:    Pt received in bed with family present and agreeable to a shower.  Overall pt participated well at a CGA to S level for UB tasks and LB bathing and min A with LB dressing with cues to SLOW down, attend to L side, cues for forced use of L hand, cues for balance strategies.  Her trunk control has improved from admission and she was able to integrate L hand into functional tasks.   Pt completed shower and dressing (see ADL documentation below)  To work on L side coordination, pt given tasks for forced use (opening and closing zippered duffel bag) using L hand as a stabilizing assist, pulling pants up with L hand with place and hold from therapist,  3 lb dowel bar with keeping both sides of bar at the same level,  and simulated drinking with empty coffee mug.    Pt participated well - resting in bed with all needs met. Alarm set.   Therapy Documentation Precautions:  Precautions Precautions: Fall Recall of Precautions/Restrictions: Intact Restrictions Weight Bearing Restrictions Per Provider Order: No    Vital Signs: Therapy Vitals Temp: 98.4 F (36.9 C) Temp Source: Oral Pulse Rate: 75 Resp: 18 BP: 118/76 Patient Position (if appropriate): Lying Oxygen Therapy SpO2: 98 % O2 Device: Room Air Pain: Pain Assessment Pain Score:  0-No pain ADL: ADL Eating: Set up (per Pt report and chart review) Where Assessed-Eating: Bed level Grooming: Minimal assistance Where Assessed-Grooming: Edge of bed Upper Body Bathing: Supervision/safety Where Assessed-Upper Body Bathing: Shower (sitting on TTB) Lower Body Bathing: Supervision/safety Where Assessed-Lower Body Bathing: Shower (sitting on TTB) Upper Body Dressing: Supervision/safety Where Assessed-Upper Body Dressing: Edge of bed Lower Body Dressing: Minimal assistance Where Assessed-Lower Body Dressing: Edge of bed Toileting: Minimal assistance Where Assessed-Toileting: Teacher, adult education: Minimal assistance (HHA) Toilet Transfer Method: Ambulating, Stand pivot Toilet Transfer Equipment: Engineer, technical sales: Not assessed Film/video editor: Administrator, arts Method: Designer, industrial/product: Emergency planning/management officer    Therapy/Group: Individual Therapy  Colandra Ohanian 07/16/2024, 3:54 PM

## 2024-07-17 MED ORDER — SORBITOL 70 % SOLN
60.0000 mL | Freq: Once | Status: AC
  Administered 2024-07-17: 60 mL via ORAL
  Filled 2024-07-17: qty 60

## 2024-07-17 NOTE — Progress Notes (Signed)
 Reported by therapy patient having another tearful episode outside. Continues to perseverate on ongoing treatment issues and current loss. Frustration with her progression while in the hospital and processing why this happen to her. Offering positive reinforcement and encouraging her to recognize the achievements have made while in the hospital. Less withdrawn this afternoon wanting the door open. Offered food and asked if any needing a warm blanket, but denied. Politely refused wanting to speak to a Chaplain today. Continue to round on patient and update accordingly.

## 2024-07-17 NOTE — Progress Notes (Signed)
 Occupational Therapy Session Note  Patient Details  Name: Cassidy Bruce MRN: [id number] Date of Birth: [birth year]  Today's Date: 07/17/2024 OT Individual Time: 1430-1530 OT Individual Time Calculation (min): 60 min  and Today's Date: 07/17/2024 OT Missed Time: 15 Minutes Missed Time Reason: Other (comment) (Pt requesting to finish phone call prior to OT session')  Short Term Goals: Week 1:  OT Short Term Goal 1 (Week 1): Pt will complete U/LB bathing with CGA OT Short Term Goal 2 (Week 1): Pt will maintain L UE in safe position during bed mobility with MIN verbal cues OT Short Term Goal 3 (Week 1): Pt will recall hemi dressing techniques with MIN verbal cues OT Short Term Goal 4 (Week 1): Pt will complete shower transfer with CGA using LRAD  Skilled Therapeutic Interventions/Progress Updates:     Pt talking on phone upon OT arrival, requesting to finish phone call prior to session. Missed 15 minute skilled OT treatment d/t Pt on the phone. Will attempt to make up time as Pt's status and schedule allow. Returned to see Pt after 15 min with Pt received resting in bed presenting with flat affect, receptive to skilled OT session reporting 0/10 pain- OT offering intermittent rest breaks, repositioning, and therapeutic support to optimize participation in therapy session. Pt requesting to take shower this session. She completed ambulatory transfer to bathroom using RW with MIN A provided for balance- continues to present with trunkle ataxia and proprioceptive deficits in B LEs. Doffed clothing in seated position with SUP. She completed U/LB bathing on padded TTB with SBA. Facilitated forced use of L UE by having Pt utilize it to wash R hemibody and face- Pt with adequate grasp to maintain grasp on wash cloth, however slowed, jerky motor movements 2/2 to ataxia. Dried self in seated/standing position with CGA provided for balance. She ambulated back to EOB using RW with MIN A for balance +verbal cues for  pacing using RW. Applied lotion sitting EOB with SUP utilizing L UE at a diminished level with MOD verbal cues. Donned [place] shirt using hemi-techniques with SUP +increased time and pants CGA provided when brining pants to waist. Remainder of therapy session completed outside to support moral and participation. Transported Pt to/from outdoor location total A in wc for energy conservation and time management. Engaged Pt in completing three bouts of functional mobility training no AD to work on self confidence, body awareness, and LB motor control. She required MIN-MOD A overall with assistance provided to correct LOB frequently during functional mobility- slowed stepping strategies and decreased reactionary balance 2/2 ataxia and proprioceptive deficits. Pt became emotional, crying and stating why did this have to happen to me with MAX therapeutic support and encouragement provided. Pt was consolable and was able to calm down with increased time. Gentle education provided on peer support group and peer mentorship program available through hospital, however Pt declining at this time. Pt requesting to return to bed at end of session. Stand pivot wc > EOB MIN HHA. EOB > supine SUP. Pt was left resting in bed with call bell in reach, bed alarm on, and all needs met.    Therapy Documentation Precautions:  Precautions Precautions: Fall Recall of Precautions/Restrictions: Intact Restrictions Weight Bearing Restrictions Per Provider Order: No  Therapy/Group: Individual Therapy  Cassidy Bruce 07/17/2024, 8:03 AM

## 2024-07-17 NOTE — Progress Notes (Signed)
 PROGRESS NOTE   Subjective/Complaints:  Pt doing well again, slept well. Denies pain. LBM the other day per pt, but not documented in 6 days so pt agreeable with sorbitol . Urinating fine. No other complaints or concerns.   ROS: as per HPI. Denies CP, SOB, abd pain, N/V/D, or any other complaints at this time.       Objective:   No results found. No results for input(s): WBC, HGB, HCT, PLT in the last 72 hours.  No results for input(s): NA, K, CL, CO2, GLUCOSE, BUN, CREATININE, CALCIUM  in the last 72 hours.   Intake/Output Summary (Last 24 hours) at 07/17/2024 1036 Last data filed at 07/17/2024 0928 Gross per 24 hour  Intake 354 ml  Output --  Net 354 ml        Physical Exam: Vital Signs Blood pressure 114/67, pulse (!) 58, temperature 98 F (36.7 C), resp. rate 16, height 5' 4 (1.626 m), weight 65.3 kg, SpO2 98%.  General: No apparent distress, resting in bed watching her tablet.  HEENT: Head is normocephalic, atraumatic, sclera anicteric, oral mucosa pink and moist, Heart: Reg rate and rhythm. No m/r/g Chest: CTA bilaterally without wheezes, rales, or rhonchi; no distress Abdomen: Soft, non-tender, non-distended, bowel sounds positive but hypoactive.  Extremities: No clubbing, cyanosis, or edema Psych: Pt's affect is flat. Pt is cooperative Skin: Clean and intact without signs of breakdown Neuro: dysarthria, follows simple commands.   PRIOR EXAMS: Neuro:  Patient is alert.  Sitting up in bed.  Makes eye contact with examiner.  Follows simple commands.  Differentiates left from right.  Hesitant speech with pseudo bulbar speech dysarthria.  Right upper extremity and right lower extremity 5 out of 5 strength, left upper extremity 4 out of 5 proximal and distal, left lower extremity 4+ out of 5, sensation is intact in all 4 extremities    Assessment/Plan: 1. Functional deficits which  require 3+ hours per day of interdisciplinary therapy in a comprehensive inpatient rehab setting. Physiatrist is providing close team supervision and 24 hour management of active medical problems listed below. Physiatrist and rehab team continue to assess barriers to discharge/monitor patient progress toward functional and medical goals  Care Tool:  Bathing    Body parts bathed by patient: Right arm, Left arm, Chest, Abdomen, Front perineal area, Right upper leg, Left upper leg, Right lower leg, Left lower leg, Face, Buttocks   Body parts bathed by helper: Buttocks, Right arm     Bathing assist Assist Level: Supervision/Verbal cueing     Upper Body Dressing/Undressing Upper body dressing   What is the patient wearing?: Pull over shirt    Upper body assist Assist Level: Supervision/Verbal cueing    Lower Body Dressing/Undressing Lower body dressing      What is the patient wearing?: Pants, Underwear/pull up     Lower body assist Assist for lower body dressing: Minimal Assistance - Patient > 75%     Toileting Toileting    Toileting assist Assist for toileting: Minimal Assistance - Patient > 75%     Transfers Chair/bed transfer  Transfers assist     Chair/bed transfer assist level: Moderate Assistance - Patient 50 - 74%  Locomotion Ambulation   Ambulation assist      Assist level: Moderate Assistance - Patient 50 - 74% Assistive device: No Device Max distance: 150'   Walk 10 feet activity   Assist     Assist level: Moderate Assistance - Patient - 50 - 74% Assistive device: No Device   Walk 50 feet activity   Assist    Assist level: Moderate Assistance - Patient - 50 - 74% Assistive device: No Device    Walk 150 feet activity   Assist    Assist level: Moderate Assistance - Patient - 50 - 74% Assistive device: No Device    Walk 10 feet on uneven surface  activity   Assist     Assist level: Moderate Assistance - Patient - 50 -  74%     Wheelchair     Assist Is the patient using a wheelchair?: Yes Type of Wheelchair: Manual    Wheelchair assist level: Dependent - Patient 0% Max wheelchair distance: 150'    Wheelchair 50 feet with 2 turns activity    Assist        Assist Level: Dependent - Patient 0%   Wheelchair 150 feet activity     Assist      Assist Level: Dependent - Patient 0%   Blood pressure 114/67, pulse (!) 58, temperature 98 F (36.7 C), resp. rate 16, height 5' 4 (1.626 m), weight 65.3 kg, SpO2 98%.  Medical Problem List and Plan: 1. 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  Plan repeat CTA 2-3 months to evaluate for improvement dissection             -patient may shower             -ELOS/Goals: 10-12 days MinA             -Continue CIR   2.  Antithrombotics: -DVT/anticoagulation:  Pharmaceutical: Heparin  x 48 hours 07/11/2024-- done, but weekday team to assess if Lovenox indicated now vs doing SCDs?  -antiplatelet therapy: continue Aspirin  81 mg daily and Plavix  75 mg daily x 3 months then aspirin  alone   3. Pain Management: continue Oxycodone /Fioricet  as needed, tylenol  PRN   4. Mood/Behavior/Sleep/MDD/ADHD: continue Celexa  20 mg daily.  Provide emotional support.  Psychiatry consulted             -antipsychotic agents: N/A - Patient was seen by psychology 9/11, continue current regimen and call back if further assistance needed.  Psychiatry is signed off -Melatonin PRN   5. Neuropsych/cognition: This patient is not capable of making decisions on her own behalf.  - Patient seen by neuropsychology for depression, appreciate insight   6. Skin/Wound Care: Routine skin checks   7. Fluids/Electrolytes/Nutrition: Routine ins and outs with follow-up chemistries   8.  PFO.  TEE showed positive PFO, at rest LTR shunting and with Valsalva RTL  shunting.  Follow-up outpatient cardiology service for question PFO closure   9.  Hyperlipidemia: continue Crestor  20 mg daily   10.  Microcytosis/Thrombocytopenia low normal ferritin.  Placed on iron supplement - 9/11 hemoglobin stable at 13.1, MCV 77.  Appears chronic.  Thalassemia workup?  Recheck CBC Monday ordered  11.  Transaminitis.  Improved, ALT mildly elevated at 47.  Continue to monitor  Recheck Monday ordered  12. Constipation  -9/12 order milk of mg 30ml -07/16/24 no BM still, started colace 200mg  daily, also taking miralax  BID. If  no BM by tomorrow, would try sorbitol .  -07/17/24 no BM still, sorbitol  60ml ordered  LOS: 4 days A FACE TO FACE EVALUATION WAS PERFORMED  [address] 07/17/2024, 10:36 AM

## 2024-07-17 NOTE — Progress Notes (Signed)
   07/17/24 1900  Spiritual Encounters  Type of Visit Declined chaplain visit  Interventions  Spiritual Care Interventions Made Compassionate presence;Reflective listening  Intervention Outcomes  Outcomes Awareness of support  Spiritual Care Plan  Spiritual Care Issues Still Outstanding No further spiritual care needs at this time (see row info)   Chaplain spiritual support services remain available as the need arises.

## 2024-07-17 NOTE — Progress Notes (Signed)
 Not eating breakfast this morning. Open to taking meal supplement shake. Explain new medication to patient ordered by the Provider. Is in a low mood today observing episode of tearfulness today.

## 2024-07-17 NOTE — Plan of Care (Signed)
  Problem: Consults Goal: RH STROKE PATIENT EDUCATION Description: See Patient Education module for education specifics  Outcome: Progressing   Problem: RH BOWEL ELIMINATION Goal: RH STG MANAGE BOWEL WITH ASSISTANCE Description: STG Manage Bowel with supervision Assistance. Outcome: Progressing   Problem: RH BLADDER ELIMINATION Goal: RH STG MANAGE BLADDER WITH ASSISTANCE Description: STG Manage Bladder With supervision Assistance Outcome: Progressing   Problem: RH SKIN INTEGRITY Goal: RH STG SKIN FREE OF INFECTION/BREAKDOWN Description: Manage skin free of infection with supervision assistance Outcome: Progressing   Problem: RH SAFETY Goal: RH STG ADHERE TO SAFETY PRECAUTIONS W/ASSISTANCE/DEVICE Description: STG Adhere to Safety Precautions With Assistance/Device. Outcome: Progressing

## 2024-07-17 NOTE — Plan of Care (Signed)
  Problem: RH SAFETY Goal: RH STG ADHERE TO SAFETY PRECAUTIONS W/ASSISTANCE/DEVICE Description: STG Adhere to Safety Precautions With Assistance/Device. Outcome: Progressing   Problem: RH PAIN MANAGEMENT Goal: RH STG PAIN MANAGED AT OR BELOW PT'S PAIN GOAL Description: <4 w/ prns Outcome: Progressing   Problem: RH BOWEL ELIMINATION Goal: RH STG MANAGE BOWEL WITH ASSISTANCE Description: STG Manage Bowel with supervision Assistance. Outcome: Not Progressing

## 2024-07-18 DIAGNOSIS — I69393 Ataxia following cerebral infarction: Secondary | ICD-10-CM

## 2024-07-18 DIAGNOSIS — R471 Dysarthria and anarthria: Secondary | ICD-10-CM

## 2024-07-18 LAB — CBC
HCT: 39.5 % (ref 36.0–46.0)
Hemoglobin: 13.1 g/dL (ref 12.0–15.0)
MCH: 26 pg (ref 26.0–34.0)
MCHC: 33.2 g/dL (ref 30.0–36.0)
MCV: 78.4 fL — ABNORMAL LOW (ref 80.0–100.0)
Platelets: 156 K/uL (ref 150–400)
RBC: 5.04 MIL/uL (ref 3.87–5.11)
RDW: 14.9 % (ref 11.5–15.5)
WBC: 6.7 K/uL (ref 4.0–10.5)
nRBC: 0 % (ref 0.0–0.2)

## 2024-07-18 LAB — HEPATIC FUNCTION PANEL
ALT: 55 U/L — ABNORMAL HIGH (ref 0–44)
AST: 25 U/L (ref 15–41)
Albumin: 3.4 g/dL — ABNORMAL LOW (ref 3.5–5.0)
Alkaline Phosphatase: 64 U/L (ref 38–126)
Bilirubin, Direct: 0.1 mg/dL (ref 0.0–0.2)
Indirect Bilirubin: 0.2 mg/dL — ABNORMAL LOW (ref 0.3–0.9)
Total Bilirubin: 0.3 mg/dL (ref 0.0–1.2)
Total Protein: 6.6 g/dL (ref 6.5–8.1)

## 2024-07-18 LAB — BASIC METABOLIC PANEL WITH GFR
Anion gap: 8 (ref 5–15)
BUN: 8 mg/dL (ref 6–20)
CO2: 27 mmol/L (ref 22–32)
Calcium: 8.9 mg/dL (ref 8.9–10.3)
Chloride: 106 mmol/L (ref 98–111)
Creatinine, Ser: 1.09 mg/dL — ABNORMAL HIGH (ref 0.44–1.00)
GFR, Estimated: >60 mL/min (ref >60)
Glucose, Bld: 89 mg/dL (ref 70–99)
Potassium: 3.9 mmol/L (ref 3.5–5.1)
Sodium: 141 mmol/L (ref 135–145)

## 2024-07-18 NOTE — Progress Notes (Signed)
 Occupational Therapy Session Note  Patient Details  Name: [name] MRN: [id number] Date of Birth: 1995-11-07  Today's Date: 07/18/2024 OT Individual Time: 9083-8970 OT Individual Time Calculation (min): 73 min    Short Term Goals: Week 1:  OT Short Term Goal 1 (Week 1): Pt will complete U/LB bathing with CGA OT Short Term Goal 2 (Week 1): Pt will maintain L UE in safe position during bed mobility with MIN verbal cues OT Short Term Goal 3 (Week 1): Pt will recall hemi dressing techniques with MIN verbal cues OT Short Term Goal 4 (Week 1): Pt will complete shower transfer with CGA using LRAD  Skilled Therapeutic Interventions/Progress Updates:     Pt received sitting up in wc, dressed for the day with close friend present in the room. Pt presenting with flat affect, receptive to skilled OT session reporting 0/10 pain- OT offering intermittent rest breaks, repositioning, and therapeutic support to optimize participation in therapy session. Focused this session on NMR to improved overall body awareness, activity tolerance, and functional mobility training.  Transported Pt total A to therapy gym in wc for time management and energy conservation. Engaged Pt in completing series of exercises to support motor learning, control, and body awareness:  -Positioned large vertical mirror anterior to Pt for increased visual feedback and donned 4# ankle weights on Pt for increased proprioceptive feedback. Alternating marches completed with MIN HHA +2 x3 trials for 30-45 sec per trial. Improved fluidity of stepping pattern and increased confidence noted in standing position  -Pt transitioned to quadruped position with CGA. Engaged Pt in completing bird dog progressions beginning with alternating forwards reaching moving through full ROM 3x10 reps- Pt able to stabilize self without elbow buckling noted. Pt then completed alternating posterior kicks 3x10 reps. Ataxia noted in trunk and L hemibody during  exercises- improvement noted when cued to push into mat and when incorporating deep breathing into exercise.   Increased time for rest breaks provided between and following trials d/t fatigue. Tactile and verbal cues provided for technique and muscle engagement.   Pt completed ambulatory transfer ~62ft to NuStep using RW with MIN A for balance. Engaged Pt in completing endurance training on NuStep to work on B U/LE reciprocal movement patterns to simulate pattern of functional mobility and improve fluidity and coordination of movements. Pt completed total of 9 min 30 sec with intermittent rest breaks provided PRN. Verbal cues provided for pacing and to initiate rest breaks.  Pt completed functional mobility back to her room for endurance training and to work on body awareness. She was able to complete ~19ft with light MIN A for balance. Ataxia present in trunk and L hemibody.   Pt was left resting in wc with call bell in reach, friend present in room, and all needs met.    Therapy Documentation Precautions:  Precautions Precautions: Fall Recall of Precautions/Restrictions: Intact Restrictions Weight Bearing Restrictions Per Provider Order: No   Therapy/Group: Individual Therapy  Katheryn SHAUNNA [place] 07/18/2024, 9:47 AM

## 2024-07-18 NOTE — Plan of Care (Signed)
  Problem: RH PAIN MANAGEMENT Goal: RH STG PAIN MANAGED AT OR BELOW PT'S PAIN GOAL Description: <4 w/ prns Outcome: Progressing   Problem: Consults Goal: RH STROKE PATIENT EDUCATION Description: See Patient Education module for education specifics  Outcome: Not Progressing   Problem: RH KNOWLEDGE DEFICIT Goal: RH STG INCREASE KNOWLEDGE OF STROKE PROPHYLAXIS Description: Manage increase knowledge of stroke prophylaxis with supervision assistance from friend/ aunt using educational materials provided Outcome: Not Progressing

## 2024-07-18 NOTE — Progress Notes (Signed)
 PROGRESS NOTE   Subjective/Complaints:  Discussed vertebral dissection as primary etiology of CVA.  Also discussed PFO as incidental finding but as potential risk factor for future strokes and need for cardiology f/u   ROS: as per HPI. Denies CP, SOB, abd pain, N/V/D, or any other complaints at this time.       Objective:   No results found. Recent Labs    07/18/24 0526  WBC 6.7  HGB 13.1  HCT 39.5  PLT 156    Recent Labs    07/18/24 0526  NA 141  K 3.9  CL 106  CO2 27  GLUCOSE 89  BUN 8  CREATININE 1.09*  CALCIUM  8.9     Intake/Output Summary (Last 24 hours) at 07/18/2024 0743 Last data filed at 07/17/2024 1837 Gross per 24 hour  Intake 476 ml  Output --  Net 476 ml        Physical Exam: Vital Signs Blood pressure 110/72, pulse (!) 58, temperature 98 F (36.7 C), resp. rate 16, height 5' 4 (1.626 m), weight 65.3 kg, SpO2 99%.   General: No acute distress Mood and affect are appropriate Heart: Regular rate and rhythm no rubs murmurs or extra sounds Lungs: Clear to auscultation, breathing unlabored, no rales or wheezes Abdomen: Positive bowel sounds, soft nontender to palpation, nondistended Extremities: No clubbing, cyanosis, or edema Skin: No evidence of breakdown, no evidence of rash    PRIOR EXAMS: Neuro:  Patient is alert.  Sitting up in bed.  Makes eye contact with examiner.  Follows simple commands.  Differentiates left from right.  Ataxic dysarthria.  Right upper extremity and right lower extremity 5 out of 5 strength, left upper extremity 4 out of 5 proximal and distal, left lower extremity 4+ out of 5, sensation is intact in all 4 extremities Mild/moderate dysmetria LUE and LLE Reduced fine motor LUE with inability to oppose finger to thumb    Assessment/Plan: 1. Functional deficits which require 3+ hours per day of interdisciplinary therapy in a comprehensive inpatient rehab  setting. Physiatrist is providing close team supervision and 24 hour management of active medical problems listed below. Physiatrist and rehab team continue to assess barriers to discharge/monitor patient progress toward functional and medical goals  Care Tool:  Bathing    Body parts bathed by patient: Right arm, Left arm, Chest, Abdomen, Front perineal area, Right upper leg, Left upper leg, Right lower leg, Left lower leg, Face, Buttocks   Body parts bathed by helper: Buttocks, Right arm     Bathing assist Assist Level: Supervision/Verbal cueing     Upper Body Dressing/Undressing Upper body dressing   What is the patient wearing?: Pull over shirt    Upper body assist Assist Level: Supervision/Verbal cueing    Lower Body Dressing/Undressing Lower body dressing      What is the patient wearing?: Pants, Underwear/pull up     Lower body assist Assist for lower body dressing: Minimal Assistance - Patient > 75%     Toileting Toileting    Toileting assist Assist for toileting: Minimal Assistance - Patient > 75%     Transfers Chair/bed transfer  Transfers assist     Chair/bed  transfer assist level: Moderate Assistance - Patient 50 - 74%     Locomotion Ambulation   Ambulation assist      Assist level: Moderate Assistance - Patient 50 - 74% Assistive device: No Device Max distance: 150'   Walk 10 feet activity   Assist     Assist level: Moderate Assistance - Patient - 50 - 74% Assistive device: No Device   Walk 50 feet activity   Assist    Assist level: Moderate Assistance - Patient - 50 - 74% Assistive device: No Device    Walk 150 feet activity   Assist    Assist level: Moderate Assistance - Patient - 50 - 74% Assistive device: No Device    Walk 10 feet on uneven surface  activity   Assist     Assist level: Moderate Assistance - Patient - 50 - 74%     Wheelchair     Assist Is the patient using a wheelchair?: Yes Type of  Wheelchair: Manual    Wheelchair assist level: Dependent - Patient 0% Max wheelchair distance: 150'    Wheelchair 50 feet with 2 turns activity    Assist        Assist Level: Dependent - Patient 0%   Wheelchair 150 feet activity     Assist      Assist Level: Dependent - Patient 0%   Blood pressure 110/72, pulse (!) 58, temperature 98 F (36.7 C), resp. rate 16, height 5' 4 (1.626 m), weight 65.3 kg, SpO2 99%.  Medical Problem List and Plan: 1. 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  Plan repeat CTA 2-3 months to evaluate for improvement dissection             -patient may shower             -ELOS/Goals: 10-12 days MinA             -Continue CIR   2.  Antithrombotics: -DVT/anticoagulation:  Pharmaceutical: Heparin  x 48 hours done  amb distance not recorded, goal is 150' will ask PT -antiplatelet therapy: continue Aspirin  81 mg daily and Plavix  75 mg daily x 3 months then aspirin  alone   3. Pain Management: continue Oxycodone /Fioricet  as needed, tylenol  PRN   4. Mood/Behavior/Sleep/MDD/ADHD: continue Celexa  20 mg daily.  Provide emotional support.  Psychiatry consulted             -antipsychotic agents: N/A - Patient was seen by psychology 9/11, continue current regimen and call back if further assistance needed.  Psychiatry is signed off -Melatonin PRN   5. Neuropsych/cognition: This patient is not capable of making decisions on her own behalf.  - Patient seen by neuropsychology for depression, appreciate insight   6. Skin/Wound Care: Routine skin checks   7. Fluids/Electrolytes/Nutrition: Routine ins and outs with follow-up chemistries BMET looks good, check Vit D levels   8.  PFO.  TEE showed positive PFO, at rest LTR shunting and with Valsalva RTL shunting.  Follow-up outpatient cardiology service for question PFO closure    9.  Hyperlipidemia: continue Crestor  20 mg daily, checking lipoprotein A    10.  Microcytosis/Thrombocytopenia low normal ferritin.  Placed on iron supplement - 9/11 hemoglobin stable at 13.1, MCV 77.  Appears chronic.  Thalassemia workup?  Recheck CBC Monday ordered  11.  Transaminitis.  Improved, ALT mildly elevated at 47.  Continue to monitor  Recheck Monday ordered  12. Constipation  -9/12 order milk of mg 30ml -07/16/24 no BM still, started colace 200mg  daily, also taking miralax  BID. If no BM by tomorrow, would try sorbitol .  -07/17/24 no BM still, sorbitol  60ml ordered  LOS: 5 days A FACE TO FACE EVALUATION WAS PERFORMED  Cassidy Bruce 07/18/2024, 7:43 AM

## 2024-07-18 NOTE — Progress Notes (Signed)
 Speech Language Pathology Daily Session Note  Patient Details  Name: Keiera Strathman MRN: [id number] Date of Birth: 07/07/1996  Today's Date: 07/18/2024 SLP Individual Time: 8756-8655 SLP Individual Time Calculation (min): 61 min  Short Term Goals: Week 1: SLP Short Term Goal 1 (Week 1): Patient will recall and utilize memory strategies with min multimodal A SLP Short Term Goal 2 (Week 1): Patient will recall cognitive deficits with min multimodal A SLP Short Term Goal 3 (Week 1): Patient will demonstrate mildly complex problem solving skills with min multimodal A SLP Short Term Goal 4 (Week 1): Patient will consume least restrictive diet with use of safe swallowing strategies given supervision verbal A SLP Short Term Goal 5 (Week 1): Patient will utilize dysarthria strategies to increase fluency given min multimodal A  Skilled Therapeutic Interventions: Skilled therapy session focused on communication goals. SLP facilitated session by targeting respiratory support through sustained phonation. Patient completed x5 repetitions of /s/ and /z/. Patient with an average sustained phonation of 11 seconds with /s/ and 8 seconds with /z/. SLP continued to target speech through prosody exercise. Patient required maxA to alter sentences from statements to questions. SLP utilized voice recordings to educate patient on her performance. Lastly, SLP targeted speech intelligbility and pronunciation of similar words. Patient required minA to read sentences, often requiring respiratory rest breaks. Patient left in chair with alarm set and call bell in reach. Continue POC.   Pain None reported   Therapy/Group: Individual Therapy  Yarieliz Wasser M.A., CCC-SLP 07/18/2024, 12:27 PM

## 2024-07-18 NOTE — Progress Notes (Signed)
 Physical Therapy Session Note  Patient Details  Name: Cassidy Bruce MRN: [id number] Date of Birth: 07-25-1996  {CHL IP REHAB PT TIME CALCULATION:304800500}  Short Term Goals: {DUH:6958314}  Skilled Therapeutic Interventions/Progress Updates:      Therapy Documentation Precautions:  Precautions Precautions: Fall Recall of Precautions/Restrictions: Intact Restrictions Weight Bearing Restrictions Per Provider Order: No General:   Vital Signs: Therapy Vitals Temp: 98 F (36.7 C) Pulse Rate: (!) 58 Resp: 16 BP: 110/72 Patient Position (if appropriate): Lying Oxygen Therapy SpO2: 99 % O2 Device: Room Air Pain:   Mobility:   Locomotion :    Trunk/Postural Assessment :    Balance:   Exercises:   Other Treatments:      Therapy/Group: {Therapy/Group:[id number]}  Mliss [name] 07/18/2024, 8:01 AM

## 2024-07-19 LAB — VITAMIN D 25 HYDROXY (VIT D DEFICIENCY, FRACTURES): Vit D, 25-Hydroxy: 20.66 ng/mL — ABNORMAL LOW (ref 30–100)

## 2024-07-19 NOTE — Progress Notes (Signed)
 Occupational Therapy Session Note  Patient Details  Name: Cassidy Bruce MRN: [id number] Date of Birth: 09/14/1996  Today's Date: 07/19/2024 OT Individual Time: 9269-9173 OT Individual Time Calculation (min): 56 min    Short Term Goals: Week 1:  OT Short Term Goal 1 (Week 1): Pt will complete U/LB bathing with CGA OT Short Term Goal 2 (Week 1): Pt will maintain L UE in safe position during bed mobility with MIN verbal cues OT Short Term Goal 3 (Week 1): Pt will recall hemi dressing techniques with MIN verbal cues OT Short Term Goal 4 (Week 1): Pt will complete shower transfer with CGA using LRAD  Skilled Therapeutic Interventions/Progress Updates:     Pt received deeply sleeping in bed, presenting with flat affect receptive to skilled OT session reporting 0/10 pain- OT offering intermittent rest breaks, repositioning, and therapeutic support to optimize participation in therapy session. Pt requesting to take shower this AM. Focused this session on ADL retraining with emphasis on safety awareness and L UE functional use. Supine > EOB SUP. Engaged Pt in completing functional mobility within her room to gather clothing items- CGA-MIN A required for balance d/t trunkle ataxia. Slowed cadence and decreased balance strategies noted. She retrieved clothing items from closet using L UE with CGA provided for dynamic standing balance. Doffed clothes while seated on TTB with SUP, completing lateral leans to bring pants off her waist. During shower, Pt able to complete U/LB bathing with close SUP. Facilitated forced use of L UE during bathing by having Pt utilize L UE to manage shower head and to wash R hemi-body. Improved control noted overall, however Pt does become mildly frustrated with herself when her L UE does not move as smoothly/quickly as she desires. Gentle re-education on CVA recovery process provided. Following shower, Pt able to dry self with SUP. Ambulated back to bed with MIN A provided for  balance. Applied lotion with set-up A +increased time d/t slowed motor movements on L UE. U/LB dressing completed sitting EOB donning OH shirt with SUP and underwear/pants with CGA provided when brining pants to waist. Pt stood at sink no AD with CGA while brushing teeth- Pt able to manipulate toiletry items and donn toothpaste with increased time and maximal effort utilizing L UE at a diminished level. MD in/out for morning rounding. Pt receptive to staying up in wc to eat her breakfast- attempting to open water  bottle and motivated to utilize L UE functionally, however required assistance d/t tight cap. Pt was left resting in wc with call bell in reach, seatbelt alarm on, and all needs met.    Therapy Documentation Precautions:  Precautions Precautions: Fall Recall of Precautions/Restrictions: Intact Restrictions Weight Bearing Restrictions Per Provider Order: No   Therapy/Group: Individual Therapy  Katheryn SHAUNNA [place] 07/19/2024, 7:17 AM

## 2024-07-19 NOTE — Progress Notes (Signed)
 PROGRESS NOTE   Subjective/Complaints:  Standing for ADLs  ROS: as per HPI. Denies CP, SOB, abd pain, N/V/D, or any other complaints at this time.       Objective:   No results found. Recent Labs    07/18/24 0526  WBC 6.7  HGB 13.1  HCT 39.5  PLT 156    Recent Labs    07/18/24 0526  NA 141  K 3.9  CL 106  CO2 27  GLUCOSE 89  BUN 8  CREATININE 1.09*  CALCIUM  8.9     Intake/Output Summary (Last 24 hours) at 07/19/2024 0758 Last data filed at 07/18/2024 1843 Gross per 24 hour  Intake 360 ml  Output --  Net 360 ml        Physical Exam: Vital Signs Blood pressure 112/75, pulse 75, temperature 98.4 F (36.9 C), temperature source Oral, resp. rate 20, height 5' 4 (1.626 m), weight 65.3 kg, SpO2 96%.   General: No acute distress Mood and affect are appropriate Heart: Regular rate and rhythm no rubs murmurs or extra sounds Lungs: Clear to auscultation, breathing unlabored, no rales or wheezes Abdomen: Positive bowel sounds, soft nontender to palpation, nondistended Extremities: No clubbing, cyanosis, or edema Skin: No evidence of breakdown, no evidence of rash    PRIOR EXAMS: Neuro:  Patient is alert.  Sitting up in bed. .  Ataxic dysarthria.  Right upper extremity and right lower extremity 5 out of 5 strength, left upper extremity 4 out of 5 proximal and distal, left lower extremity 4+ out of 5, sensation is intact in all 4 extremities Mild/moderate dysmetria LUE and LLE Reduced fine motor LUE with inability to oppose finger to thumb    Assessment/Plan: 1. Functional deficits which require 3+ hours per day of interdisciplinary therapy in a comprehensive inpatient rehab setting. Physiatrist is providing close team supervision and 24 hour management of active medical problems listed below. Physiatrist and rehab team continue to assess barriers to discharge/monitor patient progress toward functional  and medical goals  Care Tool:  Bathing    Body parts bathed by patient: Right arm, Left arm, Chest, Abdomen, Front perineal area, Right upper leg, Left upper leg, Right lower leg, Left lower leg, Face, Buttocks   Body parts bathed by helper: Buttocks, Right arm     Bathing assist Assist Level: Supervision/Verbal cueing     Upper Body Dressing/Undressing Upper body dressing   What is the patient wearing?: Pull over shirt    Upper body assist Assist Level: Supervision/Verbal cueing    Lower Body Dressing/Undressing Lower body dressing      What is the patient wearing?: Pants, Underwear/pull up     Lower body assist Assist for lower body dressing: Minimal Assistance - Patient > 75%     Toileting Toileting    Toileting assist Assist for toileting: Minimal Assistance - Patient > 75%     Transfers Chair/bed transfer  Transfers assist     Chair/bed transfer assist level: Moderate Assistance - Patient 50 - 74%     Locomotion Ambulation   Ambulation assist      Assist level: Moderate Assistance - Patient 50 - 74% Assistive device: No Device  Max distance: 150'   Walk 10 feet activity   Assist     Assist level: Moderate Assistance - Patient - 50 - 74% Assistive device: No Device   Walk 50 feet activity   Assist    Assist level: Moderate Assistance - Patient - 50 - 74% Assistive device: No Device    Walk 150 feet activity   Assist    Assist level: Moderate Assistance - Patient - 50 - 74% Assistive device: No Device    Walk 10 feet on uneven surface  activity   Assist     Assist level: Moderate Assistance - Patient - 50 - 74%     Wheelchair     Assist Is the patient using a wheelchair?: Yes Type of Wheelchair: Manual    Wheelchair assist level: Dependent - Patient 0% Max wheelchair distance: 150'    Wheelchair 50 feet with 2 turns activity    Assist        Assist Level: Dependent - Patient 0%   Wheelchair 150  feet activity     Assist      Assist Level: Dependent - Patient 0%   Blood pressure 112/75, pulse 75, temperature 98.4 F (36.9 C), temperature source Oral, resp. rate 20, height 5' 4 (1.626 m), weight 65.3 kg, SpO2 96%.  Medical Problem List and Plan: 1. 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  Plan repeat CTA 2-3 months to evaluate for improvement dissection             -patient may shower             -ELOS/Goals: 10-12 days MinA             -Continue CIR   2.  Antithrombotics: -DVT/anticoagulation:  Pharmaceutical: Heparin  x 48 hours done  amb distance not recorded, goal is 150' will ask PT -antiplatelet therapy: continue Aspirin  81 mg daily and Plavix  75 mg daily x 3 months then aspirin  alone   3. Pain Management: continue Oxycodone /Fioricet  as needed, tylenol  PRN   4. Mood/Behavior/Sleep/MDD/ADHD: continue Celexa  20 mg daily.  Provide emotional support.  Psychiatry consulted             -antipsychotic agents: N/A - Patient was seen by psychology 9/11, continue current regimen and call back if further assistance needed.  Psychiatry is signed off -Melatonin PRN   5. Neuropsych/cognition: This patient is not capable of making decisions on her own behalf.  - Patient seen by neuropsychology for depression, appreciate insight   6. Skin/Wound Care: Routine skin checks   7. Fluids/Electrolytes/Nutrition: Routine ins and outs with follow-up chemistries BMET looks good, check Vit D levels   8.  PFO.  TEE showed positive PFO, at rest LTR shunting and with Valsalva RTL shunting.  Follow-up outpatient cardiology service for question PFO closure   9.  Hyperlipidemia: continue Crestor  20 mg daily, checking lipoprotein A    10.  Microcytosis/Thrombocytopenia low normal ferritin.  Placed on iron supplement - 9/11 hemoglobin stable at 13.1, MCV 77.   Appears chronic.  Thalassemia workup?  Recheck CBC Monday ordered  11.  Transaminitis.  Improved, ALT mildly elevated at 47.  Continue to monitor  Recheck Monday ordered  12. Constipation  -9/12 order milk of mg 30ml -07/16/24 no BM still, started colace 200mg  daily, also taking miralax  BID. If no BM by tomorrow, would try sorbitol .  -07/17/24 no BM still, sorbitol  60ml  ordered  LOS: 6 days A FACE TO FACE EVALUATION WAS PERFORMED  Prentice FORBES Compton 07/19/2024, 7:58 AM

## 2024-07-19 NOTE — Progress Notes (Signed)
 Speech Language Pathology Daily Session Note  Patient Details  Name: Cassidy Bruce MRN: [id number] Date of Birth: 26-Mar-1996  Today's Date: 07/19/2024 SLP Individual Time: 1302-1400 SLP Individual Time Calculation (min): 58 min  Short Term Goals: Week 1: SLP Short Term Goal 1 (Week 1): Patient will recall and utilize memory strategies with min multimodal A SLP Short Term Goal 2 (Week 1): Patient will recall cognitive deficits with min multimodal A SLP Short Term Goal 3 (Week 1): Patient will demonstrate mildly complex problem solving skills with min multimodal A SLP Short Term Goal 4 (Week 1): Patient will consume least restrictive diet with use of safe swallowing strategies given supervision verbal A SLP Short Term Goal 5 (Week 1): Patient will utilize dysarthria strategies to increase fluency given min multimodal A  Skilled Therapeutic Interventions:   SLP conducted skilled therapy session targeting cognition goals. SLP began by reviewing WRAP memory strategies. Clinician then facilitated memory recall task of 12 random words. Patient instructed to review word list using WRAP memory strategy. After ~61min to review, patient independently recalled 6/12 words. To enhance retention, patient categorized same 12 words into 3 categories then memorized list again. After ~65min review, pt recalled 9/12 items independently. To target problem solving, SLP facilitated mild to moderately complex deductive reasoning tasks. Patient completed moderately complex task with supervision overall. Patient moved quickly through task but as soon as she became stuck, she abruptly ceased task completion instead of asking for assistance. Patient modI for mildly complex task, though did not finish as she stated being short of breath, despite no increased WOB nor visible symptoms. Patient denies anxiety. Patient was left in room with call bell in reach and alarm set. SLP will continue to target goals per plan of care.      Pain  none  Therapy/Group: Individual Therapy  Laela Deviney Kwakye-Ndlovu 07/19/2024, 3:02 PM

## 2024-07-19 NOTE — Plan of Care (Signed)
  Problem: Consults Goal: RH STROKE PATIENT EDUCATION Description: See Patient Education module for education specifics  Outcome: Progressing   Problem: RH BOWEL ELIMINATION Goal: RH STG MANAGE BOWEL WITH ASSISTANCE Description: STG Manage Bowel with supervision Assistance. Outcome: Progressing   Problem: RH BLADDER ELIMINATION Goal: RH STG MANAGE BLADDER WITH ASSISTANCE Description: STG Manage Bladder With supervision Assistance Outcome: Progressing   Problem: RH SKIN INTEGRITY Goal: RH STG SKIN FREE OF INFECTION/BREAKDOWN Description: Manage skin free of infection with supervision assistance Outcome: Progressing   Problem: RH SAFETY Goal: RH STG ADHERE TO SAFETY PRECAUTIONS W/ASSISTANCE/DEVICE Description: STG Adhere to Safety Precautions With Assistance/Device. Outcome: Progressing   Problem: RH PAIN MANAGEMENT Goal: RH STG PAIN MANAGED AT OR BELOW PT'S PAIN GOAL Description: <4 w/ prns Outcome: Progressing   Problem: RH KNOWLEDGE DEFICIT Goal: RH STG INCREASE KNOWLEGDE OF HYPERLIPIDEMIA Description: Manage increase knowledge of hyperlipidemia with supervision assistance from friend/ aunt using educational materials provided Outcome: Progressing Goal: RH STG INCREASE KNOWLEDGE OF STROKE PROPHYLAXIS Description: Manage increase knowledge of stroke prophylaxis with supervision assistance from friend/ aunt using educational materials provided Outcome: Progressing

## 2024-07-19 NOTE — Progress Notes (Signed)
 Speech Language Pathology Daily Session Note  Patient Details  Name: Cassidy Bruce MRN: [id number] Date of Birth: 15-Sep-1996  Today's Date: 07/19/2024 SLP Individual Time: 1000-1057 SLP Individual Time Calculation (min): 57 min  Short Term Goals: Week 1: SLP Short Term Goal 1 (Week 1): Patient will recall and utilize memory strategies with min multimodal A SLP Short Term Goal 2 (Week 1): Patient will recall cognitive deficits with min multimodal A SLP Short Term Goal 3 (Week 1): Patient will demonstrate mildly complex problem solving skills with min multimodal A SLP Short Term Goal 4 (Week 1): Patient will consume least restrictive diet with use of safe swallowing strategies given supervision verbal A SLP Short Term Goal 5 (Week 1): Patient will utilize dysarthria strategies to increase fluency given min multimodal A  Skilled Therapeutic Interventions: Skilled therapy session focused on cognitive and communication goals. SLP facilitated session by educating patient on memory strategies (write, repeat, associate, picture) and providing a handout to aid in recall. SLP then provided examples of each and encouraged use through memory book and paragraph retention. Given a verbalized story, patient recalled information after a 15 minute delay with 90% accuracy independently. During time delay, patient completed sustained phonation task of /s/ and /z/ with improved respiratory support. Patient continues to present with halting, dysprosodic speech, however intelligible. Patient left in bed with alarm set and call bell in reach. Continue POC  Pain None reported to SLP  Therapy/Group: Individual Therapy  Lauraine Crespo M.A., CCC-SLP 07/19/2024, 7:35 AM

## 2024-07-20 LAB — LIPOPROTEIN A (LPA): Lipoprotein (a): 162.7 nmol/L — ABNORMAL HIGH (ref <75.0)

## 2024-07-20 MED ORDER — ROSUVASTATIN CALCIUM 20 MG PO TABS
40.0000 mg | ORAL_TABLET | Freq: Every day | ORAL | Status: DC
  Administered 2024-07-21 – 2024-08-03 (×14): 40 mg via ORAL
  Filled 2024-07-20 (×9): qty 2
  Filled 2024-07-20: qty 8
  Filled 2024-07-20 (×4): qty 2

## 2024-07-20 NOTE — Plan of Care (Signed)
  Problem: Consults Goal: RH STROKE PATIENT EDUCATION Description: See Patient Education module for education specifics  Outcome: Progressing   Problem: RH BOWEL ELIMINATION Goal: RH STG MANAGE BOWEL WITH ASSISTANCE Description: STG Manage Bowel with supervision Assistance. Outcome: Progressing   Problem: RH BLADDER ELIMINATION Goal: RH STG MANAGE BLADDER WITH ASSISTANCE Description: STG Manage Bladder With supervision Assistance Outcome: Progressing   Problem: RH SKIN INTEGRITY Goal: RH STG SKIN FREE OF INFECTION/BREAKDOWN Description: Manage skin free of infection with supervision assistance Outcome: Progressing   Problem: RH SAFETY Goal: RH STG ADHERE TO SAFETY PRECAUTIONS W/ASSISTANCE/DEVICE Description: STG Adhere to Safety Precautions With Assistance/Device. Outcome: Progressing   Problem: RH PAIN MANAGEMENT Goal: RH STG PAIN MANAGED AT OR BELOW PT'S PAIN GOAL Description: <4 w/ prns Outcome: Progressing   Problem: RH KNOWLEDGE DEFICIT Goal: RH STG INCREASE KNOWLEGDE OF HYPERLIPIDEMIA Description: Manage increase knowledge of hyperlipidemia with supervision assistance from friend/ aunt using educational materials provided Outcome: Progressing Goal: RH STG INCREASE KNOWLEDGE OF STROKE PROPHYLAXIS Description: Manage increase knowledge of stroke prophylaxis with supervision assistance from friend/ aunt using educational materials provided Outcome: Progressing

## 2024-07-20 NOTE — Progress Notes (Signed)
 Physical Therapy Session Note  Patient Details  Name: Cassidy Bruce MRN: [id number] Date of Birth: 16-Dec-1995  Today's Date: 07/20/2024 PT Individual Time: 0930-1010 PT Individual Time Calculation (min): 40 min   Short Term Goals: Week 1:  PT Short Term Goal 1 (Week 1): Pt will perform sit to stand with CGA consistently. PT Short Term Goal 2 (Week 1): Pt will perform bed to chair with CGA consistently. PT Short Term Goal 3 (Week 1): Pt will ambulate x150' with CGA and LRAD. PT Short Term Goal 4 (Week 1): Pt will complete balance assessment.  Skilled Therapeutic Interventions/Progress Updates:  Patient supine in bed on entrance to room. Patient alert and agreeable to PT session. Continues to demo improving mood with good smile.   Patient with no pain complaint at start of session.  Therapeutic Activity: Bed Mobility: Pt performed supine <> sit with Mod I with no cueing required.  Transfers: Pt performed sit<>stand transfers with focus and close supervision. Stand pivot transfers throughout session with close supervision/ CGA for trunkal ataxia. Provided vc/ tc for focus on relaxing shoulders and neck as well as to increased core tightening to help correct.   Gait Training:  Pt ambulated >330' x2 using RW to complete first distance with SBA/ CGA. Demos improvement in control of L foot placement but continues to demo uncontrolled hyperextension LLE>RLE. Good, upright posture noted.   Is able to perform return bout without AD for first 200 ft requiring CGA and then completing distance using RW d/t fatigue and requiring SBA. Without AD, she demos need to progress into more controlled foot placement of LLE requiring practice and vc for improved step length as well as to slightly widen BOS. With addition of RW, pt struggles initially with L foot placement but is able to improve in quality of controlled LLE advancement quickly with minimal vc/ tc for technique.  Neuromuscular Re-ed: NMR  facilitated during session with focus on standing balance, motor control. Pt guided in dynamic step out to mirror and placement of Squigz using contralateral UE from LE used to step out. Then same movement required to hang small rings on Squigz on mirror. Then next standing bout to remove with called UE with same contralateral step out. Pt requires light CGA to complete with only one LOB noted with MinA to maintain balance with low reach forward to mirror. Noted zeroing in with LUE with pt intermittently using RUE to help in more coordinated, smooth movement in order to complete task. NMR performed for improvements in motor control and coordination, balance, sequencing, judgement, and self confidence/ efficacy in performing all aspects of mobility at highest level of independence.   Patient supine in bed at end of session with brakes locked, bed alarm set, and all needs within reach.   Therapy Documentation Precautions:  Precautions Precautions: Fall Recall of Precautions/Restrictions: Intact Restrictions Weight Bearing Restrictions Per Provider Order: No  Pain:  No pain related this session.    Therapy/Group: Individual Therapy  Mliss DELENA Milliner PT, DPT, CSRS 07/20/2024, 5:45 PM

## 2024-07-20 NOTE — Progress Notes (Signed)
 Patient ID: Cassidy Bruce, female   DOB: Dec 17, 1995, 29 y.o.   MRN: [id number]  met with pt and her Aunt  Claudilla was present she gave worker permission to speak in front of her aunt. Explained goals of mod/I level and discharge date 10/1. Want for her to get to mod/I so she will not require physical care at discharge, since their is really no one to provide this. Aunt does not want her to go back with her friend since she is trying to get her life together and offered to take pt in after found her living in her car. Aunt had her at her home but this did not work out and pt left there. Pt plans on going with her friend. Aunt would like to find a apartment for pt and will be asking family to assist with the cost of this. Provided limited options out there and discussed it goes back to the family to assist pt. This was discussed with them prior to admitting her to rehab. Pt is uninsured and young so options are limited. Aunt to work on place for pt and is aware of the discharge date. Pt is stubborn and once she is done discussing a topic she turns her back and focuses on her ipad. Continue to work on discharge needs.

## 2024-07-20 NOTE — Progress Notes (Signed)
 Occupational Therapy Session Note  Patient Details  Name: Cassidy Bruce MRN: [id number] Date of Birth: 09-Sep-1996  Today's Date: 07/20/2024 OT Individual Time: 1401-1430 OT Individual Time Calculation (min): 29 min    Short Term Goals: Week 1:  OT Short Term Goal 1 (Week 1): Pt will complete U/LB bathing with CGA OT Short Term Goal 2 (Week 1): Pt will maintain L UE in safe position during bed mobility with MIN verbal cues OT Short Term Goal 3 (Week 1): Pt will recall hemi dressing techniques with MIN verbal cues OT Short Term Goal 4 (Week 1): Pt will complete shower transfer with CGA using LRAD  Skilled Therapeutic Interventions/Progress Updates:    Pt received resting in bed presenting to be in good spirits receptive to skilled OT session reporting 0/10 pain- OT offering intermittent rest breaks, repositioning, and therapeutic support to optimize participation in therapy session. Upon OT arrival, asked about seeing therapy dog, Pt agreed. Pt very motivated to come EOB quickly with cueing to slow down for safety. Pt donned hand sanitizer and pet therapy dog to improve mood. Encouraged BUE use and did well with gentle, smooth strokes with LUE when petting dog. Transitioned from therapy dog, Pt ambulated to [facility] approximately 5 ft with CGA with no AD. Pt transported outside via [facility] d/t time management. Provided education on theraputty, benefits, and taking rest breaks. Demonstrated rolling, squeezing, pinching, and pulling apart. Pt able to teach back as evidence of learning with MIN verbal cues. Pt transported back to room via WC d/t time management. Ambulated approximately 67ft to EOB with close SUP and CGA for sitting EOB. Pt was left sitting EOB with call bell in reach, bed alarm on, and all needs met.    Therapy Documentation Precautions:  Precautions Precautions: Fall Recall of Precautions/Restrictions: Intact Restrictions Weight Bearing Restrictions Per Provider Order:  No  Therapy/Group: Individual Therapy  Paulina Fleeta Dixie 07/20/2024, 3:02 PM

## 2024-07-20 NOTE — Progress Notes (Signed)
 Speech Language Pathology Daily Session Note  Patient Details  Name: Cassidy Bruce MRN: [id number] Date of Birth: [birth year]  Today's Date: 07/20/2024 SLP Individual Time: 1305-1400 SLP Individual Time Calculation (min): 55 min  Short Term Goals: Week 1: SLP Short Term Goal 1 (Week 1): Patient will recall and utilize memory strategies with min multimodal A SLP Short Term Goal 2 (Week 1): Patient will recall cognitive deficits with min multimodal A SLP Short Term Goal 3 (Week 1): Patient will demonstrate mildly complex problem solving skills with min multimodal A SLP Short Term Goal 4 (Week 1): Patient will consume least restrictive diet with use of safe swallowing strategies given supervision verbal A SLP Short Term Goal 5 (Week 1): Patient will utilize dysarthria strategies to increase fluency given min multimodal A  Skilled Therapeutic Interventions:   SLP conducted skilled therapy session targeting cognition goals. Upon arrival, patient sleeping in bed. Patient's aunt present throughout session. SLP facilitated three mild to moderately complex tasks to target problem solving and memory. Patient minA on shape decoding task for 20/20 calculations. During visual recall task, patient recalled all information with 100% accuracy with modI. During final task, patient modI for memory and up to minA for problem solving. Patient benefited from verbal cues to review incorrect answer choices. Throughout session, noted halting speech and inconsistent airflow. In remaining minutes of session, introduced diaphragmatic breathing. SLP provided models and total cues, though patient demonstrated clavicular breathing across all attempts to replicate SLP breathing. Attempted to facilitate improved diaphragmatic breathing by having patient lay down, which did help to some degree, but thoracic tension was still present across all trials. Patient was left in room with call bell in reach and alarm set. SLP will continue  to target goals per plan of care.     Pain   none  Therapy/Group: Individual Therapy  Dozier Alken 07/20/2024, 3:38 PM

## 2024-07-20 NOTE — Progress Notes (Signed)
 Physical Therapy Session Note  Patient Details  Name: Cassidy Bruce MRN: [id number] Date of Birth: Mar 03, 1996  Today's Date: 07/19/2024 PT Individual Time: 1120-1205 PT Individual Time Calculation (min): 45 min  Short Term Goals: Week 1:  PT Short Term Goal 1 (Week 1): Pt will perform sit to stand with CGA consistently. PT Short Term Goal 2 (Week 1): Pt will perform bed to chair with CGA consistently. PT Short Term Goal 3 (Week 1): Pt will ambulate x150' with CGA and LRAD. PT Short Term Goal 4 (Week 1): Pt will complete balance assessment.  Skilled Therapeutic Interventions/Progress Updates:  Patient supine in bed on entrance to room. Patient alert and agreeable to PT session.   Patient with no pain complaint at start of session.  Therapeutic Activity: Bed Mobility: Pt performed supine <> sit with supervision. No cueing required. Transfers: Pt performed sit<>stand and stand pivot transfers throughout session with CGA without focus and supervision with focus to positioning and movements. Provided vc for maintaining focus to intended movements throughout.  Gait Training:  Pt ambulated *** ft using *** with ***. Demonstrated ***. Provided vc/ tc for ***.  Neuromuscular Re-ed: NMR facilitated during session with focus on ***. Pt guided in ***. NMR performed for improvements in motor control and coordination, balance, sequencing, judgement, and self confidence/ efficacy in performing all aspects of mobility at highest level of independence.   Patient *** at end of session with brakes locked, *** alarm set, and all needs within reach.   Therapy Documentation Precautions:  Precautions Precautions: Fall Recall of Precautions/Restrictions: Intact Restrictions Weight Bearing Restrictions Per Provider Order: No  Pain:  No pain related this session.   Therapy/Group: Individual Therapy  Mliss DELENA Milliner PT, DPT, CSRS 07/19/2024, 6:32 AM

## 2024-07-20 NOTE — Progress Notes (Signed)
 Physical Therapy Session Note  Patient Details  Name: Cassidy Bruce MRN: [id number] Date of Birth: 1996-08-12  Today's Date: 07/20/2024 PT Missed Time: 20 Minutes Missed Time Reason: Patient fatigue  Short Term Goals: Week 1:  PT Short Term Goal 1 (Week 1): Pt will perform sit to stand with CGA consistently. PT Short Term Goal 2 (Week 1): Pt will perform bed to chair with CGA consistently. PT Short Term Goal 3 (Week 1): Pt will ambulate x150' with CGA and LRAD. PT Short Term Goal 4 (Week 1): Pt will complete balance assessment.  Skilled Therapeutic Interventions/Progress Updates:  Patient sidelying to L side in bed on entrance to room. Patient asleep and does not wake to auditory or gentle physical stimulation. Pt faitgued from full day of therapy and allowed to sleep. No indication of pain.   Pt missed of skilled therapy due to fatigue. Some time has already been made up this morning. Will re-attempt as schedule and pt availability permits.   Therapy Documentation Precautions:  Precautions Precautions: Fall Recall of Precautions/Restrictions: Intact Restrictions Weight Bearing Restrictions Per Provider Order: No General: PT Amount of Missed Time (min): 20 Minutes PT Missed Treatment Reason: Patient fatigue  Pain:  No appearance of pain.   Therapy/Group: Individual Therapy  Mliss DELENA Milliner PT, DPT, CSRS 07/20/2024, 5:49 PM

## 2024-07-20 NOTE — Progress Notes (Signed)
 PROGRESS NOTE   Subjective/Complaints:  No issues overnite , slept well , no pain   ROS: as per HPI. Denies CP, SOB, abd pain, N/V/D, or any other complaints at this time.    Objective:   No results found. Recent Labs    07/18/24 0526  WBC 6.7  HGB 13.1  HCT 39.5  PLT 156    Recent Labs    07/18/24 0526  NA 141  K 3.9  CL 106  CO2 27  GLUCOSE 89  BUN 8  CREATININE 1.09*  CALCIUM  8.9     Intake/Output Summary (Last 24 hours) at 07/20/2024 0752 Last data filed at 07/19/2024 1818 Gross per 24 hour  Intake 840 ml  Output --  Net 840 ml        Physical Exam: Vital Signs Blood pressure 114/81, pulse 62, temperature 98.7 F (37.1 C), temperature source Oral, resp. rate 20, height 5' 4 (1.626 m), weight 65.3 kg, SpO2 98%.   General: No acute distress Mood and affect are appropriate Heart: Regular rate and rhythm no rubs murmurs or extra sounds Lungs: Clear to auscultation, breathing unlabored, no rales or wheezes Abdomen: Positive bowel sounds, soft nontender to palpation, nondistended Extremities: No clubbing, cyanosis, or edema Skin: No evidence of breakdown, no evidence of rash    PRIOR EXAMS: Neuro:  Patient is alert.  Sitting up in bed. .  Ataxic dysarthria.  Right upper extremity and right lower extremity 5 out of 5 strength, left upper extremity 4 out of 5 proximal and distal, left lower extremity 4+ out of 5, sensation is intact in all 4 extremities moderate dysmetria LUE FNF and LLE HS Reduced fine motor LUE with inability to oppose finger to thumb    Assessment/Plan: 1. Functional deficits which require 3+ hours per day of interdisciplinary therapy in a comprehensive inpatient rehab setting. Physiatrist is providing close team supervision and 24 hour management of active medical problems listed below. Physiatrist and rehab team continue to assess barriers to discharge/monitor patient  progress toward functional and medical goals  Care Tool:  Bathing    Body parts bathed by patient: Right arm, Left arm, Chest, Abdomen, Front perineal area, Right upper leg, Left upper leg, Right lower leg, Left lower leg, Face, Buttocks   Body parts bathed by helper: Buttocks, Right arm     Bathing assist Assist Level: Supervision/Verbal cueing     Upper Body Dressing/Undressing Upper body dressing   What is the patient wearing?: Pull over shirt    Upper body assist Assist Level: Supervision/Verbal cueing    Lower Body Dressing/Undressing Lower body dressing      What is the patient wearing?: Pants, Underwear/pull up     Lower body assist Assist for lower body dressing: Minimal Assistance - Patient > 75%     Toileting Toileting    Toileting assist Assist for toileting: Minimal Assistance - Patient > 75%     Transfers Chair/bed transfer  Transfers assist     Chair/bed transfer assist level: Moderate Assistance - Patient 50 - 74%     Locomotion Ambulation   Ambulation assist      Assist level: Moderate Assistance - Patient 50 -  74% Assistive device: No Device Max distance: 150'   Walk 10 feet activity   Assist     Assist level: Moderate Assistance - Patient - 50 - 74% Assistive device: No Device   Walk 50 feet activity   Assist    Assist level: Moderate Assistance - Patient - 50 - 74% Assistive device: No Device    Walk 150 feet activity   Assist    Assist level: Moderate Assistance - Patient - 50 - 74% Assistive device: No Device    Walk 10 feet on uneven surface  activity   Assist     Assist level: Moderate Assistance - Patient - 50 - 74%     Wheelchair     Assist Is the patient using a wheelchair?: Yes Type of Wheelchair: Manual    Wheelchair assist level: Dependent - Patient 0% Max wheelchair distance: 150'    Wheelchair 50 feet with 2 turns activity    Assist        Assist Level: Dependent -  Patient 0%   Wheelchair 150 feet activity     Assist      Assist Level: Dependent - Patient 0%   Blood pressure 114/81, pulse 62, temperature 98.7 F (37.1 C), temperature source Oral, resp. rate 20, height 5' 4 (1.626 m), weight 65.3 kg, SpO2 98%.  Medical Problem List and Plan: 1. 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  Plan repeat CTA 2-3 months to evaluate for improvement dissection             -patient may shower             -ELOS/Goals: 10-12 days MinA             -Continue CIR   2.  Antithrombotics: -DVT/anticoagulation:  Pharmaceutical: Heparin  x 48 hours done  amb distance not recorded, goal is 150' will ask PT -antiplatelet therapy: continue Aspirin  81 mg daily and Plavix  75 mg daily x 3 months then aspirin  alone   3. Pain Management: continue Oxycodone /Fioricet  as needed, tylenol  PRN   4. Mood/Behavior/Sleep/MDD/ADHD: continue Celexa  20 mg daily.  Provide emotional support.  Psychiatry consulted             -antipsychotic agents: N/A - Patient was seen by psychology 9/11, continue current regimen and call back if further assistance needed.  Psychiatry is signed off -Melatonin PRN   5. Neuropsych/cognition: This patient is not capable of making decisions on her own behalf.  - Patient seen by neuropsychology for depression, appreciate insight   6. Skin/Wound Care: Routine skin checks   7. Fluids/Electrolytes/Nutrition: Routine ins and outs with follow-up chemistries BMET looks good, check Vit D levels   8.  PFO.  TEE showed positive PFO, at rest LTR shunting and with Valsalva RTL shunting.  Follow-up outpatient cardiology service for question PFO closure   9.  Hyperlipidemia: continue Crestor  20 mg daily, checking lipoprotein A    10.  Microcytosis/Thrombocytopenia low normal ferritin.  Placed on iron supplement - 9/11 hemoglobin  stable at 13.1, MCV 77.  Appears chronic.  Thalassemia workup?  Recheck CBC Monday ordered  11.  Transaminitis.  Improved, ALT mildly elevated at 47.  Continue to monitor  Recheck Monday ordered  12. Constipation  -9/12 order milk of mg 30ml -07/16/24 no BM still, started colace 200mg  daily, also taking miralax  BID. If no BM by tomorrow, would try sorbitol .  -07/17/24  no BM still, sorbitol  60ml ordered  LOS: 7 days A FACE TO FACE EVALUATION WAS PERFORMED  Cassidy Bruce 07/20/2024, 7:52 AM

## 2024-07-20 NOTE — Progress Notes (Signed)
 Occupational Therapy Session Note  Patient Details  Name: Cassidy Bruce MRN: [id number] Date of Birth: 07-02-1996  Today's Date: 07/20/2024 OT Individual Time: 1104-1200 OT Individual Time Calculation (min): 56 min    Short Term Goals: Week 1:  OT Short Term Goal 1 (Week 1): Pt will complete U/LB bathing with CGA OT Short Term Goal 2 (Week 1): Pt will maintain L UE in safe position during bed mobility with MIN verbal cues OT Short Term Goal 3 (Week 1): Pt will recall hemi dressing techniques with MIN verbal cues OT Short Term Goal 4 (Week 1): Pt will complete shower transfer with CGA using LRAD  Skilled Therapeutic Interventions/Progress Updates:     Pt received resting in bed presenting with flat affect. Pt receptive to skilled OT session reporting 0/10 pain- OT offering intermittent rest breaks, repositioning, and therapeutic support to optimize participation in therapy session. Focused this session ADL retraining, L UE functional use, activity tolerance, and dynamic balance.   Mobility:  -Bed Mobility: SUPINE > EOB SUP without use of bed features.  -Functional Mobility: Pt completed functional mobility using RW to/from therapy gym for endurance training and to increased overall confidence completing functional mobility. She was able to complete ~157ft in each direction with CGA.  ADL:  -Bathing: U/LB bathing completed seated on TTB with SUP. She was able to utilize L UE at gross assist level for ~50% of task to wash R hemi-body and manage shower head.  -Dressing: Doffed OH shirt and pants while seated on TTB completing lateral leans to bring pants off waist. Following shower, Pt completed UB dressing donning [place] shirt SUP and pants with MIN A to weave feet d/t tight fit of leggings.  -Shower transfer: Ambulatory transfer completed with MIN HHA +increased time- increased ataxia noted when ambulating without RW.   Therapeutic Activity:  -Pt participated in dynamic balance horse shoe  toss activity without AD to work on functional reaching, reactionary balance, sit<>stands, and L UE functional use. Pt initially tasked with doffing horse shoes from large vertical mirror using R UE to remove x4 and L UE to remove x4. She then reversed activity to donn horse shoes on over mirror with MIN dropping noted. Slowed motor movement with increased effort required when using L UE. Pt able to achieve full shoulder flexion during activity. Pt then instructed to toss horse shoes at target using R UE and completing sit<>stands between each throw. She completed total of 3x4 reps with SBA-CGA provided for standing balance.   NuStep:  -Engaged Pt in completing B U/LE endurance training on NuStep to increase activity tolerance and work on reciprocal U/LE movements. Pt able to complete total of 7 min on level 3 setting at [facility] of 55-65. Short seated rest break provided following.   Pt was left resting EOB with call bell in reach, bed alarm on, and all needs met.    Therapy Documentation Precautions:  Precautions Precautions: Fall Recall of Precautions/Restrictions: Intact Restrictions Weight Bearing Restrictions Per Provider Order: No   Therapy/Group: Individual Therapy  Katheryn SHAUNNA [place]ines 07/20/2024, 11:34 AM

## 2024-07-21 LAB — CBC
HCT: 39 % (ref 36.0–46.0)
Hemoglobin: 12.8 g/dL (ref 12.0–15.0)
MCH: 25.7 pg — ABNORMAL LOW (ref 26.0–34.0)
MCHC: 32.8 g/dL (ref 30.0–36.0)
MCV: 78.2 fL — ABNORMAL LOW (ref 80.0–100.0)
Platelets: 143 K/uL — ABNORMAL LOW (ref 150–400)
RBC: 4.99 MIL/uL (ref 3.87–5.11)
RDW: 14.8 % (ref 11.5–15.5)
WBC: 6.7 K/uL (ref 4.0–10.5)
nRBC: 0 % (ref 0.0–0.2)

## 2024-07-21 LAB — BASIC METABOLIC PANEL WITH GFR
Anion gap: 13 (ref 5–15)
BUN: 8 mg/dL (ref 6–20)
CO2: 24 mmol/L (ref 22–32)
Calcium: 8.6 mg/dL — ABNORMAL LOW (ref 8.9–10.3)
Chloride: 103 mmol/L (ref 98–111)
Creatinine, Ser: 0.73 mg/dL (ref 0.44–1.00)
GFR, Estimated: >60 mL/min (ref >60)
Glucose, Bld: 83 mg/dL (ref 70–99)
Potassium: 3.8 mmol/L (ref 3.5–5.1)
Sodium: 140 mmol/L (ref 135–145)

## 2024-07-21 MED ORDER — VITAMIN D (ERGOCALCIFEROL) 1.25 MG (50000 UNIT) PO CAPS
50000.0000 [IU] | ORAL_CAPSULE | ORAL | Status: DC
  Administered 2024-07-21 – 2024-07-28 (×2): 50000 [IU] via ORAL
  Filled 2024-07-21 (×2): qty 1

## 2024-07-21 NOTE — Patient Care Conference (Signed)
 Inpatient RehabilitationTeam Conference and Plan of Care Update Date: 07/20/2024   Time: 10:39 AM   Patient was admitted after the interdisciplinary team meeting took place on 07/13/2024; as a result, the first team conference occurred on day 8 of this patient's stay.     Patient Name: Cassidy Bruce      Medical Record Number: [id number]  Date of Birth: 09-18-1996 Sex: Female         Room/Bed: 4M07C/4M07C-01 Payor Info: Payor: /    Admit Date/Time:  07/13/2024  3:46 PM  Primary Diagnosis:  Thromboembolic stroke [facility])  Hospital Problems: Principal Problem:   Thromboembolic stroke (HCC) Active Problems:   MDD (major depressive disorder), recurrent episode, moderate (HCC)   Severe episode of recurrent major depressive disorder, without psychotic features (HCC)   Panic disorder with agoraphobia and moderate panic attacks    Expected Discharge Date: Expected Discharge Date: 08/03/24  Team Members Present: Physician leading conference: Dr. Prentice Compton Social Worker Present: Rhoda Clement, LCSW Nurse Present: Barnie Ronde, RN PT Present: Recardo Milliner, PT OT Present: Katheryn Mines, OT SLP Present: Recardo Mole, SLP PPS Coordinator present : Eleanor Colon, SLP     Current Status/Progress Goal Weekly Team Focus  Bowel/Bladder   Continent B/B LBM 09/16   Will remain continent with normal pattern   Assist with toilet needs qshift/prn    Swallow/Nutrition/ Hydration   reg/thin   mod i  tolerance of current diet, patient/family education, use of strategies    ADL's   UB dressing SUP, LB dressing CGA, U/LB dressing seated on TTB SUP using grab bars, toilet/shower transfers ambulatory MIN A without AD and CGA with RW   SUP ADLs, IADLs CGA   NMR, ADL retraining, dynamic standing/sitting balance, AD/AE education, therapeutic use of self, Pt education    Mobility   Bed mobility = supervision; STS = supervision, Stand pivot = CGA, Ambulation with RW = CGA with ataxia in trunk and  LLE, ambulation without AD = MinA for balance d/t ataxia, stairs require UE support and completes 12 with CGA.   overall supervision  Barriers: d/c location, ataxia, balance /// Work On: L hemibody NMR for ataxia, standing balance, quality of gait, LOA in transfers    Communication   moderate ataxic dysarthria   supervisionA   increasing use of prosody, speech intelligibility, fluency strategies    Safety/Cognition/ Behavioral Observations  modA   supervisionA   intellectual awareness of cognitive changes, short term memory, problem solving    Pain   Denies pain on this shift   Will be free from pain   Assess pain qshift/prn    Skin   Skin is intact   Will maintain skin intergrity without breakdown  Assess skin for breakdown qshift/prn      Discharge Planning:  Will need to be mod/i does not have a caregiver-roommate may be there or not and one aunt lives in [place] and other here, have not seen. Does not really have a dc plan.   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  Patient on target to meet rehab goals: yes, currently needs supervision for upper body care and CGA for lower body care and completes dressing seated with supervision.  Needs supervision for bed mobility and sit - stand and stand pivot transfers. Able to ambulate with a RW and CGA but needs min assist without an assistive device.  Completes steps with  CGA - min assist. Goals for discharge set for supervision - mod I overall.  *See Care Plan and progress notes for long and short-term goals.   Revisions to Treatment Plan:  Cardiology follow up referral for PFO   Teaching Needs: Safety, medications, transfers, toileting, etc.   Current Barriers to Discharge: Lack of/limited family support, Insurance for SNF coverage, and lack of insurance for follow up services  Possible  Resolutions to Barriers: Schedule appointment with Clinic for establishment of PCP     Medical Summary Current Status: Ataxic speech as well as left upper extremity left lower extremity ataxia, elevated lipoprotein a, poor balance  Barriers to Discharge: Self-care education;Other (comments)  Barriers to Discharge Comments: Elevated lipoprotein a Possible Resolutions to [employer] Focus: limited outpt f/u rehab due to lack of insurance   Continued Need for Acute Rehabilitation Level of Care: The patient requires daily medical management by a physician with specialized training in physical medicine and rehabilitation for the following reasons: Direction of a multidisciplinary physical rehabilitation program to maximize functional independence : Yes Medical management of patient stability for increased activity during participation in an intensive rehabilitation regime.: Yes Analysis of laboratory values and/or radiology reports with any subsequent need for medication adjustment and/or medical intervention. : Yes   I attest that I was present, lead the team conference, and concur with the assessment and plan of the team.   Fredericka Sober B 07/21/2024, 8:58 AM

## 2024-07-21 NOTE — Plan of Care (Signed)
 Problem: RH Balance Goal: LTG Patient will maintain dynamic standing with ADLs (OT) Description: LTG:  Patient will maintain dynamic standing balance with assist during activities of daily living (OT)  Flowsheets (Taken 07/21/2024 1557) LTG: Pt will maintain dynamic standing balance during ADLs with: Independent with assistive device Note: Goal upgraded based on Pt's ELOS, CLOF, and need to be at MOD I level at d/c d/t decreased caregiver support.    Problem: RH Bathing Goal: LTG Patient will bathe all body parts with assist levels (OT) Description: LTG: Patient will bathe all body parts with assist levels (OT) Note: Goal upgraded based on Pt's ELOS, CLOF, and need to be at MOD I level at d/c d/t decreased caregiver support.    Problem: RH Dressing Goal: LTG Patient will perform upper body dressing (OT) Description: LTG Patient will perform upper body dressing with assist, with/without cues (OT). Flowsheets (Taken 07/21/2024 1557) LTG: Pt will perform upper body dressing with assistance level of: Independent with assistive device Note: Goal upgraded based on Pt's ELOS, CLOF, and need to be at MOD I level at d/c d/t decreased caregiver support.  Goal: LTG Patient will perform lower body dressing w/assist (OT) Description: LTG: Patient will perform lower body dressing with assist, with/without cues in positioning using equipment (OT) Flowsheets (Taken 07/21/2024 1557) LTG: Pt will perform lower body dressing with assistance level of: Independent with assistive device Note: Goal upgraded based on Pt's ELOS, CLOF, and need to be at MOD I level at d/c d/t decreased caregiver support.    Problem: RH Toileting Goal: LTG Patient will perform toileting task (3/3 steps) with assistance level (OT) Description: LTG: Patient will perform toileting task (3/3 steps) with assistance level (OT)  Flowsheets (Taken 07/21/2024 1557) LTG: Pt will perform toileting task (3/3 steps) with assistance level:  Independent with assistive device Note: Goal upgraded based on Pt's ELOS, CLOF, and need to be at MOD I level at d/c d/t decreased caregiver support.    Problem: RH Functional Use of Upper Extremity Goal: LTG Patient will use RT/LT upper extremity as a (OT) Description: LTG: Patient will use right/left upper extremity as a stabilizer/gross assist/diminished/nondominant/dominant level with assist, with/without cues during functional activity (OT) Flowsheets Taken 07/21/2024 1557 LTG: Pt will use upper extremity in functional activity with assistance level of: Independent with assistive device Taken 07/14/2024 1244 LTG: Use of upper extremity in functional activities: LUE as diminished level Note: Goal upgraded based on Pt's ELOS, CLOF, and need to be at MOD I level at d/c d/t decreased caregiver support.    Problem: RH Laundry Goal: LTG Patient will perform laundry w/assist, cues (OT) Description: LTG: Patient will perform laundry with assistance, with/without cues (OT). Flowsheets (Taken 07/21/2024 1557) LTG: Pt will perform laundry with assistance level of: Supervision/Verbal cueing Note: Goal upgraded based on Pt's ELOS, CLOF, and need to be at MOD I level at d/c d/t decreased caregiver support.    Problem: RH Toilet Transfers Goal: LTG Patient will perform toilet transfers w/assist (OT) Description: LTG: Patient will perform toilet transfers with assist, with/without cues using equipment (OT) Flowsheets (Taken 07/21/2024 1557) LTG: Pt will perform toilet transfers with assistance level of: Independent with assistive device Note: Goal upgraded based on Pt's ELOS, CLOF, and need to be at MOD I level at d/c d/t decreased caregiver support.    Problem: RH Tub/Shower Transfers Goal: LTG Patient will perform tub/shower transfers w/assist (OT) Description: LTG: Patient will perform tub/shower transfers with assist, with/without cues using equipment (OT) Flowsheets (  Taken 07/21/2024 1557) LTG:  Pt will perform tub/shower stall transfers with assistance level of: Independent with assistive device Note: Goal upgraded based on Pt's ELOS, CLOF, and need to be at MOD I level at d/c d/t decreased caregiver support.

## 2024-07-21 NOTE — Progress Notes (Addendum)
 Speech Language Pathology Daily Session Note  Patient Details  Name: Cassidy Bruce MRN: [id number] Date of Birth: 05/22/1996  Today's Date: 07/21/2024 SLP Individual Time: 1100-1158 SLP Individual Time Calculation (min): 58 min  Short Term Goals: Week 1: SLP Short Term Goal 1 (Week 1): Patient will recall and utilize memory strategies with min multimodal A SLP Short Term Goal 2 (Week 1): Patient will recall cognitive deficits with min multimodal A SLP Short Term Goal 3 (Week 1): Patient will demonstrate mildly complex problem solving skills with min multimodal A SLP Short Term Goal 4 (Week 1): Patient will consume least restrictive diet with use of safe swallowing strategies given supervision verbal A SLP Short Term Goal 5 (Week 1): Patient will utilize dysarthria strategies to increase fluency given min multimodal A  Skilled Therapeutic Interventions:   Pt greeted at bedside for tx tasks targeting communication and cognitive goals. SLP facilitated verbal tasks targeting breath/speech coordination. She completed breathing exercises X2: 3-3-3 breathing and sustained /s/ w/ supervision. Of note, pt able to sustain /s/ for 20 seconds x1. She also completed verbal tasks counting 1-10: increasing volume x3, decreasing volume x3, and alternating volume x3 w/ minA cues. She demonstrated improved control as compared to 9/13 (when this SLP last saw her), but continues to benefit from modA overall given ataxia. She then completed a task requiring spontaneous responses (3 words) and benefited from only minA cues for breath/speech coordination. SLP introduced tapping to assist w/ prosody in multisyllabic words and she benefited from modA to use this during single word responses. Also introduced an oral reading task targeting pacing. She was able to read one syllable words and recite the alphabet while maintaining a rhythm w/ minA cues. She was able to recall events of the morning and SLP tasks independently.  SLP then documented this info in her memory book. At the end of tx tasks, she was left in bed w/ the alarm set and call light within reach. Recommend cont ST per POC.     Pain  No pain reported  Therapy/Group: Individual Therapy  [name] 07/21/2024, 1:27 PM

## 2024-07-21 NOTE — Group Note (Signed)
 Patient Details Name: Fariha Goto MRN: [id number] DOB: May 13, 1996 Today's Date: 07/21/2024  Time Calculation: OT Group Time Calculation OT Group Start Time: 1400 OT Group Stop Time: 1430 OT Group Time Calculation (min): 30 min      Group Description: Dance Group: Pt participated in dance group with an emphasis on social interaction, motor planning, increasing overall activity tolerance and bimanual tasks. All songs were selected by group members. Dance moves included AROM of BUE/BLE gross motor movements with an emphasis on building functional endurance.   Individual level documentation: Patient completed group from sitting level. Patient needed supervision to complete various dance moves with cues for motivation and moral.  Patient needed min modifications during group.  Pain:  0/10  Precautions:  Felton Katheryn SHAUNNA Leavy 07/21/2024, 3:25 PM

## 2024-07-21 NOTE — Progress Notes (Signed)
 Speech Language Pathology Weekly Progress and Session Note  Patient Details  Name: Cassidy Bruce MRN: [id number] Date of Birth: 1996-07-22  Beginning of progress report period: July 14, 2024 End of progress report period: July 21, 2024  Today's Date: 07/21/2024 SLP Individual Time: 1430-1530 SLP Individual Time Calculation (min): 60 min  Short Term Goals: Week 1: SLP Short Term Goal 1 (Week 1): Patient will recall and utilize memory strategies with min multimodal A SLP Short Term Goal 1 - Progress (Week 1): Met SLP Short Term Goal 2 (Week 1): Patient will recall cognitive deficits with min multimodal A SLP Short Term Goal 2 - Progress (Week 1): Met SLP Short Term Goal 3 (Week 1): Patient will demonstrate mildly complex problem solving skills with min multimodal A SLP Short Term Goal 3 - Progress (Week 1): Met SLP Short Term Goal 4 (Week 1): Patient will consume least restrictive diet with use of safe swallowing strategies given supervision verbal A SLP Short Term Goal 4 - Progress (Week 1): Met SLP Short Term Goal 5 (Week 1): Patient will utilize dysarthria strategies to increase fluency given min multimodal A SLP Short Term Goal 5 - Progress (Week 1): Not met    New Short Term Goals: Week 2: SLP Short Term Goal 1 (Week 2): Patient will recall and utilize memory strategies with supervision multimodal A SLP Short Term Goal 2 (Week 2): Patient will consume least restrictive diet with use of safe swallowing strategies given mod i verbal A SLP Short Term Goal 3 (Week 2): Patient will utilize dysarthria strategies to increase fluency given min multimodal A SLP Short Term Goal 4 (Week 2): Patient will demonstrate problem solving in functional tasks given supervision multimodal A  Weekly Progress Updates: Pt has made good gains and has met 4 of 5 STGs this reporting period due to improved cognition and dysphagia. Currently, patient continues to require min A for cognitive tasks  including problem solving, memory and awareness and supervision A for use of swallowing compensatory strategies (NO STRAWS). Patient did not meet dysarthria goals this reporting period due to need for mod-maxA for use of fluency strategies. Pt/family eduction ongoing. Pt would benefit from continued ST intervention to maximize swallowing, speech and cognition in order to maximize functional independence at d/c.    Intensity: Minumum of 1-2 x/day, 30 to 90 minutes Frequency: 3 to 5 out of 7 days Duration/Length of Stay: 10/1 Treatment/Interventions: Cognitive remediation/compensation;Dysphagia/aspiration precaution training;Internal/external aids;Speech/Language facilitation;Cueing hierarchy;Therapeutic Activities;Functional tasks;Multimodal communication approach;Patient/family education   Daily Session  Skilled Therapeutic Interventions: Skilled therapy session focused on dysphagia, cognitive and communication goals. SLP facilitated session by observing patient with water  via cup and regular solids. Patient with timely mastication and complete oral clearance of solids. x1 immediate, strong cough observed via cup edge. Recommend NO STRAWS during thin liquids and continue regular solids. SLP to continue monitioring for need of MBS to assess oropharyngeal swallowing function. SLP targeted cognitive goals through medication management task. Patient completed BID pillbox according to written and verbalized instructions with supervisionA. SLP continued to challenge patient though mistake identification task. Patient identifed mistakes and corrected utilizing minA. At the end of the session, SLP introduced easy onset strategy and provided handout. Patient utilized with vowels given modA to increase fluency. Patient left in bed with alarm set and call bell in reach. Continue POC.      Pain None reported   Therapy/Group: Individual Therapy  Demaris Leavell M.A., CCC-SLP 07/21/2024, 2:53 PM

## 2024-07-21 NOTE — Progress Notes (Signed)
 Occupational Therapy Weekly Progress Note  Patient Details  Name: Cassidy Bruce MRN: [id number] Date of Birth: 1996/01/30  Beginning of progress report period: July 14, 2024 End of progress report period: July 21, 2024  Today's Date: 07/21/2024 OT Individual Time: 9068-8970 OT Individual Time Calculation (min): 58 min    Patient has met 4 of 4 short term goals.  All STGs met due to improvements in L attention, using hemi techniques, balance, and activity tolerance. Pt overall SUP-CGA for ADLs, CGA for toilet/shower transfers, and CGA-MIN A for ambulation/reaching outside of BOS when standing. Pt utilizing LUE at a gross A level and progressing toward diminished level with SUP, intermittent MIN A.   Patient continues to demonstrate the following deficits: muscle weakness, decreased cardiorespiratoy endurance, impaired timing and sequencing, abnormal tone, unbalanced muscle activation, ataxia, decreased coordination, and decreased motor planning, decreased visual motor skills, decreased attention to left and decreased motor planning, decreased initiation, decreased attention, decreased awareness, and decreased safety awareness, central origin, and decreased standing balance, decreased postural control, decreased balance strategies, and hemiparesis and therefore will continue to benefit from skilled OT intervention to enhance overall performance with BADL, iADL, and Reduce care partner burden.  Patient progressing toward long term goals..  Plan of care revisions: Upgrading goals d/t CLOF and ELOS d/t decreased family support at d/c.  OT Short Term Goals Week 1:  OT Short Term Goal 1 (Week 1): Pt will complete U/LB bathing with CGA OT Short Term Goal 1 - Progress (Week 1): Met OT Short Term Goal 2 (Week 1): Pt will maintain L UE in safe position during bed mobility with MIN verbal cues OT Short Term Goal 2 - Progress (Week 1): Met OT Short Term Goal 3 (Week 1): Pt will recall hemi  dressing techniques with MIN verbal cues OT Short Term Goal 3 - Progress (Week 1): Met OT Short Term Goal 4 (Week 1): Pt will complete shower transfer with CGA using LRAD OT Short Term Goal 4 - Progress (Week 1): Met Week 2:  OT Short Term Goal 1 (Week 2): Pt will compelte laundry while using L hand 50% of the time OT Short Term Goal 2 (Week 2): Pt will complete laundry while maintaining standing balance with SUP and LRAD OT Short Term Goal 3 (Week 2): Pt will complete grooming tasks in standing position with SUP and LRAD  Skilled Therapeutic Interventions/Progress Updates:    Pt received resting in recliner presenting to be in good spirits receptive to skilled OT session reporting 0/10 pain- OT offering intermittent rest breaks, repositioning, and therapeutic support to optimize participation in therapy session. Upon OT arrival, Pt requested taking a shower. Pt ambulated to shower with CGA. Doffed shirt and socks while sitting with SUP and pushed pants down over hips in standing with CGA for safety. Pt completed shower while seated on TTB with SUP for safety. Pt ambulated approximately 10 ft to EOB. Pt completed U/LB dressing while seated EOB with SUP and CGA for pulling underwear and pants over hips while standing for safety and balance. Pt ambulated approximately 75 ft to rehab gym with CGA for balance and safety. Pt completed cone tapping exercise with one foot at a time to work on standing dynamic balance while crossing midline with 4# ankle weights 10 reps on each side. Pt demonstrated difficulty at first with LOB requiring MIN A for balance. As activity progressed, she demonstrated increased proprioception and body awareness requiring CGA. Pt completed LUE targeted reaching and grip strengthening, standing  balance with changing BOS to pull squigz off mirror with L UE then reaching across midline to place them into container 12 reps with MIN dropping and intermittent use of compensatory grasp. Pt with  ataxia in LUE demonstrated by cogwheel movements when reach, however completed targeted reach activity with increased time and effort. Ambulated approximately 75 ft back to room with CGA for balance and safety. Noticeable improved mood as evidence of smiling and laughing during session. Pt was left resting in recliner with call bell in reach, seatbelt alarm on, and all needs met.    Therapy Documentation Precautions:  Precautions Precautions: Fall Recall of Precautions/Restrictions: Intact Restrictions Weight Bearing Restrictions Per Provider Order: No  Therapy/Group: Individual Therapy  Maki Hege Van Schaick 07/21/2024, 9:45 AM

## 2024-07-21 NOTE — Progress Notes (Signed)
 Physical Therapy Session Note  Patient Details  Name: Cassidy Bruce MRN: [id number] Date of Birth: Sep 05, 1996  Today's Date: 07/21/2024 PT Individual Time: 9169-9084 PT Individual Time Calculation (min): 45 min   Short Term Goals: Week 1:  PT Short Term Goal 1 (Week 1): Pt will perform sit to stand with CGA consistently. PT Short Term Goal 2 (Week 1): Pt will perform bed to chair with CGA consistently. PT Short Term Goal 3 (Week 1): Pt will ambulate x150' with CGA and LRAD. PT Short Term Goal 4 (Week 1): Pt will complete balance assessment.  Skilled Therapeutic Interventions/Progress Updates:    Pt presents in bed but easily awaken and agreeable to therapy. Bed mobility independent from flat bed. Supervision for sit > stand for safety and CGA for ambulatory transfers with or without device due to ataxia/balance. Session focused on NMR to address ataxia, LUE/LLE weakness and control, dynamic balance, and transitional movements. Pt performed gait to therapy gym with RW with CGA with focus on L knee control and upright posture/balance, narrow BOS noted. Blocked practice toe taps to 6 step with RLE to focus on L knee control in stance and increased functional strengthening for carryover to gait. Ball toss in standing with focus on BUE coordination with emphasis on pt using LUE to guide movements and progressed to more open chain and dynamic by also side stepping - had to break down steps as could not fully dual task. Completed several bouts to R and L. Quadruped for further NMR in closed chain activity and then open chain with LUE WB while RUE reaching for squigz on mirror and then open chain for LUE to take items off of mirror with focus on targeted grasp and release. Some rest breaks on heels due to fatigue. Gait x 75' without device with min to light mod A for facilitation of weightshift, noted decreased BOS, decreased step length, ataxia, and preference to hold onto something - walked rest of way  with RW with CGA. Set up in recliner with all needs in reach.   Therapy Documentation Precautions:  Precautions Precautions: Fall Recall of Precautions/Restrictions: Intact Restrictions Weight Bearing Restrictions Per Provider Order: No  Pain: Denies pain.    Therapy/Group: Individual Therapy  Elnor Pizza Sherrell Pizza WENDI Elnor, PT, DPT, CBIS  07/21/2024, 9:29 AM

## 2024-07-21 NOTE — Progress Notes (Signed)
 PROGRESS NOTE   Subjective/Complaints:  Discussed mechanism of stroke , blood vessel healing and brain healing DIscussed increased dose of rosuvastatin  recommended by neuro given LDL and Lipoprotein A elevations Discussed need for Vit D supplementation for deficiency   ROS: as per HPI. Denies CP, SOB, abd pain, N/V/D, or any other complaints at this time.    Objective:   No results found. Recent Labs    07/21/24 0620  WBC 6.7  HGB 12.8  HCT 39.0  PLT 143*    No results for input(s): NA, K, CL, CO2, GLUCOSE, BUN, CREATININE, CALCIUM  in the last 72 hours.    Intake/Output Summary (Last 24 hours) at 07/21/2024 0724 Last data filed at 07/20/2024 1854 Gross per 24 hour  Intake 340 ml  Output --  Net 340 ml        Physical Exam: Vital Signs Blood pressure (!) 102/58, pulse (!) 48, temperature 98.7 F (37.1 C), resp. rate 16, height 5' 4 (1.626 m), weight 65.3 kg, SpO2 98%.   General: No acute distress Mood and affect are appropriate Heart: Regular rate and rhythm no rubs murmurs or extra sounds Lungs: Clear to auscultation, breathing unlabored, no rales or wheezes Abdomen: Positive bowel sounds, soft nontender to palpation, nondistended Extremities: No clubbing, cyanosis, or edema Skin: No evidence of breakdown, no evidence of rash  PRIOR EXAMS: Neuro:  Patient is alert.  Sitting up in bed. .  Ataxic dysarthria.  Right upper extremity and right lower extremity 5 out of 5 strength, left upper extremity 4 out of 5 proximal and distal, left lower extremity 4+ out of 5, sensation is intact in all 4 extremities moderate dysmetria LUE FNF and LLE HS Reduced fine motor LUE with inability to oppose finger to thumb    Assessment/Plan: 1. Functional deficits which require 3+ hours per day of interdisciplinary therapy in a comprehensive inpatient rehab setting. Physiatrist is providing close team  supervision and 24 hour management of active medical problems listed below. Physiatrist and rehab team continue to assess barriers to discharge/monitor patient progress toward functional and medical goals  Care Tool:  Bathing    Body parts bathed by patient: Right arm, Left arm, Chest, Abdomen, Front perineal area, Right upper leg, Left upper leg, Right lower leg, Left lower leg, Face, Buttocks   Body parts bathed by helper: Buttocks, Right arm     Bathing assist Assist Level: Supervision/Verbal cueing     Upper Body Dressing/Undressing Upper body dressing   What is the patient wearing?: Pull over shirt    Upper body assist Assist Level: Supervision/Verbal cueing    Lower Body Dressing/Undressing Lower body dressing      What is the patient wearing?: Pants     Lower body assist Assist for lower body dressing: Minimal Assistance - Patient > 75%     Toileting Toileting    Toileting assist Assist for toileting: Minimal Assistance - Patient > 75%     Transfers Chair/bed transfer  Transfers assist     Chair/bed transfer assist level: Moderate Assistance - Patient 50 - 74%     Locomotion Ambulation   Ambulation assist      Assist level: Moderate  Assistance - Patient 50 - 74% Assistive device: No Device Max distance: 150'   Walk 10 feet activity   Assist     Assist level: Moderate Assistance - Patient - 50 - 74% Assistive device: No Device   Walk 50 feet activity   Assist    Assist level: Moderate Assistance - Patient - 50 - 74% Assistive device: No Device    Walk 150 feet activity   Assist    Assist level: Moderate Assistance - Patient - 50 - 74% Assistive device: No Device    Walk 10 feet on uneven surface  activity   Assist     Assist level: Moderate Assistance - Patient - 50 - 74%     Wheelchair     Assist Is the patient using a wheelchair?: Yes Type of Wheelchair: Manual    Wheelchair assist level: Dependent -  Patient 0% Max wheelchair distance: 150'    Wheelchair 50 feet with 2 turns activity    Assist        Assist Level: Dependent - Patient 0%   Wheelchair 150 feet activity     Assist      Assist Level: Dependent - Patient 0%   Blood pressure (!) 102/58, pulse (!) 48, temperature 98.7 F (37.1 C), resp. rate 16, height 5' 4 (1.626 m), weight 65.3 kg, SpO2 98%.  Medical Problem List and Plan: 1. 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  Plan repeat CTA 2-3 months to evaluate for improvement dissection             -patient may shower             -ELOS/Goals: 10-12 days MinA             -Continue CIR   2.  Antithrombotics: -DVT/anticoagulation:  Pharmaceutical: Heparin  x 48 hours done  amb distance not recorded, goal is 150' will ask PT -antiplatelet therapy: continue Aspirin  81 mg daily and Plavix  75 mg daily x 3 months then aspirin  alone   3. Pain Management: continue Oxycodone /Fioricet  as needed, tylenol  PRN   4. Mood/Behavior/Sleep/MDD/ADHD: continue Celexa  20 mg daily.  Provide emotional support.  Psychiatry consulted             -antipsychotic agents: N/A - Patient was seen by psychology 9/11, continue current regimen and call back if further assistance needed.  Psychiatry is signed off -Melatonin PRN   5. Neuropsych/cognition: This patient is not capable of making decisions on her own behalf.  - Patient seen by neuropsychology for depression, appreciate insight   6. Skin/Wound Care: Routine skin checks   7. Fluids/Electrolytes/Nutrition: Routine ins and outs with follow-up chemistries BMET looks good, check Vit D levels   8.  PFO.  TEE showed positive PFO, at rest LTR shunting and with Valsalva RTL shunting.  Follow-up outpatient cardiology service for question PFO closure   9.  Hyperlipidemia: per neuro increase Crestor  40 mg  daily, also has elevated lipoprotein A    10.  Microcytosis/Thrombocytopenia low normal ferritin.  Placed on iron supplement - 9/11 hemoglobin stable at 13.1, MCV 77.  Appears chronic.  Thalassemia workup?  Recheck CBC Monday ordered  11.  Transaminitis.  Improved, ALT mildly elevated at 47.  Continue to monitor  Recheck Monday ordered  12. Constipation  -9/12 order milk of mg 30ml -07/16/24 no BM still, started colace 200mg  daily, also taking miralax  BID. If no  BM by tomorrow, would try sorbitol .  -07/17/24 no BM still, sorbitol  60ml ordered 13.  Vitamin D  def- supplement 50, 000 U weekly x 8, rec outside for PT, OT, daily  LOS: 8 days A FACE TO FACE EVALUATION WAS PERFORMED  Prentice FORBES Compton 07/21/2024, 7:24 AM

## 2024-07-21 NOTE — Plan of Care (Signed)
  Problem: Consults Goal: RH STROKE PATIENT EDUCATION Description: See Patient Education module for education specifics  Outcome: Progressing   Problem: RH BOWEL ELIMINATION Goal: RH STG MANAGE BOWEL WITH ASSISTANCE Description: STG Manage Bowel with supervision Assistance. Outcome: Progressing   Problem: RH BLADDER ELIMINATION Goal: RH STG MANAGE BLADDER WITH ASSISTANCE Description: STG Manage Bladder With supervision Assistance Outcome: Progressing   Problem: RH SKIN INTEGRITY Goal: RH STG SKIN FREE OF INFECTION/BREAKDOWN Description: Manage skin free of infection with supervision assistance Outcome: Progressing   Problem: RH SAFETY Goal: RH STG ADHERE TO SAFETY PRECAUTIONS W/ASSISTANCE/DEVICE Description: STG Adhere to Safety Precautions With Assistance/Device. Outcome: Progressing   Problem: RH PAIN MANAGEMENT Goal: RH STG PAIN MANAGED AT OR BELOW PT'S PAIN GOAL Description: <4 w/ prns Outcome: Progressing   Problem: RH KNOWLEDGE DEFICIT Goal: RH STG INCREASE KNOWLEGDE OF HYPERLIPIDEMIA Description: Manage increase knowledge of hyperlipidemia with supervision assistance from friend/ aunt using educational materials provided Outcome: Progressing Goal: RH STG INCREASE KNOWLEDGE OF STROKE PROPHYLAXIS Description: Manage increase knowledge of stroke prophylaxis with supervision assistance from friend/ aunt using educational materials provided Outcome: Progressing

## 2024-07-22 NOTE — Progress Notes (Signed)
 Occupational Therapy Session Note  Patient Details  Name: Cassidy Bruce MRN: [id number] Date of Birth: November 29, 1995  Today's Date: 07/22/2024 OT Individual Time: 9264-9185 OT Individual Time Calculation (min): 39 min    Short Term Goals: Week 2:  OT Short Term Goal 1 (Week 2): Pt will compelte laundry while using L hand 50% of the time OT Short Term Goal 2 (Week 2): Pt will complete laundry while maintaining standing balance with SUP and LRAD OT Short Term Goal 3 (Week 2): Pt will complete grooming tasks in standing position with SUP and LRAD  Skilled Therapeutic Interventions/Progress Updates:   Pt received deeply sleeping in bed presenting to be in good spirits receptive to skilled OT session reporting 0/10 pain- OT offering intermittent rest breaks, repositioning, and therapeutic support to optimize participation in therapy session. MAX effort and increased time provided to wake up and come EOB. MD in/out completing morning rounding. Pt requested to take shower this morning. Pt ambulated approximately 5 ft to/from closet to pick out clothes for the day and put them on bed with CGA for balance and safety. Ambulated approximately 10 ft to shower with CGA for balance and safety. Doffed clothing with SUP for safety while seated on TTB and close SBA when pulling pants down over hips. Pt required MIN A to don shower cap. Pt completed shower with SUP for safety while seated on TTB for energy conservation. Pt appropriately using LUE during shower to wash R side of body and stabilize HH shower head with noticeable ataxia however still able to use appropriately. Ambulated approximately 10 ft to EOB with CGA for safety. Pt completed U/LB dressing while seated EOB for safety and energy conservation with SUP while sitting and close SBA while standing to pull underwear and pants over hips. Ambulated approximately 5 ft to sink with close SUP with noticeable intermittent scissoring, however to correct and maintain  balance. Pt completed oral care with SUP while standing at the sink with no LOB. Ambulated approximately 5 ft back to bed with close SUP with no noticeable scissoring and no LOB. Pt was left resting in bed with call bell in reach, bed alarm on, and all needs met.     Therapy Documentation Precautions:  Precautions Precautions: Fall Recall of Precautions/Restrictions: Intact Restrictions Weight Bearing Restrictions Per Provider Order: No  Therapy/Group: Individual Therapy  Cassidy Bruce 07/22/2024, 7:41 AM

## 2024-07-22 NOTE — Plan of Care (Signed)
  Problem: Consults Goal: RH STROKE PATIENT EDUCATION Description: See Patient Education module for education specifics  Outcome: Progressing   Problem: RH BOWEL ELIMINATION Goal: RH STG MANAGE BOWEL WITH ASSISTANCE Description: STG Manage Bowel with supervision Assistance. Outcome: Progressing   Problem: RH BLADDER ELIMINATION Goal: RH STG MANAGE BLADDER WITH ASSISTANCE Description: STG Manage Bladder With supervision Assistance Outcome: Progressing   Problem: RH SKIN INTEGRITY Goal: RH STG SKIN FREE OF INFECTION/BREAKDOWN Description: Manage skin free of infection with supervision assistance Outcome: Progressing   Problem: RH SAFETY Goal: RH STG ADHERE TO SAFETY PRECAUTIONS W/ASSISTANCE/DEVICE Description: STG Adhere to Safety Precautions With Assistance/Device. Outcome: Progressing

## 2024-07-22 NOTE — Progress Notes (Signed)
 Patient ID: Cassidy Bruce, female   DOB: 09/20/1996, 27 y.o.   MRN: [id number]  Aunt Claudilla asked if can go SNF so she can continue to work on housing for her. Discussed with her pt is uninsured, young and high level and facilities would not offer a bed for her. Aunt will continue working on housing for pt. She is aware she will need supervision at discharge so really can't be alone in an apartment

## 2024-07-22 NOTE — Progress Notes (Signed)
 PROGRESS NOTE   Subjective/Complaints:  Has phototherapy unit in room.  No new issues  ROS: as per HPI. Denies CP, SOB, abd pain, N/V/D, or any other complaints at this time.    Objective:   No results found. Recent Labs    07/21/24 0620  WBC 6.7  HGB 12.8  HCT 39.0  PLT 143*    Recent Labs    07/21/24 0620  NA 140  K 3.8  CL 103  CO2 24  GLUCOSE 83  BUN 8  CREATININE 0.73  CALCIUM  8.6*      Intake/Output Summary (Last 24 hours) at 07/22/2024 0738 Last data filed at 07/21/2024 1200 Gross per 24 hour  Intake 218 ml  Output --  Net 218 ml        Physical Exam: Vital Signs Blood pressure 105/71, pulse (!) 59, temperature 98.4 F (36.9 C), temperature source Oral, resp. rate 16, height 5' 4 (1.626 m), weight 65.3 kg, SpO2 99%.   General: No acute distress Mood and affect are appropriate Heart: Regular rate and rhythm no rubs murmurs or extra sounds Lungs: Clear to auscultation, breathing unlabored, no rales or wheezes Abdomen: Positive bowel sounds, soft nontender to palpation, nondistended Extremities: No clubbing, cyanosis, or edema Skin: No evidence of breakdown, no evidence of rash  Neuro:  Patient is alert.  Sitting up in bed. .  Ataxic dysarthria.  Right upper extremity and right lower extremity 5 out of 5 strength, left upper extremity 4 out of 5 proximal and distal, left lower extremity 4+ out of 5, sensation is intact in all 4 extremities moderate dysmetria LUE FNF and LLE HS Reduced fine motor LUE with inability to oppose finger to thumb    Assessment/Plan: 1. Functional deficits which require 3+ hours per day of interdisciplinary therapy in a comprehensive inpatient rehab setting. Physiatrist is providing close team supervision and 24 hour management of active medical problems listed below. Physiatrist and rehab team continue to assess barriers to discharge/monitor patient progress  toward functional and medical goals  Care Tool:  Bathing    Body parts bathed by patient: Right arm, Left arm, Chest, Abdomen, Front perineal area, Right upper leg, Left upper leg, Right lower leg, Left lower leg, Face, Buttocks   Body parts bathed by helper: Buttocks, Right arm     Bathing assist Assist Level: Supervision/Verbal cueing     Upper Body Dressing/Undressing Upper body dressing   What is the patient wearing?: Pull over shirt    Upper body assist Assist Level: Supervision/Verbal cueing    Lower Body Dressing/Undressing Lower body dressing      What is the patient wearing?: Underwear/pull up, Pants     Lower body assist Assist for lower body dressing: Supervision/Verbal cueing     Toileting Toileting    Toileting assist Assist for toileting: Minimal Assistance - Patient > 75%     Transfers Chair/bed transfer  Transfers assist     Chair/bed transfer assist level: Contact Guard/Touching assist     Locomotion Ambulation   Ambulation assist      Assist level: Contact Guard/Touching assist Assistive device: Walker-rolling Max distance: 150'   Walk 10 feet activity  Assist     Assist level: Contact Guard/Touching assist Assistive device: Walker-rolling   Walk 50 feet activity   Assist    Assist level: Contact Guard/Touching assist Assistive device: Walker-rolling    Walk 150 feet activity   Assist    Assist level: Contact Guard/Touching assist Assistive device: Walker-rolling    Walk 10 feet on uneven surface  activity   Assist     Assist level: Moderate Assistance - Patient - 50 - 74%     Wheelchair     Assist Is the patient using a wheelchair?: Yes Type of Wheelchair: Manual    Wheelchair assist level: Dependent - Patient 0% Max wheelchair distance: 150'    Wheelchair 50 feet with 2 turns activity    Assist        Assist Level: Dependent - Patient 0%   Wheelchair 150 feet activity      Assist      Assist Level: Dependent - Patient 0%   Blood pressure 105/71, pulse (!) 59, temperature 98.4 F (36.9 C), temperature source Oral, resp. rate 16, height 5' 4 (1.626 m), weight 65.3 kg, SpO2 99%.  Medical Problem List and Plan: 1. 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  Plan repeat CTA 2-3 months to evaluate for improvement dissection             -patient may shower             -ELOS/Goals: 08-03-24  MinA             -Continue CIR   2.  Antithrombotics: -DVT/anticoagulation:  Pharmaceutical: Heparin  x 48 hours done  amb distance not recorded, goal is 150' will ask PT -antiplatelet therapy: continue Aspirin  81 mg daily and Plavix  75 mg daily x 3 months then aspirin  alone   3. Pain Management: continue Oxycodone /Fioricet  as needed, tylenol  PRN   4. Mood/Behavior/Sleep/MDD/ADHD: continue Celexa  20 mg daily.  Provide emotional support.  Psychiatry consulted             -antipsychotic agents: N/A - Patient was seen by psychology 9/11, continue current regimen and call back if further assistance needed.  Psychiatry is signed off -Melatonin PRN   5. Neuropsych/cognition: This patient is not capable of making decisions on her own behalf.  - Patient seen by neuropsychology for depression, appreciate insight   6. Skin/Wound Care: Routine skin checks   7. Fluids/Electrolytes/Nutrition: Routine ins and outs with follow-up chemistries BMET looks good, check Vit D levels   8.  PFO.  TEE showed positive PFO, at rest LTR shunting and with Valsalva RTL shunting.  Follow-up outpatient cardiology service for question PFO closure   9.  Hyperlipidemia: per neuro increase Crestor  40 mg daily, also has elevated lipoprotein A    10.  Microcytosis/Thrombocytopenia low normal ferritin.  Placed on iron supplement Hgb nl but plt still on low side, no  evidence of bleeding     Latest Ref Rng & Units 07/21/2024    6:20 AM 07/18/2024    5:26 AM 07/14/2024    5:03 AM  CBC  WBC 4.0 - 10.5 K/uL 6.7  6.7  5.8   Hemoglobin 12.0 - 15.0 g/dL 87.1  86.8  86.8   Hematocrit 36.0 - 46.0 % 39.0  39.5  39.1   Platelets 150 - 400 K/uL 143  156  131     11.  Transaminitis.  Improved, ALT mildly  elevated at 47.  Continue to monitor  Recheck Monday ordered  12. Constipation  -9/12 order milk of mg 30ml -07/16/24 no BM still, started colace 200mg  daily, also taking miralax  9/18 improved , cont BM yesterday  13.  Vitamin D  def- supplement 50, 000 U weekly x 8, rec outside for PT, OT, daily  Has phototherapy UV unit , which can generate 10-25K units of Vit D per day  LOS: 9 days A FACE TO FACE EVALUATION WAS PERFORMED  Cassidy Bruce 07/22/2024, 7:38 AM

## 2024-07-22 NOTE — Progress Notes (Signed)
 Physical Therapy Session Note  Patient Details  Name: Cassidy Bruce MRN: [id number] Date of Birth: [birth year]  Today's Date: 07/22/2024 PT Individual Time: 1030-1105 PT Individual Time Calculation (min): 35 min   Short Term Goals: Week 1:  PT Short Term Goal 1 (Week 1): Pt will perform sit to stand with CGA consistently. PT Short Term Goal 2 (Week 1): Pt will perform bed to chair with CGA consistently. PT Short Term Goal 3 (Week 1): Pt will ambulate x150' with CGA and LRAD. PT Short Term Goal 4 (Week 1): Pt will complete balance assessment.  Skilled Therapeutic Interventions/Progress Updates:  Patient prone in bed and asleep on entrance to room. Easily roused and requires short time to become fully alert. Agreeable to PT session.   Patient with no pain complaint at start of session.  D/t new orders to spend time outside in sun, pt transported dependently via w/c to patio outside of [facility].   Therapeutic Activity/ Gait Training:  Pt ambulated 183' x1 without AD over uneven concrete patio with overall CGA/ MinA. After seated rest break, pt then ambulated 427' x1 with no AD and up to MinA.  Throughout, she demos continued ataxia with significant improvement in LLE step placement, coordination and balance overall. Minimal errant trunk movements. Several instances of increased trunk movements toward limits of balance but is able to correct with increased effort and time. Takes brief moment to center self in balance prior to restarting movements. Provided min vc/ tc for LLE step lengths and foot placement.  Patient prone in bed at end of session with brakes locked, bed alarm set, and all needs within reach.   Therapy Documentation Precautions:  Precautions Precautions: Fall Recall of Precautions/Restrictions: Intact Restrictions Weight Bearing Restrictions Per Provider Order: No  Pain:  No pain related this session.    Therapy/Group: Individual Therapy  Mliss DELENA Milliner PT, DPT,  CSRS 07/22/2024, 10:33 AM

## 2024-07-22 NOTE — Progress Notes (Signed)
 Speech Language Pathology Daily Session Note  Patient Details  Name: Cassidy Bruce MRN: [id number] Date of Birth: November 23, 1995  Today's Date: 07/22/2024 SLP Individual Time: 1400-1500 SLP Individual Time Calculation (min): 60 min  Short Term Goals: Week 2: SLP Short Term Goal 1 (Week 2): Patient will recall and utilize memory strategies with supervision multimodal A SLP Short Term Goal 2 (Week 2): Patient will consume least restrictive diet with use of safe swallowing strategies given mod i verbal A SLP Short Term Goal 3 (Week 2): Patient will utilize dysarthria strategies to increase fluency given min multimodal A SLP Short Term Goal 4 (Week 2): Patient will demonstrate problem solving in functional tasks given supervision multimodal A  Skilled Therapeutic Interventions: SLP conducted skilled therapy session targeting swallowing and communication goals. SLP began with assessment of current diet tolerance. Patient consumed Dys3 solids (preferred snack in room) and thin liquids with subtle delayed throat clear x1, no other overt s/sx of penetration/aspiration occurred. Recommend continuation of current regular/thin liquid diet. SLP then moved to facilitated of voice and breath consistency exercises in setting of moderate ataxic dysarthria. Patient began with box breathing, focusing on pacing inhalation across entire count during 3-3-3 breathing cycle. Patient completed this exercise with supervision, thus SLP increased difficulty to 4-4-4 breathing cycle. During this modified task, patient required mod cues to pace inhalation across entire first 4 counts rather than breathing in rapidly and holding. Patient then completed prolonged /s/ and /z/ hold, achieving smooth and consistent breath and voicing with supervision. SLP increased difficulty by adding various vowels in /z/ production, requesting patient utilize concepts of easy onset previously produced. When incorporating vowels, patient required max  cues to maintain smooth and even breath flow. She also completed verbal tasks counting 1-10: increasing volume x3, decreasing volume x3, and alternating volume x3 w/ minA cues. During final task, SLP facilitated generative naming activity where patient was challenged with utilizing multisyllabic tapping strategy for word of increasing complexity. Patient utilized technique to achieve improved speech pacing with supervision-min assist. Patient was left in room with call bell in reach and alarm set. SLP will continue to target goals per plan of care.       Pain  None  Therapy/Group: Individual Therapy  Daelen Belvedere, M.A., CCC-SLP  Huan Pollok A Shafer Swamy 07/22/2024, 3:01 PM

## 2024-07-22 NOTE — Plan of Care (Signed)
  Problem: Consults Goal: RH STROKE PATIENT EDUCATION Description: See Patient Education module for education specifics  Outcome: Progressing   Problem: RH SKIN INTEGRITY Goal: RH STG SKIN FREE OF INFECTION/BREAKDOWN Description: Manage skin free of infection with supervision assistance Outcome: Progressing

## 2024-07-22 NOTE — Group Note (Signed)
 Patient Details Name: Fannie Gathright MRN: [id number] DOB: October 06, 1996 Today's Date: 07/22/2024  Time Calculation: OT Group Time Calculation OT Group Start Time: 9097 OT Group Stop Time: 1002 OT Group Time Calculation (min): 60 min      Group Description: Fine motor: Pt participated in group session with a focus on LUE FMC, functional reach with LUE, problem solving, visual attention, and social interaction. Pt actively participated in group game of  where pts were instructed on rules of game. Pt using LUE as gross assist to engage in activity.  Pt utilized below compensatory methods to engage in task: hand over hand assist, increased proixmal support, card rack,  and increased time. Pt even engaged in shuffling task to increase bilateral coordination.    Pain: Pain Assessment Pain Scale: 0-10 Pain Score: 0-No pain    Ronal Mallie Needy 07/22/2024, 12:18 PM

## 2024-07-23 NOTE — Plan of Care (Signed)
  Problem: Consults Goal: RH STROKE PATIENT EDUCATION Description: See Patient Education module for education specifics  Outcome: Progressing   Problem: RH BOWEL ELIMINATION Goal: RH STG MANAGE BOWEL WITH ASSISTANCE Description: STG Manage Bowel with supervision Assistance. Outcome: Progressing   Problem: RH BLADDER ELIMINATION Goal: RH STG MANAGE BLADDER WITH ASSISTANCE Description: STG Manage Bladder With supervision Assistance Outcome: Progressing   Problem: RH SKIN INTEGRITY Goal: RH STG SKIN FREE OF INFECTION/BREAKDOWN Description: Manage skin free of infection with supervision assistance Outcome: Progressing   Problem: RH SAFETY Goal: RH STG ADHERE TO SAFETY PRECAUTIONS W/ASSISTANCE/DEVICE Description: STG Adhere to Safety Precautions With Assistance/Device. Outcome: Progressing   Problem: RH PAIN MANAGEMENT Goal: RH STG PAIN MANAGED AT OR BELOW PT'S PAIN GOAL Description: <4 w/ prns Outcome: Progressing   Problem: RH KNOWLEDGE DEFICIT Goal: RH STG INCREASE KNOWLEGDE OF HYPERLIPIDEMIA Description: Manage increase knowledge of hyperlipidemia with supervision assistance from friend/ aunt using educational materials provided Outcome: Progressing Goal: RH STG INCREASE KNOWLEDGE OF STROKE PROPHYLAXIS Description: Manage increase knowledge of stroke prophylaxis with supervision assistance from friend/ aunt using educational materials provided Outcome: Progressing

## 2024-07-23 NOTE — Progress Notes (Signed)
 Occupational Therapy Session Note  Patient Details  Name: Cassidy Bruce MRN: [id number] Date of Birth: 07-06-1996  Today's Date: 07/23/2024 OT Individual Time: 0100-0145 OT Individual Time Calculation (min): 45 min    Short Term Goals: Week 1:  OT Short Term Goal 1 (Week 1): Pt will complete U/LB bathing with CGA OT Short Term Goal 1 - Progress (Week 1): Met OT Short Term Goal 2 (Week 1): Pt will maintain L UE in safe position during bed mobility with MIN verbal cues OT Short Term Goal 2 - Progress (Week 1): Met OT Short Term Goal 3 (Week 1): Pt will recall hemi dressing techniques with MIN verbal cues OT Short Term Goal 3 - Progress (Week 1): Met OT Short Term Goal 4 (Week 1): Pt will complete shower transfer with CGA using LRAD OT Short Term Goal 4 - Progress (Week 1): Met  Skilled Therapeutic Interventions/Progress Updates:    Patient seated EOB talking on the phone with family. The pt reported not having pain and resting okay during the night. The pt was able to verbalize her understanding of occupational therapy and her current diagnosis.  The pt was in agreement with completing UB exercise, sit to stand, and simulated task in LB dressing with some patient education regarding her diagnosis. The pt was able to come from EOB to standing using the RW on occasion with MinA 3x . The pt was able to compete a simulated task in LB dressing using theraband 3x following demonstration and initial cues .  The was able to complete UB exercise using a 3lb dumb bell  2 sets of 13 for bicep curls, shld flexion, horizontal abduction, and elbow extension with rest breaks as needed, the pt required 3 rest breaks. The pt was instructed in the importance of  relaxation breathing to improve compliance during functional task performance and to assist as a coping measure. At the end of the session, the pt remained EOB with all additional needs addressed prior to me exiting the room.   Therapy  Documentation Precautions:  Precautions Precautions: Fall Recall of Precautions/Restrictions: Intact Restrictions Weight Bearing Restrictions Per Provider Order: No  Therapy/Group: Individual Therapy  [name] 07/23/2024, 5:13 PM

## 2024-07-23 NOTE — Progress Notes (Signed)
 PROGRESS NOTE   Subjective/Complaints:  Pt doing well, slept well, denies pain. LBM yesterday per pt but none documented since 9/16-- though poorly documented. Urinating fine. No other complaints or concerns.   ROS: as per HPI. Denies CP, SOB, abd pain, N/V/D, or any other complaints at this time.    Objective:   No results found. Recent Labs    07/21/24 0620  WBC 6.7  HGB 12.8  HCT 39.0  PLT 143*    Recent Labs    07/21/24 0620  NA 140  K 3.8  CL 103  CO2 24  GLUCOSE 83  BUN 8  CREATININE 0.73  CALCIUM  8.6*     No intake or output data in the 24 hours ending 07/23/24 1105       Physical Exam: Vital Signs Blood pressure 109/66, pulse (!) 54, temperature 98.3 F (36.8 C), temperature source Oral, resp. rate 16, height 5' 4 (1.626 m), weight 65.3 kg, SpO2 99%.   General: No acute distress, resting in bed watching tablet.  Mood and affect are appropriate Heart: bradycardic. Regular rhythm no rubs murmurs or extra sounds Lungs: Clear to auscultation, breathing unlabored, no rales or wheezes Abdomen: Positive bowel sounds, soft nontender to palpation, nondistended Extremities: No clubbing, cyanosis, or edema Skin: No evidence of breakdown, no evidence of rash over exposed surfaces.  Neuro:  Patient is alert.  Resting in bed.  Ataxic dysarthria.    PRIOR EXAMS: Neuro: Right upper extremity and right lower extremity 5 out of 5 strength, left upper extremity 4 out of 5 proximal and distal, left lower extremity 4+ out of 5, sensation is intact in all 4 extremities moderate dysmetria LUE FNF and LLE HS Reduced fine motor LUE with inability to oppose finger to thumb    Assessment/Plan: 1. Functional deficits which require 3+ hours per day of interdisciplinary therapy in a comprehensive inpatient rehab setting. Physiatrist is providing close team supervision and 24 hour management of active medical problems  listed below. Physiatrist and rehab team continue to assess barriers to discharge/monitor patient progress toward functional and medical goals  Care Tool:  Bathing    Body parts bathed by patient: Right arm, Left arm, Chest, Abdomen, Front perineal area, Right upper leg, Left upper leg, Right lower leg, Left lower leg, Face, Buttocks   Body parts bathed by helper: Buttocks, Right arm     Bathing assist Assist Level: Supervision/Verbal cueing     Upper Body Dressing/Undressing Upper body dressing   What is the patient wearing?: Pull over shirt    Upper body assist Assist Level: Supervision/Verbal cueing    Lower Body Dressing/Undressing Lower body dressing      What is the patient wearing?: Underwear/pull up, Pants     Lower body assist Assist for lower body dressing: Supervision/Verbal cueing     Toileting Toileting    Toileting assist Assist for toileting: Minimal Assistance - Patient > 75%     Transfers Chair/bed transfer  Transfers assist     Chair/bed transfer assist level: Contact Guard/Touching assist     Locomotion Ambulation   Ambulation assist      Assist level: Contact Guard/Touching assist Assistive device: Walker-rolling  Max distance: 150'   Walk 10 feet activity   Assist     Assist level: Contact Guard/Touching assist Assistive device: Walker-rolling   Walk 50 feet activity   Assist    Assist level: Contact Guard/Touching assist Assistive device: Walker-rolling    Walk 150 feet activity   Assist    Assist level: Contact Guard/Touching assist Assistive device: Walker-rolling    Walk 10 feet on uneven surface  activity   Assist     Assist level: Moderate Assistance - Patient - 50 - 74%     Wheelchair     Assist Is the patient using a wheelchair?: Yes Type of Wheelchair: Manual    Wheelchair assist level: Dependent - Patient 0% Max wheelchair distance: 150'    Wheelchair 50 feet with 2 turns  activity    Assist        Assist Level: Dependent - Patient 0%   Wheelchair 150 feet activity     Assist      Assist Level: Dependent - Patient 0%   Blood pressure 109/66, pulse (!) 54, temperature 98.3 F (36.8 C), temperature source Oral, resp. rate 16, height 5' 4 (1.626 m), weight 65.3 kg, SpO2 99%.  Medical Problem List and Plan: 1. 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  Plan repeat CTA 2-3 months to evaluate for improvement dissection             -patient may shower             -ELOS/Goals: 08-03-24  MinA             -Continue CIR   2.  Antithrombotics: -DVT/anticoagulation:  Pharmaceutical: Heparin  x 48 hours done  amb distance not recorded, goal is 150' will ask PT -antiplatelet therapy: continue Aspirin  81 mg daily and Plavix  75 mg daily x 3 months then aspirin  alone   3. Pain Management: continue Oxycodone /Fioricet  as needed, tylenol  PRN   4. Mood/Behavior/Sleep/MDD/ADHD: continue Celexa  20 mg daily.  Provide emotional support.  Psychiatry consulted             -antipsychotic agents: N/A - Patient was seen by psychology 9/11, continue current regimen and call back if further assistance needed.  Psychiatry is signed off -Melatonin PRN   5. Neuropsych/cognition: This patient is not capable of making decisions on her own behalf.  - Patient seen by neuropsychology for depression, appreciate insight   6. Skin/Wound Care: Routine skin checks   7. Fluids/Electrolytes/Nutrition: Routine ins and outs with follow-up chemistries BMET looks good, check Vit D levels-- low at 20.66, started supplementation as below   8.  PFO.  TEE showed positive PFO, at rest LTR shunting and with Valsalva RTL shunting.  Follow-up outpatient cardiology service for question PFO closure   9.  Hyperlipidemia: per neuro increase Crestor  40 mg daily, also  has elevated lipoprotein A    10.  Microcytosis/Thrombocytopenia low normal ferritin.  Placed on iron supplement Hgb nl but plt still on low side, no evidence of bleeding     Latest Ref Rng & Units 07/21/2024    6:20 AM 07/18/2024    5:26 AM 07/14/2024    5:03 AM  CBC  WBC 4.0 - 10.5 K/uL 6.7  6.7  5.8   Hemoglobin 12.0 - 15.0 g/dL 87.1  86.8  86.8   Hematocrit 36.0 - 46.0 % 39.0  39.5  39.1   Platelets 150 -  400 K/uL 143  156  131     11.  Transaminitis.  Improved, ALT mildly elevated at 47.  Continue to monitor  Recheck Monday ordered  12. Constipation  -9/12 order milk of mg 30ml -07/16/24 no BM still, started colace 200mg  daily, also taking miralax  9/18 improved , cont BM yesterday  -07/23/24 no BM documented since 9/16 but poor documentation overall, pt states LBM yesterday; monitor for now.   13.  Vitamin D  def- supplement 50, 000 U weekly x 8, rec outside for PT, OT, daily  Has phototherapy UV unit , which can generate 10-25K units of Vit D per day    LOS: 10 days A FACE TO FACE EVALUATION WAS PERFORMED  [address] 07/23/2024, 11:05 AM

## 2024-07-23 NOTE — Progress Notes (Signed)
 Physical Therapy Session Note  Patient Details  Name: Cassidy Bruce MRN: [id number] Date of Birth: 11/14/1995  Today's Date: 07/23/2024 PT Individual Time: 1448-1530 PT Individual Time Calculation (min): 42 min   Short Term Goals: Week 1:  PT Short Term Goal 1 (Week 1): Pt will perform sit to stand with CGA consistently. PT Short Term Goal 2 (Week 1): Pt will perform bed to chair with CGA consistently. PT Short Term Goal 3 (Week 1): Pt will ambulate x150' with CGA and LRAD. PT Short Term Goal 4 (Week 1): Pt will complete balance assessment.  Skilled Therapeutic Interventions/Progress Updates: Pt presents supine in bed asleep but awakens easily and agreeable to therapy.  Pt transfers to EOB w/ mod I, managing linens.  Pt transfers sit to stand w/ supervision and amb to small gym w/ CGA and no AD.  Pt has some ataxic gait, but able to maintain balance even w/ missteps.  Pt performed Nu-step at Level 3 x 10' maintaining paced program of 70 spm.  Pt performed x 632 steps and average 1.6 mets.  Pt performed multiple blocks of sit to stand w/ supervision, cueing for forward lean to avoid using back of legs on mat table.  Pt performed sit to stand w/ wedge under forefoot for increased forward lean, then holding T-ball w/ extended arms .  Pt performed horseshoe toss using L arm to retrieve and R to throw while R foot on 4 platform.  Pt reaching to R and then to left outside of BOS.  Pt returned to room and transferred to supine w/ mod I.  Bed alarm on and all needs in reach.     Therapy Documentation Precautions:  Precautions Precautions: Fall Recall of Precautions/Restrictions: Intact Restrictions Weight Bearing Restrictions Per Provider Order: No General:   Vital Signs: Therapy Vitals Temp: 98.1 F (36.7 C) Temp Source: Oral Pulse Rate: 73 Resp: 18 BP: 121/80 Patient Position (if appropriate): Lying Oxygen Therapy SpO2: 99 % O2 Device: Room Air Pain:0/10      Therapy/Group:  Individual Therapy  Cassidy Bruce P Cassidy Bruce 07/23/2024, 4:16 PM

## 2024-07-23 NOTE — Plan of Care (Signed)
  Problem: Consults Goal: RH STROKE PATIENT EDUCATION Description: See Patient Education module for education specifics  Outcome: Progressing   Problem: RH BLADDER ELIMINATION Goal: RH STG MANAGE BLADDER WITH ASSISTANCE Description: STG Manage Bladder With supervision Assistance Outcome: Progressing

## 2024-07-24 MED ORDER — MAGNESIUM HYDROXIDE 400 MG/5ML PO SUSP
45.0000 mL | Freq: Once | ORAL | Status: AC
  Administered 2024-07-24: 45 mL via ORAL
  Filled 2024-07-24: qty 60

## 2024-07-24 NOTE — Progress Notes (Signed)
 Physical Therapy Session Note  Patient Details  Name: Cassidy Bruce MRN: [id number] Date of Birth: 09-27-1996  Today's Date: 07/24/2024 PT Individual Time: 8967-8883 PT Individual Time Calculation (min): 44 min   Short Term Goals: Week 1:  PT Short Term Goal 1 (Week 1): Pt will perform sit to stand with CGA consistently. PT Short Term Goal 2 (Week 1): Pt will perform bed to chair with CGA consistently. PT Short Term Goal 3 (Week 1): Pt will ambulate x150' with CGA and LRAD. PT Short Term Goal 4 (Week 1): Pt will complete balance assessment.  Skilled Therapeutic Interventions/Progress Updates:  Pt was seen bedside in the am. Pt performed bed mobility independently. Pt performed sit to stand and stand transfers with S. Pt ambulated 150 feet x 2 without assistive device and contact guard. Pt ambulates with ataxia gait but able to self correct when loses balance. Pt rode Nu-step level 3 5 minutes x 3 for 893 steps, with 1 minutes rest break between each 5 minute increment. Treatment focused on NMR, utilizing step taps 1 x 10 reps, cone taps 2 x 10 reps, and alternating con taps 3 sets x 10 reps each with contact guard. Pt lost balance x 1 with contact guard to min A to correct. Pt returned to room and end of treatment and left sitting up in bed with all needs within reach.   Therapy Documentation Precautions:  Precautions Precautions: Fall Recall of Precautions/Restrictions: Intact Restrictions Weight Bearing Restrictions Per Provider Order: No General:   Pain: No c/o pain.   Therapy/Group: Individual Therapy  Merilee Lynwood MATSU 07/24/2024, 12:17 PM

## 2024-07-24 NOTE — Progress Notes (Signed)
 PROGRESS NOTE   Subjective/Complaints:  Pt doing well again, slept well, denies pain. LBM the other day, but none documented since 9/16-- apparently she uses the restroom without informing staff-- she's agreeable to taking MoM today to see if she can have a BM today. Urinating fine. No other complaints or concerns.   ROS: as per HPI. Denies CP, SOB, abd pain, N/V/D, or any other complaints at this time.    Objective:   No results found. No results for input(s): WBC, HGB, HCT, PLT in the last 72 hours.   No results for input(s): NA, K, CL, CO2, GLUCOSE, BUN, CREATININE, CALCIUM  in the last 72 hours.     Intake/Output Summary (Last 24 hours) at 07/24/2024 1140 Last data filed at 07/24/2024 0900 Gross per 24 hour  Intake 472 ml  Output --  Net 472 ml         Physical Exam: Vital Signs Blood pressure 119/78, pulse 69, temperature 98.1 F (36.7 C), temperature source Oral, resp. rate 16, height 5' 4 (1.626 m), weight 65.3 kg, SpO2 99%.   General: No acute distress, resting in bed watching tablet.  Mood and affect are appropriate Heart: reg rate today. Regular rhythm no rubs murmurs or extra sounds Lungs: Clear to auscultation, breathing unlabored, no rales or wheezes Abdomen: Positive bowel sounds, soft nontender to palpation, nondistended Extremities: No clubbing, cyanosis, or edema Skin: No evidence of breakdown, no evidence of rash over exposed surfaces.  Neuro:  Patient is alert.  Resting in bed.  Ataxic dysarthria.    PRIOR EXAMS: Neuro: Right upper extremity and right lower extremity 5 out of 5 strength, left upper extremity 4 out of 5 proximal and distal, left lower extremity 4+ out of 5, sensation is intact in all 4 extremities moderate dysmetria LUE FNF and LLE HS Reduced fine motor LUE with inability to oppose finger to thumb    Assessment/Plan: 1. Functional deficits which  require 3+ hours per day of interdisciplinary therapy in a comprehensive inpatient rehab setting. Physiatrist is providing close team supervision and 24 hour management of active medical problems listed below. Physiatrist and rehab team continue to assess barriers to discharge/monitor patient progress toward functional and medical goals  Care Tool:  Bathing    Body parts bathed by patient: Right arm, Left arm, Chest, Abdomen, Front perineal area, Right upper leg, Left upper leg, Right lower leg, Left lower leg, Face, Buttocks   Body parts bathed by helper: Buttocks, Right arm     Bathing assist Assist Level: Supervision/Verbal cueing     Upper Body Dressing/Undressing Upper body dressing   What is the patient wearing?: Pull over shirt    Upper body assist Assist Level: Supervision/Verbal cueing    Lower Body Dressing/Undressing Lower body dressing      What is the patient wearing?: Underwear/pull up, Pants     Lower body assist Assist for lower body dressing: Supervision/Verbal cueing     Toileting Toileting    Toileting assist Assist for toileting: Minimal Assistance - Patient > 75%     Transfers Chair/bed transfer  Transfers assist     Chair/bed transfer assist level: Contact Guard/Touching assist  Locomotion Ambulation   Ambulation assist      Assist level: Contact Guard/Touching assist Assistive device: No Device Max distance: 124   Walk 10 feet activity   Assist     Assist level: Contact Guard/Touching assist Assistive device: No Device   Walk 50 feet activity   Assist    Assist level: Contact Guard/Touching assist Assistive device: No Device    Walk 150 feet activity   Assist    Assist level: Contact Guard/Touching assist Assistive device: Walker-rolling    Walk 10 feet on uneven surface  activity   Assist     Assist level: Moderate Assistance - Patient - 50 - 74%     Wheelchair     Assist Is the patient  using a wheelchair?: Yes Type of Wheelchair: Manual    Wheelchair assist level: Dependent - Patient 0% Max wheelchair distance: 150'    Wheelchair 50 feet with 2 turns activity    Assist        Assist Level: Dependent - Patient 0%   Wheelchair 150 feet activity     Assist      Assist Level: Dependent - Patient 0%   Blood pressure 119/78, pulse 69, temperature 98.1 F (36.7 C), temperature source Oral, resp. rate 16, height 5' 4 (1.626 m), weight 65.3 kg, SpO2 99%.  Medical Problem List and Plan: 1. 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  Plan repeat CTA 2-3 months to evaluate for improvement dissection             -patient may shower             -ELOS/Goals: 08-03-24  MinA             -Continue CIR   2.  Antithrombotics: -DVT/anticoagulation:  Pharmaceutical: Heparin  x 48 hours done  amb distance not recorded, goal is 150' will ask PT -antiplatelet therapy: continue Aspirin  81 mg daily and Plavix  75 mg daily x 3 months then aspirin  alone   3. Pain Management: continue Oxycodone /Fioricet  as needed, tylenol  PRN   4. Mood/Behavior/Sleep/MDD/ADHD: continue Celexa  20 mg daily.  Provide emotional support.  Psychiatry consulted             -antipsychotic agents: N/A - Patient was seen by psychology 9/11, continue current regimen and call back if further assistance needed.  Psychiatry is signed off -Melatonin PRN   5. Neuropsych/cognition: This patient is not capable of making decisions on her own behalf.  - Patient seen by neuropsychology for depression, appreciate insight   6. Skin/Wound Care: Routine skin checks   7. Fluids/Electrolytes/Nutrition: Routine ins and outs with follow-up chemistries BMET looks good, check Vit D levels-- low at 20.66, started supplementation as below   8.  PFO.  TEE showed positive PFO, at rest LTR  shunting and with Valsalva RTL shunting.  Follow-up outpatient cardiology service for question PFO closure   9.  Hyperlipidemia: per neuro increase Crestor  40 mg daily, also has elevated lipoprotein A    10.  Microcytosis/Thrombocytopenia low normal ferritin.  Placed on iron supplement Hgb nl but plt still on low side, no evidence of bleeding     Latest Ref Rng & Units 07/21/2024    6:20 AM 07/18/2024    5:26 AM 07/14/2024    5:03 AM  CBC  WBC 4.0 - 10.5 K/uL 6.7  6.7  5.8   Hemoglobin 12.0 - 15.0 g/dL 12.8  13.1  13.1   Hematocrit 36.0 - 46.0 % 39.0  39.5  39.1   Platelets 150 - 400 K/uL 143  156  131     11.  Transaminitis.  Improved, ALT mildly elevated at 47.  Continue to monitor  Recheck 9/15 ordered and ALT still 55  12. Constipation  -9/12 order milk of mg 30ml -07/16/24 no BM still, started colace 200mg  daily, also taking miralax  9/18 improved , cont BM yesterday  -07/23/24 no BM documented since 9/16 but poor documentation overall, pt states LBM yesterday; monitor for now.  -07/24/24 unclear when LBM was, pt going to restroom without informing staff-- agreeable to MoM 45ml today to see if she can have a BM  13.  Vitamin D  def- supplement 50, 000 U weekly x 8, rec outside for PT, OT, daily  Has phototherapy UV unit , which can generate 10-25K units of Vit D per day    LOS: 11 days A FACE TO FACE EVALUATION WAS PERFORMED  [address] 07/24/2024, 11:40 AM

## 2024-07-25 LAB — BASIC METABOLIC PANEL WITH GFR
Anion gap: 6 (ref 5–15)
BUN: 7 mg/dL (ref 6–20)
CO2: 27 mmol/L (ref 22–32)
Calcium: 8.7 mg/dL — ABNORMAL LOW (ref 8.9–10.3)
Chloride: 108 mmol/L (ref 98–111)
Creatinine, Ser: 0.9 mg/dL (ref 0.44–1.00)
GFR, Estimated: >60 mL/min (ref >60)
Glucose, Bld: 82 mg/dL (ref 70–99)
Potassium: 4.1 mmol/L (ref 3.5–5.1)
Sodium: 141 mmol/L (ref 135–145)

## 2024-07-25 LAB — CBC
HCT: 38.5 % (ref 36.0–46.0)
Hemoglobin: 12.9 g/dL (ref 12.0–15.0)
MCH: 26.2 pg (ref 26.0–34.0)
MCHC: 33.5 g/dL (ref 30.0–36.0)
MCV: 78.1 fL — ABNORMAL LOW (ref 80.0–100.0)
Platelets: 111 K/uL — ABNORMAL LOW (ref 150–400)
RBC: 4.93 MIL/uL (ref 3.87–5.11)
RDW: 15.1 % (ref 11.5–15.5)
WBC: 5.4 K/uL (ref 4.0–10.5)
nRBC: 0 % (ref 0.0–0.2)

## 2024-07-25 MED ORDER — SENNOSIDES-DOCUSATE SODIUM 8.6-50 MG PO TABS
3.0000 | ORAL_TABLET | Freq: Two times a day (BID) | ORAL | Status: DC
  Administered 2024-07-25 – 2024-08-03 (×15): 3 via ORAL
  Filled 2024-07-25 (×19): qty 3

## 2024-07-25 MED ORDER — BISACODYL 10 MG RE SUPP
10.0000 mg | Freq: Once | RECTAL | Status: DC
  Filled 2024-07-25: qty 1

## 2024-07-25 NOTE — Progress Notes (Signed)
 PROGRESS NOTE   Subjective/Complaints:  LBM 9/19, has had constipation at home, no results with MOM Discussed needing something from below  ROS: as per HPI. Denies CP, SOB, abd pain, N/V/D, or any other complaints at this time.    Objective:   No results found. Recent Labs    07/25/24 0515  WBC 5.4  HGB 12.9  HCT 38.5  PLT 111*     Recent Labs    07/25/24 0515  NA 141  K 4.1  CL 108  CO2 27  GLUCOSE 82  BUN 7  CREATININE 0.90  CALCIUM  8.7*       Intake/Output Summary (Last 24 hours) at 07/25/2024 0749 Last data filed at 07/24/2024 1818 Gross per 24 hour  Intake 692 ml  Output --  Net 692 ml         Physical Exam: Vital Signs Blood pressure 106/66, pulse (!) 58, temperature 98 F (36.7 C), temperature source Oral, resp. rate 18, height 5' 4 (1.626 m), weight 65.3 kg, SpO2 100%.   General: No acute distress, resting in bed watching tablet.  Mood and affect are appropriate Heart: reg rate today. Regular rhythm no rubs murmurs or extra sounds Lungs: Clear to auscultation, breathing unlabored, no rales or wheezes Abdomen: Positive bowel sounds, soft nontender to palpation, nondistended Extremities: No clubbing, cyanosis, or edema Skin: No evidence of breakdown, no evidence of rash over exposed surfaces.  Neuro:  Patient is alert.  Resting in bed.  Ataxic dysarthria.    PRIOR EXAMS: Neuro: Right upper extremity and right lower extremity 5 out of 5 strength, left upper extremity 4 out of 5 proximal and distal, left lower extremity 4+ out of 5, sensation is intact in all 4 extremities moderate dysmetria LUE FNF and LLE HS Reduced fine motor LUE with inability to oppose finger to thumb    Assessment/Plan: 1. Functional deficits which require 3+ hours per day of interdisciplinary therapy in a comprehensive inpatient rehab setting. Physiatrist is providing close team supervision and 24 hour  management of active medical problems listed below. Physiatrist and rehab team continue to assess barriers to discharge/monitor patient progress toward functional and medical goals  Care Tool:  Bathing    Body parts bathed by patient: Right arm, Left arm, Chest, Abdomen, Front perineal area, Right upper leg, Left upper leg, Right lower leg, Left lower leg, Face, Buttocks   Body parts bathed by helper: Buttocks, Right arm     Bathing assist Assist Level: Supervision/Verbal cueing     Upper Body Dressing/Undressing Upper body dressing   What is the patient wearing?: Pull over shirt    Upper body assist Assist Level: Supervision/Verbal cueing    Lower Body Dressing/Undressing Lower body dressing      What is the patient wearing?: Underwear/pull up, Pants     Lower body assist Assist for lower body dressing: Supervision/Verbal cueing     Toileting Toileting    Toileting assist Assist for toileting: Minimal Assistance - Patient > 75%     Transfers Chair/bed transfer  Transfers assist     Chair/bed transfer assist level: Contact Guard/Touching assist     Locomotion Ambulation   Ambulation assist  Assist level: Contact Guard/Touching assist Assistive device: No Device Max distance: 150   Walk 10 feet activity   Assist     Assist level: Contact Guard/Touching assist Assistive device: No Device   Walk 50 feet activity   Assist    Assist level: Contact Guard/Touching assist Assistive device: No Device    Walk 150 feet activity   Assist    Assist level: Contact Guard/Touching assist Assistive device: No Device    Walk 10 feet on uneven surface  activity   Assist     Assist level: Moderate Assistance - Patient - 50 - 74%     Wheelchair     Assist Is the patient using a wheelchair?: Yes Type of Wheelchair: Manual    Wheelchair assist level: Dependent - Patient 0% Max wheelchair distance: 150'    Wheelchair 50 feet with  2 turns activity    Assist        Assist Level: Dependent - Patient 0%   Wheelchair 150 feet activity     Assist      Assist Level: Dependent - Patient 0%   Blood pressure 106/66, pulse (!) 58, temperature 98 F (36.7 C), temperature source Oral, resp. rate 18, height 5' 4 (1.626 m), weight 65.3 kg, SpO2 100%.  Medical Problem List and Plan: 1. 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  Plan repeat CTA 2-3 months to evaluate for improvement dissection             -patient may shower             -ELOS/Goals: 08-03-24  MinA             -Continue CIR   2.  Antithrombotics: -DVT/anticoagulation:  Pharmaceutical: Heparin  x 48 hours done  amb distance not recorded, goal is 150' will ask PT -antiplatelet therapy: continue Aspirin  81 mg daily and Plavix  75 mg daily x 3 months then aspirin  alone   3. Pain Management: continue Oxycodone /Fioricet  as needed, tylenol  PRN   4. Mood/Behavior/Sleep/MDD/ADHD: continue Celexa  20 mg daily.  Provide emotional support.  Psychiatry consulted             -antipsychotic agents: N/A - Patient was seen by psychology 9/11, continue current regimen and call back if further assistance needed.  Psychiatry is signed off -Melatonin PRN   5. Neuropsych/cognition: This patient is not capable of making decisions on her own behalf.  - Patient seen by neuropsychology for depression, appreciate insight   6. Skin/Wound Care: Routine skin checks   7. Fluids/Electrolytes/Nutrition: Routine ins and outs with follow-up chemistries BMET looks good, check Vit D levels-- low at 20.66, started supplementation as below   8.  PFO.  TEE showed positive PFO, at rest LTR shunting and with Valsalva RTL shunting.  Follow-up outpatient cardiology service for question PFO closure   9.  Hyperlipidemia: per neuro increase Crestor  40 mg daily,  also has elevated lipoprotein A    10.  Microcytosis/Thrombocytopenia low normal ferritin.  Placed on iron supplement Hgb nl but plt still on low side, no evidence of bleeding     Latest Ref Rng & Units 07/25/2024    5:15 AM 07/21/2024    6:20 AM 07/18/2024    5:26 AM  CBC  WBC 4.0 - 10.5 K/uL 5.4  6.7  6.7   Hemoglobin 12.0 - 15.0 g/dL 87.0  87.1  86.8   Hematocrit 36.0 -  46.0 % 38.5  39.0  39.5   Platelets 150 - 400 K/uL 111  143  156     11.  Transaminitis.  Improved, ALT mildly elevated at 47.  Continue to monitor  Recheck 9/15 ordered and ALT still 55  12. Constipation  Last recorded BM 9/19 Will stop Iron supplement for now has had several normal Hgbs   13.  Vitamin D  def- supplement 50, 000 U weekly x 8, rec outside for PT, OT, daily  Has phototherapy UV unit , which can generate 10-25K units of Vit D per day    LOS: 12 days A FACE TO FACE EVALUATION WAS PERFORMED  Cassidy Bruce 07/25/2024, 7:49 AM

## 2024-07-25 NOTE — Group Note (Signed)
 Patient Details Name: Cassidy Bruce MRN: [id number] DOB: [birth year] Today's Date: 07/25/2024  Time Calculation: OT Group Time Calculation OT Group Start Time: 1400 OT Group Stop Time: 1500 OT Group Time Calculation (min): 60 min      Group Description: Fine motor: Pt engaged in various table top [facility] tasks with a focus on bilateral coordination, in hand manipulation skills, motor planning, proprioception and social interaction. Tasks included seated dominos game, "old maid" game as well as folding task with pt instructed in making airplanes and various origami shapes. Emphasis on using LUE as gross assist to improve overall participation in ADLs. Pt utilized below compensatory methods to engage in tasks. Card rack to manage cards during therapeutic activities, increased time, and hand over hand assist.    Pt fully participated in session. Pt transported back to room by RT.     Pain: No indications  Cassidy Bruce Cassidy Bruce 07/25/2024, 3:23 PM

## 2024-07-25 NOTE — Progress Notes (Signed)
 Speech Language Pathology Daily Session Note  Patient Details  Name: Cassidy Bruce MRN: [id number] Date of Birth: 12-Apr-1996  Today's Date: 07/25/2024 SLP Individual Time: 9069-8985 1300-1359 SLP Individual Time Calculation (min): 44 min 59 minutes   Short Term Goals: Week 2: SLP Short Term Goal 1 (Week 2): Patient will recall and utilize memory strategies with supervision multimodal A SLP Short Term Goal 2 (Week 2): Patient will consume least restrictive diet with use of safe swallowing strategies given mod i verbal A SLP Short Term Goal 3 (Week 2): Patient will utilize dysarthria strategies to increase fluency given min multimodal A SLP Short Term Goal 4 (Week 2): Patient will demonstrate problem solving in functional tasks given supervision multimodal A  Skilled Therapeutic Interventions: Session 1: Skilled therapy session focused on cognitive goals. SLP facilitated session by prompting patient to count change according to verbalized amount. Patient completed activity with supervisionA. SLP continued to challenge patient through functional addition/subtraction word problems. Patient required minA to complete. Patient left in bed with alarm set and call bell in reach. Continue POC.  Session 2: Skilled therapy session focused on communication goals. SLP initiated session by prompting completion of 3-3-3 breathing exercises x10. Patient completed with minA. SLP then encouraged patient to complete sustiained phonation of /s/ and /z/ x5 each to aid in respiratory support and breath control. Patient with an average sustaned phonation of /s/ 10.6 seconds and /z/ 14 seconds; a significant improvement from prior sessions. SLP continued to target ataxic dysarthria through use of easy onset. Patient utilized easy onsets during vocalizations of vowel sounds, days of the week and months of the year. Patient with increased fluency during 1-2 syllable words, though continues to demonstrates dysprosody  during  >3 syllables. Direct handoff to OT. Continue POC   Pain Session 1: None reported  Session 2: None reported    Therapy/Group: Individual Therapy  Paizley Ramella M.A., CCC-SLP 07/25/2024, 7:46 AM

## 2024-07-25 NOTE — Progress Notes (Signed)
 Physical Therapy Weekly Progress Note  Patient Details  Name: Cassidy Bruce MRN: [id number] Date of Birth: 10-Dec-1995  Beginning of progress report period: July 14, 2024 End of progress report period: July 23, 2024  {CHL IP REHAB PT TIME CALCULATION:304800500}  Patient has met {number 1-5:22450} of {number 1-5:20334} short term goals.  ***  Patient continues to demonstrate the following deficits {impairments:3041632} and therefore will continue to benefit from skilled PT intervention to increase functional independence with mobility.  Patient {LTG progression:3041653}.  {plan of rjmz:6958345}  PT Short Term Goals {DUH:6958314}  Skilled Therapeutic Interventions/Progress Updates:      Therapy Documentation Precautions:  Precautions Precautions: Fall Recall of Precautions/Restrictions: Intact Restrictions Weight Bearing Restrictions Per Provider Order: No  Pain:      Therapy/Group: {Therapy/Group:3049007}  Cassidy Bruce PT, DPT, CSRS 07/23/2024, 8:00 AM

## 2024-07-25 NOTE — Progress Notes (Signed)
 Physical Therapy Session Note  Patient Details  Name: Cassidy Bruce MRN: [id number] Date of Birth: [birth year]  Today's Date: 07/25/2024 PT Individual Time: 0802-0845 PT Individual Time Calculation (min): 43 min   Short Term Goals: Week 1:  PT Short Term Goal 1 (Week 1): Pt will perform sit to stand with CGA consistently. PT Short Term Goal 2 (Week 1): Pt will perform bed to chair with CGA consistently. PT Short Term Goal 3 (Week 1): Pt will ambulate x150' with CGA and LRAD. PT Short Term Goal 4 (Week 1): Pt will complete balance assessment.  Skilled Therapeutic Interventions/Progress Updates:  Patient supine in bed and asleep on entrance to room. Pt requires time and effort to fully wake. Once she wakes, she was alert and agreeable to PT session.   Patient with no pain complaint at start of session.  Therapeutic Activity: Bed Mobility: Pt performed supine <> sit with Mod I. No cueing required. Transfers: Pt performed sit<>stand and stand pivot transfers throughout session with extra time and focus to perform with supervision. No cueing provided.  Gait Training/ NMR:  Pt ambulated 115 ft x2 using no AD with SBA. Demonstrated several instances of balance struggle but is able to re-center each time, pause, and then continue. Provided vc/ tc for LLE positioning with increased mental focus, upright posture.  During session, pt guided in resisted ambulation with pull at gait belt. Belt pulled posteriorly and to to R/ L then randomly released. Pt with some difficulty in maintaining balance but self corrects with hip and stepping strategies.   Neuromuscular Re-ed: NMR facilitated during session with focus on standing balance. Pt guided in Berg Balance test and Timed Up and Go test.   Patient demonstrates increased fall risk as noted by score of  46/56 on Berg Balance Scale.  (<36= high risk for falls, close to 100%; 37-45 significant >80%; 46-51 moderate >50%; 52-55 lower >25%)   TUG  completed in avg of 23.3 seconds (A score of >13.5 seconds indicates patient is at a high fall risk. Pt educated on interpretation of their score)    NMR performed for improvements in motor control and coordination, balance, sequencing, judgement, and self confidence/ efficacy in performing all aspects of mobility at highest level of independence.   Patient prone in bed at end of session with brakes locked, bed alarm set, and all needs within reach.   Therapy Documentation Precautions:  Precautions Precautions: Fall Recall of Precautions/Restrictions: Intact Restrictions Weight Bearing Restrictions Per Provider Order: No  Pain:  No pain related this session.   Therapy/Group: Individual Therapy  Mliss DELENA Milliner PT, DPT, CSRS 07/25/2024, 8:02 AM

## 2024-07-25 NOTE — Progress Notes (Signed)
 Physical Therapy Session Note  Patient Details  Name: Cassidy Bruce MRN: [id number] Date of Birth: [birth year]  Today's Date: 07/25/2024 PT Individual Time: 1030-1056 PT Individual Time Calculation (min): 26 min   Short Term Goals: Week 2:     Skilled Therapeutic Interventions/Progress Updates:      Pt R sidelying in bed, on her phone at start of session. She denies any pain and is agreeable to therapy treatment.   Bed mobility completed from flat bed with supervision. Able to sit unsupported at EOB with supervision. Sit<>Stand with no AD or UE support with CGA from EOB. Ambulates with CGA and no AD or UE support hallway distances, ~125ft. Gait is ataxic with errant trunk movements. Several bouts of increased trunk unsteadiness with limits of balance recovery, has brief standing rest breaks during gait to center herself and improve balance.   On mat table, worked on [employer] for coordination and core strengthening: -4 quadruped bird-dogs with CGA for safety -quadruped static scapular pushups -supine dead bugs -side plank on L elbow -side plank on R elbow -childs pose to stretch lower back  Pt ambulated back to her room with CGA with similar deficits, distance, and cues as above. She completed bed mobility without assist. Returned to her sidelying position in bed.    Therapy Documentation Precautions:  Precautions Precautions: Fall Recall of Precautions/Restrictions: Intact Restrictions Weight Bearing Restrictions Per Provider Order: No General:     Therapy/Group: Individual Therapy  Sherlean SHAUNNA Perks 07/25/2024, 10:56 AM

## 2024-07-26 ENCOUNTER — Telehealth: Payer: Self-pay

## 2024-07-26 NOTE — Progress Notes (Signed)
 Speech Language Pathology Daily Session Note  Patient Details  Name: Cassidy Bruce MRN: [id number] Date of Birth: 1996-04-03  Today's Date: 07/26/2024 SLP Individual Time: 1448-1530 SLP Individual Time Calculation (min): 42 min  Short Term Goals: Week 2: SLP Short Term Goal 1 (Week 2): Patient will recall and utilize memory strategies with supervision multimodal A SLP Short Term Goal 2 (Week 2): Patient will consume least restrictive diet with use of safe swallowing strategies given mod i verbal A SLP Short Term Goal 3 (Week 2): Patient will utilize dysarthria strategies to increase fluency given min multimodal A SLP Short Term Goal 4 (Week 2): Patient will demonstrate problem solving in functional tasks given supervision multimodal A  Skilled Therapeutic Interventions:   Pt greeted at bedside. She was awake/alert upon SLP arrival and very agreeable to tx tasks targeting cognition and speech production. SLP facilitated functional recall task ID compensatory speech strategies introduced thus far. She was able to recall these w/ supervisionA. She then completed breathing and respiration/phonation coordination exercises. She completed breathing exercises x2: 3-3-3 breathing and sustained /s/ w/ only s cues to ensure adequate breath hold. During sustained /s/, she was able to easily sustain ~12 seconds independently. She also benefited from s cues throughout breath/speech coordination exercises (1-10: increasing volume, decreasing volume, alternating volume). SLP facilitated oral reading task w/ repetitive phonemes at sentence level and assisted to ID natural pausing points to assist w/ prosody. After initial ~10 trials, pt was able to ID appropriate times to pause w/ minA. At the end of tx tasks, she was left in bed w/ the call light within reach. Recommend cont ST per POC.   Pain  None reported  Therapy/Group: Individual Therapy  [name] 07/26/2024, 4:15 PM

## 2024-07-26 NOTE — Telephone Encounter (Signed)
 Call to patient to clarify who her PCP is. Patient not a patient of this office

## 2024-07-26 NOTE — Progress Notes (Signed)
 Occupational Therapy Session Note  Patient Details  Name: Cassidy Bruce MRN: [id number] Date of Birth: 1995-12-14  Today's Date: 07/26/2024 OT Individual Time: 9083-8985 OT Individual Time Calculation (min): 58 min    Short Term Goals: Week 2:  OT Short Term Goal 1 (Week 2): Pt will compelte laundry while using L hand 50% of the time OT Short Term Goal 2 (Week 2): Pt will complete laundry while maintaining standing balance with SUP and LRAD OT Short Term Goal 3 (Week 2): Pt will complete grooming tasks in standing position with SUP and LRAD  Skilled Therapeutic Interventions/Progress Updates: Patient received sitting up on the EOB watching tv. Agreeable to OT treatment. Ambulated to therapy gym close SBA to therapy gym for table top activities working on LUE NMES. FM manipulatives working on in Proofreader. Patient able to pick up and store 4 coins before losing the coin from digit 5- definite coordination deficits in moving digit 4 & 5 independently. Supination pronation activity working on stacking cone 10 x rotation from top to bottom. Pinch strength and FM position using green peg to pick up and move 3/8' width block- 2 x 15 blocks with increased time initially to motor plan cloths pin position to not knock over the block. Patient given a yellow foam cube with sides number 1-6 to work on in hand manipulation in the room. Good return demo of the new HEP activity. Continue with skilled OT POC to improve LUE use and high level balance/IADL performance.      Therapy Documentation Precautions:  Precautions Precautions: Fall Recall of Precautions/Restrictions: Intact Restrictions Weight Bearing Restrictions Per Provider Order: No General:   Vital Signs:   Pain: Pain Assessment Pain Scale: 0-10 Pain Score: 0-No pain ADL: ADL Eating: Set up (per Pt report and chart review) Where Assessed-Eating: Bed level Grooming: Minimal assistance Where Assessed-Grooming: Edge  of bed Upper Body Bathing: Supervision/safety Where Assessed-Upper Body Bathing: Shower (sitting on TTB) Lower Body Bathing: Supervision/safety Where Assessed-Lower Body Bathing: Shower (sitting on TTB) Upper Body Dressing: Supervision/safety Where Assessed-Upper Body Dressing: Edge of bed Lower Body Dressing: Minimal assistance Where Assessed-Lower Body Dressing: Edge of bed Toileting: Minimal assistance Where Assessed-Toileting: Teacher, adult education: Minimal assistance (HHA) Toilet Transfer Method: Ambulating, Stand pivot Toilet Transfer Equipment: Engineer, technical sales: Not assessed Film/video editor: Administrator, arts Method: Designer, industrial/product: Emergency planning/management officer    Therapy/Group: Individual Therapy  Cassidy Bruce 07/26/2024, 12:07 PM

## 2024-07-26 NOTE — Progress Notes (Signed)
 PROGRESS NOTE   Subjective/Complaints:  No issues overnite , no pain , bowels and bladder doing well   ROS: as per HPI. Denies CP, SOB, abd pain, N/V/D, or any other complaints at this time.    Objective:   No results found. Recent Labs    07/25/24 0515  WBC 5.4  HGB 12.9  HCT 38.5  PLT 111*     Recent Labs    07/25/24 0515  NA 141  K 4.1  CL 108  CO2 27  GLUCOSE 82  BUN 7  CREATININE 0.90  CALCIUM  8.7*       Intake/Output Summary (Last 24 hours) at 07/26/2024 0810 Last data filed at 07/26/2024 0800 Gross per 24 hour  Intake 960 ml  Output --  Net 960 ml         Physical Exam: Vital Signs Blood pressure 111/76, pulse (!) 59, temperature 98.2 F (36.8 C), resp. rate 18, height 5' 4 (1.626 m), weight 65.3 kg, SpO2 98%.   General: No acute distress, resting in bed watching tablet.  Mood and affect are appropriate Heart: reg rate today. Regular rhythm no rubs murmurs or extra sounds Lungs: Clear to auscultation, breathing unlabored, no rales or wheezes Abdomen: Positive bowel sounds, soft nontender to palpation, nondistended Extremities: No clubbing, cyanosis, or edema Skin: No evidence of breakdown, no evidence of rash over exposed surfaces.  Neuro:  Patient is alert.  Resting in bed.  Ataxic dysarthria.    PRIOR EXAMS: Neuro: Right upper extremity and right lower extremity 5 out of 5 strength, left upper extremity 4 out of 5 proximal and distal, left lower extremity 4+ out of 5, sensation is intact in all 4 extremities moderate dysmetria LUE FNF and LLE HS Reduced fine motor LUE with inability to oppose finger to thumb    Assessment/Plan: 1. Functional deficits which require 3+ hours per day of interdisciplinary therapy in a comprehensive inpatient rehab setting. Physiatrist is providing close team supervision and 24 hour management of active medical problems listed below. Physiatrist and  rehab team continue to assess barriers to discharge/monitor patient progress toward functional and medical goals  Care Tool:  Bathing    Body parts bathed by patient: Right arm, Left arm, Chest, Abdomen, Front perineal area, Right upper leg, Left upper leg, Right lower leg, Left lower leg, Face, Buttocks   Body parts bathed by helper: Buttocks, Right arm     Bathing assist Assist Level: Supervision/Verbal cueing     Upper Body Dressing/Undressing Upper body dressing   What is the patient wearing?: Pull over shirt    Upper body assist Assist Level: Supervision/Verbal cueing    Lower Body Dressing/Undressing Lower body dressing      What is the patient wearing?: Underwear/pull up, Pants     Lower body assist Assist for lower body dressing: Supervision/Verbal cueing     Toileting Toileting    Toileting assist Assist for toileting: Minimal Assistance - Patient > 75%     Transfers Chair/bed transfer  Transfers assist     Chair/bed transfer assist level: Supervision/Verbal cueing     Locomotion Ambulation   Ambulation assist      Assist level:  Supervision/Verbal cueing Assistive device: No Device Max distance: 250 ft   Walk 10 feet activity   Assist     Assist level: Supervision/Verbal cueing Assistive device: No Device   Walk 50 feet activity   Assist    Assist level: Supervision/Verbal cueing Assistive device: No Device    Walk 150 feet activity   Assist    Assist level: Supervision/Verbal cueing Assistive device: No Device    Walk 10 feet on uneven surface  activity   Assist     Assist level: Contact Guard/Touching assist Assistive device: Other (comment) (No Device)   Wheelchair     Assist Is the patient using a wheelchair?: No (will not use on discharge and is walking community distances) Type of Wheelchair: Manual    Wheelchair assist level: Dependent - Patient 0% Max wheelchair distance: 150'    Wheelchair 50  feet with 2 turns activity    Assist        Assist Level: Dependent - Patient 0%   Wheelchair 150 feet activity     Assist      Assist Level: Dependent - Patient 0%   Blood pressure 111/76, pulse (!) 59, temperature 98.2 F (36.8 C), resp. rate 18, height 5' 4 (1.626 m), weight 65.3 kg, SpO2 98%.  Medical Problem List and Plan: 1. 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  Plan repeat CTA 2-3 months to evaluate for improvement dissection             -patient may shower             -ELOS/Goals: 08-03-24  MinA             -Continue CIR   2.  Antithrombotics: -DVT/anticoagulation:  Pharmaceutical: no tx required amb distance >100' -antiplatelet therapy: continue Aspirin  81 mg daily and Plavix  75 mg daily x 3 months then aspirin  alone   3. Pain Management: continue Oxycodone /Fioricet  as needed, tylenol  PRN   4. Mood/Behavior/Sleep/MDD/ADHD: continue Celexa  20 mg daily.  Provide emotional support.  Psychiatry consulted             -antipsychotic agents: N/A - Patient was seen by psychology 9/11, continue current regimen and call back if further assistance needed.  Psychiatry is signed off -Melatonin PRN   5. Neuropsych/cognition: This patient is not capable of making decisions on her own behalf.  - Patient seen by neuropsychology for depression, appreciate insight   6. Skin/Wound Care: Routine skin checks   7. Fluids/Electrolytes/Nutrition: Routine ins and outs with follow-up chemistries BMET looks good, check Vit D levels-- low at 20.66, started supplementation as below   8.  PFO.  TEE showed positive PFO, at rest LTR shunting and with Valsalva RTL shunting.  Follow-up outpatient cardiology service for question PFO closure   9.  Hyperlipidemia: per neuro increase Crestor  40 mg daily, also has elevated lipoprotein A    10.   Microcytosis/Thrombocytopenia low normal ferritin.  Placed on iron supplement Hgb nl but plt still on low side, no evidence of bleeding     Latest Ref Rng & Units 07/25/2024    5:15 AM 07/21/2024    6:20 AM 07/18/2024    5:26 AM  CBC  WBC 4.0 - 10.5 K/uL 5.4  6.7  6.7   Hemoglobin 12.0 - 15.0 g/dL 87.0  87.1  86.8   Hematocrit 36.0 - 46.0 % 38.5  39.0  39.5  Platelets 150 - 400 K/uL 111  143  156     11.  Transaminitis.  Improved, ALT mildly elevated at 47.  Continue to monitor  Recheck 9/15 ordered and ALT still 55  12. Constipation  Last recorded BM 9/19 Will stop Iron supplement for now has had several normal Hgbs   13.  Vitamin D  def- supplement 50, 000 U weekly x 8, rec outside for PT, OT, daily  Has phototherapy UV unit , which can generate 10-25K units of Vit D per day    LOS: 13 days A FACE TO FACE EVALUATION WAS PERFORMED  Prentice FORBES Compton 07/26/2024, 8:10 AM

## 2024-07-26 NOTE — Progress Notes (Signed)
 Physical Therapy Session Note  Patient Details  Name: Cassidy Bruce MRN: [id number] Date of Birth: 1996/08/20  Today's Date: 07/26/2024 PT Individual Time: 1404-1450 PT Individual Time Calculation (min): 46 min   Short Term Goals: Week 1:  PT Short Term Goal 1 (Week 1): Pt will perform sit to stand with CGA consistently. PT Short Term Goal 1 - Progress (Week 1): Met PT Short Term Goal 2 (Week 1): Pt will perform bed to chair with CGA consistently. PT Short Term Goal 2 - Progress (Week 1): Met PT Short Term Goal 3 (Week 1): Pt will ambulate x150' with CGA and LRAD. PT Short Term Goal 3 - Progress (Week 1): Met PT Short Term Goal 4 (Week 1): Pt will complete balance assessment. PT Short Term Goal 4 - Progress (Week 1): Met Week 2:  PT Short Term Goal 1 (Week 2): Pt will perform bed to chair with Mod I consistently. PT Short Term Goal 2 (Week 2): Pt will ambulate x150' with consistent supervision and no AD. PT Short Term Goal 3 (Week 2): Pt will perform stair training with supervision and 1 HR. PT Short Term Goal 4 (Week 2): Pt will complete floor transfer with supervision.  Skilled Therapeutic Interventions/Progress Updates:  Patient prone and asleep in bed on entrance to room. Requires time to fully wake and then is alert and agreeable to PT session.   Patient with no pain complaint at start of session.  Per orders, pt taken outside for daylight exposure. Dependently transported to patio outside of [facility] near fountain.   Therapeutic Activity: Bed Mobility: Pt performed supine <> sit with Mod I. Transfers: Pt performed sit<>stand and stand pivot transfers throughout session with focus and care and supervision. No cueing required.  Neuromuscular Re-ed: NMR facilitated during session with focus on standing balance, motor control, coordination. Pt guided in standing ball toss and bounce. Pt standing in front of w/c with no RW and participates in bounce pass with therapist. Pt slowly  increases distance throughout pt's ability to stand for 15 min and guided in use of BUE for toss. Also progressed placement and height of ball to pt. Requires one instance of MinA to maintain balance with extensive reach in attempt to catch ball outside of BOS. Cued for conscious practice of smooth movement with LUE in push of ball forward as pt initially does not want to use LUE. Also cued and educated on need for use of stepping strategy to maintain balance. Improves in quality of movement throughout. Pt relates previously participating in volleyball while in school. Next guided in using LUE to spike ball down to floor for bounce pass. Requires encouragement and blocked practice as pt scared to attempt initially. Again improves with practice.   NMR performed for improvements in motor control and coordination, balance, sequencing, judgement, and self confidence/ efficacy in performing all aspects of mobility at highest level of independence.   Gait Training:  Pt ambulated 100' x1 using no AD with close supervision/ CGA. Guided with cues from previous session in focus on moderate step length in step through pattern with smooth progression. Demos intermittent balance wobble with need for crossover step for balance strategy. Provided vc/ tc for corrections of above.   Patient prone again at end of session with brakes locked, bed alarm set, and all needs within reach.   Therapy Documentation Precautions:  Precautions Precautions: Fall Recall of Precautions/Restrictions: Intact Restrictions Weight Bearing Restrictions Per Provider Order: No  Pain:  No pain related this session.  Therapy/Group: Individual Therapy  Mliss DELENA Milliner PT, DPT, CSRS 07/26/2024, 5:20 PM

## 2024-07-26 NOTE — Telephone Encounter (Signed)
 Copied from CRM #[id number]. Topic: Appointments - Scheduling Inquiry for Clinic >> Jul 26, 2024  2:05 PM Anairis L wrote: Reason for CRM: Nurse Rhoda Dupr is calling in to schedule a Hospital follow up for Mrs. Nies. She will be discharge 08/03/2024 but there is no opening until mid November. Please give her a call at [phone]

## 2024-07-26 NOTE — Progress Notes (Addendum)
 Patient ID: Cassidy Bruce, female   DOB: 1996-08-23, 28 y.o.   MRN: [id number]  Two aunts here asking about someone coming according to pt to assist with her disability application. Contacted [facility] to find out this information. Leonor Endo is coming tomorrow at 11 to assist her with the disability application. Have informed all of this and aunt will come back tomorrow.  2:05 PM  Contacted [facility], [facility], [facility]. [facility] is checking to see if can get into within the time period of discharge. Trying to get PCP for pt at discharge.

## 2024-07-26 NOTE — Progress Notes (Signed)
 Physical Therapy Session Note  Patient Details  Name: Cassidy Bruce MRN: [id number] Date of Birth: 11/10/1995  Today's Date: 07/26/2024 PT Individual Time: 1122-1210 PT Individual Time Calculation (min): 48 min   Short Term Goals: Week 1:  PT Short Term Goal 1 (Week 1): Pt will perform sit to stand with CGA consistently. PT Short Term Goal 1 - Progress (Week 1): Met PT Short Term Goal 2 (Week 1): Pt will perform bed to chair with CGA consistently. PT Short Term Goal 2 - Progress (Week 1): Met PT Short Term Goal 3 (Week 1): Pt will ambulate x150' with CGA and LRAD. PT Short Term Goal 3 - Progress (Week 1): Met PT Short Term Goal 4 (Week 1): Pt will complete balance assessment. PT Short Term Goal 4 - Progress (Week 1): Met Week 2:  PT Short Term Goal 1 (Week 2): Pt will perform bed to chair with Mod I consistently. PT Short Term Goal 2 (Week 2): Pt will ambulate x150' with consistent supervision and no AD. PT Short Term Goal 3 (Week 2): Pt will perform stair training with supervision and 1 HR. PT Short Term Goal 4 (Week 2): Pt will complete floor transfer with supervision.  Skilled Therapeutic Interventions/Progress Updates:  Patient seated upright on EOB on entrance to room. Patient alert and agreeable to PT session. Two aunts in room.   Patient with no pain complaint at start of session.  Therapeutic Activity: Transfers: Pt performed sit<>stand and stand pivot transfers throughout session with supervision. Demos continued focus to good technique.  Gait Training:  Pt ambulated >300' x2 using no AD with close supervision/ CGA. Demonstrated intermittent missteps d/t reduced coordination. Also demos step-to progression as well as increased time on RLE requiring vc and visual demonstration to correct. Improvement following cues. Slow but continuous pace. Used metronome at 65 bpm for focus to pace and to equalize stance time bilaterally. Brief periods of matching pace and stance time then  cued to reset and focus on R/L with each beat. Again demos ability to match beats for brief periods.   Neuromuscular Re-ed: NMR facilitated during session with focus on dynamic balance. Pt guided in monster walks with pt in ready position with red t-band around lower thighs. Cued to maintain tension in band and perform lateral step outs over 25 feet in each direction x3. Then guided in four square stepping and vc provided for direction of step. Good technique with slow progression and focus. Then guided in TaiChi movements in lateral squats with large arm circles in direction of step-out. x8 to each side. Requires cue initially for more dynamic push back to start position with leading LE. NMR performed for improvements in motor control and coordination, balance, sequencing, judgement, and self confidence/ efficacy in performing all aspects of mobility at highest level of independence.   Patient seated on EOB at end of session with brakes locked, bed alarm set, and all needs within reach.   Therapy Documentation Precautions:  Precautions Precautions: Fall Recall of Precautions/Restrictions: Intact Restrictions Weight Bearing Restrictions Per Provider Order: No  Pain:  No pain related this session.   Therapy/Group: Individual Therapy  Mliss DELENA Milliner PT, DPT, CSRS 07/26/2024, 5:18 PM

## 2024-07-27 NOTE — Progress Notes (Signed)
 Speech Language Pathology Daily Session Note  Patient Details  Name: Cassidy Bruce MRN: [id number] Date of Birth: Mar 09, 1996  Today's Date: 07/27/2024 SLP Individual Time: 9054-8967 SLP Individual Time Calculation (min): 47 min  Short Term Goals: Week 2: SLP Short Term Goal 1 (Week 2): Patient will recall and utilize memory strategies with supervision multimodal A SLP Short Term Goal 2 (Week 2): Patient will consume least restrictive diet with use of safe swallowing strategies given mod i verbal A SLP Short Term Goal 3 (Week 2): Patient will utilize dysarthria strategies to increase fluency given min multimodal A SLP Short Term Goal 4 (Week 2): Patient will demonstrate problem solving in functional tasks given supervision multimodal A  Skilled Therapeutic Interventions:  Patient was seen in am to address cogntiive re- training and speech intelligibility. Pt was alert and seated at EOB upon SLP arrival. She was agreeable for session. he completed SLP guided pt in completion of breathing exercises where she completed 1 set of 10  3-3-3 breathing given min A for controlled exhalation. She was further guided in  sustained phonation of /s/  and /z/. She sustained /s/ on average of 12 seconds and /z/ on average of 15 seconds demonstrating increasing improvement. In other minutes of session, SLP reviewed compensatory memory strategies. Pt able to refer to memory notebook implemented in previous sessions though admitted to not using it recently. SLP introduced additional external aids including use of notes, reminders, and calendar app on phone. SLP challenging pt recall this date through recall of a shopping list. Task further increased in complexity as pt was challenged to recall list while utilizing problem solving skills in order to navigate hospital to gift shop. Pt recalled shopping list after a ~7 minute delay along with accurately utilizing hospital maps and locating items in gift shop. In other  minutes of session, SLP engaged pt's speech intelligibility though a guided conversational exchange. Pt benefited from min A initially for use of easy onset strategy with cues able to be faded to S by conclusion of session. Pt also recalled external aids introduced earlier in session with mod I. Pt was left upright on EOB at conclusion of session with call button within reach. SLP to continue POC.   Pain Pain Assessment Pain Scale: 0-10 Pain Score: 0-No pain  Therapy/Group: Individual Therapy  Joane GORMAN Fuss 07/27/2024, 10:37 AM

## 2024-07-27 NOTE — Progress Notes (Signed)
 Physical Therapy Session Note  Patient Details  Name: Cassidy Bruce MRN: [id number] Date of Birth: [birth year]  Today's Date: 07/27/2024 PT Individual Time: 0802-0846 PT Individual Time Calculation (min): 44 min   Short Term Goals: Week 1:  PT Short Term Goal 1 (Week 1): Pt will perform sit to stand with CGA consistently. PT Short Term Goal 1 - Progress (Week 1): Met PT Short Term Goal 2 (Week 1): Pt will perform bed to chair with CGA consistently. PT Short Term Goal 2 - Progress (Week 1): Met PT Short Term Goal 3 (Week 1): Pt will ambulate x150' with CGA and LRAD. PT Short Term Goal 3 - Progress (Week 1): Met PT Short Term Goal 4 (Week 1): Pt will complete balance assessment. PT Short Term Goal 4 - Progress (Week 1): Met Week 2:  PT Short Term Goal 1 (Week 2): Pt will perform bed to chair with Mod I consistently. PT Short Term Goal 2 (Week 2): Pt will ambulate x150' with consistent supervision and no AD. PT Short Term Goal 3 (Week 2): Pt will perform stair training with supervision and 1 HR. PT Short Term Goal 4 (Week 2): Pt will complete floor transfer with supervision.  Skilled Therapeutic Interventions/Progress Updates:  Patient prone in bed and asleep on entrance to room. Patient alert and agreeable to PT session.   Patient with no pain complaint at start of session.  Therapeutic Activity: Bed Mobility: Pt performed supine <> sit with Mod I. Transfers: Pt performed sit<>stand and stand pivot transfers throughout session with good focus on technique and supervision overall.  Pt guided in floor transfer training. She is able to reach floor safely and quickly following visual demonstration. She is able to raise hips from sidelying and push up to quadruped. Then can bring RLE forward and push up to feet with hands pressing and launching up to standing with fair balance. Educated on use of low seat to assist if necessary in future.   Gait Training:  Pt ambulated 115'  using no AD  with close supervision/ CGA. Demonstrated increased focus to performance. Initially produces increased step lengths and crossover stepping with LLE. With vc, she is able to self correct with intermittent missteps.   Return amb bout to room with bounce pass back and forth with therapist throughout. Pt requires pause in stepping in order to bounce ball with BUE. One LOB with pt able to self correct using small stepping strategy appropriately. Very slow pace throughout. Completed with close supervision.   Neuromuscular Re-ed: NMR facilitated during session with focus on balance, motor control, ataxia. Pt guided in attaining half kneel position on floor. She is able to demonstrate hand positioning for volleyball bump with hand over hand flat palm fold. Guided in forward lean over front LE to touch ball on floor with Bil fingertips, to bump ball at knee height, then set ball at overhead height all while in half knee position. Switched forward LE. No LOB throughout.   Standing balance challenged with hold of tidal tank and ambulation over short distances with improved ability to step in more appropriate step length throughout.   Then static stance with hold of tidal tank vertically and therapist pushing tank in differing directions to challenge balance.   NMR performed for improvements in motor control and coordination, balance, sequencing, judgement, and self confidence/ efficacy in performing all aspects of mobility at highest level of independence.   Patient upine in bed at end of session with brakes locked, bed alarm  set, and all needs within reach.   Therapy Documentation Precautions:  Precautions Precautions: Fall Recall of Precautions/Restrictions: Intact Restrictions Weight Bearing Restrictions Per Provider Order: No  Pain: Pain Assessment Pain Scale: 0-10 Pain Score: 0-No pain related this session.    Therapy/Group: Individual Therapy  Mliss DELENA Milliner PT, DPT, CSRS 07/27/2024,  10:26 AM

## 2024-07-27 NOTE — Progress Notes (Signed)
 Occupational Therapy Session Note  Patient Details  Name: Cassidy Bruce MRN: [id number] Date of Birth: 11/08/1995  Today's Date: 07/27/2024 OT Individual Time: 9149-9055 OT Individual Time Calculation (min): 54 min    Short Term Goals: Week 2:  OT Short Term Goal 1 (Week 2): Pt will compelte laundry while using L hand 50% of the time OT Short Term Goal 2 (Week 2): Pt will complete laundry while maintaining standing balance with SUP and LRAD OT Short Term Goal 3 (Week 2): Pt will complete grooming tasks in standing position with SUP and LRAD  Skilled Therapeutic Interventions/Progress Updates:  Pt greeted in bathroom, direct hand off from NT. pt agreeable to OT intervention.      Transfers/bed mobility/functional mobility: pt completed all functional ambulation with no AD and CGA.   Therapeutic activity:  Pt completed ambulatory balance challenge with pt given cone in R hand and bean bag on top of cone with pt instructed to walk while balancing bean bag on top of cone to improve RUE proprioception and decrease ataxia. Noted cadence in gait during task with pt dropping cone 4x.   Worked on bimanual UE activity with pt completing sit>stands from EOM while holding ball and reaching ball OH while ascending into stand with a focus on improving GM movement and decreasing ataixa, emphasis on providing equal proprioception on ball.   Pt completed various toss/catch patterns to rebounder to facilitate improved GM movement with LUE.   ADLs:  Grooming: pt stood at sink for hand hygiene with supervision.   Transfers: ambulatory ADL transfer with no AD and CGA.  Toileting: continent urine void, pt completed 3/3 toileting task with supervision.    IADLS: Pt completed ambulatory IADL task with pt able to ambulate around gym to collect wash cloths with LUE and then transport items to laundry room to complete laundry task. Emphasis on reaching with LUE at dominant level. Discussed using liquid  detergent vs pods however pt able to demo ability to pour water  with RUE and LUE holding cup.                   Ended session with pt seated in bed with all needs within reach.  Therapy Documentation Precautions:  Precautions Precautions: Fall Recall of Precautions/Restrictions: Intact Restrictions Weight Bearing Restrictions Per Provider Order: No  Pain: No pain    Therapy/Group: Individual Therapy  Ronal Mallie Needy 07/27/2024, 12:10 PM

## 2024-07-27 NOTE — Plan of Care (Signed)
  Problem: Consults Goal: RH STROKE PATIENT EDUCATION Description: See Patient Education module for education specifics  Outcome: Progressing   Problem: RH BOWEL ELIMINATION Goal: RH STG MANAGE BOWEL WITH ASSISTANCE Description: STG Manage Bowel with supervision Assistance. Outcome: Progressing   Problem: RH BLADDER ELIMINATION Goal: RH STG MANAGE BLADDER WITH ASSISTANCE Description: STG Manage Bladder With supervision Assistance Outcome: Progressing   Problem: RH SKIN INTEGRITY Goal: RH STG SKIN FREE OF INFECTION/BREAKDOWN Description: Manage skin free of infection with supervision assistance Outcome: Progressing   Problem: RH SAFETY Goal: RH STG ADHERE TO SAFETY PRECAUTIONS W/ASSISTANCE/DEVICE Description: STG Adhere to Safety Precautions With Assistance/Device. Outcome: Progressing   Problem: RH PAIN MANAGEMENT Goal: RH STG PAIN MANAGED AT OR BELOW PT'S PAIN GOAL Description: <4 w/ prns Outcome: Progressing   Problem: RH KNOWLEDGE DEFICIT Goal: RH STG INCREASE KNOWLEGDE OF HYPERLIPIDEMIA Description: Manage increase knowledge of hyperlipidemia with supervision assistance from friend/ aunt using educational materials provided Outcome: Progressing Goal: RH STG INCREASE KNOWLEDGE OF STROKE PROPHYLAXIS Description: Manage increase knowledge of stroke prophylaxis with supervision assistance from friend/ aunt using educational materials provided Outcome: Progressing

## 2024-07-27 NOTE — Progress Notes (Signed)
 Occupational Therapy Weekly Progress Note  Patient Details  Name: Cassidy Bruce MRN: [id number] Date of Birth: 03-26-1996  Beginning of progress report period: July 22, 2024 End of progress report period: July 27, 2024  Today's Date: 07/27/2024   Patient has met 3 of 3 short term goals.  Pt making excellent progress this reporting period with pt completing ADLs at overall supervision level, pt completes functional ambulation with no AD with CGA- supervision. Pt continues to present with LUE incoordination and balance deficits. Pt began IADL training this reporting period d/t pt progress. DC plan still pending as pts family is working to put together housing. OT POC appropriate with target DC date set for 10/1.   Patient continues to demonstrate the following deficits: {impairments:3041632} and therefore will continue to benefit from skilled OT intervention to enhance overall performance with {ADL/iADL:3041649}.  Patient {LTG progression:3041653}.  {plan of rjmz:6958345}  OT Short Term Goals Week 1:  OT Short Term Goal 1 (Week 1): Pt will complete U/LB bathing with CGA OT Short Term Goal 1 - Progress (Week 1): Met OT Short Term Goal 2 (Week 1): Pt will maintain L UE in safe position during bed mobility with MIN verbal cues OT Short Term Goal 2 - Progress (Week 1): Met OT Short Term Goal 3 (Week 1): Pt will recall hemi dressing techniques with MIN verbal cues OT Short Term Goal 3 - Progress (Week 1): Met OT Short Term Goal 4 (Week 1): Pt will complete shower transfer with CGA using LRAD OT Short Term Goal 4 - Progress (Week 1): Met Week 2:  OT Short Term Goal 1 (Week 2): Pt will compelte laundry while using L hand 50% of the time OT Short Term Goal 1 - Progress (Week 2): Met OT Short Term Goal 2 (Week 2): Pt will complete laundry while maintaining standing balance with SUP and LRAD OT Short Term Goal 2 - Progress (Week 2): Met OT Short Term Goal 3 (Week 2): Pt will complete  grooming tasks in standing position with SUP and LRAD OT Short Term Goal 3 - Progress (Week 2): Met    Therapy Documentation Precautions:  Precautions Precautions: Fall Recall of Precautions/Restrictions: Intact Restrictions Weight Bearing Restrictions Per Provider Order: No    Therapy/Group: Individual Therapy  Ronal Gift [facility] 07/27/2024, 1:16 PM

## 2024-07-27 NOTE — Progress Notes (Signed)
 Patient ID: Cassidy Bruce, female   DOB: 08/05/1996, 28 y.o.   MRN: [id number]  Met with pt and Wylie Ards to give team conference update with progress this week in therapies. Pt was smiling and happy about the progress she has made this week. Still targeting 10/1 for discharge date. Working on getting into one of the [facility]'s so will have a PCP follow at discharge. Leonor from [facility] did come and assist with disability application. Discussed trying to have pt be safe enough to not use a rolling walker since she will probably not use at dischafrge which pt agrees with. Will continue to work on discharge needs.

## 2024-07-27 NOTE — Progress Notes (Signed)
 PROGRESS NOTE   Subjective/Complaints:  No issues overnite , no pain , bowels and bladder doing well   ROS: as per HPI. Denies CP, SOB, abd pain, N/V/D, or any other complaints at this time.    Objective:   No results found. Recent Labs    07/25/24 0515  WBC 5.4  HGB 12.9  HCT 38.5  PLT 111*     Recent Labs    07/25/24 0515  NA 141  K 4.1  CL 108  CO2 27  GLUCOSE 82  BUN 7  CREATININE 0.90  CALCIUM  8.7*       Intake/Output Summary (Last 24 hours) at 07/27/2024 0809 Last data filed at 07/27/2024 0736 Gross per 24 hour  Intake 720 ml  Output --  Net 720 ml         Physical Exam: Vital Signs Blood pressure 109/75, pulse 65, temperature 98.4 F (36.9 C), resp. rate 18, height 5' 4 (1.626 m), weight 65.3 kg, SpO2 98%.   General: No acute distress, resting in bed watching tablet.  Mood and affect are appropriate Heart: reg rate today. Regular rhythm no rubs murmurs or extra sounds Lungs: Clear to auscultation, breathing unlabored, no rales or wheezes Abdomen: Positive bowel sounds, soft nontender to palpation, nondistended Extremities: No clubbing, cyanosis, or edema Skin: No evidence of breakdown, no evidence of rash over exposed surfaces.  Neuro:  Patient is alert.  Resting in bed.  Ataxic dysarthria.    PRIOR EXAMS: Neuro: Right upper extremity and right lower extremity 5 out of 5 strength, left upper extremity 4 out of 5 proximal and distal, left lower extremity 4+ out of 5, sensation is intact in all 4 extremities moderate dysmetria LUE FNF and LLE HS Reduced fine motor LUE with inability to oppose finger to thumb    Assessment/Plan: 1. Functional deficits which require 3+ hours per day of interdisciplinary therapy in a comprehensive inpatient rehab setting. Physiatrist is providing close team supervision and 24 hour management of active medical problems listed below. Physiatrist and rehab  team continue to assess barriers to discharge/monitor patient progress toward functional and medical goals  Care Tool:  Bathing    Body parts bathed by patient: Right arm, Left arm, Chest, Abdomen, Front perineal area, Right upper leg, Left upper leg, Right lower leg, Left lower leg, Face, Buttocks   Body parts bathed by helper: Buttocks, Right arm     Bathing assist Assist Level: Supervision/Verbal cueing     Upper Body Dressing/Undressing Upper body dressing   What is the patient wearing?: Pull over shirt    Upper body assist Assist Level: Supervision/Verbal cueing    Lower Body Dressing/Undressing Lower body dressing      What is the patient wearing?: Underwear/pull up, Pants     Lower body assist Assist for lower body dressing: Supervision/Verbal cueing     Toileting Toileting    Toileting assist Assist for toileting: Minimal Assistance - Patient > 75%     Transfers Chair/bed transfer  Transfers assist     Chair/bed transfer assist level: Supervision/Verbal cueing     Locomotion Ambulation   Ambulation assist      Assist level: Supervision/Verbal  cueing Assistive device: No Device Max distance: 250 ft   Walk 10 feet activity   Assist     Assist level: Supervision/Verbal cueing Assistive device: No Device   Walk 50 feet activity   Assist    Assist level: Supervision/Verbal cueing Assistive device: No Device    Walk 150 feet activity   Assist    Assist level: Supervision/Verbal cueing Assistive device: No Device    Walk 10 feet on uneven surface  activity   Assist     Assist level: Contact Guard/Touching assist Assistive device: Other (comment) (No Device)   Wheelchair     Assist Is the patient using a wheelchair?: No (will not use on discharge and is walking community distances) Type of Wheelchair: Manual    Wheelchair assist level: Dependent - Patient 0% Max wheelchair distance: 150'    Wheelchair 50 feet  with 2 turns activity    Assist        Assist Level: Dependent - Patient 0%   Wheelchair 150 feet activity     Assist      Assist Level: Dependent - Patient 0%   Blood pressure 109/75, pulse 65, temperature 98.4 F (36.9 C), resp. rate 18, height 5' 4 (1.626 m), weight 65.3 kg, SpO2 98%.  Medical Problem List and Plan: 1. 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  Plan repeat CTA 2-3 months to evaluate for improvement dissection             -patient may shower             -ELOS/Goals: 08-03-24  MinA             -Continue CIR PT, OT, SLP ataxia main issue   2.  Antithrombotics: -DVT/anticoagulation:  Pharmaceutical: no tx required amb distance >100' -antiplatelet therapy: continue Aspirin  81 mg daily and Plavix  75 mg daily x 3 months then aspirin  alone   3. Pain Management: continue Oxycodone /Fioricet  as needed, tylenol  PRN   4. Mood/Behavior/Sleep/MDD/ADHD: continue Celexa  20 mg daily.  Provide emotional support.  Psychiatry consulted             -antipsychotic agents: N/A - Patient was seen by psychology 9/11, continue current regimen and call back if further assistance needed.  Psychiatry is signed off -Melatonin PRN   5. Neuropsych/cognition: This patient is not capable of making decisions on her own behalf.  - Patient seen by neuropsychology for depression, appreciate insight   6. Skin/Wound Care: Routine skin checks   7. Fluids/Electrolytes/Nutrition: Routine ins and outs with follow-up chemistries BMET looks good, check Vit D levels-- low at 20.66, started supplementation as below   8.  PFO.  TEE showed positive PFO, at rest LTR shunting and with Valsalva RTL shunting.  Follow-up outpatient cardiology service for question PFO closure   9.  Hyperlipidemia: per neuro increase Crestor  40 mg daily, also has elevated lipoprotein A     10.  Microcytosis/Thrombocytopenia low normal ferritin.  Placed on iron supplement Hgb nl but plt still on low side, no evidence of bleeding     Latest Ref Rng & Units 07/25/2024    5:15 AM 07/21/2024    6:20 AM 07/18/2024    5:26 AM  CBC  WBC 4.0 - 10.5 K/uL 5.4  6.7  6.7   Hemoglobin 12.0 - 15.0 g/dL 87.0  87.1  86.8   Hematocrit 36.0 - 46.0 % 38.5  39.0  39.5   Platelets 150 - 400 K/uL 111  143  156     11.  Transaminitis.  Improved, ALT mildly elevated at 47.  Continue to monitor  Recheck 9/15 ordered and ALT still 55  12. Constipation  Last recorded BM 9/19 Will stop Iron supplement for now has had several normal Hgbs   13.  Vitamin D  def- supplement 50, 000 U weekly x 8, rec outside for PT, OT, daily  Has phototherapy UV unit , which can generate 10-25K units of Vit D per day    LOS: 14 days A FACE TO FACE EVALUATION WAS PERFORMED  Prentice FORBES Compton 07/27/2024, 8:09 AM

## 2024-07-27 NOTE — Plan of Care (Signed)
  Problem: RH Bathing Goal: LTG Patient will bathe all body parts with assist levels (OT) Description: LTG: Patient will bathe all body parts with assist levels (OT) Flowsheets (Taken 07/27/2024 1039) LTG: Pt will perform bathing with assistance level/cueing: (LTG upgraded due to progress.) Independent with assistive device  Note: LTG upgraded due to progress.

## 2024-07-27 NOTE — Patient Care Conference (Signed)
 Inpatient RehabilitationTeam Conference and Plan of Care Update Date: 07/27/2024   Time: 10:35 AM    Patient Name: Cassidy Bruce      Medical Record Number: [id number]  Date of Birth: 09/19/1996 Sex: Female         Room/Bed: 4M07C/4M07C-01 Payor Info: Payor: /    Admit Date/Time:  07/13/2024  3:46 PM  Primary Diagnosis:  Thromboembolic stroke [facility])  Hospital Problems: Principal Problem:   Thromboembolic stroke (HCC) Active Problems:   MDD (major depressive disorder), recurrent episode, moderate (HCC)   Severe episode of recurrent major depressive disorder, without psychotic features (HCC)   Panic disorder with agoraphobia and moderate panic attacks    Expected Discharge Date: Expected Discharge Date: 08/03/24  Team Members Present: Physician leading conference: Dr. Prentice Compton Social Worker Present: Rhoda Clement, LCSW Nurse Present: Barnie Ronde, RN PT Present: Recardo Milliner, PT OT Present: Recardo Maxwell, OT SLP Present: Recardo Mole, SLP     Current Status/Progress Goal Weekly Team Focus  Bowel/Bladder   Continent with B/B  LBM 9/23   Will remain continent   Assist with toilet needs qshift/prn    Swallow/Nutrition/ Hydration               ADL's   supervision overall with self care, CGA for ambulation without AD   mod ind   NMR, ADL retraining, dynamic balance, pt education    Mobility   Bed mobility = Mod I; STS = supervision, Stand pivot = CGA/ supervision, Ambulation with RW = CGA/ supervision with ataxia in trunk and LLE, ambulation without AD = CGA/ supervision for balance d/t ataxia, stairs require UE support and completes 12 with CGA.   overall supervision/ mod I  Barriers: d/c location, ataxia, balance /// Work On: L hemibody NMR for ataxia, standing balance, quality of gait, LOA in transfers, family/ caregiver education    Communication   moderate ataxic dysarthria remains   supervisionA   carryover of compensatory speech strategies, prosody  exercises, speech tasks of increased complexity    Safety/Cognition/ Behavioral Observations  minA   supervisionA   cognitive retraining: STM, problem solving, awareness    Pain   Denies pain at this time   Will be free from pain   Assess patient for pain qshift/prn    Skin   Skin is intact   Will maintain skin intergrity with no breakdown  Assess skin for breakdown qshift/prn      Discharge Planning:  Local aunt working on housing for pt, but informed goals are supervision level. Pt plans to go back with roommate. Applying for SSD today at 11;00. Medicaid application pending. Aunt's need to come up with a plan. Not an option for NHP-uninsured and young.   Team Discussion: Patient admitted post CVA after dissection with PFO/shunting, truncal ataxia, dysarthria and min - mod cognitive deficits, poor awareness and problem solving issues. Vitamin D  deficiency addressed.  Patient on target to meet rehab goals: yes, currently needs supervision for ADLs, and transfers. Needs CGA for ambulation without an assistive device. Overall supervision - CGA for mobility.  Needs min assist for cognition: problem solving and awareness and mod assist for ataxic dysarthria. Goals for discharge set for mod I overall and supervision for cognition.  *See Care Plan and progress notes for long and short-term goals.   Revisions to Treatment Plan:  CTA pending Cardiology follow up services   Teaching Needs: Safety, medications, transfers, toileting, etc.   Current Barriers to Discharge: Lack of/limited family support, Insurance  for SNF coverage, and lack of insurance for follow up services  Possible Resolutions to Barriers: DME: RW     Medical Summary Current Status: speech and left sided ataxia, poor standing balance  Barriers to Discharge: Medical stability   Possible Resolutions to Barriers/Weekly Focus: limited outpt f/u   Continued Need for Acute Rehabilitation Level of Care: The  patient requires daily medical management by a physician with specialized training in physical medicine and rehabilitation for the following reasons: Direction of a multidisciplinary physical rehabilitation program to maximize functional independence : Yes Medical management of patient stability for increased activity during participation in an intensive rehabilitation regime.: Yes Analysis of laboratory values and/or radiology reports with any subsequent need for medication adjustment and/or medical intervention. : Yes   I attest that I was present, lead the team conference, and concur with the assessment and plan of the team.   Fredericka Sober B 07/27/2024, 3:00 PM

## 2024-07-27 NOTE — Progress Notes (Signed)
 Physical Therapy Session Note  Patient Details  Name: Cassidy Bruce MRN: [id number] Date of Birth: [birth year]  Today's Date: 07/27/2024 PT Individual Time: 1345-1450 PT Individual Time Calculation (min): 65 min   Short Term Goals: Week 1:  PT Short Term Goal 1 (Week 1): Pt will perform sit to stand with CGA consistently. PT Short Term Goal 1 - Progress (Week 1): Met PT Short Term Goal 2 (Week 1): Pt will perform bed to chair with CGA consistently. PT Short Term Goal 2 - Progress (Week 1): Met PT Short Term Goal 3 (Week 1): Pt will ambulate x150' with CGA and LRAD. PT Short Term Goal 3 - Progress (Week 1): Met PT Short Term Goal 4 (Week 1): Pt will complete balance assessment. PT Short Term Goal 4 - Progress (Week 1): Met Week 2:  PT Short Term Goal 1 (Week 2): Pt will perform bed to chair with Mod I consistently. PT Short Term Goal 2 (Week 2): Pt will ambulate x150' with consistent supervision and no AD. PT Short Term Goal 3 (Week 2): Pt will perform stair training with supervision and 1 HR. PT Short Term Goal 4 (Week 2): Pt will complete floor transfer with supervision.  Skilled Therapeutic Interventions/Progress Updates:  Patient prone in bed and asleep on entrance to room. Patient alert and agreeable to PT session.   Patient with no pain complaint at start of session.  Therapeutic Activity: Bed Mobility: Pt performed supine <> sit with Mod I. Transfers: Pt performed sit<>stand and stand pivot transfers throughout session with supervision. No cueing provided as she is able to position herself appropriately prior to initiation to stand and sit.  Gait Training:  Focus this session on ambulation quality with strengthening/ activity tolerance. Taken outside for prescribed exposure to sun at least each day.   Guided in gait training over >500 ft from water  fountain, across uneven, paved patio, down ramped drive to entrance, down 4 steps  using R HR only, across street, up 12  steps to H&V uneven bricked patio prior to seated rest break. Discussed discharge situation and pt's needs after leaving. Has not figured out where she will go yet but has option that aunts do not prefer. Ultimately, no matter where she goes, will need to be Mod I d/t limited access to physical assist. Following NMR, pt is able to return ambulate >800 ft from patio back to room with supervision. Cued to change gait speed in attempt to normalize L foot placement with reduced time to focus on foot placement and test motor planning/ control. Can increase speed in small increments.   Neuromuscular Re-ed: NMR facilitated during session with focus on dynamic balance. Pt guided in attempt to skip but is unable to initiate following conscious practice. So guided in jumping. Single jumps initially in order to learn needed trunk control as well as to adjust from landing with foot flat to landing on toes to decrease impact pressure to knees and hips as well as to improve ability to make appropriate balance adjustments. Progressed to bounding with pt able to jump 3x in a row. Several bouts performed with pt able to make small adjustments to quality including arm swing and soft landing.   NMR performed for improvements in motor control and coordination, balance, sequencing, judgement, and self confidence/ efficacy in performing all aspects of mobility at highest level of independence.   Patient seated on EOB at end of session with brakes locked, bed alarm set, and all needs within reach.  Therapy Documentation Precautions:  Precautions Precautions: Fall Recall of Precautions/Restrictions: Intact Restrictions Weight Bearing Restrictions Per Provider Order: No  Pain:  No pain related this session.   Therapy/Group: Individual Therapy  Mliss DELENA Milliner PT, DPT, CSRS 07/27/2024, 6:21 PM

## 2024-07-28 LAB — CBC
HCT: 38.8 % (ref 36.0–46.0)
Hemoglobin: 12.9 g/dL (ref 12.0–15.0)
MCH: 25.7 pg — ABNORMAL LOW (ref 26.0–34.0)
MCHC: 33.2 g/dL (ref 30.0–36.0)
MCV: 77.4 fL — ABNORMAL LOW (ref 80.0–100.0)
Platelets: 98 K/uL — ABNORMAL LOW (ref 150–400)
RBC: 5.01 MIL/uL (ref 3.87–5.11)
RDW: 15 % (ref 11.5–15.5)
WBC: 5.3 K/uL (ref 4.0–10.5)
nRBC: 0 % (ref 0.0–0.2)

## 2024-07-28 LAB — BASIC METABOLIC PANEL WITH GFR
Anion gap: 5 (ref 5–15)
BUN: 7 mg/dL (ref 6–20)
CO2: 25 mmol/L (ref 22–32)
Calcium: 8.8 mg/dL — ABNORMAL LOW (ref 8.9–10.3)
Chloride: 108 mmol/L (ref 98–111)
Creatinine, Ser: 0.93 mg/dL (ref 0.44–1.00)
GFR, Estimated: >60 mL/min (ref >60)
Glucose, Bld: 84 mg/dL (ref 70–99)
Potassium: 4.1 mmol/L (ref 3.5–5.1)
Sodium: 138 mmol/L (ref 135–145)

## 2024-07-28 NOTE — Plan of Care (Signed)
  Problem: Consults Goal: RH STROKE PATIENT EDUCATION Description: See Patient Education module for education specifics  Outcome: Progressing   Problem: RH BOWEL ELIMINATION Goal: RH STG MANAGE BOWEL WITH ASSISTANCE Description: STG Manage Bowel with supervision Assistance. Outcome: Progressing   Problem: RH BLADDER ELIMINATION Goal: RH STG MANAGE BLADDER WITH ASSISTANCE Description: STG Manage Bladder With supervision Assistance Outcome: Progressing   Problem: RH SKIN INTEGRITY Goal: RH STG SKIN FREE OF INFECTION/BREAKDOWN Description: Manage skin free of infection with supervision assistance Outcome: Progressing   Problem: RH SAFETY Goal: RH STG ADHERE TO SAFETY PRECAUTIONS W/ASSISTANCE/DEVICE Description: STG Adhere to Safety Precautions With Assistance/Device. Outcome: Progressing   Problem: RH PAIN MANAGEMENT Goal: RH STG PAIN MANAGED AT OR BELOW PT'S PAIN GOAL Description: <4 w/ prns Outcome: Progressing   Problem: RH KNOWLEDGE DEFICIT Goal: RH STG INCREASE KNOWLEGDE OF HYPERLIPIDEMIA Description: Manage increase knowledge of hyperlipidemia with supervision assistance from friend/ aunt using educational materials provided Outcome: Progressing Goal: RH STG INCREASE KNOWLEDGE OF STROKE PROPHYLAXIS Description: Manage increase knowledge of stroke prophylaxis with supervision assistance from friend/ aunt using educational materials provided Outcome: Progressing

## 2024-07-28 NOTE — Progress Notes (Signed)
 Speech Language Pathology Weekly Progress and Session Note  Patient Details  Name: Cassidy Bruce MRN: [id number] Date of Birth: [birth year]  Beginning of progress report period: July 21, 2024 End of progress report period: July 28, 2024  Today's Date: 07/28/2024 SLP Individual Time: 1100-1200 SLP Individual Time Calculation (min): 60 min  Short Term Goals: Week 2: SLP Short Term Goal 1 (Week 2): Patient will recall and utilize memory strategies with supervision multimodal A SLP Short Term Goal 1 - Progress (Week 2): Met SLP Short Term Goal 2 (Week 2): Patient will consume least restrictive diet with use of safe swallowing strategies given mod i verbal A SLP Short Term Goal 2 - Progress (Week 2): Met SLP Short Term Goal 3 (Week 2): Patient will utilize dysarthria strategies to increase fluency given min multimodal A SLP Short Term Goal 3 - Progress (Week 2): Met SLP Short Term Goal 4 (Week 2): Patient will demonstrate problem solving in functional tasks given supervision multimodal A SLP Short Term Goal 4 - Progress (Week 2): Met  New Short Term Goals: Week 3: SLP Short Term Goal 1 (Week 3): STGs=LTGs d/t ELOS  Weekly Progress Updates: Patient has made excellent progress towards therapy goals meeting 4/4 short term goals set this reporting period. Patient currently benefits from min cues to utilize dysarthria strategies to increase fluency during spoken and read sentence-level speech and supervision level assist for functional problem solving and recall of daily events. She tolerates a regular/thin liquid diet with modI. Patient and family education ongoing. Patient will continue to benefit from skilled therapy services during remainder of CIR stay.     Intensity: Minumum of 1-2 x/day, 30 to 90 minutes Frequency: 3 to 5 out of 7 days Duration/Length of Stay: 10/1 Treatment/Interventions: Cognitive remediation/compensation;Dysphagia/aspiration precaution  training;Internal/external aids;Speech/Language facilitation;Cueing hierarchy;Therapeutic Activities;Functional tasks;Multimodal communication approach;Patient/family education   Daily Session  Skilled Therapeutic Interventions: SLP conducted skilled therapy session targeting communication goals. Patient began with box breathing, focusing on pacing inhalation across entire count during 3-3-3 breathing cycle. Patient completed this exercise with supervision, thus SLP increased difficulty to 4-4-4 breathing cycle. During this modified task, patient required min cues to pace inhalation across entire first 4 counts rather than breathing in rapidly and holding. Patient then completed prolonged /s/ and /z/ hold, achieving smooth and consistent breath and voicing with modI. SLP increased difficulty by adding various vowels in /z/ production, requesting patient utilize concepts of easy onset previously produced. When incorporating vowels, patient required min cues to maintain smooth and even breath flow. She also completed verbal tasks counting 1-10: increasing volume x8, decreasing volume x7, and alternating volume x8 w/ minA cues. SLP then targeted use of fluency strategies across various sentence level tasks where patient controlled word emphasis with minA, sentence prosody with modA, and easy onset with /s/ and /h/ loaded sentences with supervision-minA. Patient was left in room with call bell in reach and alarm set. SLP will continue to target goals per plan of care.        Pain  none  Therapy/Group: Individual Therapy  Nyasha Rahilly, M.A., CCC-SLP  Charmelle Soh A Jermall Isaacson 07/28/2024, 1:01 PM

## 2024-07-28 NOTE — Evaluation (Signed)
 Recreational Therapy Assessment and Plan  Patient Details  Name: Cassidy Bruce MRN: [id number] Date of Birth: Jan 10, 1996 Today's Date: 07/28/2024  Rehab Potential:  Good ELOS:   d/c 10/1  Assessment   Hospital Problem: Principal Problem:   Thromboembolic stroke [facility])     Past Medical History:      Past Medical History:  Diagnosis Date   ADHD     Depression          Past Surgical History:       Past Surgical History:  Procedure Laterality Date   TRANSESOPHAGEAL ECHOCARDIOGRAM (CATH LAB) N/A 07/07/2024    Procedure: TRANSESOPHAGEAL ECHOCARDIOGRAM;  Surgeon: Lonni Slain, MD;  Location: [facility] INVASIVE CV LAB;  Service: Cardiovascular;  Laterality: N/A;          Assessment & Plan Clinical Impression: Patient is a 28 year old right-handed African-American female with past medical history of ADHD/depression. On the prescription medications other than birth control. Per chart review patient lives with a friend. 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 They live in a second floor apartment. Plans to discharge home with friend and 2 aunts with good support. Family is checking on ground availability apartments. Presented 07/05/2024 with dizziness, slurred speech headache and gait abnormality as well as left side weakness. Per family she had went to the gym on Monday, 07/04/2024 for weightlifting as usual no specific problems. Tuesday morning she awoke from sleep with headache and neck pain and by the afternoon patient with increasing dizziness/vertigo as well as slurred speech. Cranial CT scan showed no acute intracranial abnormality new right cerebellar infarct since prior study of 2021 felt to be possibly chronic. 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 Distal V2 segment reconstitution but with attenuated enhancement relative to the contralateral side along the remainder of its course. A 13 mL region of ischemia affecting both cerebellar  hemispheres, worse on the left with no core infarct. MRI identified acute infarcts within the cerebellar vermis and bilateral cerebellar hemispheres. Most notably, large acute infarct present within the superior cerebellar artery territories bilaterally. Posterior fossa mass effect without cerebellar tonsillar herniation or evidence of obstructive hydrocephalus. Petechial hemorrhage within the cerebellar vermis and superior right cerebellar hemisphere. Patient did not receive TNK. No thrombectomy needed for left VA occlusion as BA was patent. Admission chemistries unremarkable except potassium 3.2, glucose 123, ALT 58, CK 284, urine drug screen negative, lactic acid within normal limits. Echocardiogram with ejection fraction of 60 to 65% no regional wall motion abnormalities. EEG negative for seizure. TEE completed bubble study with small PFO, at rest LTR shunting and with Valsalva RTL shunting and plan for follow-up outpatient with cardiology services. Venous Doppler studies negative for DVT. Patient initially placed on heparin  drip 9/8 x 48 hours transition to aspirin  81 mg daily and Plavix  75 mg daily for 3 months then aspirin  alone. Psychiatry consulted 07/13/2024 for history of major depressive disorder. Patient is tolerating a regular consistency diet.   Patient transferred to CIR on 07/13/2024 .    Pt presents with decreased activity tolerance, decreased functional mobility, decreased balance, coordination, hemiplegia, dysarthria, feelings of stress Limiting pt's independence with leisure/community pursuits.  Met with pt today to discuss TR services emphasizing community reintegration.  Purpose and potential goals discussed.  Pt stated understanding and is agreeable to participate in an outing tomorrow morning.  Plan  Min 1 session >60 minutes for community reintegration  Recommendations for other services: Neuropsych  Discharge Criteria: Patient will be discharged  from TR if patient refuses  treatment 3 consecutive times without medical reason.  If treatment goals not met, if there is a change in medical status, if patient makes no progress towards goals or if patient is discharged from hospital.  The above assessment, treatment plan, treatment alternatives and goals were discussed and mutually agreed upon: by patient  [name] 07/28/2024, 11:28 AM

## 2024-07-28 NOTE — Progress Notes (Signed)
 PROGRESS NOTE   Subjective/Complaints:  Was not able to reach aunt Shanda by phone yesterday .  Appreiate SW note No c/o today , discussed d/c date  as well as limited options for f/u PT given lack of insurance.  Discussed possibility or free clinics   ROS: as per HPI. Denies CP, SOB, abd pain, N/V/D, or any other complaints at this time.    Objective:   No results found. Recent Labs    07/28/24 0557  WBC 5.3  HGB 12.9  HCT 38.8  PLT 98*     Recent Labs    07/28/24 0557  NA 138  K 4.1  CL 108  CO2 25  GLUCOSE 84  BUN 7  CREATININE 0.93  CALCIUM  8.8*       Intake/Output Summary (Last 24 hours) at 07/28/2024 0815 Last data filed at 07/28/2024 0732 Gross per 24 hour  Intake 720 ml  Output --  Net 720 ml         Physical Exam: Vital Signs Blood pressure 118/70, pulse 67, temperature 98.3 F (36.8 C), resp. rate 16, height 5' 4 (1.626 m), weight 65.3 kg, SpO2 98%.   General: No acute distress, resting in bed watching tablet.  Mood and affect are appropriate Heart: reg rate today. Regular rhythm no rubs murmurs or extra sounds Lungs: Clear to auscultation, breathing unlabored, no rales or wheezes Abdomen: Positive bowel sounds, soft nontender to palpation, nondistended Extremities: No clubbing, cyanosis, or edema Skin: No evidence of breakdown, no evidence of rash over exposed surfaces.  Neuro:  Patient is alert.  Resting in bed.  Ataxic dysarthria.    PRIOR EXAMS: Neuro: Right upper extremity and right lower extremity 5 out of 5 strength, left upper extremity 4 out of 5 proximal and distal, left lower extremity 4+ out of 5, sensation is intact in all 4 extremities moderate dysmetria LUE FNF and LLE HS Reduced fine motor LUE with inability to oppose finger to thumb    Assessment/Plan: 1. Functional deficits which require 3+ hours per day of interdisciplinary therapy in a comprehensive inpatient  rehab setting. Physiatrist is providing close team supervision and 24 hour management of active medical problems listed below. Physiatrist and rehab team continue to assess barriers to discharge/monitor patient progress toward functional and medical goals  Care Tool:  Bathing    Body parts bathed by patient: Right arm, Left arm, Chest, Abdomen, Front perineal area, Right upper leg, Left upper leg, Right lower leg, Left lower leg, Face, Buttocks   Body parts bathed by helper: Buttocks, Right arm     Bathing assist Assist Level: Supervision/Verbal cueing     Upper Body Dressing/Undressing Upper body dressing   What is the patient wearing?: Pull over shirt    Upper body assist Assist Level: Supervision/Verbal cueing    Lower Body Dressing/Undressing Lower body dressing      What is the patient wearing?: Underwear/pull up, Pants     Lower body assist Assist for lower body dressing: Supervision/Verbal cueing     Toileting Toileting    Toileting assist Assist for toileting: Supervision/Verbal cueing     Transfers Chair/bed transfer  Transfers assist  Chair/bed transfer assist level: Supervision/Verbal cueing     Locomotion Ambulation   Ambulation assist      Assist level: Supervision/Verbal cueing Assistive device: No Device Max distance: 250 ft   Walk 10 feet activity   Assist     Assist level: Supervision/Verbal cueing Assistive device: No Device   Walk 50 feet activity   Assist    Assist level: Supervision/Verbal cueing Assistive device: No Device    Walk 150 feet activity   Assist    Assist level: Supervision/Verbal cueing Assistive device: No Device    Walk 10 feet on uneven surface  activity   Assist     Assist level: Contact Guard/Touching assist Assistive device: Other (comment) (No Device)   Wheelchair     Assist Is the patient using a wheelchair?: No (will not use on discharge and is walking community  distances) Type of Wheelchair: Manual    Wheelchair assist level: Dependent - Patient 0% Max wheelchair distance: 150'    Wheelchair 50 feet with 2 turns activity    Assist        Assist Level: Dependent - Patient 0%   Wheelchair 150 feet activity     Assist      Assist Level: Dependent - Patient 0%   Blood pressure 118/70, pulse 67, temperature 98.3 F (36.8 C), resp. rate 16, height 5' 4 (1.626 m), weight 65.3 kg, SpO2 98%.  Medical Problem List and Plan: 1. 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  Plan repeat CTA 2-3 months to evaluate for improvement dissection             -patient may shower             -ELOS/Goals: 08-03-24  MinA             -Continue CIR PT, OT, SLP ataxia main issue   2.  Antithrombotics: -DVT/anticoagulation:  Pharmaceutical: no tx required amb distance >100' -antiplatelet therapy: continue Aspirin  81 mg daily and Plavix  75 mg daily x 3 months then aspirin  alone   3. Pain Management: continue Oxycodone /Fioricet  as needed, tylenol  PRN   4. Mood/Behavior/Sleep/MDD/ADHD: continue Celexa  20 mg daily.  Provide emotional support.  Psychiatry consulted             -antipsychotic agents: N/A - Patient was seen by psychology 9/11, continue current regimen and call back if further assistance needed.  Psychiatry is signed off -Melatonin PRN   5. Neuropsych/cognition: This patient is not capable of making decisions on her own behalf.  - Patient seen by neuropsychology for depression, appreciate insight   6. Skin/Wound Care: Routine skin checks   7. Fluids/Electrolytes/Nutrition: Routine ins and outs with follow-up chemistries BMET looks good, check Vit D levels-- low at 20.66, started supplementation as below   8.  PFO.  TEE showed positive PFO, at rest LTR shunting and with Valsalva RTL shunting.  Follow-up  outpatient cardiology service for question PFO closure   9.  Hyperlipidemia: per neuro increase Crestor  40 mg daily, also has elevated lipoprotein A    10.  Microcytosis/Thrombocytopenia low normal ferritin.  Placed on iron supplement Hgb nl but plt still on low side, no evidence of bleeding     Latest Ref Rng & Units 07/28/2024    5:57 AM 07/25/2024    5:15 AM 07/21/2024    6:20 AM  CBC  WBC 4.0 - 10.5 K/uL 5.3  5.4  6.7   Hemoglobin 12.0 - 15.0 g/dL 87.0  87.0  87.1   Hematocrit 36.0 - 46.0 % 38.8  38.5  39.0   Platelets 150 - 400 K/uL 98  111  143   May benefit from heme f/u - PLT counts have been mildly low since at least 2020, PCP f/u is the priority   11.  Transaminitis.  Improved, ALT mildly elevated at 47.  Continue to monitor  Recheck 9/15 ordered and ALT still 55  12. Constipation  Last recorded BM 9/19 Will stop Iron supplement for now has had several normal Hgbs   13.  Vitamin D  def- supplement 50, 000 U weekly x 8, rec outside for PT, OT, daily  Has phototherapy UV unit , which can generate 10-25K units of Vit D per day    LOS: 15 days A FACE TO FACE EVALUATION WAS PERFORMED  Prentice FORBES Compton 07/28/2024, 8:15 AM

## 2024-07-28 NOTE — Progress Notes (Addendum)
 Physical Therapy Session Note  Patient Details  Name: Cassidy Bruce Name MRN: [id number] Date of Birth: 1995/11/22  Today's Date: 07/28/2024 PT Individual Time: 1000-1045 and 1300-1354 PT Individual Time Calculation (min): 45 min and 54 min.  Short Term Goals: Week 2:  PT Short Term Goal 1 (Week 2): Pt will perform bed to chair with Mod I consistently. PT Short Term Goal 2 (Week 2): Pt will ambulate x150' with consistent supervision and no AD. PT Short Term Goal 3 (Week 2): Pt will perform stair training with supervision and 1 HR. PT Short Term Goal 4 (Week 2): Pt will complete floor transfer with supervision.  Skilled Therapeutic Interventions/Progress Updates:   First session:  Pt presents sitting EOB and agreeable to therapy.  Pt transfers sit to stand w/ supervision.   Pt amb to small gym w/ supervision.  Tennis shoes retrieved from room and donned w/o assist.  Pt performed NMR:    amb holding green T-ball in a hug   Bouncing ball while walking   Amb holding T-ball under 1 arm and switching to other during gait.   1 step forward and lifting T-ball over head during gait.  Pt w/ occasional LOB? W/ LLE WB but self-correts, maintaining and regaining balance w/o assist.  Pt returned to room and transfers sit to supine w/ mod I.  Bed alarm on and all rails up per pt request, all needs in reach.   Second session:  Pt presents supine in bed and agreeable to therapy.  Pt wheeled to outdoors.  Pt amb on uneven surfaces wearing shoes including:  Ramped sidewalk w/ U-turn Marching Forward /backwards Changing terrain Quick stops/starts Turning head side to side naming objects during gait Obstacle course through car barriers Crossing mulch bed.  Pt returned to room and then requested to use BR.  Pt continent of bladder, charted in Flowsheets.  Pt manages clothing w/ supervision.  Pt returned to bed w/ bed alarm on and all needs in reach.     Therapy Documentation Precautions:   Precautions Precautions: Fall Recall of Precautions/Restrictions: Intact Restrictions Weight Bearing Restrictions Per Provider Order: No General:   Vital Signs:   Pain:0/10 Pain Assessment Pain Scale: 0-10 Pain Score: 0-No pain    Therapy/Group: Individual Therapy  Cassidy Bruce Cassidy Bruce 07/28/2024, 10:49 AM

## 2024-07-28 NOTE — Progress Notes (Signed)
 Occupational Therapy Session Note  Patient Details  Name: Cassidy Bruce MRN: [id number] Date of Birth: February 18, 1996  Today's Date: 07/28/2024 OT Individual Time: 9163-9080 OT Individual Time Calculation (min): 43 min    Short Term Goals: Week 3:  OT Short Term Goal 1 (Week 3): STG= LTG  Skilled Therapeutic Interventions/Progress Updates:  Pt greeted asleep in supine, pt agreeable to OT intervention.      Transfers/bed mobility/functional mobility:  Pt completed bed mobility with supervision. Pt completed sit>stands with no AD and CGA. Pt completed functional ambulation greater than a household distance with no AD and CGA.   Therapeutic activity:  Pt completed standing balance task with pt instructed to use LUE to grasp discs using pincer grasp and balance discs on cylindrical cones with a focus on improved LUE FMC and improved proprioception. Pt completed task with supervision and ++ time d/t ataxia.   Pt completed various functional ambulation tasks with pt instructed to walk while holding various items such as balancing discs on cones or walking with BUEs on tray and balancing leggos on tray. Pt completed both tasks with MIN A for balance, decreased cadence noted during gait. Pt noted to drop discs on cones 4/ 10 times during task.     NMR: pt completed various NMRE tasks in quadruped with a focus on weightbearing into affected L hemi body. Pt completed tasks with CGA for balance and MOD verbal/visual cues for set- up and technique.                 Ended session with pt seated EOB with all needs within reach.    Therapy Documentation Precautions:  Precautions Precautions: Fall Recall of Precautions/Restrictions: Intact Restrictions Weight Bearing Restrictions Per Provider Order: No   Pain: No pain    Therapy/Group: Individual Therapy  Ronal Gift Faye Strohman 07/28/2024, 11:46 AM

## 2024-07-29 NOTE — Progress Notes (Signed)
 PROGRESS NOTE   Subjective/Complaints:  No issues overnite  ROS: as per HPI. Denies CP, SOB, abd pain, N/V/D, or any other complaints at this time.    Objective:   No results found. Recent Labs    07/28/24 0557  WBC 5.3  HGB 12.9  HCT 38.8  PLT 98*     Recent Labs    07/28/24 0557  NA 138  K 4.1  CL 108  CO2 25  GLUCOSE 84  BUN 7  CREATININE 0.93  CALCIUM  8.8*       Intake/Output Summary (Last 24 hours) at 07/29/2024 0739 Last data filed at 07/28/2024 1919 Gross per 24 hour  Intake 600 ml  Output --  Net 600 ml         Physical Exam: Vital Signs Blood pressure 113/64, pulse 67, temperature 97.8 F (36.6 C), resp. rate 20, height 5' 4 (1.626 m), weight 65.3 kg, SpO2 99%.   General: No acute distress, resting in bed watching tablet.  Mood and affect are appropriate Heart: reg rate today. Regular rhythm no rubs murmurs or extra sounds Lungs: Clear to auscultation, breathing unlabored, no rales or wheezes Abdomen: Positive bowel sounds, soft nontender to palpation, nondistended Extremities: No clubbing, cyanosis, or edema Skin: No evidence of breakdown, no evidence of rash over exposed surfaces.  Neuro:  Patient is alert.  Resting in bed.  Ataxic dysarthria.    PRIOR EXAMS: Neuro: Right upper extremity and right lower extremity 5 out of 5 strength, left upper extremity 4 out of 5 proximal and distal, left lower extremity 4+ out of 5, sensation is intact in all 4 extremities moderate dysmetria LUE FNF and LLE HS Reduced fine motor LUE with inability to oppose finger to thumb    Assessment/Plan: 1. Functional deficits which require 3+ hours per day of interdisciplinary therapy in a comprehensive inpatient rehab setting. Physiatrist is providing close team supervision and 24 hour management of active medical problems listed below. Physiatrist and rehab team continue to assess barriers to  discharge/monitor patient progress toward functional and medical goals  Care Tool:  Bathing    Body parts bathed by patient: Right arm, Left arm, Chest, Abdomen, Front perineal area, Right upper leg, Left upper leg, Right lower leg, Left lower leg, Face, Buttocks   Body parts bathed by helper: Buttocks, Right arm     Bathing assist Assist Level: Supervision/Verbal cueing     Upper Body Dressing/Undressing Upper body dressing   What is the patient wearing?: Pull over shirt    Upper body assist Assist Level: Supervision/Verbal cueing    Lower Body Dressing/Undressing Lower body dressing      What is the patient wearing?: Underwear/pull up, Pants     Lower body assist Assist for lower body dressing: Supervision/Verbal cueing     Toileting Toileting    Toileting assist Assist for toileting: Supervision/Verbal cueing     Transfers Chair/bed transfer  Transfers assist     Chair/bed transfer assist level: Supervision/Verbal cueing     Locomotion Ambulation   Ambulation assist      Assist level: Supervision/Verbal cueing Assistive device: No Device Max distance: 100   Walk 10 feet activity  Assist     Assist level: Supervision/Verbal cueing Assistive device: No Device   Walk 50 feet activity   Assist    Assist level: Supervision/Verbal cueing Assistive device: No Device    Walk 150 feet activity   Assist    Assist level: Supervision/Verbal cueing Assistive device: No Device    Walk 10 feet on uneven surface  activity   Assist     Assist level: Contact Guard/Touching assist (outdoors.) Assistive device: Other (comment) (no AD)   Wheelchair     Assist Is the patient using a wheelchair?: No (will not use on discharge and is walking community distances) Type of Wheelchair: Manual    Wheelchair assist level: Dependent - Patient 0% Max wheelchair distance: 150'    Wheelchair 50 feet with 2 turns activity    Assist         Assist Level: Dependent - Patient 0%   Wheelchair 150 feet activity     Assist      Assist Level: Dependent - Patient 0%   Blood pressure 113/64, pulse 67, temperature 97.8 F (36.6 C), resp. rate 20, height 5' 4 (1.626 m), weight 65.3 kg, SpO2 99%.  Medical Problem List and Plan: 1. 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  Plan repeat CTA 2-3 months to evaluate for improvement dissection             -patient may shower             -ELOS/Goals: 08-03-24  MinA             -Continue CIR PT, OT, SLP ataxia main issue   2.  Antithrombotics: -DVT/anticoagulation:  Pharmaceutical: no tx required amb distance >100' -antiplatelet therapy: continue Aspirin  81 mg daily and Plavix  75 mg daily x 3 months then aspirin  alone   3. Pain Management: continue Oxycodone /Fioricet  as needed, tylenol  PRN   4. Mood/Behavior/Sleep/MDD/ADHD: continue Celexa  20 mg daily.  Provide emotional support.  Psychiatry consulted             -antipsychotic agents: N/A - Patient was seen by psychology 9/11, continue current regimen and call back if further assistance needed.  Psychiatry is signed off -Melatonin PRN   5. Neuropsych/cognition: This patient is not capable of making decisions on her own behalf.  - Patient seen by neuropsychology for depression, appreciate insight   6. Skin/Wound Care: Routine skin checks   7. Fluids/Electrolytes/Nutrition: Routine ins and outs with follow-up chemistries BMET looks good, check Vit D levels-- low at 20.66, started supplementation as below   8.  PFO.  TEE showed positive PFO, at rest LTR shunting and with Valsalva RTL shunting.  Follow-up outpatient cardiology service for question PFO closure   9.  Hyperlipidemia: per neuro increase Crestor  40 mg daily, also has elevated lipoprotein A    10.  Microcytosis/Thrombocytopenia low  normal ferritin.  Placed on iron supplement Hgb nl but plt still on low side, no evidence of bleeding     Latest Ref Rng & Units 07/28/2024    5:57 AM 07/25/2024    5:15 AM 07/21/2024    6:20 AM  CBC  WBC 4.0 - 10.5 K/uL 5.3  5.4  6.7   Hemoglobin 12.0 - 15.0 g/dL 87.0  87.0  87.1   Hematocrit 36.0 - 46.0 % 38.8  38.5  39.0   Platelets 150 - 400 K/uL 98  111  143  May benefit from heme f/u - PLT counts have been mildly low since at least 2020, PCP f/u is the priority   11.  Transaminitis.  Improved, ALT mildly elevated at 47.  Continue to monitor  Recheck 9/15 ordered and ALT still 55  12. Constipation  Last recorded BM 9/19 Will stop Iron supplement for now has had several normal Hgbs   13.  Vitamin D  def- supplement 50, 000 U weekly x 8, rec outside for PT, OT, daily  Has phototherapy UV unit , which can generate 10-25K units of Vit D per day    LOS: 16 days A FACE TO FACE EVALUATION WAS PERFORMED  Cassidy Bruce 07/29/2024, 7:39 AM

## 2024-07-29 NOTE — Progress Notes (Signed)
 Recreational Therapy Session Note  Patient Details  Name: Nataliah Hatlestad MRN: [id number] Date of Birth: [birth year] Today's Date: 07/29/2024  Pain: no c/o Skilled Therapeutic Interventions/Progress Updates:  Goal:  Pt will participate in community reintegration ambulatory level without AD at overall CGA for safe mobility.  MET  Pt will utilize speech strategies during conversations with Mod I.  MET   Pt participated in Community reintegration/outing to [employer] at overall supervision level. Pt did experience 2 LOB but able to recover with Mod I.  Goals focused on safe community mobility, identification & negotiation of obstacles, accessing public restroom, energy conservation techniques/education and intelligeable speech.  See outing goal sheet in shadow chart for full details.   Therapy/Group: [employer]  Dajuan Turnley 07/29/2024, 12:03 PM

## 2024-07-29 NOTE — Progress Notes (Signed)
 Speech Language Pathology Daily Session Note  Patient Details  Name: Cassidy Bruce MRN: [id number] Date of Birth: 05-30-1996  Today's Date: 07/29/2024 SLP Individual Time: 8774-8675 SLP Individual Time Calculation (min): 59 min  Short Term Goals: Week 3: SLP Short Term Goal 1 (Week 3): STGs=LTGs d/t ELOS  Skilled Therapeutic Interventions: Skilled therapy session focused on dysphagia and communication goals. SLP facilitated session by observing patient during consumption of regular/thin lunch tray. Patient with timely mastication and complete oral clearance of regular solids. Patient consumed thin liquids via straw with no s/sx of aspiration. Recommend continuation of current diet. SLP targeted communication goals through review and completion of respiratory exercises including 3-3-3 breathing and sustained phonation. Patients average sustained phonation of /s/ was 16 and /z/ was 15.8. Patient completed with supervisionA. SLP then prompted patient to utilize easy onset at the phrase level. Patient required minA to increased fluency and prosody. Patient left in bed with alarm set and call bell in reach. Continue POC.  Pain denies  Therapy/Group: Individual Therapy  [name] M.A., CCC-SLP 07/29/2024, 7:54 AM

## 2024-07-29 NOTE — Progress Notes (Signed)
 Physical Therapy Session Note  Patient Details  Name: Cassidy Bruce MRN: [id number] Date of Birth: 12/11/1995  Today's Date: 07/29/2024 PT Individual Time: 1030-1158 PT Individual Time Calculation (min): 88 min   Short Term Goals: Week 2:  PT Short Term Goal 1 (Week 2): Pt will perform bed to chair with Mod I consistently. PT Short Term Goal 2 (Week 2): Pt will ambulate x150' with consistent supervision and no AD. PT Short Term Goal 3 (Week 2): Pt will perform stair training with supervision and 1 HR. PT Short Term Goal 4 (Week 2): Pt will complete floor transfer with supervision. Week 3:     Skilled Therapeutic Interventions/Progress Updates:  Pt supine in bed on entrance to room. She relates that she has showered and dressed for outing.   Relates no pain at start of session.   Pt participated in Community Reintegration/ Outing to [employer] at overall supervision level using no AD.  Recreation Therapist, Olam Pesa also present throughout. Goals focused on safe community mobility, communication, LUE/ LE functional use, dynamic balance, identification/ remembering list of four items to look for in store & negotiation of store, energy conservation techniques and pt education.  Pt ambulated throughout the store and in the parking lot with supervision, minimal cues for safety awareness (as pt noted to look at smartphone while ambulating wide aisle) and to check-in with self re: energy levels in order to determine need for rest breaks. Educated on typical places within box stores like English as a second language teacher that seating is usually accessible including bathrooms, cafe areas, dressing rooms, furniture aisles, pharmacy, etc...   She is able to remember list of 4 items provided at start of outing: lotion, shoes, Halloween candy, and bread. Prior to starting out in store provided with questioning cues for thoughts required re: layout of store, where items are located and best direction to initiate store  navigation. Pt able to maintain dynamic balance while retrieving items using BUE with supervision. Pt also able to negotiate check out process and scans items, places in bag, chooses correct option for payment all with LUE and without and cueing.  Pt stated appreciation for the outing. All goals for community reintegration met. See outing goal sheet in shadow chart for full details. Pt was left resting while seated EOB with call bell in reach, seatbelt alarm on, and all needs met.     Therapy Documentation Precautions:  Precautions Precautions: Fall Recall of Precautions/Restrictions: Intact Restrictions Weight Bearing Restrictions Per Provider Order: No  Pain: Pain Assessment Pain Scale: 0-10 Pain Score: 0-No pain    Therapy/Group: Individual Therapy  Mliss DELENA Milliner PT, DPT, CSRS 07/29/2024, 12:21 PM

## 2024-07-29 NOTE — Plan of Care (Signed)
  Problem: Consults Goal: RH STROKE PATIENT EDUCATION Description: See Patient Education module for education specifics  Outcome: Progressing   Problem: RH BOWEL ELIMINATION Goal: RH STG MANAGE BOWEL WITH ASSISTANCE Description: STG Manage Bowel with supervision Assistance. Outcome: Progressing   Problem: RH BLADDER ELIMINATION Goal: RH STG MANAGE BLADDER WITH ASSISTANCE Description: STG Manage Bladder With supervision Assistance Outcome: Progressing   Problem: RH SKIN INTEGRITY Goal: RH STG SKIN FREE OF INFECTION/BREAKDOWN Description: Manage skin free of infection with supervision assistance Outcome: Progressing   Problem: RH SAFETY Goal: RH STG ADHERE TO SAFETY PRECAUTIONS W/ASSISTANCE/DEVICE Description: STG Adhere to Safety Precautions With Assistance/Device. Outcome: Progressing   Problem: RH PAIN MANAGEMENT Goal: RH STG PAIN MANAGED AT OR BELOW PT'S PAIN GOAL Description: <4 w/ prns Outcome: Progressing   Problem: RH KNOWLEDGE DEFICIT Goal: RH STG INCREASE KNOWLEGDE OF HYPERLIPIDEMIA Description: Manage increase knowledge of hyperlipidemia with supervision assistance from friend/ aunt using educational materials provided Outcome: Progressing Goal: RH STG INCREASE KNOWLEDGE OF STROKE PROPHYLAXIS Description: Manage increase knowledge of stroke prophylaxis with supervision assistance from friend/ aunt using educational materials provided Outcome: Progressing

## 2024-07-29 NOTE — Progress Notes (Signed)
 Occupational Therapy Session Note  Patient Details  Name: Itali Mckendry MRN: [id number] Date of Birth: [birth year]  Today's Date: 07/29/2024 OT Individual Time: 9199-9155 OT Individual Time Calculation (min): 44 min     Skilled Therapeutic Interventions/Progress Updates: Patient received resting in bed having finished breakfast. Patient agreeable to participating with OT and wanting to get up for a shower. Patient able to gather all items needed for ADL tasks Modified Independent and set up own soap for shower and clothing for after. Ambulated in room without device for gathering items. See ADL list below. Continued treatment with dynamic standing tasks. Patient displayed good safety awareness and motivation to work through LUE apraxia. Continue with skilled OT POC.      Therapy Documentation Precautions:  Precautions Precautions: Fall Recall of Precautions/Restrictions: Intact Restrictions Weight Bearing Restrictions Per Provider Order: No    Pain: 0/10   ADL: ADL Eating: Mod I  Where Assessed-Eating: Bed level Grooming: Mod I Where Assessed-Grooming: standing at sink Upper Body Bathing: distant supervision/  Mod I Where Assessed-Upper Body Bathing: Shower (sitting on TTB) Lower Body Bathing: Supervision/safety/ Mod I Where Assessed-Lower Body Bathing: Shower (sitting on TTB) Upper Body Dressing: Mod I Where Assessed-Upper Body Dressing: Edge of bed Lower Body Dressing: Mod I Where Assessed-Lower Body Dressing: Edge of bed Toileting: Mod I Where Assessed-Toileting: Teacher, adult education: Public affairs consultant Method: Ambulating, Surveyor, minerals: Acupuncturist: Restaurant manager, fast food Method: Designer, industrial/product: Emergency planning/management officer    Therapy/Group: Individual Therapy  [name] 07/29/2024, 12:35 PM

## 2024-07-29 NOTE — Progress Notes (Signed)
 Recreational Therapy Discharge Summary Patient Details  Name: Cassidy Bruce MRN: [id number] Date of Birth: [birth year] Today's Date: 07/29/2024   Comments on progress toward goals: Pt has made excellent progress during LOS and is ready for discharge on 10/1.  Pt referred and participated in community reintegration at overall supervision ambulatory level.  Education included energy conservations, identification and negotiation of obstacles, public restroom access, compensatory strategies.  Pt is looking forward to discharge and returning to previously enjoyed activities as able.  Reasons for discharge: discharge from hospital  Follow-up: Outpatient but uninsured  Patient/family agrees with progress made and goals achieved: Yes  Alixis Harmon 07/29/2024, 12:06 PM

## 2024-07-30 NOTE — Plan of Care (Signed)
  Problem: RH SKIN INTEGRITY Goal: RH STG SKIN FREE OF INFECTION/BREAKDOWN Description: Manage skin free of infection with supervision assistance Outcome: Progressing

## 2024-07-30 NOTE — Plan of Care (Signed)
  Problem: Consults Goal: RH STROKE PATIENT EDUCATION Description: See Patient Education module for education specifics  Outcome: Progressing   Problem: RH BOWEL ELIMINATION Goal: RH STG MANAGE BOWEL WITH ASSISTANCE Description: STG Manage Bowel with supervision Assistance. Outcome: Progressing   Problem: RH BLADDER ELIMINATION Goal: RH STG MANAGE BLADDER WITH ASSISTANCE Description: STG Manage Bladder With supervision Assistance Outcome: Progressing   Problem: RH SKIN INTEGRITY Goal: RH STG SKIN FREE OF INFECTION/BREAKDOWN Description: Manage skin free of infection with supervision assistance Outcome: Progressing   Problem: RH SAFETY Goal: RH STG ADHERE TO SAFETY PRECAUTIONS W/ASSISTANCE/DEVICE Description: STG Adhere to Safety Precautions With Assistance/Device. Outcome: Progressing   Problem: RH PAIN MANAGEMENT Goal: RH STG PAIN MANAGED AT OR BELOW PT'S PAIN GOAL Description: <4 w/ prns Outcome: Progressing   Problem: RH KNOWLEDGE DEFICIT Goal: RH STG INCREASE KNOWLEGDE OF HYPERLIPIDEMIA Description: Manage increase knowledge of hyperlipidemia with supervision assistance from friend/ aunt using educational materials provided Outcome: Progressing Goal: RH STG INCREASE KNOWLEDGE OF STROKE PROPHYLAXIS Description: Manage increase knowledge of stroke prophylaxis with supervision assistance from friend/ aunt using educational materials provided Outcome: Progressing

## 2024-07-30 NOTE — Progress Notes (Signed)
 PROGRESS NOTE   Subjective/Complaints:  Pt doing well, slept well, denies pain, reports LBM this morning but not documented. Urinating fine. No other complaints or concerns.   ROS: as per HPI. Denies CP, SOB, abd pain, N/V/D, or any other complaints at this time.    Objective:   No results found. Recent Labs    07/28/24 0557  WBC 5.3  HGB 12.9  HCT 38.8  PLT 98*     Recent Labs    07/28/24 0557  NA 138  K 4.1  CL 108  CO2 25  GLUCOSE 84  BUN 7  CREATININE 0.93  CALCIUM  8.8*       Intake/Output Summary (Last 24 hours) at 07/30/2024 1019 Last data filed at 07/30/2024 0733 Gross per 24 hour  Intake 838 ml  Output --  Net 838 ml         Physical Exam: Vital Signs Blood pressure 125/87, pulse 65, temperature 98.6 F (37 C), temperature source Oral, resp. rate 20, height 5' 4 (1.626 m), weight 65.3 kg, SpO2 100%.   General: No acute distress, resting in bed watching tablet.  Mood and affect are appropriate Heart: reg rate today. Regular rhythm no rubs murmurs or extra sounds Lungs: Clear to auscultation, breathing unlabored, no rales or wheezes Abdomen: Positive bowel sounds, soft nontender to palpation, nondistended Extremities: No clubbing, cyanosis, or edema Skin: No evidence of breakdown, no evidence of rash over exposed surfaces.  Neuro:  Patient is alert.  Resting in bed.  Ataxic dysarthria.    PRIOR EXAMS: Neuro: Right upper extremity and right lower extremity 5 out of 5 strength, left upper extremity 4 out of 5 proximal and distal, left lower extremity 4+ out of 5, sensation is intact in all 4 extremities moderate dysmetria LUE FNF and LLE HS Reduced fine motor LUE with inability to oppose finger to thumb    Assessment/Plan: 1. Functional deficits which require 3+ hours per day of interdisciplinary therapy in a comprehensive inpatient rehab setting. Physiatrist is providing close team  supervision and 24 hour management of active medical problems listed below. Physiatrist and rehab team continue to assess barriers to discharge/monitor patient progress toward functional and medical goals  Care Tool:  Bathing    Body parts bathed by patient: Right arm, Left arm, Chest, Abdomen, Front perineal area, Right upper leg, Left upper leg, Right lower leg, Left lower leg, Face, Buttocks   Body parts bathed by helper: Buttocks, Right arm     Bathing assist Assist Level: Independent with assistive device Assistive Device Comment: grab bars and shower seat   Upper Body Dressing/Undressing Upper body dressing   What is the patient wearing?: Pull over shirt, Bra    Upper body assist Assist Level: Independent with assistive device Assistive Device Comment: increased time  Lower Body Dressing/Undressing Lower body dressing      What is the patient wearing?: Underwear/pull up, Pants     Lower body assist Assist for lower body dressing: Independent with assitive device Assistive Device Comment: increased time, seated EOB   Toileting Toileting    Toileting assist Assist for toileting: Independent with assistive device Assistive Device Comment: grab bars, raised seat  Transfers Chair/bed transfer  Transfers assist     Chair/bed transfer assist level: Supervision/Verbal cueing     Locomotion Ambulation   Ambulation assist      Assist level: Supervision/Verbal cueing Assistive device: No Device Max distance: 100   Walk 10 feet activity   Assist     Assist level: Supervision/Verbal cueing Assistive device: No Device   Walk 50 feet activity   Assist    Assist level: Supervision/Verbal cueing Assistive device: No Device    Walk 150 feet activity   Assist    Assist level: Supervision/Verbal cueing Assistive device: No Device    Walk 10 feet on uneven surface  activity   Assist     Assist level: Contact Guard/Touching assist  (outdoors.) Assistive device: Other (comment) (no AD)   Wheelchair     Assist Is the patient using a wheelchair?: No (will not use on discharge and is walking community distances) Type of Wheelchair: Manual    Wheelchair assist level: Dependent - Patient 0% Max wheelchair distance: 150'    Wheelchair 50 feet with 2 turns activity    Assist        Assist Level: Dependent - Patient 0%   Wheelchair 150 feet activity     Assist      Assist Level: Dependent - Patient 0%   Blood pressure 125/87, pulse 65, temperature 98.6 F (37 C), temperature source Oral, resp. rate 20, height 5' 4 (1.626 m), weight 65.3 kg, SpO2 100%.  Medical Problem List and Plan: 1. 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  Plan repeat CTA 2-3 months to evaluate for improvement dissection             -patient may shower             -ELOS/Goals: 08-03-24  MinA             -Continue CIR PT, OT, SLP ataxia main issue   2.  Antithrombotics: -DVT/anticoagulation:  Pharmaceutical: no tx required amb distance >100' -antiplatelet therapy: continue Aspirin  81 mg daily and Plavix  75 mg daily x 3 months then aspirin  alone   3. Pain Management: continue Oxycodone /Fioricet  as needed, tylenol  PRN   4. Mood/Behavior/Sleep/MDD/ADHD: continue Celexa  20 mg daily.  Provide emotional support.  Psychiatry consulted             -antipsychotic agents: N/A - Patient was seen by psychology 9/11, continue current regimen and call back if further assistance needed.  Psychiatry is signed off -Melatonin PRN   5. Neuropsych/cognition: This patient is not capable of making decisions on her own behalf.  - Patient seen by neuropsychology for depression, appreciate insight   6. Skin/Wound Care: Routine skin checks   7. Fluids/Electrolytes/Nutrition: Routine ins and outs with follow-up  chemistries BMET looks good, check Vit D levels-- low at 20.66, started supplementation as below   8.  PFO.  TEE showed positive PFO, at rest LTR shunting and with Valsalva RTL shunting.  Follow-up outpatient cardiology service for question PFO closure   9.  Hyperlipidemia: per neuro increase Crestor  40 mg daily, also has elevated lipoprotein A    10.  Microcytosis/Thrombocytopenia low normal ferritin.  Placed on iron supplement Hgb nl but plt still on low side, no evidence of bleeding     Latest Ref Rng & Units 07/28/2024    5:57 AM 07/25/2024    5:15 AM 07/21/2024  6:20 AM  CBC  WBC 4.0 - 10.5 K/uL 5.3  5.4  6.7   Hemoglobin 12.0 - 15.0 g/dL 87.0  87.0  87.1   Hematocrit 36.0 - 46.0 % 38.8  38.5  39.0   Platelets 150 - 400 K/uL 98  111  143   May benefit from heme f/u - PLT counts have been mildly low since at least 2020, PCP f/u is the priority   11.  Transaminitis.  Improved, ALT mildly elevated at 47.  Continue to monitor  Recheck 9/15 ordered and ALT still 55  12. Constipation Will stop Iron supplement for now has had several normal Hgbs -07/30/24 LBM this morning per pt but last documented 2 days ago.    13.  Vitamin D  def- supplement 50, 000 U weekly x 8, rec outside for PT, OT, daily  Has phototherapy UV unit , which can generate 10-25K units of Vit D per day    LOS: 17 days A FACE TO FACE EVALUATION WAS PERFORMED  [address] 07/30/2024, 10:19 AM

## 2024-07-31 NOTE — Progress Notes (Signed)
 PROGRESS NOTE   Subjective/Complaints:  Pt doing well again, slept well, denies pain, LBM this morning. Urinating fine. No other complaints or concerns.   ROS: as per HPI. Denies CP, SOB, abd pain, N/V/D, or any other complaints at this time.    Objective:   No results found. No results for input(s): WBC, HGB, HCT, PLT in the last 72 hours.    No results for input(s): NA, K, CL, CO2, GLUCOSE, BUN, CREATININE, CALCIUM  in the last 72 hours.      Intake/Output Summary (Last 24 hours) at 07/31/2024 1003 Last data filed at 07/31/2024 0800 Gross per 24 hour  Intake 596 ml  Output --  Net 596 ml         Physical Exam: Vital Signs Blood pressure 123/86, pulse 91, temperature 98.8 F (37.1 C), temperature source Oral, resp. rate 16, height 5' 4 (1.626 m), weight 65.3 kg, SpO2 96%.   General: No acute distress, resting in bed watching tablet, dark room.  Mood and affect are appropriate Heart: reg rate today. Regular rhythm no rubs murmurs or extra sounds Lungs: Clear to auscultation, breathing unlabored, no rales or wheezes Abdomen: Positive bowel sounds, soft nontender to palpation, nondistended Extremities: No clubbing, cyanosis, or edema Skin: No evidence of breakdown, no evidence of rash over exposed surfaces.  Neuro:  Patient is alert.  Resting in bed.  Ataxic dysarthria.    PRIOR EXAMS: Neuro: Right upper extremity and right lower extremity 5 out of 5 strength, left upper extremity 4 out of 5 proximal and distal, left lower extremity 4+ out of 5, sensation is intact in all 4 extremities moderate dysmetria LUE FNF and LLE HS Reduced fine motor LUE with inability to oppose finger to thumb    Assessment/Plan: 1. Functional deficits which require 3+ hours per day of interdisciplinary therapy in a comprehensive inpatient rehab setting. Physiatrist is providing close team supervision and 24  hour management of active medical problems listed below. Physiatrist and rehab team continue to assess barriers to discharge/monitor patient progress toward functional and medical goals  Care Tool:  Bathing    Body parts bathed by patient: Right arm, Left arm, Chest, Abdomen, Front perineal area, Right upper leg, Left upper leg, Right lower leg, Left lower leg, Face, Buttocks   Body parts bathed by helper: Buttocks, Right arm     Bathing assist Assist Level: Independent with assistive device Assistive Device Comment: grab bars and shower seat   Upper Body Dressing/Undressing Upper body dressing   What is the patient wearing?: Pull over shirt, Bra    Upper body assist Assist Level: Independent with assistive device Assistive Device Comment: increased time  Lower Body Dressing/Undressing Lower body dressing      What is the patient wearing?: Underwear/pull up, Pants     Lower body assist Assist for lower body dressing: Independent with assitive device Assistive Device Comment: increased time, seated EOB   Toileting Toileting    Toileting assist Assist for toileting: Independent with assistive device Assistive Device Comment: grab bars, raised seat   Transfers Chair/bed transfer  Transfers assist     Chair/bed transfer assist level: Supervision/Verbal cueing     Locomotion  Ambulation   Ambulation assist      Assist level: Supervision/Verbal cueing Assistive device: No Device Max distance: 100   Walk 10 feet activity   Assist     Assist level: Supervision/Verbal cueing Assistive device: No Device   Walk 50 feet activity   Assist    Assist level: Supervision/Verbal cueing Assistive device: No Device    Walk 150 feet activity   Assist    Assist level: Supervision/Verbal cueing Assistive device: No Device    Walk 10 feet on uneven surface  activity   Assist     Assist level: Contact Guard/Touching assist (outdoors.) Assistive  device: Other (comment) (no AD)   Wheelchair     Assist Is the patient using a wheelchair?: No (will not use on discharge and is walking community distances) Type of Wheelchair: Manual    Wheelchair assist level: Dependent - Patient 0% Max wheelchair distance: 150'    Wheelchair 50 feet with 2 turns activity    Assist        Assist Level: Dependent - Patient 0%   Wheelchair 150 feet activity     Assist      Assist Level: Dependent - Patient 0%   Blood pressure 123/86, pulse 91, temperature 98.8 F (37.1 C), temperature source Oral, resp. rate 16, height 5' 4 (1.626 m), weight 65.3 kg, SpO2 96%.  Medical Problem List and Plan: 1. 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  Plan repeat CTA 2-3 months to evaluate for improvement dissection             -patient may shower             -ELOS/Goals: 08-03-24  MinA             -Continue CIR PT, OT, SLP ataxia main issue   2.  Antithrombotics: -DVT/anticoagulation:  Pharmaceutical: no tx required amb distance >100' -antiplatelet therapy: continue Aspirin  81 mg daily and Plavix  75 mg daily x 3 months then aspirin  alone   3. Pain Management: continue Oxycodone /Fioricet  as needed, tylenol  PRN   4. Mood/Behavior/Sleep/MDD/ADHD: continue Celexa  20 mg daily.  Provide emotional support.  Psychiatry consulted             -antipsychotic agents: N/A - Patient was seen by psychology 9/11, continue current regimen and call back if further assistance needed.  Psychiatry is signed off -Melatonin PRN   5. Neuropsych/cognition: This patient is not capable of making decisions on her own behalf.  - Patient seen by neuropsychology for depression, appreciate insight   6. Skin/Wound Care: Routine skin checks   7. Fluids/Electrolytes/Nutrition: Routine ins and outs with follow-up chemistries BMET looks  good, check Vit D levels-- low at 20.66, started supplementation as below   8.  PFO.  TEE showed positive PFO, at rest LTR shunting and with Valsalva RTL shunting.  Follow-up outpatient cardiology service for question PFO closure   9.  Hyperlipidemia: per neuro increase Crestor  40 mg daily, also has elevated lipoprotein A    10.  Microcytosis/Thrombocytopenia low normal ferritin.  Placed on iron supplement Hgb nl but plt still on low side, no evidence of bleeding     Latest Ref Rng & Units 07/28/2024    5:57 AM 07/25/2024    5:15 AM 07/21/2024    6:20 AM  CBC  WBC 4.0 - 10.5 K/uL 5.3  5.4  6.7   Hemoglobin 12.0 - 15.0  g/dL 87.0  87.0  87.1   Hematocrit 36.0 - 46.0 % 38.8  38.5  39.0   Platelets 150 - 400 K/uL 98  111  143   May benefit from heme f/u - PLT counts have been mildly low since at least 2020, PCP f/u is the priority   11.  Transaminitis.  Improved, ALT mildly elevated at 47.  Continue to monitor  Recheck 9/15 ordered and ALT still 55  12. Constipation Will stop Iron supplement for now has had several normal Hgbs -07/31/24 LBM this AM    13.  Vitamin D  def- supplement 50, 000 U weekly x 8, rec outside for PT, OT, daily  Has phototherapy UV unit , which can generate 10-25K units of Vit D per day-- no longer in her room, but would greatly benefit from it! Room is always dark.    LOS: 18 days A FACE TO FACE EVALUATION WAS PERFORMED  [address] 07/31/2024, 10:03 AM

## 2024-07-31 NOTE — Plan of Care (Signed)
  Problem: Consults Goal: RH STROKE PATIENT EDUCATION Description: See Patient Education module for education specifics  Outcome: Progressing   Problem: RH BOWEL ELIMINATION Goal: RH STG MANAGE BOWEL WITH ASSISTANCE Description: STG Manage Bowel with supervision Assistance. Outcome: Progressing   Problem: RH BLADDER ELIMINATION Goal: RH STG MANAGE BLADDER WITH ASSISTANCE Description: STG Manage Bladder With supervision Assistance Outcome: Progressing   Problem: RH SKIN INTEGRITY Goal: RH STG SKIN FREE OF INFECTION/BREAKDOWN Description: Manage skin free of infection with supervision assistance Outcome: Progressing   Problem: RH SAFETY Goal: RH STG ADHERE TO SAFETY PRECAUTIONS W/ASSISTANCE/DEVICE Description: STG Adhere to Safety Precautions With Assistance/Device. Outcome: Progressing   Problem: RH PAIN MANAGEMENT Goal: RH STG PAIN MANAGED AT OR BELOW PT'S PAIN GOAL Description: <4 w/ prns Outcome: Progressing   Problem: RH KNOWLEDGE DEFICIT Goal: RH STG INCREASE KNOWLEGDE OF HYPERLIPIDEMIA Description: Manage increase knowledge of hyperlipidemia with supervision assistance from friend/ aunt using educational materials provided Outcome: Progressing Goal: RH STG INCREASE KNOWLEDGE OF STROKE PROPHYLAXIS Description: Manage increase knowledge of stroke prophylaxis with supervision assistance from friend/ aunt using educational materials provided Outcome: Progressing

## 2024-07-31 NOTE — Plan of Care (Signed)
  Problem: RH BLADDER ELIMINATION Goal: RH STG MANAGE BLADDER WITH ASSISTANCE Description: STG Manage Bladder With supervision Assistance Outcome: Progressing   Problem: RH SKIN INTEGRITY Goal: RH STG SKIN FREE OF INFECTION/BREAKDOWN Description: Manage skin free of infection with supervision assistance Outcome: Progressing

## 2024-07-31 NOTE — Progress Notes (Signed)
 Occupational Therapy Session Note  Patient Details  Name: Cassidy Bruce MRN: [id number] Date of Birth: Sep 23, 1996  Today's Date: 07/31/2024 OT Individual Time: 9054-8954 OT Individual Time Calculation (min): 60 min    Short Term Goals: Week 1:  OT Short Term Goal 1 (Week 1): Pt will complete U/LB bathing with CGA OT Short Term Goal 1 - Progress (Week 1): Met OT Short Term Goal 2 (Week 1): Pt will maintain L UE in safe position during bed mobility with MIN verbal cues OT Short Term Goal 2 - Progress (Week 1): Met OT Short Term Goal 3 (Week 1): Pt will recall hemi dressing techniques with MIN verbal cues OT Short Term Goal 3 - Progress (Week 1): Met OT Short Term Goal 4 (Week 1): Pt will complete shower transfer with CGA using LRAD OT Short Term Goal 4 - Progress (Week 1): Met Week 2:  OT Short Term Goal 1 (Week 2): Pt will compelte laundry while using L hand 50% of the time OT Short Term Goal 1 - Progress (Week 2): Met OT Short Term Goal 2 (Week 2): Pt will complete laundry while maintaining standing balance with SUP and LRAD OT Short Term Goal 2 - Progress (Week 2): Met OT Short Term Goal 3 (Week 2): Pt will complete grooming tasks in standing position with SUP and LRAD OT Short Term Goal 3 - Progress (Week 2): Met Week 3:  OT Short Term Goal 1 (Week 3): STG= LTG  Skilled Therapeutic Interventions/Progress Updates:    1:1 Pt received in the bed sleeping. Pt agreeable to therapy. Pt ambulated in the room with supervision to contact guard. Pt bathed at shower level sit to stand mod I. She returned to EOB and was able to dress sit to stand mod I. Standing at the sink pt completed oral care without assistance.   Pt continues to required supervision to contact guard for dyamic standing balance/ ambulation. With her shoes on and walking in the hallway - needed contact guard at times - left toe would catch.   NMR with focus on left UE coordination with increased degrees of freedom. Pt  picked Dyke out of container and focus on orienting them to hand them on the mirror - vertical surface. Also manipulated clothing to clean up towels from shower from different height surfaces including the floor. Assisted with making the bed and donning clean pillow cases. Also continued practice with orienting and shifted square foam block (in hand manipulation). Also pt practiced tying shoes with success!  Pt ambulated from her room  to the gym and returned to room without ad with supervision to at times contact guard. PT left resting in the bed.   Therapy Documentation Precautions:  Precautions Precautions: Fall Recall of Precautions/Restrictions: Intact Restrictions Weight Bearing Restrictions Per Provider Order: No    Pain: Pain Assessment Pain Scale: 0-10 Pain Score: 0-No pain    Therapy/Group: Individual Therapy  Claudene Nest [facility] 07/31/2024, 10:55 AM

## 2024-08-01 ENCOUNTER — Other Ambulatory Visit (HOSPITAL_COMMUNITY): Payer: Self-pay

## 2024-08-01 LAB — BASIC METABOLIC PANEL WITH GFR
Anion gap: 6 (ref 5–15)
BUN: 8 mg/dL (ref 6–20)
CO2: 23 mmol/L (ref 22–32)
Calcium: 8.8 mg/dL — ABNORMAL LOW (ref 8.9–10.3)
Chloride: 107 mmol/L (ref 98–111)
Creatinine, Ser: 0.99 mg/dL (ref 0.44–1.00)
GFR, Estimated: >60 mL/min (ref >60)
Glucose, Bld: 78 mg/dL (ref 70–99)
Potassium: 3.8 mmol/L (ref 3.5–5.1)
Sodium: 136 mmol/L (ref 135–145)

## 2024-08-01 LAB — CBC
HCT: 38 % (ref 36.0–46.0)
Hemoglobin: 12.8 g/dL (ref 12.0–15.0)
MCH: 25.6 pg — ABNORMAL LOW (ref 26.0–34.0)
MCHC: 33.7 g/dL (ref 30.0–36.0)
MCV: 76 fL — ABNORMAL LOW (ref 80.0–100.0)
Platelets: 93 K/uL — ABNORMAL LOW (ref 150–400)
RBC: 5 MIL/uL (ref 3.87–5.11)
RDW: 14.9 % (ref 11.5–15.5)
WBC: 6.3 K/uL (ref 4.0–10.5)
nRBC: 0 % (ref 0.0–0.2)

## 2024-08-01 NOTE — Progress Notes (Signed)
 Occupational Therapy Session Note  Patient Details  Name: Cassidy Bruce MRN: [id number] Date of Birth: April 18, 1996  Today's Date: 08/01/2024 OT Individual Time: 9154-9060 OT Individual Time Calculation (min): 54 min     Week 2:  OT Short Term Goal 1 (Week 2): Pt will compelte laundry while using L hand 50% of the time OT Short Term Goal 1 - Progress (Week 2): Met OT Short Term Goal 2 (Week 2): Pt will complete laundry while maintaining standing balance with SUP and LRAD OT Short Term Goal 2 - Progress (Week 2): Met OT Short Term Goal 3 (Week 2): Pt will complete grooming tasks in standing position with SUP and LRAD OT Short Term Goal 3 - Progress (Week 2): Met Week 3:  OT Short Term Goal 1 (Week 3): STG= LTG  Skilled Therapeutic Interventions/Progress Updates:    Pt bed level at time of session, sleeping but woken with extended time and verbal encouragement. No pain throughout session. Focus of session on morning ADL with pt performing bed mob Indep, walking throughout room and bathroom with ataxia but overall with MOD I. OT providing supervision for safety but overall no assistance or cues needed. Pt performing shower transfer, UB/LB bathing mostly seated, and drying off Set up only. Pt walking to bed level to get dressed, MOD I and pt gathering her own clothes. Addressing LUE FMC/GMC with tying/untying red theraband for 4 knots, and in hand manipulation for cube with #1-6 going in order for 2 rounds. Back bed level alarm on call bell in reach.   Therapy Documentation Precautions:  Precautions Precautions: Fall Recall of Precautions/Restrictions: Intact Restrictions Weight Bearing Restrictions Per Provider Order: No     Therapy/Group: Individual Therapy  Cassidy Bruce 08/01/2024, 7:26 AM

## 2024-08-01 NOTE — Progress Notes (Addendum)
 Patient ID: Cassidy Bruce, female   DOB: January 24, 1996, 28 y.o.   MRN: [id number] Met with pt to see what is needed at discharge and where is she going at discharge. She does want a shower chair and bedside commode. Have ordered via [employer] to deliver to her room prior to going home on Wednesday. She is either going with friend or to local aunt's home she is deciding today. Will get 30 days of medications via [employer] and still trying to get PCP appointment for pt to follow up with at discharge.   1:33 PM Met with Aunt Claudilla to answer her questions regarding services and options. Aware pt is uninsured and not eligible for OP or HH and still trying to get PCP appointment for her via [facility]. She will go back to DSS to see about getting her connected with services. She plans to do this tomorrow

## 2024-08-01 NOTE — Plan of Care (Signed)
  Problem: RH SAFETY Goal: RH STG ADHERE TO SAFETY PRECAUTIONS W/ASSISTANCE/DEVICE Description: STG Adhere to Safety Precautions With Assistance/Device. Outcome: Progressing   

## 2024-08-01 NOTE — Progress Notes (Signed)
 Physical Therapy Session Note  Patient Details  Name: Cassidy Bruce MRN: [id number] Date of Birth: 1996-11-02  {CHL IP REHAB PT TIME CALCULATION:304800500}  Short Term Goals: {DUH:6958314}  Skilled Therapeutic Interventions/Progress Updates:      Therapy Documentation Precautions:  Precautions Precautions: Fall Recall of Precautions/Restrictions: Intact Restrictions Weight Bearing Restrictions Per Provider Order: No General:   Vital Signs:   Pain:   Mobility:   Locomotion :    Trunk/Postural Assessment :    Balance: Standardized Balance Assessment Standardized Balance Assessment: Berg Balance Test;Functional Gait Assessment Berg Balance Test Sit to Stand: Able to stand without using hands and stabilize independently Standing Unsupported: Able to stand safely 2 minutes Sitting with Back Unsupported but Feet Supported on Floor or Stool: Able to sit safely and securely 2 minutes Stand to Sit: Sits safely with minimal use of hands Transfers: Able to transfer safely, minor use of hands Standing Unsupported with Eyes Closed: Able to stand 10 seconds safely Standing Ubsupported with Feet Together: Able to place feet together independently and stand 1 minute safely From Standing, Reach Forward with Outstretched Arm: Can reach confidently >25 cm (10) From Standing Position, Pick up Object from Floor: Able to pick up shoe safely and easily From Standing Position, Turn to Look Behind Over each Shoulder: Looks behind from both sides and weight shifts well Turn 360 Degrees: Able to turn 360 degrees safely but slowly Standing Unsupported, Alternately Place Feet on Step/Stool: Able to complete 4 steps without aid or supervision Standing Unsupported, One Foot in Front: Able to place foot tandem independently and hold 30 seconds Standing on One Leg: Able to lift leg independently and hold > 10 seconds Total Score: 52 Functional Gait  Assessment Gait Level Surface: Walks 20 ft in  less than 7 sec but greater than 5.5 sec, uses assistive device, slower speed, mild gait deviations, or deviates 6-10 in outside of the 12 in walkway width. Change in Gait Speed: Able to change speed, demonstrates mild gait deviations, deviates 6-10 in outside of the 12 in walkway width, or no gait deviations, unable to achieve a major change in velocity, or uses a change in velocity, or uses an assistive device. Gait with Horizontal Head Turns: Performs head turns smoothly with slight change in gait velocity (eg, minor disruption to smooth gait path), deviates 6-10 in outside 12 in walkway width, or uses an assistive device. Gait with Vertical Head Turns: Performs head turns with no change in gait. Deviates no more than 6 in outside 12 in walkway width. Gait and Pivot Turn: Turns slowly, requires verbal cueing, or requires several small steps to catch balance following turn and stop Step Over Obstacle: Is able to step over one shoe box (4.5 in total height) without changing gait speed. No evidence of imbalance. Gait with Narrow Base of Support: Ambulates 7-9 steps. Gait with Eyes Closed: Walks 20 ft, uses assistive device, slower speed, mild gait deviations, deviates 6-10 in outside 12 in walkway width. Ambulates 20 ft in less than 9 sec but greater than 7 sec. Ambulating Backwards: Walks 20 ft, uses assistive device, slower speed, mild gait deviations, deviates 6-10 in outside 12 in walkway width. Steps: Alternating feet, must use rail. Total Score: 20 FGA comment:: Medium Fall Risk Exercises:   Other Treatments:      Therapy/Group: {Therapy/Group:[id number]}  Cassidy Bruce 08/01/2024, 10:13 AM

## 2024-08-01 NOTE — Progress Notes (Signed)
 PROGRESS NOTE   Subjective/Complaints:  Pt without c/os, appetite good, continent bowel and bladder  ROS: as per HPI. Denies CP, SOB, abd pain, N/V/D, or any other complaints at this time.    Objective:   No results found. Recent Labs    08/01/24 0614  WBC 6.3  HGB 12.8  HCT 38.0  PLT 93*      Recent Labs    08/01/24 0614  NA 136  K 3.8  CL 107  CO2 23  GLUCOSE 78  BUN 8  CREATININE 0.99  CALCIUM  8.8*        Intake/Output Summary (Last 24 hours) at 08/01/2024 0806 Last data filed at 07/31/2024 1800 Gross per 24 hour  Intake 480 ml  Output --  Net 480 ml         Physical Exam: Vital Signs Blood pressure 111/71, pulse 62, temperature 98.3 F (36.8 C), temperature source Oral, resp. rate 20, height 5' 4 (1.626 m), weight 65.3 kg, SpO2 98%.   General: No acute distress, resting in bed watching tablet, dark room.  Mood and affect are appropriate Heart: reg rate today. Regular rhythm no rubs murmurs or extra sounds Lungs: Clear to auscultation, breathing unlabored, no rales or wheezes Abdomen: Positive bowel sounds, soft nontender to palpation, nondistended Extremities: No clubbing, cyanosis, or edema Skin: No evidence of breakdown, no evidence of rash over exposed surfaces.  Neuro:  Patient is alert.  Resting in bed.  Ataxic dysarthria.     Neuro: Right upper extremity and right lower extremity 5 out of 5 strength, left upper extremity 4 out of 5 proximal and distal, left lower extremity 4+ out of 5, sensation is intact in all 4 extremities moderate dysmetria LUE FNF and LLE HS Reduced fine motor LUE with inability to oppose finger to thumb    Assessment/Plan: 1. Functional deficits which require 3+ hours per day of interdisciplinary therapy in a comprehensive inpatient rehab setting. Physiatrist is providing close team supervision and 24 hour management of active medical problems listed  below. Physiatrist and rehab team continue to assess barriers to discharge/monitor patient progress toward functional and medical goals  Care Tool:  Bathing    Body parts bathed by patient: Right arm, Left arm, Chest, Abdomen, Front perineal area, Right upper leg, Left upper leg, Right lower leg, Left lower leg, Face, Buttocks   Body parts bathed by helper: Buttocks, Right arm     Bathing assist Assist Level: Independent with assistive device Assistive Device Comment: grab bars and shower seat   Upper Body Dressing/Undressing Upper body dressing   What is the patient wearing?: Pull over shirt, Bra    Upper body assist Assist Level: Independent with assistive device Assistive Device Comment: increased time  Lower Body Dressing/Undressing Lower body dressing      What is the patient wearing?: Underwear/pull up, Pants     Lower body assist Assist for lower body dressing: Independent with assitive device Assistive Device Comment: increased time, seated EOB   Toileting Toileting    Toileting assist Assist for toileting: Independent with assistive device Assistive Device Comment: grab bars, raised seat   Transfers Chair/bed transfer  Transfers assist  Chair/bed transfer assist level: Supervision/Verbal cueing     Locomotion Ambulation   Ambulation assist      Assist level: Contact Guard/Touching assist Assistive device: No Device Max distance: ~200 feet   Walk 10 feet activity   Assist     Assist level: Supervision/Verbal cueing Assistive device: No Device   Walk 50 feet activity   Assist    Assist level: Supervision/Verbal cueing Assistive device: No Device    Walk 150 feet activity   Assist    Assist level: Supervision/Verbal cueing Assistive device: No Device    Walk 10 feet on uneven surface  activity   Assist     Assist level: Contact Guard/Touching assist (outdoors.) Assistive device: Other (comment) (no AD)    Wheelchair     Assist Is the patient using a wheelchair?: No (will not use on discharge and is walking community distances) Type of Wheelchair: Manual    Wheelchair assist level: Dependent - Patient 0% Max wheelchair distance: 150'    Wheelchair 50 feet with 2 turns activity    Assist        Assist Level: Dependent - Patient 0%   Wheelchair 150 feet activity     Assist      Assist Level: Dependent - Patient 0%   Blood pressure 111/71, pulse 62, temperature 98.3 F (36.8 C), temperature source Oral, resp. rate 20, height 5' 4 (1.626 m), weight 65.3 kg, SpO2 98%.  Medical Problem List and Plan: 1. 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  Plan repeat CTA 2-3 months to evaluate for improvement dissection             -patient may shower             -ELOS/Goals: 08-03-24  MinA             -Continue CIR PT, OT, SLP ataxia main issue   2.  Antithrombotics: -DVT/anticoagulation:  Pharmaceutical: no tx required amb distance >100' -antiplatelet therapy: continue Aspirin  81 mg daily and Plavix  75 mg daily x 3 months then aspirin  alone   3. Pain Management: continue Oxycodone /Fioricet  as needed, tylenol  PRN   4. Mood/Behavior/Sleep/MDD/ADHD: continue Celexa  20 mg daily.  Provide emotional support.  Psychiatry consulted             -antipsychotic agents: N/A - Patient was seen by psychology 9/11, continue current regimen and call back if further assistance needed.  Psychiatry is signed off -Melatonin PRN   5. Neuropsych/cognition: This patient is not capable of making decisions on her own behalf.  - Patient seen by neuropsychology for depression, appreciate insight   6. Skin/Wound Care: Routine skin checks   7. Fluids/Electrolytes/Nutrition: Routine ins and outs with follow-up chemistries BMET looks good, check Vit D levels-- low at  20.66, started supplementation as below   8.  PFO.  TEE showed positive PFO, at rest LTR shunting and with Valsalva RTL shunting.  Follow-up outpatient cardiology service for question PFO closure   9.  Hyperlipidemia: per neuro increase Crestor  40 mg daily, also has elevated lipoprotein A    10.  Microcytosis/Thrombocytopenia low normal ferritin.  Placed on iron supplement Hgb nl but plt still on low side, no evidence of bleeding     Latest Ref Rng & Units 08/01/2024    6:14 AM 07/28/2024    5:57 AM 07/25/2024    5:15 AM  CBC  WBC 4.0 -  10.5 K/uL 6.3  5.3  5.4   Hemoglobin 12.0 - 15.0 g/dL 87.1  87.0  87.0   Hematocrit 36.0 - 46.0 % 38.0  38.8  38.5   Platelets 150 - 400 K/uL 93  98  111   May benefit from heme f/u - PLT counts have been mildly low since at least 2020, PCP f/u is the priority   11.  Transaminitis.  Improved, ALT mildly elevated at 47.  Continue to monitor  Recheck 9/15 ordered and ALT still 55  12. Constipation Will stop Iron supplement for now has had several normal Hgbs -07/31/24 type 6 on 9/28   13.  Vitamin D  def- supplement 50, 000 U weekly x 8, rec outside for PT, OT, daily  Has phototherapy UV unit , which can generate 10-25K units of Vit D per day--   LOS: 19 days A FACE TO FACE EVALUATION WAS PERFORMED  Cassidy Bruce 08/01/2024, 8:06 AM

## 2024-08-01 NOTE — Progress Notes (Signed)
 Physical Therapy Session Note  Patient Details  Name: Cassidy Bruce MRN: [id number] Date of Birth: 1996/10/21  Today's Date: 08/01/2024 PT Individual Time: 1450-1530 PT Individual Time Calculation (min): 40 min   Short Term Goals: Week 2:  PT Short Term Goal 1 (Week 2): Pt will perform bed to chair with Mod I consistently. PT Short Term Goal 2 (Week 2): Pt will ambulate x150' with consistent supervision and no AD. PT Short Term Goal 3 (Week 2): Pt will perform stair training with supervision and 1 HR. PT Short Term Goal 4 (Week 2): Pt will complete floor transfer with supervision.  Skilled Therapeutic Interventions/Progress Updates:    Pt presents in room in bed, agreeable to PT. Pt denies pain. Session focused on NMR for dynamic postural stability, BLE muscle fiber recruitment, BUE/BLE coordination. Pt completes transfers and short distance ambulation modI in room, requires supervision for ambulation in hallway without device. Pt ambulates with supervision to main gym and comes to sitting on EOM. Pt then completes NMR for dynamic standing balance, BUE/BLE cooordination and muscle fiber recruitment with emphasis on volleyball skills (as pt used to play) including: - bumping ball tossed to pt seated x50 with therapist in front of pt and moving from side to side - side stepping holding squat and BUEs in passing position 4x6' - side stepping holding squat and passing ball back and forth with therapist 4x6' - setting ball with BUEs while seated - setting ball with BUEs while holding mini squat, therapist in front of pt and moving from side to side x2 trials Pt returns to room ambulating with supervision in hallway, modI in room, and returns to bed modI at end of session.   Therapy Documentation Precautions:  Precautions Precautions: Fall Recall of Precautions/Restrictions: Intact Restrictions Weight Bearing Restrictions Per Provider Order: No    Therapy/Group: Individual  Therapy  Reche Ohara PT, DPT 08/01/2024, 3:36 PM

## 2024-08-01 NOTE — Progress Notes (Signed)
 Speech Language Pathology Daily Session Note  Patient Details  Name: Cassidy Bruce MRN: [id number] Date of Birth: July 09, 1996  Today's Date: 08/01/2024 SLP Individual Time: 1250-1335 SLP Individual Time Calculation (min): 45 min  Short Term Goals: Week 3: SLP Short Term Goal 1 (Week 3): STGs=LTGs d/t ELOS  Skilled Therapeutic Interventions: Skilled therapy session focused on communication goals. Patient very labile and tearful re: d/c plan. SLP and additional staff member spoke w/ patient regarding pros/cons of each scenario. Patient verbalized concerns and SLP provided emotional support as able. SLP continued to target communication through completion of box breathing exercises x10. Patient completed given mod I. Lastly, SLP reviewed easy onset and patient utilized at the phrase level given supervisionA. Patient left in bed with alarm set and call bell in reach. Continue POC.    Pain None reported   Therapy/Group: Individual Therapy  Tiny Rietz M.A., CCC-SLP 08/01/2024, 7:42 AM

## 2024-08-02 ENCOUNTER — Other Ambulatory Visit (HOSPITAL_COMMUNITY): Payer: Self-pay

## 2024-08-02 MED ORDER — CLOPIDOGREL BISULFATE 75 MG PO TABS
75.0000 mg | ORAL_TABLET | Freq: Every day | ORAL | 0 refills | Status: DC
  Filled 2024-08-02: qty 30, 30d supply, fill #0

## 2024-08-02 MED ORDER — FERROUS SULFATE 325 (65 FE) MG PO TABS
325.0000 mg | ORAL_TABLET | ORAL | 0 refills | Status: DC
  Filled 2024-08-02: qty 15, 30d supply, fill #0

## 2024-08-02 MED ORDER — ROSUVASTATIN CALCIUM 40 MG PO TABS
40.0000 mg | ORAL_TABLET | Freq: Every day | ORAL | 0 refills | Status: DC
  Filled 2024-08-02: qty 30, 30d supply, fill #0

## 2024-08-02 MED ORDER — CITALOPRAM HYDROBROMIDE 20 MG PO TABS
20.0000 mg | ORAL_TABLET | Freq: Every day | ORAL | 0 refills | Status: DC
  Filled 2024-08-02: qty 30, 30d supply, fill #0

## 2024-08-02 MED ORDER — BUTALBITAL-APAP-CAFFEINE 50-325-40 MG PO TABS
1.0000 | ORAL_TABLET | Freq: Three times a day (TID) | ORAL | 0 refills | Status: DC | PRN
  Filled 2024-08-02: qty 10, 4d supply, fill #0

## 2024-08-02 MED ORDER — VITAMIN D (ERGOCALCIFEROL) 1.25 MG (50000 UNIT) PO CAPS
50000.0000 [IU] | ORAL_CAPSULE | ORAL | 0 refills | Status: AC
  Filled 2024-08-02: qty 5, 30d supply, fill #0

## 2024-08-02 MED ORDER — OXYCODONE HCL 5 MG PO TABS
5.0000 mg | ORAL_TABLET | ORAL | 0 refills | Status: AC | PRN
  Filled 2024-08-02: qty 10, 2d supply, fill #0

## 2024-08-02 NOTE — Progress Notes (Signed)
 Physical Therapy Discharge Summary  Patient Details  Name: Cassidy Bruce MRN: [id number] Date of Birth: 05/26/1996  Date of Discharge from PT service:{Time; dates multiple:304500300}  Today's Date: 08/02/2024 PT Individual Time: 0805-0900 PT Individual Time Calculation (min): 55 min    Patient has met {NUMBERS 0-12:18577} of {NUMBERS 0-12:18577} long term goals due to {due un:6958322}.  Patient to discharge at [facility] level {LOA:3049010}.   Patient's care partner {care partner:3041650} to provide the necessary {assistance:3041652} assistance at discharge.  Reasons goals not met: ***  Recommendation:  Patient will benefit from ongoing skilled PT services in {setting:3041680} to continue to advance safe functional mobility, address ongoing impairments in ***, and minimize fall risk.  Equipment: {equipment:3041657}  Reasons for discharge: {Reason for discharge:3049018}  Patient/family agrees with progress made and goals achieved: {Pt/Family agree with progress/goals:3049020}  PT Discharge Precautions/Restrictions Precautions Precautions: Fall Precaution/Restrictions Comments: L hemibody weakness with ataxia Restrictions Weight Bearing Restrictions Per Provider Order: No Vital Signs   Pain Pain Assessment Pain Scale: 0-10 Pain Score: 0-No pain Pain Interference Pain Interference Pain Effect on Sleep: 1. Rarely or not at all Pain Interference with Therapy Activities: 1. Rarely or not at all Pain Interference with Day-to-Day Activities: 1. Rarely or not at all Vision/Perception  Vision - History Ability to See in Adequate Light: 0 Adequate Vision - Assessment Eye Alignment: Within Functional Limits Ocular Range of Motion: Within Functional Limits Alignment/Gaze Preference: Within Defined Limits Tracking/Visual Pursuits: Able to track stimulus in all quads without difficulty Saccades: Within functional limits Convergence: Within functional  limits Perception Perception: Impaired Preception Impairment Details: Inattention/Neglect Perception-Other Comments: mild L inattention Praxis Praxis: Impaired Praxis Impairment Details: Motor planning Praxis-Other Comments: improving significantly from eval  Cognition Overall Cognitive Status: Within Functional Limits for tasks assessed Arousal/Alertness: Awake/alert Orientation Level: Oriented X4 Year: 2025 Month: September Day of Week: Correct Attention: Focused;Sustained Focused Attention: Appears intact Sustained Attention: Appears intact Memory: Appears intact Awareness: Appears intact Problem Solving: Appears intact Self Monitoring: Appears intact Safety/Judgment: Appears intact Sensation Sensation Light Touch: Appears Intact Coordination Gross Motor Movements are Fluid and Coordinated: No Fine Motor Movements are Fluid and Coordinated: No Finger Nose Finger Test: LUE ataxia only, LUE>RUE, continued ataxia but improved from eval Heel Shin Test: able to perform with BLE, requires time and focus to perform slowly with LLE Motor  Motor Motor: Hemiplegia;Ataxia Motor - Discharge Observations: L hemiplegia, LUE ataxia >LLE  Mobility Bed Mobility Bed Mobility: Rolling Right;Rolling Left;Supine to Sit;Sit to Supine Rolling Right: Independent Rolling Left: Independent Supine to Sit: Independent Sit to Supine: Independent Transfers Sit to Stand: Independent with assistive device (extra time) Stand to Sit: Independent with assistive device (extra time) Transfer (Assistive device): None Locomotion  Gait Ambulation: Yes Gait Assistance: Independent;Supervision/Verbal cueing Gait Distance (Feet): 1000 Feet Assistive device: None Gait Assistance Details: Verbal cues for gait pattern;Verbal cues for technique Gait Gait: Yes Gait Pattern: Impaired Gait Pattern: Ataxic Gait velocity: decreased Stairs / Additional Locomotion Stairs: Yes Stairs Assistance:  Supervision/Verbal cueing Stair Management Technique: One rail Right Number of Stairs: 20 Height of Stairs: 6 Ramp: Independent Curb: Supervision/Verbal cueing Wheelchair Mobility Wheelchair Mobility: No  Trunk/Postural Assessment  Cervical Assessment Cervical Assessment: Within Functional Limits Thoracic Assessment Thoracic Assessment: Within Functional Limits Lumbar Assessment Lumbar Assessment: Within Functional Limits Postural Control Trunk Control: decreased control - improved significantly from eval Protective Responses: delayed but improved from eval  Balance Balance Balance Assessed: Yes Standardized Balance Assessment Standardized Balance Assessment: Berg Balance Test;Functional Gait Assessment Berg Balance Test Sit  to Stand: Able to stand without using hands and stabilize independently Standing Unsupported: Able to stand safely 2 minutes Sitting with Back Unsupported but Feet Supported on Floor or Stool: Able to sit safely and securely 2 minutes Stand to Sit: Sits safely with minimal use of hands Transfers: Able to transfer safely, minor use of hands Standing Unsupported with Eyes Closed: Able to stand 10 seconds safely Standing Ubsupported with Feet Together: Able to place feet together independently and stand 1 minute safely From Standing, Reach Forward with Outstretched Arm: Can reach confidently >25 cm (10) From Standing Position, Pick up Object from Floor: Able to pick up shoe safely and easily From Standing Position, Turn to Look Behind Over each Shoulder: Looks behind from both sides and weight shifts well Turn 360 Degrees: Able to turn 360 degrees safely but slowly Standing Unsupported, Alternately Place Feet on Step/Stool: Able to complete 4 steps without aid or supervision Standing Unsupported, One Foot in Front: Able to place foot tandem independently and hold 30 seconds Standing on One Leg: Able to lift leg independently and hold > 10 seconds Total Score:  52 Static Sitting Balance Static Sitting - Balance Support: Feet supported Static Sitting - Level of Assistance: 7: Independent Dynamic Sitting Balance Dynamic Sitting - Balance Support: Feet supported Dynamic Sitting - Level of Assistance: 7: Independent Static Standing Balance Static Standing - Balance Support: During functional activity Static Standing - Level of Assistance: 6: Modified independent (Device/Increase time) (extra time) Dynamic Standing Balance Dynamic Standing - Balance Support: During functional activity Dynamic Standing - Level of Assistance: 6: Modified independent (Device/Increase time) (extra time) Functional Gait  Assessment Gait Level Surface: Walks 20 ft in less than 7 sec but greater than 5.5 sec, uses assistive device, slower speed, mild gait deviations, or deviates 6-10 in outside of the 12 in walkway width. Change in Gait Speed: Able to change speed, demonstrates mild gait deviations, deviates 6-10 in outside of the 12 in walkway width, or no gait deviations, unable to achieve a major change in velocity, or uses a change in velocity, or uses an assistive device. Gait with Horizontal Head Turns: Performs head turns smoothly with slight change in gait velocity (eg, minor disruption to smooth gait path), deviates 6-10 in outside 12 in walkway width, or uses an assistive device. Gait with Vertical Head Turns: Performs head turns with no change in gait. Deviates no more than 6 in outside 12 in walkway width. Gait and Pivot Turn: Turns slowly, requires verbal cueing, or requires several small steps to catch balance following turn and stop Step Over Obstacle: Is able to step over one shoe box (4.5 in total height) without changing gait speed. No evidence of imbalance. Gait with Narrow Base of Support: Ambulates 7-9 steps. Gait with Eyes Closed: Walks 20 ft, uses assistive device, slower speed, mild gait deviations, deviates 6-10 in outside 12 in walkway width. Ambulates 20  ft in less than 9 sec but greater than 7 sec. Ambulating Backwards: Walks 20 ft, uses assistive device, slower speed, mild gait deviations, deviates 6-10 in outside 12 in walkway width. Steps: Alternating feet, must use rail. Total Score: 20 FGA comment:: Medium Fall Risk Extremity Assessment      RLE Assessment RLE Assessment: Within Functional Limits LLE Assessment LLE Assessment: Within Functional Limits  Skilled Intervention: PT instructed pt in Grad day assessment to measure progress toward goals. See below for details. CARETool mobility assessment  also completed; see CAREtool tab in navigator for details.  Patient ***  on entrance to room. Patient alert and agreeable to PT session.   Patient with no pain complaint at start of session.  Therapeutic Activity: Bed Mobility: Pt performed supine <> sit with ***. VC/ tc required for ***. Transfers: Pt performed sit<>stand and stand pivot transfers throughout session with ***. Provided vc/ tc for***.  Gait Training:  Pt ambulated *** ft using *** with ***. Demonstrated ***. Provided vc/ tc for ***.  Wheelchair Mobility:  Pt propelled wheelchair *** feet with ***. Provided vc/ tc for ***.  Neuromuscular Re-ed: NMR facilitated during session with focus on ***. Pt guided in ***. NMR performed for improvements in motor control and coordination, balance, sequencing, judgement, and self confidence/ efficacy in performing all aspects of mobility at highest level of independence.   Therapeutic Exercise: Pt performed the following exercises with vc/ tc for proper technique. ***  Patient *** at end of session with brakes locked, *** alarm set, and all needs within reach.    Mliss DELENA Milliner PT, DPT, CSRS 08/02/2024, 1:01 PM

## 2024-08-02 NOTE — Plan of Care (Signed)
  Problem: RH Balance Goal: LTG Patient will maintain dynamic standing with ADLs (OT) Description: LTG:  Patient will maintain dynamic standing balance with assist during activities of daily living (OT)  Outcome: Completed/Met   Problem: RH Grooming Goal: LTG Patient will perform grooming w/assist,cues/equip (OT) Description: LTG: Patient will perform grooming with assist, with/without cues using equipment (OT) Outcome: Completed/Met   Problem: RH Bathing Goal: LTG Patient will bathe all body parts with assist levels (OT) Description: LTG: Patient will bathe all body parts with assist levels (OT) Outcome: Completed/Met   Problem: RH Dressing Goal: LTG Patient will perform upper body dressing (OT) Description: LTG Patient will perform upper body dressing with assist, with/without cues (OT). Outcome: Completed/Met Goal: LTG Patient will perform lower body dressing w/assist (OT) Description: LTG: Patient will perform lower body dressing with assist, with/without cues in positioning using equipment (OT) Outcome: Completed/Met   Problem: RH Toileting Goal: LTG Patient will perform toileting task (3/3 steps) with assistance level (OT) Description: LTG: Patient will perform toileting task (3/3 steps) with assistance level (OT)  Outcome: Completed/Met   Problem: RH Functional Use of Upper Extremity Goal: LTG Patient will use RT/LT upper extremity as a (OT) Description: LTG: Patient will use right/left upper extremity as a stabilizer/gross assist/diminished/nondominant/dominant level with assist, with/without cues during functional activity (OT) Outcome: Completed/Met   Problem: RH Laundry Goal: LTG Patient will perform laundry w/assist, cues (OT) Description: LTG: Patient will perform laundry with assistance, with/without cues (OT). Outcome: Completed/Met   Problem: RH Toilet Transfers Goal: LTG Patient will perform toilet transfers w/assist (OT) Description: LTG: Patient will perform  toilet transfers with assist, with/without cues using equipment (OT) Outcome: Completed/Met   Problem: RH Tub/Shower Transfers Goal: LTG Patient will perform tub/shower transfers w/assist (OT) Description: LTG: Patient will perform tub/shower transfers with assist, with/without cues using equipment (OT) Outcome: Completed/Met

## 2024-08-02 NOTE — Progress Notes (Signed)
 Inpatient Rehabilitation Discharge Medication Review by a Pharmacist  A complete drug regimen review was completed for this patient to identify any potential clinically significant medication issues.  High Risk Drug Classes Is patient taking? Indication by Medication  Antipsychotic No   Anticoagulant No   Antibiotic No   Opioid Yes PRN Oxycodone  - breakthrough pain PRN Fioricet  - headache not relieved by acetaminophen   Antiplatelet Yes Aspirin  81 mg and Clopidogrel  for 3 months, then Aspirin  alone - CVA prophylaxis  Hypoglycemics/insulin No   Vasoactive Medication No   Chemotherapy No   Other Yes Citalopram  - depression Ferrous sulfate , Vitamin D  - supplements Rosuvastatin  - hyperlipidemia  PRNs: Acetaminophen  - pain, headache     Type of Medication Issue Identified Description of Issue Recommendation(s)  Drug Interaction(s) (clinically significant)     Duplicate Therapy     Allergy     No Medication Administration End Date  Aspirin  81 mg and Clopidogrel  for 3 months, then Aspirin  alone. Begun 9/4. Expect Clopidogrel  to stop on 10/06/24.  Incorrect Dose     Additional Drug Therapy Needed     Significant med changes from prior encounter (inform family/care partners about these prior to discharge). Was only taking PRN Acetaminophen  prior to admit. All meds are new since inpatient admission. Communicate changes with patient/family prior to discharge.  Other       Clinically significant medication issues were identified that warrant physician communication and completion of prescribed/recommended actions by midnight of the next day:  No  Name of provider notified for urgent issues identified:   Provider Method of Notification:    Pharmacist comments:  - Aspirin  18 mg and clopidogrel  for 3 months, then aspirin  alone. [name] 9/4, so expect clopidogrel  to stop on 10/06/24.  - Iron supplement was discontinued on 9/23 due to constipation.  To resume at discharge. Patient would  like to resume per D. Anguilli, PA-C.  Time spent performing this drug regimen review (minutes):  [address]   Cassidy Bruce, [place] 08/02/2024 2:58 PM

## 2024-08-02 NOTE — Progress Notes (Signed)
 Speech Language Pathology Discharge Summary  Patient Details  Name: Cassidy Bruce MRN: [id number] Date of Birth: 09-24-1996  Date of Discharge from SLP service:August 02, 2024  Today's Date: 08/02/2024 SLP Individual Time: 0900-0958 SLP Individual Time Calculation (min): 58 min   Skilled Therapeutic Interventions:   Skilled therapy session focused on re-evaluation of cognition, communication and swallowing. SLP re-evaluated patients cognitive linguistic skills utilizing the [employer]ogn[employer] standardized assessment. On evaluation, patient scored with mild deficits in memory and problem solving. This session, patient scored WFL on all subtests indicative of generalized improvements in cognition compared to admission to CIR. SLP targeted communication and cognition through review of easy onset. SLP encouraged use of easy onset and problem solving skills through completion of complex word description task. Patient required supervisionA to complete task and minA to utilize speech fluency strategies. At the end of the session, SLP observed patient consume regular solids and thin liquids. Patient with complete oral clearance and timely mastication. No s/sx of aspiration. Continue current diet with use of standardized precautions. Patient left in bed with alarm set and call bell in reach. Continue POC     Patient has met 4 of 5 long term goals.  Patient to discharge at overall Modified Independent;Supervision;Min level.  Reasons goals not met: cont to require minA for use of fluency strategies   Clinical Impression/Discharge Summary: Pt has made great gains and has met 4 of 5 LTG's this admission due to improved communication, cognition and swallowing. Pt is currently an overall supervisionA for cognitive tasks and requires mod i cues for utilization of swallowing compensatory strategies to minimize overt s/sx of aspiration with regular/thin diet. Patient with improved ataxic dysarthria, though does require  occasional minA for use of fluency strategies. Pt/family education complete and pt will discharge home with 24 hour supervision from friends/family/etc. Pt would benefit from OP f/u ST services to maximize communication in order to maximize functional independence.   Care Partner:  Caregiver Able to Provide Assistance: Yes  Type of Caregiver Assistance: Physical;Cognitive  Recommendation:  Outpatient SLP  Rationale for SLP Follow Up: Maximize functional communication   Equipment: n/a   Reasons for discharge: Discharged from hospital   Patient/Family Agrees with Progress Made and Goals Achieved: Yes    Raymir Frommelt M.A., CCC-SLP 08/02/2024, 9:46 AM

## 2024-08-02 NOTE — Plan of Care (Signed)
  Problem: RH Swallowing Goal: LTG Patient will consume least restrictive diet using compensatory strategies with assistance (SLP) Description: LTG:  Patient will consume least restrictive diet using compensatory strategies with assistance (SLP) Outcome: Completed/Met   Problem: RH Problem Solving Goal: LTG Patient will demonstrate problem solving for (SLP) Description: LTG:  Patient will demonstrate problem solving for basic/complex daily situations with cues  (SLP) Outcome: Completed/Met   Problem: RH Memory Goal: LTG Patient will use memory compensatory aids to (SLP) Description: LTG:  Patient will use memory compensatory aids to recall biographical/new, daily complex information with cues (SLP) Outcome: Completed/Met   Problem: RH Awareness Goal: LTG: Patient will demonstrate awareness during functional activites type of (SLP) Description: LTG: Patient will demonstrate awareness during functional activites type of (SLP) Outcome: Completed/Met   Problem: RH Expression Communication Goal: LTG Patient will verbally express basic/complex needs(SLP) Description: LTG:  Patient will verbally express basic/complex needs, wants or ideas with cues  (SLP) Outcome: Not Met (cont to require minA for use of strategies

## 2024-08-02 NOTE — Progress Notes (Signed)
 Occupational Therapy Discharge Summary  Patient Details  Name: Cassidy Bruce MRN: [id number] Date of Birth: 1995/11/23  Date of Discharge from OT service:August 02, 2024  Today's Date: 08/02/2024 OT Individual Time: 1420-1530 OT Individual Time Calculation (min): 70 min    Patient has met 10 of 10 long term goals due to improved activity tolerance, improved balance, ability to compensate for deficits, functional use of  LEFT upper and LEFT lower extremity, and improved coordination.  Patient to discharge at overall Modified Independent level.  Patient's care partner is independent to provide the necessary physical assistance at discharge.    Reasons goals not met: NA  Recommendation:  Patient will benefit from ongoing skilled OT services in outpatient setting to continue to advance functional skills in the area of BADL, iADL, and Vocation.  Equipment: BSC, WC, and shower chair  Reasons for discharge: treatment goals met and discharge from hospital  Patient/family agrees with progress made and goals achieved: Yes  OT Discharge Precautions/Restrictions  Precautions Precautions: Fall Recall of Precautions/Restrictions: Intact Precaution/Restrictions Comments: L hemibody weakness with ataxia Restrictions Weight Bearing Restrictions Per Provider Order: No Pain Pain Assessment Pain Scale: 0-10 Pain Score: 0-No pain ADL ADL Eating: Modified independent Where Assessed-Eating: Edge of bed Grooming: Modified independent Where Assessed-Grooming: Standing at sink Upper Body Bathing: Modified independent Where Assessed-Upper Body Bathing: Shower Lower Body Bathing: Modified independent Where Assessed-Lower Body Bathing: Shower Upper Body Dressing: Modified independent (Device) Where Assessed-Upper Body Dressing: Edge of bed Lower Body Dressing: Modified independent Where Assessed-Lower Body Dressing: Edge of bed Toileting: Modified independent Where Assessed-Toileting:  Teacher, adult education: Engineer, agricultural Method: Proofreader: Gaffer: Modified independent Web designer Method: Event organiser: Modified independent Film/video editor Method: Designer, industrial/product: Sales promotion account executive Baseline Vision/History: 0 No visual deficits Patient Visual Report: No change from baseline Eye Alignment: Within Functional Limits Ocular Range of Motion: Within Functional Limits Alignment/Gaze Preference: Within Defined Limits Tracking/Visual Pursuits: Able to track stimulus in all quads without difficulty Saccades: Within functional limits Convergence: Within functional limits Visual Fields: No apparent deficits Perception  Perception: Impaired Perception-Other Comments: Mild L inattention Praxis Praxis: Impaired Praxis Impairment Details: Motor planning Praxis-Other Comments: Improved since evaluation Cognition Cognition Overall Cognitive Status: Within Functional Limits for tasks assessed Arousal/Alertness: Awake/alert Memory: Appears intact Attention: Focused;Sustained Focused Attention: Appears intact Sustained Attention: Appears intact Awareness: Appears intact Problem Solving: Appears intact Self Monitoring: Appears intact Safety/Judgment: Appears intact Brief Interview for Mental Status (BIMS) Repetition of Three Words (First Attempt): 3 Temporal Orientation: Year: Correct Temporal Orientation: Month: Accurate within 5 days Temporal Orientation: Day: Correct Recall: Sock: Yes, no cue required Recall: Blue: Yes, no cue required Recall: Bed: Yes, no cue required BIMS Summary Score: 15 Sensation Sensation Light Touch: Appears Intact Coordination Gross Motor Movements are Fluid and Coordinated: No Fine Motor Movements are Fluid and Coordinated: No Finger Nose Finger Test: LUE ataxia only, LUE>RUE, continued  ataxia but improved from eval Heel Shin Test: able to perform with BLE, requires time and focus to perform slowly with LLE Motor  Motor Motor: Hemiplegia;Ataxia Motor - Discharge Observations: L hemiplegia, LUE ataxia >LLE Mobility  Bed Mobility Bed Mobility: Rolling Right;Rolling Left;Supine to Sit;Sit to Supine Rolling Right: Independent Rolling Left: Independent Supine to Sit: Independent Sit to Supine: Independent Transfers Sit to Stand: Independent with assistive device (extra time) Stand to Sit: Independent with assistive device (extra time)  Trunk/Postural Assessment  Cervical Assessment Cervical Assessment: Within  Functional Limits Thoracic Assessment Thoracic Assessment: Within Functional Limits Lumbar Assessment Lumbar Assessment: Within Functional Limits Postural Control Trunk Control: decreased control - improved significantly from eval Protective Responses: delayed but improved from eval  Balance Balance Balance Assessed: Yes Standardized Balance Assessment Standardized Balance Assessment: Berg Balance Test;Functional Gait Assessment Berg Balance Test Sit to Stand: Able to stand without using hands and stabilize independently Standing Unsupported: Able to stand safely 2 minutes Sitting with Back Unsupported but Feet Supported on Floor or Stool: Able to sit safely and securely 2 minutes Stand to Sit: Sits safely with minimal use of hands Transfers: Able to transfer safely, minor use of hands Standing Unsupported with Eyes Closed: Able to stand 10 seconds safely Standing Ubsupported with Feet Together: Able to place feet together independently and stand 1 minute safely From Standing, Reach Forward with Outstretched Arm: Can reach confidently >25 cm (10) From Standing Position, Pick up Object from Floor: Able to pick up shoe safely and easily From Standing Position, Turn to Look Behind Over each Shoulder: Looks behind from both sides and weight shifts well Turn  360 Degrees: Able to turn 360 degrees safely but slowly Standing Unsupported, Alternately Place Feet on Step/Stool: Able to complete 4 steps without aid or supervision Standing Unsupported, One Foot in Front: Able to place foot tandem independently and hold 30 seconds Standing on One Leg: Able to lift leg independently and hold > 10 seconds Total Score: 52 Static Sitting Balance Static Sitting - Balance Support: Feet supported Static Sitting - Level of Assistance: 7: Independent Dynamic Sitting Balance Dynamic Sitting - Balance Support: Feet supported Dynamic Sitting - Level of Assistance: 7: Independent Static Standing Balance Static Standing - Balance Support: During functional activity Static Standing - Level of Assistance: 6: Modified independent (Device/Increase time) (Extra time) Dynamic Standing Balance Dynamic Standing - Balance Support: During functional activity Dynamic Standing - Level of Assistance: 6: Modified independent (Device/Increase time) (Extra time) Functional Gait  Assessment Gait Level Surface: Walks 20 ft in less than 7 sec but greater than 5.5 sec, uses assistive device, slower speed, mild gait deviations, or deviates 6-10 in outside of the 12 in walkway width. Change in Gait Speed: Able to change speed, demonstrates mild gait deviations, deviates 6-10 in outside of the 12 in walkway width, or no gait deviations, unable to achieve a major change in velocity, or uses a change in velocity, or uses an assistive device. Gait with Horizontal Head Turns: Performs head turns smoothly with slight change in gait velocity (eg, minor disruption to smooth gait path), deviates 6-10 in outside 12 in walkway width, or uses an assistive device. Gait with Vertical Head Turns: Performs head turns with no change in gait. Deviates no more than 6 in outside 12 in walkway width. Gait and Pivot Turn: Turns slowly, requires verbal cueing, or requires several small steps to catch balance  following turn and stop Step Over Obstacle: Is able to step over one shoe box (4.5 in total height) without changing gait speed. No evidence of imbalance. Gait with Narrow Base of Support: Ambulates 7-9 steps. Gait with Eyes Closed: Walks 20 ft, uses assistive device, slower speed, mild gait deviations, deviates 6-10 in outside 12 in walkway width. Ambulates 20 ft in less than 9 sec but greater than 7 sec. Ambulating Backwards: Walks 20 ft, uses assistive device, slower speed, mild gait deviations, deviates 6-10 in outside 12 in walkway width. Steps: Alternating feet, must use rail. Total Score: 20 FGA comment:: Medium Fall Risk  Extremity/Trunk Assessment RUE Assessment RUE Assessment: Within Functional Limits LUE Assessment LUE Assessment: Exceptions to [facility] General Strength Comments: 4+/5 grossly, ataxia/dysmetria,   Skilled Intervention:  Pt greeted resting in bed with no reports of pain. All transfers completed at Mod I level, LB dressing and footwear management in the same fashion. Pt completes Box and Blocks Test, scoring a 14. 9-hole peg test used as modality to address dexterity. HEP reviewed/provided for [facility] activities with use of household items. Tub/shower transfer completed, patient stepping over holding onto tub wall with Mod I. Handout provided for long-handled shower head and anti-skid strips. Pt remained sitting EOB with visitor present.    Nereida Habermann, OTR/L, MSOT  08/02/2024, 2:25 PM

## 2024-08-02 NOTE — Plan of Care (Signed)
  Problem: Consults Goal: RH STROKE PATIENT EDUCATION Description: See Patient Education module for education specifics  Outcome: Progressing   Problem: RH BOWEL ELIMINATION Goal: RH STG MANAGE BOWEL WITH ASSISTANCE Description: STG Manage Bowel with supervision Assistance. Outcome: Progressing   Problem: RH BLADDER ELIMINATION Goal: RH STG MANAGE BLADDER WITH ASSISTANCE Description: STG Manage Bladder With supervision Assistance Outcome: Progressing   Problem: RH SKIN INTEGRITY Goal: RH STG SKIN FREE OF INFECTION/BREAKDOWN Description: Manage skin free of infection with supervision assistance Outcome: Progressing   Problem: RH SAFETY Goal: RH STG ADHERE TO SAFETY PRECAUTIONS W/ASSISTANCE/DEVICE Description: STG Adhere to Safety Precautions With Assistance/Device. Outcome: Progressing   Problem: RH PAIN MANAGEMENT Goal: RH STG PAIN MANAGED AT OR BELOW PT'S PAIN GOAL Description: <4 w/ prns Outcome: Progressing   Problem: RH KNOWLEDGE DEFICIT Goal: RH STG INCREASE KNOWLEGDE OF HYPERLIPIDEMIA Description: Manage increase knowledge of hyperlipidemia with supervision assistance from friend/ aunt using educational materials provided Outcome: Progressing Goal: RH STG INCREASE KNOWLEDGE OF STROKE PROPHYLAXIS Description: Manage increase knowledge of stroke prophylaxis with supervision assistance from friend/ aunt using educational materials provided Outcome: Progressing

## 2024-08-03 ENCOUNTER — Other Ambulatory Visit (HOSPITAL_COMMUNITY): Payer: Self-pay

## 2024-08-03 DIAGNOSIS — D696 Thrombocytopenia, unspecified: Secondary | ICD-10-CM

## 2024-08-03 DIAGNOSIS — E559 Vitamin D deficiency, unspecified: Secondary | ICD-10-CM

## 2024-08-03 DIAGNOSIS — I7774 Dissection of vertebral artery: Secondary | ICD-10-CM

## 2024-08-03 NOTE — Progress Notes (Addendum)
 PROGRESS NOTE   Subjective/Complaints:  Pt without c/os, appetite good, continent bowel and bladder Aunts will pick her up but will go to her home  ROS: as per HPI. Denies CP, SOB, abd pain, N/V/D, or any other complaints at this time.    Objective:   No results found. Recent Labs    08/01/24 0614  WBC 6.3  HGB 12.8  HCT 38.0  PLT 93*      Recent Labs    08/01/24 0614  NA 136  K 3.8  CL 107  CO2 23  GLUCOSE 78  BUN 8  CREATININE 0.99  CALCIUM  8.8*        Intake/Output Summary (Last 24 hours) at 08/03/2024 0832 Last data filed at 08/03/2024 0719 Gross per 24 hour  Intake 896 ml  Output --  Net 896 ml         Physical Exam: Vital Signs Blood pressure 126/76, pulse 66, temperature 98.7 F (37.1 C), temperature source Oral, resp. rate 18, height 5' 4 (1.626 m), weight 65.3 kg, SpO2 98%.   General: No acute distress, resting in bed watching tablet, dark room.  Mood and affect are appropriate Heart: reg rate today. Regular rhythm no rubs murmurs or extra sounds Lungs: Clear to auscultation, breathing unlabored, no rales or wheezes Abdomen: Positive bowel sounds, soft nontender to palpation, nondistended Extremities: No clubbing, cyanosis, or edema Skin: No evidence of breakdown, no evidence of rash over exposed surfaces.  Neuro:  Patient is alert.  Resting in bed.  Ataxic dysarthria.     Neuro: Right upper extremity and right lower extremity 5 out of 5 strength, left upper extremity 4 out of 5 proximal and distal, left lower extremity 4+ out of 5, sensation is intact in all 4 extremities moderate dysmetria LUE FNF and LLE HS Reduced fine motor LUE with inability to oppose finger to thumb    Assessment/Plan: 1. Functional deficits which require 3+ hours per day of interdisciplinary therapy in a comprehensive inpatient rehab setting. Physiatrist is providing close team supervision and 24 hour  management of active medical problems listed below. Physiatrist and rehab team continue to assess barriers to discharge/monitor patient progress toward functional and medical goals  Care Tool:  Bathing    Body parts bathed by patient: Right arm, Left arm, Chest, Abdomen, Front perineal area, Right upper leg, Left upper leg, Right lower leg, Left lower leg, Face, Buttocks   Body parts bathed by helper: Buttocks, Right arm     Bathing assist Assist Level: Independent with assistive device Assistive Device Comment: grab bars and shower seat   Upper Body Dressing/Undressing Upper body dressing   What is the patient wearing?: Pull over shirt, Bra    Upper body assist Assist Level: Independent with assistive device Assistive Device Comment: increased time  Lower Body Dressing/Undressing Lower body dressing      What is the patient wearing?: Underwear/pull up, Pants     Lower body assist Assist for lower body dressing: Independent with assitive device Assistive Device Comment: increased time, seated EOB   Toileting Toileting    Toileting assist Assist for toileting: Independent with assistive device Assistive Device Comment: grab bars, raised  seat   Transfers Chair/bed transfer  Transfers assist     Chair/bed transfer assist level: Supervision/Verbal cueing     Locomotion Ambulation   Ambulation assist      Assist level: Contact Guard/Touching assist Assistive device: No Device Max distance: ~200 feet   Walk 10 feet activity   Assist     Assist level: Supervision/Verbal cueing Assistive device: No Device   Walk 50 feet activity   Assist    Assist level: Supervision/Verbal cueing Assistive device: No Device    Walk 150 feet activity   Assist    Assist level: Supervision/Verbal cueing Assistive device: No Device    Walk 10 feet on uneven surface  activity   Assist     Assist level: Contact Guard/Touching assist (outdoors.) Assistive  device: Other (comment) (no AD)   Wheelchair     Assist Is the patient using a wheelchair?: No (will not use on discharge and is walking community distances) Type of Wheelchair: Manual    Wheelchair assist level: Dependent - Patient 0% Max wheelchair distance: 150'    Wheelchair 50 feet with 2 turns activity    Assist        Assist Level: Dependent - Patient 0%   Wheelchair 150 feet activity     Assist      Assist Level: Dependent - Patient 0%   Blood pressure 126/76, pulse 66, temperature 98.7 F (37.1 C), temperature source Oral, resp. rate 18, height 5' 4 (1.626 m), weight 65.3 kg, SpO2 98%.  Medical Problem List and Plan: 1. 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  Plan repeat CTA 2-3 months to evaluate for improvement dissection             -patient may shower             -ELOS/Goals: 08-03-24  MinA             -Continue CIR PT, OT, SLP ataxia main issue   2.  Antithrombotics: -DVT/anticoagulation:  Pharmaceutical: no tx required amb distance >100' -antiplatelet therapy: continue Aspirin  81 mg daily and Plavix  75 mg daily x 3 months then aspirin  alone   3. Pain Management: continue Oxycodone /Fioricet  as needed, tylenol  PRN   4. Mood/Behavior/Sleep/MDD/ADHD: continue Celexa  20 mg daily.  Provide emotional support.  Psychiatry consulted             -antipsychotic agents: N/A - Patient was seen by psychology 9/11, continue current regimen and call back if further assistance needed.  Psychiatry is signed off -Melatonin PRN   5. Neuropsych/cognition: This patient is capable of making decisions on her own behalf.  - Patient seen by neuropsychology for depression, appreciate insight- Rec Neuropsych f/u as OP    6. Skin/Wound Care: Routine skin checks   7. Fluids/Electrolytes/Nutrition: Routine ins and outs with follow-up  chemistries BMET looks good, check Vit D levels-- low at 20.66, started supplementation as below   8.  PFO.  TEE showed positive PFO, at rest LTR shunting and with Valsalva RTL shunting.  Follow-up outpatient Neuro service for who can refer to Cardiology if they feel it is indicated   9.  Hyperlipidemia: per neuro increase Crestor  40 mg daily, also has elevated lipoprotein A    10.  Microcytosis/Thrombocytopenia low normal ferritin.  Placed on iron supplement Hgb nl but plt still on low side, no evidence of bleeding     Latest Ref Rng &  Units 08/01/2024    6:14 AM 07/28/2024    5:57 AM 07/25/2024    5:15 AM  CBC  WBC 4.0 - 10.5 K/uL 6.3  5.3  5.4   Hemoglobin 12.0 - 15.0 g/dL 87.1  87.0  87.0   Hematocrit 36.0 - 46.0 % 38.0  38.8  38.5   Platelets 150 - 400 K/uL 93  98  111   May benefit from heme f/u - PLT counts have been mildly low since at least 2020, PCP f/u is the priority   11.  Transaminitis.  Improved, ALT mildly elevated at 47.  Continue to monitor  Recheck 9/15 ordered and ALT still 55  12. Constipation Will stop Iron supplement for now has had several normal Hgbs -07/31/24 type 6 on 9/28   13.  Vitamin D  def- supplement 50, 000 U weekly x 8, Recommend outside without sunscreen per day   LOS: 21 days A FACE TO FACE EVALUATION WAS PERFORMED  Prentice FORBES Compton 08/03/2024, 8:33 AM

## 2024-08-03 NOTE — Plan of Care (Signed)
  Problem: RH Balance Goal: LTG Patient will maintain dynamic sitting balance (PT) Description: LTG:  Patient will maintain dynamic sitting balance with assistance during mobility activities (PT) Outcome: Completed/Met Flowsheets (Taken 08/02/2024 1652) LTG: Pt will maintain dynamic sitting balance during mobility activities with:: Independent Goal: LTG Patient will maintain dynamic standing balance (PT) Description: LTG:  Patient will maintain dynamic standing balance with assistance during mobility activities (PT) Outcome: Completed/Met Flowsheets (Taken 08/02/2024 1652) LTG: Pt will maintain dynamic standing balance during mobility activities with:: Independent with assistive device    Problem: Sit to Stand Goal: LTG:  Patient will perform sit to stand with assistance level (PT) Description: LTG:  Patient will perform sit to stand with assistance level (PT) Outcome: Completed/Met Flowsheets (Taken 08/02/2024 1652) LTG: PT will perform sit to stand in preparation for functional mobility with assistance level: Independent with assistive device   Problem: RH Bed Mobility Goal: LTG Patient will perform bed mobility with assist (PT) Description: LTG: Patient will perform bed mobility with assistance, with/without cues (PT). Outcome: Completed/Met Flowsheets (Taken 08/02/2024 1652) LTG: Pt will perform bed mobility with assistance level of: Independent   Problem: RH Bed to Chair Transfers Goal: LTG Patient will perform bed/chair transfers w/assist (PT) Description: LTG: Patient will perform bed to chair transfers with assistance (PT). Outcome: Completed/Met Flowsheets (Taken 08/02/2024 1652) LTG: Pt will perform Bed to Chair Transfers with assistance level: Independent with assistive device    Problem: RH Car Transfers Goal: LTG Patient will perform car transfers with assist (PT) Description: LTG: Patient will perform car transfers with assistance (PT). Outcome: Completed/Met Flowsheets  (Taken 07/14/2024 1714 by Dasie Elsie BROCKS, PT) LTG: Pt will perform car transfers with assist:: Supervision/Verbal cueing   Problem: RH Ambulation Goal: LTG Patient will ambulate in controlled environment (PT) Description: LTG: Patient will ambulate in a controlled environment, # of feet with assistance (PT). Outcome: Completed/Met Flowsheets Taken 08/02/2024 1652 by Thaddeus Mliss LABOR, PT LTG: Pt will ambulate in controlled environ  assist needed:: Independent with assistive device Taken 07/14/2024 1714 by Dasie Elsie BROCKS, PT LTG: Ambulation distance in controlled environment: 150' with LRAD Goal: LTG Patient will ambulate in home environment (PT) Description: LTG: Patient will ambulate in home environment, # of feet with assistance (PT). Outcome: Completed/Met Flowsheets Taken 08/02/2024 1652 by Thaddeus Mliss LABOR, PT LTG: Pt will ambulate in home environ  assist needed:: Independent with assistive device Taken 07/14/2024 1714 by Dasie Elsie BROCKS, PT LTG: Ambulation distance in home environment: 54' with LRAD   Problem: RH Stairs Goal: LTG Patient will ambulate up and down stairs w/assist (PT) Description: LTG: Patient will ambulate up and down # of stairs with assistance (PT) Outcome: Completed/Met Flowsheets (Taken 07/14/2024 1714 by Dasie Elsie BROCKS, PT) LTG: Pt will ambulate up/down stairs assist needed:: Supervision/Verbal cueing LTG: Pt will  ambulate up and down number of stairs: one flight, per home setup

## 2024-08-03 NOTE — Progress Notes (Signed)
 Inpatient Rehabilitation Care Coordinator Discharge Note   Patient Details  Name: Cassidy Bruce MRN: [id number] Date of Birth: 11-18-1995   Discharge location: HOME WITH AUNT WHO IS AWARE OF HER NEED FOR SUPERVISION  Length of Stay: 21 DAYS  Discharge activity level: MOD/I-SUPERVISION  Home/community participation: ACTIVE  Patient response un:Yzjouy Literacy - How often do you need to have someone help you when you read instructions, pamphlets, or other written material from your doctor or pharmacy?: Never  Patient response un:Dnrpjo Isolation - How often do you feel lonely or isolated from those around you?: Rarely  Services provided included: MD, RD, PT, OT, SLP, RN, CM, TR, Pharmacy, Neuropsych, SW  Financial Services:  Field seismologist Utilized: Other (Comment) (PENDING MEDICAID)    Choices offered to/list presented to: NA  Follow-up services arranged:  DME, Patient/Family has no preference for HH/DME agencies      DME : [facility] AND [facility]   MATCH IN PLACE FOR 30 DAYS OF MEDICATIONS COULD NOT GET INTO ANY [facility] DUE TO NOT TAKING NEW PATIENTS OR TOO FAR OUT TO SEE.  SSD/SSI APPLIED FOR WHILE HERE. CONTACT IS [facility] [phone] MEDICAID APPLIED FOR AND CONTACT IS [facility] [phone] TO CHECK STATUS INFO GIVEN TO AUNT AND PLACED ON AVS PATIENT DID NOT WANT MENTAL HEALTH RESOURCES FELT NOT NEEDED. Patient response to transportation need: Is the patient able to respond to transportation needs?: Yes In the past 12 months, has lack of transportation kept you from medical appointments or from getting medications?: No In the past 12 months, has lack of transportation kept you from meetings, work, or from getting things needed for daily living?: No   Patient/Family verbalized understanding of follow-up arrangements:  Yes  Individual responsible for coordination of the follow-up plan: [name] [phone]   [name] [phone]  Confirmed correct DME delivered: Cassidy Bruce 08/03/2024    Comments (or additional information):BOTH AUNTS HAVE BEEN HERE AND SEEN PT IN THERAPIES. SHE HAS DONE WELL HERE AND MADE GOOD PROGRESS. SHE STILL NEEDS SUPERVISION. SINCE UNINSURED CAN GET FOLLOW UP THERAPIES. PT AT HIGH RISK TO RE-ADMIT DUE TO NOT FOLLOWING RECOMMENDATIONS OR NOT RESPECTING HER AUNTS WHO ARE WILLING TO ASSIST HER.   Summary of Stay    Date/Time Discharge Planning CSW  07/27/24 0901 Local aunt working on housing for pt, but informed goals are supervision level. Pt plans to go back with roommate. Applying for SSD today at 11;00. Medicaid application pending. Aunt's need to come up with a plan. Not an option for NHP-uninsured and young. RGD  07/20/24 0837 Will need to be mod/i does not have a caregiver-roommate may be there or not and one aunt lives in [place] and other here, have not seen. Does not really have a dc plan. RGD       Cassidy Bruce

## 2024-08-05 ENCOUNTER — Telehealth: Payer: Self-pay | Admitting: *Deleted

## 2024-08-05 NOTE — Telephone Encounter (Signed)
 Transition Care Management Unsuccessful Follow-up Telephone Call  Date of discharge and from where:  [facility] 08/03/24  Attempts:  1st Attempt  Reason for unsuccessful TCM follow-up call:  Left voice message with point of contact Aunt Cassidy Bruce with request to call our office to answer a few questions about how Cassidy Bruce is doing and confirm appointments. I left appt date and time and alerted to use MyChart for appointments scheduled.

## 2024-08-06 NOTE — Progress Notes (Unsigned)
 Cardiology Office Note:   Date:  08/09/2024  ID:  Cassidy Bruce, DOB 11/11/1995, MRN [id number] PCP:  Freddrick Johns  [facility] Providers Cardiologist:  Wendel Haws, MD Referring MD: No ref. provider found  Chief Complaint/Reason for Referral:  PFO/CVA ASSESSMENT:    1. Cerebrovascular accident (CVA) due to occlusion of left vertebral artery (HCC)   2. PFO (patent foramen ovale)   3. Vertebral artery dissection   4. Hyperlipidemia LDL goal <55     PLAN:   In order of problems listed above: CVA: Evaluation has been thus far negative and highly suggestive that the PFO is implicated in her presentation.  Will place a 2-week monitor to exclude atrial fibrillation.  Will have patient return to discuss potential closure and 1 month.  She is still recovering from her stroke and was discharged just about a week ago.  Continue DAPT with aspirin  81 mg and Plavix  75 mg for a total of 3 weeks then discontinue Plavix .  Continue rosuvastatin  40 mg PFO: See discussion above Vertebral artery dissection: Seen on the left side and not thought to be the cause of her bilateral infarctions.  This certainly be related to her left sided infarction.  There is some mention of her exercising and straining which may have elicited this dissection.  Will reach out to neurology to discuss further Hyperlipidemia: Given history of stroke goal LDL is less than 55.  Continue rosuvastatin  40 mg.            Dispo:  Return in about 4 weeks (around 09/06/2024).       I spent 42 minutes reviewing all clinical data during and prior to this visit including all relevant imaging studies, laboratories, clinical information from other health systems and prior notes from both Cardiology and other specialties, interviewing the patient, conducting a complete physical examination, and coordinating care in order to formulate a comprehensive and personalized evaluation and treatment plan.   History of Present Illness:     FOCUSED PROBLEM LIST:   Dysarthria/ataxia September 2025 R cerebellar infarction Head CT  Bilateral cerebellar infarctions L>R Head MRI L vertebral dissection Head/neck CTA Not thought to be cause of CVA presentation PFO with bidirectional shunting, EF 60 to 65% TEE [name] grade 3 PFO TCD No DVT LE dopplers Negative hypercoagulable panel ROPE score 10 ADHD Depression BMI 26 August 2024:  Patient consents to use of AI scribe. The patient is a 28 year old female with evaluation of problems who presented with an acute stroke last month.  Patient was in her normal state of health up until the day of presentation.  She developed what was initially thought is seizure-like activity, ataxia, and dysarthria.  She was diagnosed with bilateral cerebellar infarctions.  In the course of her workup a TEE demonstrated a small PFO and a TCD was positive.  Her CTA demonstrated no large vessel occlusions but did demonstrate a vertebral artery dissection on the left side with reconstitution.  Was not thought that this finding was related to her presentation given the bilateral infarctions seen.  Her hypercoagulable workup was negative.  She is ultimately discharged to rehabilitation and later discharged home.  She is here to discuss further management of her PFO in the context of a stroke.  She is not currently undergoing speech or physical therapy but plans to seek therapy services now that she has insurance. 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  She reports some residual weakness in  her left arm and slight difficulty walking. She is right-handed. Her family notes that she sometimes carries a heavy bag, which exacerbates her symptoms, but she is learning to manage her limitations.  She is currently taking aspirin  and Plavix . No palpitations or issues with her medications since being discharged.  She lives in [place] with her family, who are actively  involved in her care. They are coordinating her visits and ensuring she remains in [place] for her recovery.     Current Medications: Current Meds  Medication Sig   acetaminophen  (TYLENOL ) 500 MG tablet Take 500 mg by mouth every 6 (six) hours as needed.   aspirin  EC 81 MG tablet Take 1 tablet (81 mg total) by mouth daily. Swallow whole.   butalbital -acetaminophen -caffeine  (FIORICET ) 50-325-40 MG tablet Take 1 tablet by mouth every 8 (eight) hours as needed for headache (if not effective with tylenol ).   citalopram  (CELEXA ) 20 MG tablet Take 1 tablet (20 mg total) by mouth daily.   clopidogrel  (PLAVIX ) 75 MG tablet Take 1 tablet (75 mg total) by mouth daily.   ferrous sulfate  325 (65 FE) MG tablet Take 1 tablet (325 mg total) by mouth every other day.   oxyCODONE  (OXY IR/ROXICODONE ) 5 MG immediate release tablet Take 1 tablet (5 mg total) by mouth every 4 (four) hours as needed for breakthrough pain.   rosuvastatin  (CRESTOR ) 40 MG tablet Take 1 tablet (40 mg total) by mouth daily.   Vitamin D , Ergocalciferol , (DRISDOL ) 1.25 MG (50000 UNIT) CAPS capsule Take 1 capsule (50,000 Units total) by mouth every 7 (seven) days.     Review of Systems:   Please see the history of present illness.    All other systems reviewed and are negative.     EKGs/Labs/Other Test Reviewed:   EKG:  2025 NSR  EKG Interpretation Date/Time:    Ventricular Rate:    PR Interval:    QRS Duration:    QT Interval:    QTC Calculation:   R Axis:      Text Interpretation:          CARDIAC STUDIES: Refer to CV Procedures and Imaging Tabs   Risk Assessment/Calculations:          Physical Exam:   VS:  BP 118/88   Pulse (!) 105   Ht 5' 4 (1.626 m)   Wt 140 lb (63.5 kg)   SpO2 96%   BMI 24.03 kg/m        Wt Readings from Last 3 Encounters:  08/09/24 140 lb (63.5 kg)  07/13/24 143 lb 15.4 oz (65.3 kg)  07/11/24 145 lb (65.8 kg)      GENERAL:  No apparent distress, AOx3 HEENT:  No carotid  bruits, +2 carotid impulses, no scleral icterus CAR: RRR no murmurs, gallops, rubs, or thrills RES:  Clear to auscultation bilaterally ABD:  Soft, nontender, nondistended, positive bowel sounds x 4 VASC:  +2 radial pulses, +2 carotid pulses NEURO:  CN 2-12 grossly intact; motor and sensory grossly intact PSYCH:  No active depression or anxiety EXT:  No edema, ecchymosis, or cyanosis  Signed, Arieanna Pressey K Jonothan Heberle, MD  08/09/2024 11:45 AM    [facility] [address] [place], [place], [place]  [address] Phone: [phone]; Fax: [phone]   Note:  This document was prepared using Dragon voice recognition software and may include unintentional dictation errors.

## 2024-08-09 ENCOUNTER — Encounter: Payer: Self-pay | Admitting: Internal Medicine

## 2024-08-09 ENCOUNTER — Ambulatory Visit: Payer: MEDICAID

## 2024-08-09 ENCOUNTER — Ambulatory Visit: Payer: MEDICAID | Attending: Internal Medicine | Admitting: Internal Medicine

## 2024-08-09 VITALS — BP 118/88 | HR 105 | Ht 64.0 in | Wt 140.0 lb

## 2024-08-09 DIAGNOSIS — I7774 Dissection of vertebral artery: Secondary | ICD-10-CM

## 2024-08-09 DIAGNOSIS — Q2112 Patent foramen ovale: Secondary | ICD-10-CM

## 2024-08-09 DIAGNOSIS — I63212 Cerebral infarction due to unspecified occlusion or stenosis of left vertebral arteries: Secondary | ICD-10-CM

## 2024-08-09 DIAGNOSIS — E785 Hyperlipidemia, unspecified: Secondary | ICD-10-CM

## 2024-08-09 NOTE — Progress Notes (Unsigned)
 Enrolled patient for a 14 day Zio XT  monitor to be mailed to patients home

## 2024-08-09 NOTE — Patient Instructions (Signed)
 Medication Instructions:  Your physician recommends that you continue on your current medications as directed. Please refer to the Current Medication list given to you today.  *If you need a refill on your cardiac medications before your next appointment, please call your pharmacy*  Lab Work: NONE If you have labs (blood work) drawn today and your tests are completely normal, you will receive your results only by: MyChart Message (if you have MyChart) OR A paper copy in the mail If you have any lab test that is abnormal or we need to change your treatment, we will call you to review the results.  Testing/Procedures: Your physician has requested that you wear a heart monitor.   Follow-Up: At [facility], you and your health needs are our priority.  As part of our continuing mission to provide you with exceptional heart care, our providers are all part of one team.  This team includes your primary Cardiologist (physician) and Advanced Practice Providers or APPs (Physician Assistants and Nurse Practitioners) who all work together to provide you with the care you need, when you need it.  Your next appointment:   1 month(s)  Provider:   Lurena Red, MD    Other Instructions GEOFFRY HEWS- Long Term Monitor Instructions  Your physician has requested you wear a ZIO patch monitor for 14 days.  This is a single patch monitor. [employer] supplies one patch monitor per enrollment. Additional stickers are not available. Please do not apply patch if you will be having a Nuclear Stress Test,  Echocardiogram, Cardiac CT, MRI, or Chest Xray during the period you would be wearing the  monitor. The patch cannot be worn during these tests. You cannot remove and re-apply the  ZIO XT patch monitor.  Your ZIO patch monitor will be mailed 3 day USPS to your address on file. It may take 3-5 days  to receive your monitor after you have been enrolled.  Once you have received your monitor, please review  the enclosed instructions. Your monitor  has already been registered assigning a specific monitor serial # to you.  Billing and Patient Assistance Program Information  We have supplied [employer] with any of your insurance information on file for billing purposes. [employer] offers a sliding scale Patient Assistance Program for patients that do not have  insurance, or whose insurance does not completely cover the cost of the ZIO monitor.  You must apply for the Patient Assistance Program to qualify for this discounted rate.  To apply, please call [employer] at [phone], select option 4, select option 2, ask to apply for  Patient Assistance Program. Meredeth will ask your household income, and how many people  are in your household. They will quote your out-of-pocket cost based on that information.  [employer] will also be able to set up a 79-month, interest-free payment plan if needed.  Applying the monitor   Shave hair from upper left chest.  Hold abrader disc by orange tab. Rub abrader in 40 strokes over the upper left chest as  indicated in your monitor instructions.  Clean area with 4 enclosed alcohol pads. Let dry.  Apply patch as indicated in monitor instructions. Patch will be placed under collarbone on left  side of chest with arrow pointing upward.  Rub patch adhesive wings for 2 minutes. Remove white label marked 1. Remove the white  label marked 2. Rub patch adhesive wings for 2 additional minutes.  While looking in a mirror, press and release button in center of  patch. A small green light will  flash 3-4 times. This will be your only indicator that the monitor has been turned on.  Do not shower for the first 24 hours. You may shower after the first 24 hours.  Press the button if you feel a symptom. You will hear a small click. Record Date, Time and  Symptom in the Patient Logbook.  When you are ready to remove the patch, follow instructions on the last 2 pages of Patient   Logbook. Stick patch monitor onto the last page of Patient Logbook.  Place Patient Logbook in the blue and white box. Use locking tab on box and tape box closed  securely. The blue and white box has prepaid postage on it. Please place it in the mailbox as  soon as possible. Your physician should have your test results approximately 7 days after the  monitor has been mailed back to [facility].  Call [facility] Customer Care at [phone] if you have questions regarding  your ZIO XT patch monitor. Call them immediately if you see an orange light blinking on your  monitor.  If your monitor falls off in less than 4 days, contact our Monitor department at [phone].  If your monitor becomes loose or falls off after 4 days call [employer] at [phone] for  suggestions on securing your monitor

## 2024-08-10 ENCOUNTER — Telehealth: Payer: Self-pay | Admitting: Licensed Clinical Social Worker

## 2024-08-10 NOTE — Telephone Encounter (Signed)
 H&V Care Navigation CSW Progress Note  Clinical Social Worker contacted patient by phone to f/u on referral for self pay, no PCP. Was able to leave a message at [phone]. This was returned. Introduced self, role, reason for call. Confirmed home address and DOB. Pt residing with her aunt, she shares she is aware of her Medicaid and disability applications pending. Encouraged her to monitor her mail for any updates/additional documents needed. I will follow as able. Pt otherwise doing okay, has her medications and access to safe housing/transportation. I will mail her my card and Stroke Support Group at [facility] to consider. Will f/u to ensure no additional questions/concerns or to provide any updates I may note. Reminded her of two upcoming appts she has those on AVS from [facility] appt. No issues getting there noted.   Patient is participating in a Managed Medicaid Plan:  No, Medicaid pending  SDOH Screenings   Food Insecurity: No Food Insecurity (07/06/2024)  Housing: Low Risk  (07/06/2024)  Transportation Needs: No Transportation Needs (07/06/2024)  Utilities: Not At Risk (07/06/2024)  Alcohol Screen: Low Risk  (07/25/2018)  Financial Resource Strain: Low Risk  (05/09/2024)   Received from [facility]  Social Connections: Unknown (07/09/2023)   Received from [facility]  Tobacco Use: Low Risk  (08/09/2024)    Marit Lark, MSW, LCSW Clinical Social Worker II [facility] Heart/Vascular Care Navigation  [phone]- work cell phone (preferred)

## 2024-08-15 ENCOUNTER — Telehealth: Payer: Self-pay

## 2024-08-15 NOTE — Telephone Encounter (Signed)
 Discussed new plan with patient. She understood her repeat CT needs to be completed prior to seeing Dr. Wendel again. Her 2-3 month follow-up CT is not yet scheduled, but she has follow-up with Dr. Rosemarie 10/11/2024. She understood her 09/23/2024 visit with Dr. Wendel will be cancelled and she will be called later to reschedule once CT is arranged. She was grateful for call and agreed with plan.

## 2024-08-15 NOTE — Telephone Encounter (Signed)
-----   Message from Lurena MARLA Red sent at 08/14/2024 10:18 PM EDT ----- OK.  We will push back her follow up with me until after the CT ----- Message ----- From: Rosemarie Eather RAMAN, MD Sent: 08/14/2024  10:07 AM EDT To: Arun K Thukkani, MD  I will wait and see what f/u CTA shows ----- Message ----- From: Thukkani, Arun K, MD Sent: 08/10/2024   6:50 AM EDT To: Eather RAMAN Rosemarie, MD; Lamarr DELENA Redman, RN  Thanks.  OK, should I pursue closure soon or wait until after CT scan? ----- Message ----- From: Rosemarie Eather RAMAN, MD Sent: 08/09/2024   8:24 PM EDT To: Arun K Thukkani, MD  I do not it is hard to say it could be embolic occlusion as no clear-cut evidence of dissection is found.  Lets wait for follow-up CT angiogram at 2 3 months to look for any changes which suggest otherwise ----- Message ----- From: Thukkani, Arun K, MD Sent: 08/09/2024  12:04 PM EDT To: Eather RAMAN Rosemarie, MD; Ary Cummins, MD  All,      I am going to assume that the vertebral artery dissection was either an overcall or not important in regards to her overall presentation.  If this is the case then I will proceed to PFO closure.  Please let me know what you think or if I have anything mistaken. Thanks, AT

## 2024-08-15 NOTE — Telephone Encounter (Signed)
 [name]

## 2024-08-16 ENCOUNTER — Telehealth: Payer: Self-pay | Admitting: Licensed Clinical Social Worker

## 2024-08-16 NOTE — Telephone Encounter (Signed)
 H&V Care Navigation CSW Progress Note  Clinical Social Worker contacted patient by phone to f/u on approved Medicaid and ensure even though our f/u appt with [facility] was cancelled that she still understands other f/u appts scheduled. Was able to reach her today at [phone]. She is aware of Medicaid approval and to keep her eyes out for any mail from [facility] or [facility]. She confirms she had discussion with Rockie, RN, yesterday and is aware she needs repeat CT before seeing Dr. Wendel. She plans to attend the appt with PM&R for f/u tomorrow and has upcoming PCP appt to establish care with [facility]. She shares that her aunt didn't have any room for her long term so they are currently paying for her to stay locally in a hotel. Pt can still get mail etc at her aunt's house. No additional questions.   Patient is participating in a Managed Medicaid Plan:  Yes- [employer] Medicaid  SDOH Screenings   Food Insecurity: No Food Insecurity (07/06/2024)  Housing: Low Risk  (07/06/2024)  Transportation Needs: No Transportation Needs (07/06/2024)  Utilities: Not At Risk (07/06/2024)  Alcohol Screen: Low Risk  (07/25/2018)  Financial Resource Strain: Low Risk  (05/09/2024)   Received from [facility]  Social Connections: Unknown (07/09/2023)   Received from [facility]  Tobacco Use: Low Risk  (08/09/2024)    Marit Lark, MSW, LCSW Clinical Social Worker II [facility] Heart/Vascular Care Navigation  [phone]- work cell phone (preferred)

## 2024-08-16 NOTE — Progress Notes (Signed)
 Subjective:    Patient ID: Cassidy Bruce, female    DOB: 03-17-1996, 27 y.o.   MRN: [id number]  HPI: Cassidy Bruce is a 28 y.o. female who is here for HFU appointment to F/U on her Thromboembolic stroke, PFO and Hyperlipidemia. She presented to [facility] room on 07/05/2024 with dizziness, slurred speech, headache, gait abnormality and left sided weakness.   Dr. Silvester: H&P:  HPI: Cassidy Bruce is a 28 y.o. female with medical history significant of depression     Presented with  seizure like activity Patient has been having intermittent headaches At 3:30 pm she went un responsive was shaking all over was not following commands not answering questions  Tylenol  levels and  salicylate levels negative  Etoh negative Appeared ataxic CTA showed vertebral artery occlusion Neuro was consulted patient will need stroke work up         Patient reported she was eating  spaghetti when she had severe headache and vertigo Associated with Nausea now vomiting slurred speech, incoordination, imbalance.  Code stroke was activated  in ER   CT Head WO Contrast:  IMPRESSION: 1. No acute intracranial abnormality. 2. New right cerebellar infarct since the prior study (2020), but likely chronic.  CTA:  IMPRESSION: 1. Proximal left vertebral artery dissection. Distal v2 segment reconstitution but with attenuated enhancement relative to the contralateral side along the remainder of its course. 2. A 13 mL region of ischemia affecting both cerebellar hemispheres, worse on the left, with no core infarct.  MR Brain WO Contrast:  IMPRESSION: Acute infarcts within the cerebellar vermis and bilateral cerebellar hemispheres. Most notably, large acute infarcts are present within the superior cerebellar artery territories bilaterally. Posterior fossa mass effect without cerebellar tonsillar herniation or evidence of obstructive hydrocephalus. Petechial hemorrhage within the cerebellar  vermis and superior right cerebellar hemisphere.  Neurology Consulted  On 07/07/2024:  TRANSESOPHAGEAL ECHOCARDIOGRAM   She had a F/U with cardiology: small PFO  She is on aspirin  81 mg daily and Plavix  75 mg daily x 3 months then aspirin  alone.   Cassidy Bruce was admitted to inpatient therapy on 07/13/2024 and discharged home on 08/03/2024,She is scheduled for outpatient therapy with Neuro- Rehabilitation.She denies any pain. She rates her pain 0. Also reports she has a good appetite.   Aunt in room all questions answered.    Pain Inventory Average Pain 0 Pain Right Now 0 My pain is no pain  LOCATION OF PAIN  no pain  BOWEL Number of stools per week: 3 Oral laxative use No  Type of laxative none Enema or suppository use No  History of colostomy No  Incontinent No   BLADDER Normal In and out cath, frequency  Able to self cath N/A Bladder incontinence No  Frequent urination No  Leakage with coughing No  Difficulty starting stream No  Incomplete bladder emptying No    Mobility do you drive?  no  Function not employed: date last employed n/a  Neuro/Psych No problems in this area  Prior Studies Any changes since last visit?  no  Physicians involved in your care Any changes since last visit?  no   Family History  Problem Relation Age of Onset   Cancer Other    Diabetes Other    CAD Other    Healthy Mother    Social History   Socioeconomic History   Marital status: Single    Spouse name: Not on file   Number of children: Not on file   Years  of education: Not on file   Highest education level: Not on file  Occupational History   Not on file  Tobacco Use   Smoking status: Never   Smokeless tobacco: Never  Vaping Use   Vaping status: Never Used  Substance and Sexual Activity   Alcohol use: No   Drug use: No   Sexual activity: Yes    Birth control/protection: Implant  Other Topics Concern   Not on file  Social History Narrative   Not on file    Social Drivers of Health   Financial Resource Strain: Low Risk  (05/09/2024)   Received from [facility]   Overall Financial Resource Strain (CARDIA)    Difficulty of Paying Living Expenses: Not hard at all  Food Insecurity: No Food Insecurity (07/06/2024)   Hunger Vital Sign    Worried About Running Out of Food in the Last Year: Never true    Ran Out of Food in the Last Year: Never true  Transportation Needs: No Transportation Needs (07/06/2024)   PRAPARE - Administrator, Civil Service (Medical): No    Lack of Transportation (Non-Medical): No  Physical Activity: Not on file  Stress: Not on file  Social Connections: Unknown (07/09/2023)   [facility] from [facility]   Social Network    Social Network: Not on file   Past Surgical History:  Procedure Laterality Date   TRANSESOPHAGEAL ECHOCARDIOGRAM (CATH LAB) N/A 07/07/2024   Procedure: TRANSESOPHAGEAL ECHOCARDIOGRAM;  Surgeon: Lonni Slain, MD;  Location: [facility] INVASIVE CV LAB;  Service: Cardiovascular;  Laterality: N/A;   Past Medical History:  Diagnosis Date   ADHD    Depression    There were no vitals taken for this visit.  Opioid Risk Score:   Fall Risk Score:  `1  Depression screen PHQ 2/9      No data to display          Review of Systems     Objective:   Physical Exam Vitals and nursing note reviewed.  Constitutional:      Appearance: Normal appearance.  Cardiovascular:     Rate and Rhythm: Normal rate and regular rhythm.     Pulses: Normal pulses.     Heart sounds: Normal heart sounds.  Pulmonary:     Effort: Pulmonary effort is normal.     Breath sounds: Normal breath sounds.  Musculoskeletal:     Comments: Normal Muscle Bulk and Muscle Testing Reveals:  Upper Extremities: Full ROM and Muscle Strength  on Right  5/5T and Left 4/4 Lower Extremities: Full ROM and Muscle Strength 5/5 Arises from Table with ease Narrow Based  Gait     Skin:    General: Skin is warm and dry.   Neurological:     Mental Status: She is alert and oriented to person, place, and time.  Psychiatric:        Mood and Affect: Mood normal.        Behavior: Behavior normal.          Assessment & Plan:  Thromboembolic stroke: Continue current medication regimen. Scheduled for [facility]. Has a scheduled appointment with Dr Rosemarie. Continue to monitor. 2.  PFO:  Cardiology Following. Continue to monitor.  3. Hyperlipidemia: Continue current medication regimen: she has a scheduled PCP appointment on 09/21/2024. Continue to monitor.   F/U with Dr Carilyn in 4- 6 weeks

## 2024-08-17 ENCOUNTER — Encounter: Payer: MEDICAID | Attending: Registered Nurse | Admitting: Registered Nurse

## 2024-08-17 ENCOUNTER — Telehealth: Payer: Self-pay | Admitting: Neurology

## 2024-08-17 VITALS — BP 130/85 | HR 90 | Ht 64.0 in | Wt 139.2 lb

## 2024-08-17 DIAGNOSIS — I639 Cerebral infarction, unspecified: Secondary | ICD-10-CM | POA: Insufficient documentation

## 2024-08-17 DIAGNOSIS — E7849 Other hyperlipidemia: Secondary | ICD-10-CM | POA: Diagnosis present

## 2024-08-17 DIAGNOSIS — Q2112 Patent foramen ovale: Secondary | ICD-10-CM | POA: Insufficient documentation

## 2024-08-17 MED ORDER — ROSUVASTATIN CALCIUM 40 MG PO TABS: 40.0000 mg | ORAL_TABLET | Freq: Every day | ORAL | 0 refills | Status: DC

## 2024-08-17 MED ORDER — CLOPIDOGREL BISULFATE 75 MG PO TABS: 75.0000 mg | ORAL_TABLET | Freq: Every day | ORAL | 0 refills | Status: DC

## 2024-08-17 MED ORDER — BUTALBITAL-APAP-CAFFEINE 50-325-40 MG PO TABS: 1.0000 | ORAL_TABLET | Freq: Three times a day (TID) | ORAL | 0 refills | Status: AC | PRN

## 2024-08-17 MED ORDER — CITALOPRAM HYDROBROMIDE 20 MG PO TABS: 20.0000 mg | ORAL_TABLET | Freq: Every day | ORAL | 0 refills | Status: DC

## 2024-08-17 MED ORDER — FERROUS SULFATE 325 (65 FE) MG PO TABS: 325.0000 mg | ORAL_TABLET | ORAL | 0 refills | Status: AC

## 2024-08-17 NOTE — Telephone Encounter (Signed)
 Noted and I also spoke with Dr Rosemarie. Will explain to patient in mychart.

## 2024-08-17 NOTE — Telephone Encounter (Signed)
 The patient called and said that she saw her cardiologist on 10/7 and she was supposed to schedule a follow-up with them in a month, but they told her that they won't schedule it until she sees neurology. She is scheduled with Dr. Rosemarie in December for a hospital follow-up and is wondering if she can get in sooner so she can see the cardiologist again and she is confused why they're making her see neurology first before she can go back to them. Due to her stroke, her speech is hard to understand and she said she is okay communicating through MyChart.

## 2024-08-18 NOTE — Therapy (Signed)
 OUTPATIENT OCCUPATIONAL THERAPY NEURO EVALUATION  Patient Name: Cassidy Bruce MRN: [id number] DOB:1995/12/20, 28 y.o., female Today's Date: 08/19/2024  PCP: None REFERRING PROVIDER: Carilyn Prentice BRAVO, MD  END OF SESSION:  OT End of Session - 08/19/24 1705     Visit Number 1    Number of Visits 9   plus eval   Authorization Type [employer] MCD    OT Start Time 1445    OT Stop Time 1528    OT Time Calculation (min) 43 min    Equipment Utilized During Treatment testing materials    Activity Tolerance Patient tolerated treatment well    Behavior During Therapy WFL for tasks assessed/performed          Past Medical History:  Diagnosis Date   ADHD    Depression    Past Surgical History:  Procedure Laterality Date   TRANSESOPHAGEAL ECHOCARDIOGRAM (CATH LAB) N/A 07/07/2024   Procedure: TRANSESOPHAGEAL ECHOCARDIOGRAM;  Surgeon: Lonni Slain, MD;  Location: [facility] INVASIVE CV LAB;  Service: Cardiovascular;  Laterality: N/A;   Patient Active Problem List   Diagnosis Date Noted   Severe episode of recurrent major depressive disorder, without psychotic features (HCC) 07/15/2024   Panic disorder with agoraphobia and moderate panic attacks 07/15/2024   Thromboembolic stroke (HCC) 07/13/2024   PFO (patent foramen ovale) 07/07/2024   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   Hypokalemia 07/06/2024   Stroke (HCC) 07/05/2024   MDD (major depressive disorder), recurrent episode, moderate (HCC) 07/25/2018   Adjustment disorder with mixed anxiety and depressed mood     ONSET DATE: Admit date: 07/13/2024 Discharge date: 08/03/2024  Referral date: 08/16/24  REFERRING DIAG: I63.9 (ICD-10-CM) - Cerebral infarction, unspecified  THERAPY DIAG:  Other lack of coordination - Plan: Ot plan of care cert/re-cert  Muscle weakness (generalized) - Plan: Ot plan of care cert/re-cert  Visuospatial deficit - Plan: Ot plan of care cert/re-cert  Thromboembolic stroke [facility]) - Plan: Ot plan of  care cert/re-cert  Rationale for Evaluation and Treatment: Rehabilitation  SUBJECTIVE:   SUBJECTIVE STATEMENT: Pt reported she was doing well today. Pt accompanied by: self, aunt dropped her off.  PERTINENT HISTORY: Presented 07/05/2024 with dizziness slurred speech headache and gait abnormality with left-sided weakness. Per family she had went to the gym on Monday, 07/04/2024 for weightlifting as usual no specific problems. Tuesday morning she awoke from sleep with headache and neck pain and by the afternoon patient with increasing dizziness vertigo as well as slurred speech. Cranial CT scan showed no acute intracranial abnormalities new right cerebellar infarct since prior study of 2021 felt to be possibly chronic. 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 Distal V2 segment reconstitution but with attenuated enhancement relative to the contralateral side along the remainder of its course. A 13 mm region of ischemia affecting both cerebral hemispheres worse on the left with no more core infarct. MRI identified acute infarcts within the cerebellar vermis and bilateral cerebellar hemispheres. Most notably large acute infarct present within the superior cerebellar artery territories bilaterally. Posterior fossa mass effect without cerebellar tonsillar herniation or evidence of obstructive hydrocephalus. Petechial hemorrhage within the cerebellar vermis and superior right cerebellar hemisphere.  Other PMH includes: MDD (major depressive disorder), recurrent episode, moderate (HCC)   Severe episode of recurrent major depressive disorder, without psychotic features (HCC)   Panic disorder with agoraphobia and moderate panic attacks PFO Left VA occlusion possible dissection Hyperlipidemia MDD/ADHD Microcytosis  PRECAUTIONS: FallL sided weakness, blood thinners   WEIGHT BEARING RESTRICTIONS: No  PAIN:  Are you  having pain? No  FALLS: Has patient fallen in last 6 months? Yes. 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   OBJECTIVE:  Note: Objective measures were completed at Evaluation unless otherwise noted.  HAND DOMINANCE: Right  ADLs: Overall ADLs: Independent Equipment: none  IADLs: Shopping: Haven't attempted yet  Light housekeeping: Independent Meal Prep: Hasn't attempted yet  Community mobility: Not driving but was before stroke, aunt brought her to appt this date. Not cleared to drive at this time. Medication management: Reports doing things pretty fine, though sometimes takes with or without food, does not take at same time consistently, especially for meds in the morning as pt tends to sleep late. Financial management: Reports it is a little stressful d/t not working at this time and being unable to pay.  Handwriting: 100% legible and Mild micrographia  MOBILITY STATUS: Independent  POSTURE COMMENTS:  No Significant postural limitations Sitting balance: WNL  ACTIVITY TOLERANCE: Activity tolerance: Reports getting a little more winded   FUNCTIONAL OUTCOME MEASURES: Upper Extremity Functional Scale (UEFS): 63/80, functioning at 78.75%, 21.25% impairment  UPPER EXTREMITY ROM:  WNL  Active ROM Right eval Left eval  Shoulder flexion    Shoulder abduction    Shoulder adduction    Shoulder extension    Shoulder internal rotation    Shoulder external rotation    Elbow flexion    Elbow extension    Wrist flexion    Wrist extension    Wrist ulnar deviation    Wrist radial deviation    Wrist pronation    Wrist supination    (Blank rows = not tested)  UPPER  EXTREMITY MMT:   WNL  MMT Right eval Left eval  Shoulder flexion    Shoulder abduction    Shoulder adduction    Shoulder extension    Shoulder internal rotation    Shoulder external rotation    Middle trapezius    Lower trapezius    Elbow flexion  4  Elbow extension  4  Wrist flexion    Wrist extension    Wrist ulnar deviation    Wrist radial deviation    Wrist pronation    Wrist supination    (Blank rows = not tested)  HAND FUNCTION: Grip strength: Right: 50.9 lbs; Left: 27.3 lbs  COORDINATION: 9 Hole Peg test: Right: 44.26 sec; Left: 75.78 sec (Pt was wearing artificial nails) Box and Blocks:  Right 49 blocks, Left 24 blocks  SENSATION: WFL  EDEMA: none  MUSCLE TONE: LUE: Within functional limits  COGNITION: Overall cognitive status: Within functional limits for tasks assessed  VISION: Subjective report: no changes  Baseline vision: Wears glasses for distance only, however cannot find her glasses at this time Visual history: none  VISION ASSESSMENT: To be further assessed in functional context  Patient has difficulty with following activities due to following visual impairments: can't see well with distance  PERCEPTION: Not tested  PRAXIS: Not tested  OBSERVATIONS: limited grip strength and coordination in affected L hand as well as L shoulder and elbow strength. This is affecting some  IADL/ADL tasks as well as ability to return to work.                                                                                                                             TREATMENT DATE: 08/19/24   Educated pt in purpose of OT, goals, and POC. Pt in agreement at this time. Educated pt in resources for financial concerns including [email or web address].org and its local support group, pt reported that she is going to have a PCP with [facility] and having appt with Greig DOROTHA Drones on 09/21/24 at 2:45 PM. Will discuss with manager and team about [facility] referral program if she  can be referred prior to being seen by future PCP.   Pt reported not taking prescribed medications as directed consistently. Pt educated in purpose of prescription directions and importance of adhering to taking medications as prescribed.      PATIENT EDUCATION: Education details: SEE ABOVE Person educated: Patient Education method: Medical illustrator Education comprehension: verbalized understanding  HOME EXERCISE PROGRAM: To be established   GOALS: Goals reviewed with patient? Yes  SHORT TERM GOALS: Target date: 09/16/24  Pt to be independent with HEP for LUE strength and coordination Baseline: New to OP OT Goal status: INITIAL  2.  Pt will improve LUE elbow strength to 5/5 for improved functional transfers and functional use Baseline: 4/5  Goal status: INITIAL  3.  Pt will demonstrate improved function in LUE by scoring at least 34 on Box and Blocks test  Baseline: Right 49 blocks, Left 24 blocks Goal status: INITIAL  4.  Pt will demonstrate improved FM coordination in L hand by completing 9 Hole Peg Test in no more than 60 seconds  Baseline: 9 Hole Peg test: Right: 44.26 sec; Left: 75.78 sec  Goal status: INITIAL  5.  Pt will increase L grip strength by at least 10 lbs for improved functional use  Baseline: 27.3 lbs L hand Goal status: INITIAL   LONG TERM GOALS: Target date: 10/14/24  Pt will demonstrate improved functional capability by scoring no less than 71/80 on UEFS Baseline: 63/80 Goal status: INITIAL  2.  Pt will increase L grip strength by at least 20 pounds for improved ability to open jars during meal prep tasks  Baseline: 27.3 lbs L hand Goal status: INITIAL  3.  Pt will complete light meal prep task with no rest breaks  Baseline: New to OP OT, pt reports not attempting since CVA. Goal status: INITIAL  4.  Pt will demonstrate improved bilateral coordination by a score of no less than 30 seconds on each hand on 9 Hole Peg Test   Baseline: 9 Hole Peg test: Right: 44.26 sec; Left: 75.78 sec (pt was wearing artificial nails)  Goal status: INITIAL  5. Pt will demonstrate improved function in BUE by scoring at least 50 on Box and Blocks test  Baseline: Right 49 blocks, Left 24 blocks Goal status: INITIAL  ASSESSMENT:  CLINICAL IMPRESSION: Patient is a 28 y.o. female who was seen today for occupational therapy evaluation for L sided weakness s/p thromboembolic stroke. Hx includes MDD, panic disorder, HLD, microcytosis. Patient currently presents below baseline level of functioning demonstrating functional deficits and impairments as noted below. Pt would benefit from skilled OT services in the outpatient setting to work on impairments as noted below to help pt return to PLOF as able.     PERFORMANCE DEFICITS: in functional skills including ADLs, IADLs, coordination, dexterity, strength, Fine motor control, Gross motor control, body mechanics, endurance, and UE functional use, cognitive skills including attention and energy/drive, and psychosocial skills including coping strategies, environmental adaptation, and habits.   IMPAIRMENTS: are limiting patient from ADLs, IADLs, work, and leisure.   CO-MORBIDITIES: may have co-morbidities  that affects occupational performance. Patient will benefit from skilled OT to address above impairments and improve overall function.  MODIFICATION OR ASSISTANCE TO COMPLETE EVALUATION: Min-Moderate modification of tasks or assist with assess necessary to complete an evaluation.  OT OCCUPATIONAL PROFILE AND HISTORY: Detailed assessment: Review of records and additional review of physical, cognitive, psychosocial history related to current functional performance.  CLINICAL DECISION MAKING: Moderate - several treatment options, min-mod task modification necessary  REHAB POTENTIAL: Good  EVALUATION COMPLEXITY: Moderate   For all possible CPT codes, reference the Planned Interventions  line above.     Check all conditions that are expected to impact treatment: {Conditions expected to impact treatment:Musculoskeletal disorders and Neurological condition and/or seizures   If treatment provided at initial evaluation, no treatment charged due to lack of authorization.      PLAN:  OT FREQUENCY: 1x/week  OT DURATION: 8 weeks  PLANNED INTERVENTIONS: 97168 OT Re-evaluation, 97535 self care/ADL training, 02889 therapeutic exercise, 97530 therapeutic activity, 97112 neuromuscular re-education, 97140 manual therapy, 97760 Orthotic Initial, 97763 Orthotic/Prosthetic subsequent, passive range of motion, functional mobility training, energy conservation, patient/family education, and DME and/or AE instructions  RECOMMENDED OTHER SERVICES: none, pt getting PT and SLP eval  CONSULTED AND AGREED WITH PLAN OF CARE: Patient  PLAN FOR NEXT SESSION: L hand strengthening and coordination HEP LUE theraband exercise as appropriate   Rocky Dutch, OT 08/19/2024, 5:06 PM

## 2024-08-19 ENCOUNTER — Ambulatory Visit

## 2024-08-19 ENCOUNTER — Ambulatory Visit: Attending: Physical Medicine & Rehabilitation | Admitting: Physical Therapy

## 2024-08-19 ENCOUNTER — Encounter: Payer: Self-pay | Admitting: Physical Therapy

## 2024-08-19 ENCOUNTER — Other Ambulatory Visit: Payer: Self-pay

## 2024-08-19 DIAGNOSIS — I69354 Hemiplegia and hemiparesis following cerebral infarction affecting left non-dominant side: Secondary | ICD-10-CM | POA: Insufficient documentation

## 2024-08-19 DIAGNOSIS — R41844 Frontal lobe and executive function deficit: Secondary | ICD-10-CM | POA: Diagnosis present

## 2024-08-19 DIAGNOSIS — M6281 Muscle weakness (generalized): Secondary | ICD-10-CM

## 2024-08-19 DIAGNOSIS — R41842 Visuospatial deficit: Secondary | ICD-10-CM | POA: Insufficient documentation

## 2024-08-19 DIAGNOSIS — R29898 Other symptoms and signs involving the musculoskeletal system: Secondary | ICD-10-CM | POA: Insufficient documentation

## 2024-08-19 DIAGNOSIS — I639 Cerebral infarction, unspecified: Secondary | ICD-10-CM | POA: Diagnosis present

## 2024-08-19 DIAGNOSIS — R278 Other lack of coordination: Secondary | ICD-10-CM | POA: Diagnosis present

## 2024-08-19 DIAGNOSIS — R2681 Unsteadiness on feet: Secondary | ICD-10-CM | POA: Insufficient documentation

## 2024-08-19 DIAGNOSIS — R41840 Attention and concentration deficit: Secondary | ICD-10-CM | POA: Insufficient documentation

## 2024-08-19 NOTE — Patient Instructions (Signed)
 Access Code: [email or web address] URL: [email or web address] Date: 08/19/2024 Prepared by: Daved Bull  Exercises - Seated Hamstring Curls with Resistance  - 1 x daily - 7 x weekly - 2 sets - 10 reps - Standing Hamstring Curl with Resistance  - 1 x daily - 7 x weekly - 2 sets - 10 reps

## 2024-08-19 NOTE — Therapy (Unsigned)
 OUTPATIENT PHYSICAL THERAPY NEURO EVALUATION   Patient Name: Cassidy Bruce MRN: [id number] DOB:July 14, 1996, 28 y.o., female Today's Date: 08/19/2024   PCP: Greig JINNY Drones, NP REFERRING PROVIDER: Carilyn Prentice BRAVO, MD  END OF SESSION:  PT End of Session - 08/19/24 1544     Visit Number 1    Number of Visits 9   8 + eval   Date for Recertification  10/28/24   pushed out due to multi-D scheduling and aquatic needs   Authorization Type [employer]    PT Start Time 1541    PT Stop Time 1621    PT Time Calculation (min) 40 min    Equipment Utilized During Treatment Gait belt    Activity Tolerance Patient tolerated treatment well    Behavior During Therapy WFL for tasks assessed/performed          Past Medical History:  Diagnosis Date   ADHD    Depression    Past Surgical History:  Procedure Laterality Date   TRANSESOPHAGEAL ECHOCARDIOGRAM (CATH LAB) N/A 07/07/2024   Procedure: TRANSESOPHAGEAL ECHOCARDIOGRAM;  Surgeon: Lonni Slain, MD;  Location: [facility] INVASIVE CV LAB;  Service: Cardiovascular;  Laterality: N/A;   Patient Active Problem List   Diagnosis Date Noted   Severe episode of recurrent major depressive disorder, without psychotic features (HCC) 07/15/2024   Panic disorder with agoraphobia and moderate panic attacks 07/15/2024   Thromboembolic stroke (HCC) 07/13/2024   PFO (patent foramen ovale) 07/07/2024   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   Hypokalemia 07/06/2024   Stroke (HCC) 07/05/2024   MDD (major depressive disorder), recurrent episode, moderate (HCC) 07/25/2018   Adjustment disorder with mixed anxiety and depressed mood     ONSET DATE: 07/05/2024 (CVA)  REFERRING DIAG: I63.9 (ICD-10-CM) - Cerebral infarction, unspecified  THERAPY DIAG:  Hemiplegia and hemiparesis following cerebral infarction affecting left non-dominant side (HCC)  Unsteadiness on feet  Muscle weakness (generalized)  Rationale for Evaluation and Treatment:  Rehabilitation  SUBJECTIVE:                                                                                                                                                                                             SUBJECTIVE STATEMENT: Pt reports she is wobbly when she walks - she uses triangular rollator for long distances but otherwise no AD.  She states she feels mildly weak in her legs. Pt accompanied by: family member (aunt in lobby - pt does not drive herself)  PERTINENT HISTORY: MDD, CVA, PFO, panic disorder w/ agoraphobia and moderate panic attacks  Per inpt rehab discharge summary: Presented 07/05/2024 with dizziness slurred speech headache and gait abnormality  with left-sided weakness. Per family she had went to the gym on Monday, 07/04/2024 for weightlifting as usual no specific problems. Tuesday morning she awoke from sleep with headache and neck pain and by the afternoon patient with increasing dizziness vertigo as well as slurred speech. Cranial CT scan showed no acute intracranial abnormalities new right cerebellar infarct since prior study of 2021 felt to be possibly chronic. 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 Distal V2 segment reconstitution but with attenuated enhancement relative to the contralateral side along the remainder of its course. A 13 mm region of ischemia affecting both cerebral hemispheres worse on the left with no more core infarct. MRI identified acute infarcts within the cerebellar vermis and bilateral cerebellar hemispheres. Most notably large acute infarct present within the superior cerebellar artery territories bilaterally. Posterior fossa mass effect without cerebellar tonsillar herniation or evidence of obstructive hydrocephalus. Petechial hemorrhage within the cerebellar vermis and superior right cerebellar hemisphere. Patient did not receive TNK. No thrombectomy needed for left VA occlusion as BA was patent.  PAIN:  Are you having pain?  No  PRECAUTIONS: Fall and Other: Zio heart monitor  RED FLAGS: None   WEIGHT BEARING RESTRICTIONS: No  FALLS: Has patient fallen in last 6 months? No and day of CVA she slid to the floor to lay down  LIVING ENVIRONMENT: Lives with: lives alone Lives in: Transitional housing - hotel Stairs: No - mostly uses elevator but will walk down the stairs Has following equipment at home: shower chair and elevator and triangle rollator  PLOF: Independent - she has been trying to navigate crowds (went to [employer] and [employer])  PATIENT GOALS: my balance  OBJECTIVE:  Note: Objective measures were completed at Evaluation unless otherwise noted.  DIAGNOSTIC FINDINGS:  Brain MRI 07/11/2024: IMPRESSION: 1. Evolving early subacute bilateral cerebellar infarcts, left slightly worse than right. Possible mild interval expansion versus changes of Wallerian degeneration (favored) involving the midbrain since previous. Associated petechial hemorrhage without frank hemorrhagic transformation. 2. Loss of normal flow void within the left vertebral artery, consistent with previously identified dissection. 3. Otherwise stable and normal brain MRI.  COGNITION: Overall cognitive status: Impaired and delayed processing and speech impairments/slurred speech   SENSATION: Light touch: WFL  COORDINATION: BLE RAMS:  LLE fatigues and slows with prolonged task Heel-to-shin:  mildly dysmetric LLE  EDEMA:  None noted in BLE  MUSCLE TONE: None noted in LLE  POSTURE: forward head - very mild  LOWER EXTREMITY ROM:     Active  Right Eval Left Eval  Hip flexion Grossly WNL  Hip extension   Hip abduction   Hip adduction   Hip internal rotation   Hip external rotation   Knee flexion   Knee extension   Ankle dorsiflexion   Ankle plantarflexion    Ankle inversion    Ankle eversion     (Blank rows = not tested)  LOWER EXTREMITY MMT:    MMT Right Eval Left Eval  Hip flexion 5 4+  Hip  extension    Hip abduction 4+ 4  Hip adduction    Hip internal rotation    Hip external rotation    Knee flexion 5 4+  Knee extension 5 5  Ankle dorsiflexion 5 5  Ankle plantarflexion    Ankle inversion    Ankle eversion    (Blank rows = not tested)  BED MOBILITY:  Findings: Sit to supine Complete Independence Supine to sit Complete Independence Rolling to Right Complete Independence Rolling to Left  Complete Independence  TRANSFERS: Sit to stand: Complete Independence  Assistive device utilized: None     Stand to sit: Complete Independence  Assistive device utilized: None     Chair to chair: SBA  Assistive device utilized: None       RAMP:  Not tested  CURB:  Not tested  STAIRS: Not tested GAIT: Findings: Gait Characteristics: step through pattern, decreased arm swing- Left, decreased stride length, and genu recurvatum- Left, Distance walked: various clinic distances, Assistive device utilized:None, Level of assistance: SBA and CGA, and Comments: mild lateral left knee thrust in stance  FUNCTIONAL TESTS:  5 times sit to stand: 14.54 sec no UE support 10 meter walk test: 12.94 sec no AD CGA = 0.77 m/sec OR 2.55 ft/sec Functional gait assessment:  FUNCTIONAL GAIT ASSESSMENT  Date: 08/19/2024 Score  GAIT LEVEL SURFACE Instructions: Walk at your normal speed from here to the next mark (6 m) [20 ft]. (2) Mild impairment - Walks 6 m (20 ft) in less than 7 seconds but greater than 5.5 seconds, uses assistive device, slower speed, mild gait deviations, or deviates 15.24 -25.4 cm (6 -10 in) outside of the 30.48-cm (12-in) walkway width.  2.   CHANGE IN GAIT SPEED Instructions: Begin walking at your normal pace (for 1.5 m [5 ft]). When I tell you "go," walk as fast as you can (for 1.5 m [5 ft]). When I tell you "slow," walk as slowly as you can (for 1.5 m [5 ft]. (3) Normal - Able to smoothly change walking speed without loss of balance or gait deviation. Shows a significant  difference in walking speeds between normal, fast, and slow speeds. Deviates no more than 15.24 cm (6 in) outside of the 30.48-cm (12-in) walkway width.  3.    GAIT WITH HORIZONTAL HEAD TURNS Instructions: Walk from here to the next mark 6 m (20 ft) away. Begin walking at your normal pace. Keep walking straight; after 3 steps, turn your head to the right and keep walking straight while looking to the right. After 3 more steps, turn your head to the left and keep walking straight while looking left. Continue alternating looking right and left. (2) Mild impairment - Performs head turns smoothly with slight change in gait velocity (eg, minor disruption to smooth gait path), deviates 15.24 -25.4 cm (6 -10 in) outside 30.48-cm (12-in) walkway width, or uses an assistive device.  4.   GAIT WITH VERTICAL HEAD TURNS Instructions: Walk from here to the next mark (6 m [20 ft]). Begin walking at your normal pace. Keep walking straight; after 3 steps, tip your head up and keep walking straight while looking up. After 3 more steps, tip your head down, keep walking straight while looking down. Continue  alternating looking up and down every 3 steps until you have completed 2 repetitions in each direction. (3) Normal - Performs head turns with no change in gait. Deviates no more than 15.24 cm (6 in) outside 30.48-cm (12-in) walkway width.  5.  GAIT AND PIVOT TURN Instructions: Begin with walking at your normal pace. When I tell you, "turn and stop," turn as quickly as you can to face the opposite direction and stop. (1) Moderate impairment - Turns slowly, requires verbal cueing, or requires several small steps to catch balance following turn and stop  6.   STEP OVER OBSTACLE Instructions: Begin walking at your normal speed. When you come to the shoe box, step over it, not around it, and keep walking. (1) Moderate  impairment - Is able to step over one shoe box (11.43 cm [4.5 in] total height) but must slow down and adjust  steps to clear box safely. May require verbal cueing.  7.   GAIT WITH NARROW BASE OF SUPPORT Instructions: Walk on the floor with arms folded across the chest, feet aligned heel to toe in tandem for a distance of 3.6 m [12 ft]. The number of steps taken in a straight line are counted for a maximum of 10 steps. (2) Mild impairment - Ambulates 7-9 steps  8.   GAIT WITH EYES CLOSED Instructions: Walk at your normal speed from here to the next mark (6 m [20 ft]) with your eyes closed. (2) Mild impairment - Walks 6 m (20 ft), uses assistive device, slower speed, mild gait deviations, deviates 15.24 -25.4 cm (6 -10 in) outside 30.48-cm (12-in) walkway width. Ambulates 6 m (20 ft) in less than 9 seconds but greater than 7 seconds  9.   AMBULATING BACKWARDS Instructions: Walk backwards until I tell you to stop (2) Mild impairment - Walks 6 m (20 ft), uses assistive device, slower speed, mild gait deviations, deviates 15.24 -25.4 cm (6 -10 in) outside 30.48-cm (12-in) walkway width  10. STEPS Instructions: Walk up these stairs as you would at home (ie, using the rail if necessary). At the top turn around and walk down. (3) Normal-Alternating feet, no rail.  Total 21/30   Interpretation of scores: Non-Specific Older Adults Cutoff Score: <=22/30 = risk of falls Parkinson's Disease Cutoff score <15/30= fall risk (Hoehn & Yahr 1-4)  Minimally Clinically Important Difference (MCID)  Stroke (acute, subacute, and chronic) = MDC: 4.2 points Vestibular (acute) = MDC: 6 points Community Dwelling Older Adults =  MCID: 4 points Parkinson's Disease  =  MDC: 4.3 points  (Academy of Neu[employer]ic Physical Therapy (nd). Functional Gait Assessment. Retrieved from https://www.neuropt.org/docs/default-source/cpgs/core-outcome-measures/function-gait-assessment-pocket-guide-proof9-(2).pdf?sfvrsn=b76f35043_0.)  PATIENT SURVEYS:  None relevant to chief complaint and age range.                                                                                                                               TREATMENT DATE: 08/19/2024    PATIENT EDUCATION: Education details: Bracing options for knee vs strengthening vs taping L knee to prevent thrust.  Initial HEP.  PT POC, assessments used, and goals to be set.  Aquatic therapy - handouts. Person educated: Patient Education method: Explanation, Demonstration, Verbal cues, and Handouts Education comprehension: verbalized understanding and needs further education  HOME EXERCISE PROGRAM: Access Code: [email or web address] URL: [email or web address] Date: 08/19/2024 Prepared by: Daved Bull  Exercises - Seated Hamstring Curls with Resistance  - 1 x daily - 7 x weekly - 2 sets - 10 reps - Standing Hamstring Curl with Resistance  - 1 x daily - 7 x weekly - 2 sets - 10 reps  GOALS: Goals reviewed with patient? Yes  SHORT TERM GOALS: Target date: 09/16/2024  Pt will be independent and compliant with  introductory strength and balance focused HEP in order to maintain functional progress and improve mobility. Baseline:  Established on eval Goal status: INITIAL  2.  Pt will be assessed for most appropriate hyperextension management w/ edu on appropriate wear or use. Baseline: Discussed bracing vs taping options on eval. Goal status: INITIAL  3.  Pt will initiate aquatic therapy for improved balance management and strengthening. Baseline: To be scheduled. Goal status: INITIAL  4.  Pt will decrease 5xSTS to </=12 seconds w/o UE support in order to demonstrate decreased risk for falls and improved functional bilateral LE strength and power. Baseline: 14.54 sec no UE support Goal status: INITIAL  5.  Pt will demonstrate a gait speed of >/=2.75 feet/sec in order to decrease risk for falls. Baseline: 2.55 ft/sec no AD SBA Goal status: INITIAL  LONG TERM GOALS: Target date: 10/14/2024  Pt will be independent and compliant with advanced and finalized strength  and balance focused HEP in order to maintain functional progress and improve mobility. Baseline: Established on eval Goal status: INITIAL  2.  Pt will improve FGA score to >/=26/30 in order to demonstrate improved balance and decreased fall risk. Baseline: 21/30 Goal status: INITIAL  3.  Pt will demonstrate a gait speed of >/=2.95 feet/sec in order to decrease risk for falls. Baseline: 2.55 ft/sec no AD SBA Goal status: INITIAL  4.  Pt will ambulate >/=500 feet without AD independently over level and unlevel surfaces using appropriate knee control in order to promote household and community access. Baseline: ambulates w/ knee hyperextension no AD limited community distances SBA-CGA Goal status: INITIAL  ASSESSMENT:  CLINICAL IMPRESSION: Patient is a 28 y.o. female who was seen today for physical therapy evaluation and treatment for CVA.  Pt has a significant PMH of MDD, CVA, PFO, panic disorder w/ agoraphobia and moderate panic attacks.  Identified impairments include left knee thrust, moderate dynamic balance impairment, left proximal LE weakness, impaired LE coordination, and speech deficits.  Evaluation via the following assessment tools: 5xSTS, , and FGA indicate elevated fall risk.  She would benefit from skilled PT to address impairments as noted and progress towards long term goals.  OBJECTIVE IMPAIRMENTS: Abnormal gait, decreased activity tolerance, decreased balance, decreased coordination, decreased knowledge of use of DME, decreased strength, and improper body mechanics.   ACTIVITY LIMITATIONS: lifting, standing, squatting, transfers, and locomotion level  PARTICIPATION LIMITATIONS: driving, shopping, community activity, and occupation  PERSONAL FACTORS: Past/current experiences and 1-2 comorbidities: MDD/panic disorder are also affecting patient's functional outcome.   REHAB POTENTIAL: Excellent  CLINICAL DECISION MAKING: Evolving/moderate complexity  EVALUATION  COMPLEXITY: Moderate  PLAN:  PT FREQUENCY: 1x/week  PT DURATION: 8 weeks  PLANNED INTERVENTIONS: 97164- PT Re-evaluation, 97750- Physical Performance Testing, 97110-Therapeutic exercises, 97530- Therapeutic activity, 97112- Neuromuscular re-education, 97535- Self Care, [id number]- Manual therapy, [phone]- Gait training, [phone]- Orthotic Initial, [phone]- Orthotic/Prosthetic subsequent, [phone]- Aquatic Therapy, [phone]- Electrical stimulation (manual), Patient/Family education, Balance training, Stair training, Taping, Joint mobilization, Vestibular training, and DME instructions  PLAN FOR NEXT SESSION: Expand HEP - work on LLE strength - preventing hyperextension (knee cage vs sleeve vs taping?), SLS and high level stability, treadmill training, step ups and heel taps  Check all possible CPT codes: See Planned Interventions List for Planned CPT Codes    Check all conditions that are expected to impact treatment: Neurological condition and/or seizures, Psychological or psychiatric disorders, and Social determinants of health   If treatment provided at initial evaluation, no treatment charged due to lack of authorization.  Daved KATHEE Bull, PT, DPT 08/19/2024, 4:40 PM

## 2024-08-21 ENCOUNTER — Encounter: Payer: Self-pay | Admitting: Registered Nurse

## 2024-08-30 ENCOUNTER — Encounter: Payer: Self-pay | Admitting: Physical Therapy

## 2024-08-30 ENCOUNTER — Ambulatory Visit

## 2024-08-30 ENCOUNTER — Ambulatory Visit: Payer: Self-pay | Admitting: Internal Medicine

## 2024-08-30 ENCOUNTER — Ambulatory Visit: Admitting: Physical Therapy

## 2024-08-30 DIAGNOSIS — M6281 Muscle weakness (generalized): Secondary | ICD-10-CM

## 2024-08-30 DIAGNOSIS — Q2112 Patent foramen ovale: Secondary | ICD-10-CM

## 2024-08-30 DIAGNOSIS — I63212 Cerebral infarction due to unspecified occlusion or stenosis of left vertebral arteries: Secondary | ICD-10-CM | POA: Diagnosis not present

## 2024-08-30 DIAGNOSIS — I69354 Hemiplegia and hemiparesis following cerebral infarction affecting left non-dominant side: Secondary | ICD-10-CM

## 2024-08-30 DIAGNOSIS — R2681 Unsteadiness on feet: Secondary | ICD-10-CM

## 2024-08-30 DIAGNOSIS — R41840 Attention and concentration deficit: Secondary | ICD-10-CM

## 2024-08-30 DIAGNOSIS — R278 Other lack of coordination: Secondary | ICD-10-CM

## 2024-08-30 DIAGNOSIS — R41844 Frontal lobe and executive function deficit: Secondary | ICD-10-CM

## 2024-08-30 DIAGNOSIS — R29898 Other symptoms and signs involving the musculoskeletal system: Secondary | ICD-10-CM

## 2024-08-30 NOTE — Patient Instructions (Addendum)
    Coordination Activities  Perform the following activities for 5-10 minutes 1-2 times per day with left hand(s).  Rotate putty container in fingertips (clockwise and counter-clockwise), can complete with container on table in needed Flip cards 1 at a time as fast as you can. Deal cards with your thumb (Hold deck in hand and push card off top with thumb). Pick up coins, buttons, marbles, dried beans/pasta of different sizes and place in container. Pick up coins one at a time until you get 5-10 in your hand, then move coins from palm to fingertips to stack one at a time. Twirl pen between fingers. Take a tissue and wad into a ball.

## 2024-08-30 NOTE — Therapy (Signed)
 OUTPATIENT PHYSICAL THERAPY NEURO TREATMENT   Patient Name: Cassidy Bruce MRN: [id number] DOB:Mar 17, 1996, 28 y.o., female Today's Date: 08/30/2024   PCP: Greig JINNY Drones, NP REFERRING PROVIDER: Carilyn Prentice BRAVO, MD  END OF SESSION:  PT End of Session - 08/30/24 1536     Visit Number 2    Number of Visits 9   8 + eval   Date for Recertification  10/28/24   pushed out due to multi-D scheduling and aquatic needs   Authorization Type [place] Medicaid    PT Start Time 1533    PT Stop Time 1616    PT Time Calculation (min) 43 min    Equipment Utilized During Treatment Gait belt    Activity Tolerance Patient tolerated treatment well    Behavior During Therapy WFL for tasks assessed/performed          Past Medical History:  Diagnosis Date   ADHD    Depression    Past Surgical History:  Procedure Laterality Date   TRANSESOPHAGEAL ECHOCARDIOGRAM (CATH LAB) N/A 07/07/2024   Procedure: TRANSESOPHAGEAL ECHOCARDIOGRAM;  Surgeon: Lonni Slain, MD;  Location: [facility] INVASIVE CV LAB;  Service: Cardiovascular;  Laterality: N/A;   Patient Active Problem List   Diagnosis Date Noted   Severe episode of recurrent major depressive disorder, without psychotic features (HCC) 07/15/2024   Panic disorder with agoraphobia and moderate panic attacks 07/15/2024   Thromboembolic stroke (HCC) 07/13/2024   PFO (patent foramen ovale) 07/07/2024   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   Hypokalemia 07/06/2024   Stroke (HCC) 07/05/2024   MDD (major depressive disorder), recurrent episode, moderate (HCC) 07/25/2018   Adjustment disorder with mixed anxiety and depressed mood     ONSET DATE: 07/05/2024 (CVA)  REFERRING DIAG: I63.9 (ICD-10-CM) - Cerebral infarction, unspecified  THERAPY DIAG:  Other lack of coordination  Muscle weakness (generalized)  Hemiplegia and hemiparesis following cerebral infarction affecting left non-dominant side (HCC)  Unsteadiness on feet  Rationale for  Evaluation and Treatment: Rehabilitation  SUBJECTIVE:                                                                                                                                                                                             SUBJECTIVE STATEMENT: Pt presents ambulatory independently following OT session today.  Denies falls or pain.  She is interested in scheduling 1 aquatic visit to start - she states she cannot swim, but is not afraid of water . Pt accompanied by: family member (aunt in lobby - pt does not drive herself)  PERTINENT HISTORY: MDD, CVA, PFO, panic disorder w/ agoraphobia and moderate panic attacks  Per inpt rehab discharge  summary: Presented 07/05/2024 with dizziness slurred speech headache and gait abnormality with left-sided weakness. Per family she had went to the gym on Monday, 07/04/2024 for weightlifting as usual no specific problems. Tuesday morning she awoke from sleep with headache and neck pain and by the afternoon patient with increasing dizziness vertigo as well as slurred speech. Cranial CT scan showed no acute intracranial abnormalities new right cerebellar infarct since prior study of 2021 felt to be possibly chronic. 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 Distal V2 segment reconstitution but with attenuated enhancement relative to the contralateral side along the remainder of its course. A 13 mm region of ischemia affecting both cerebral hemispheres worse on the left with no more core infarct. MRI identified acute infarcts within the cerebellar vermis and bilateral cerebellar hemispheres. Most notably large acute infarct present within the superior cerebellar artery territories bilaterally. Posterior fossa mass effect without cerebellar tonsillar herniation or evidence of obstructive hydrocephalus. Petechial hemorrhage within the cerebellar vermis and superior right cerebellar hemisphere. Patient did not receive TNK. No thrombectomy needed for  left VA occlusion as BA was patent.  PAIN:  Are you having pain? No  PRECAUTIONS: Fall and Other: Zio heart monitor  RED FLAGS: None   WEIGHT BEARING RESTRICTIONS: No  FALLS: Has patient fallen in last 6 months? No and day of CVA she slid to the floor to lay down  LIVING ENVIRONMENT: Lives with: lives alone Lives in: Transitional housing - hotel Stairs: No - mostly uses elevator but will walk down the stairs Has following equipment at home: shower chair and elevator and triangle rollator  PLOF: Independent - she has been trying to navigate crowds (went to [employer] and [employer])  PATIENT GOALS: my balance  OBJECTIVE:  Note: Objective measures were completed at Evaluation unless otherwise noted.  DIAGNOSTIC FINDINGS:  Brain MRI 07/11/2024: IMPRESSION: 1. Evolving early subacute bilateral cerebellar infarcts, left slightly worse than right. Possible mild interval expansion versus changes of Wallerian degeneration (favored) involving the midbrain since previous. Associated petechial hemorrhage without frank hemorrhagic transformation. 2. Loss of normal flow void within the left vertebral artery, consistent with previously identified dissection. 3. Otherwise stable and normal brain MRI.  COGNITION: Overall cognitive status: Impaired and delayed processing and speech impairments/slurred speech   SENSATION: Light touch: WFL  COORDINATION: BLE RAMS:  LLE fatigues and slows with prolonged task Heel-to-shin:  mildly dysmetric LLE  EDEMA:  None noted in BLE  MUSCLE TONE: None noted in LLE  POSTURE: forward head - very mild  LOWER EXTREMITY ROM:     Active  Right Eval Left Eval  Hip flexion Grossly WNL  Hip extension   Hip abduction   Hip adduction   Hip internal rotation   Hip external rotation   Knee flexion   Knee extension   Ankle dorsiflexion   Ankle plantarflexion    Ankle inversion    Ankle eversion     (Blank rows = not tested)  LOWER  EXTREMITY MMT:    MMT Right Eval Left Eval  Hip flexion 5 4+  Hip extension    Hip abduction 4+ 4  Hip adduction    Hip internal rotation    Hip external rotation    Knee flexion 5 4+  Knee extension 5 5  Ankle dorsiflexion 5 5  Ankle plantarflexion    Ankle inversion    Ankle eversion    (Blank rows = not tested)  BED MOBILITY:  Findings: Sit to supine Complete Independence Supine to  sit Complete Independence Rolling to Right Complete Independence Rolling to Left Complete Independence  TRANSFERS: Sit to stand: Complete Independence  Assistive device utilized: None     Stand to sit: Complete Independence  Assistive device utilized: None     Chair to chair: SBA  Assistive device utilized: None       RAMP:  Not tested  CURB:  Not tested  STAIRS: Not tested GAIT: Findings: Gait Characteristics: step through pattern, decreased arm swing- Left, decreased stride length, and genu recurvatum- Left, Distance walked: various clinic distances, Assistive device utilized:None, Level of assistance: SBA and CGA, and Comments: mild lateral left knee thrust in stance  FUNCTIONAL TESTS:  5 times sit to stand: 14.54 sec no UE support 10 meter walk test: 12.94 sec no AD CGA = 0.77 m/sec OR 2.55 ft/sec Functional gait assessment:  FUNCTIONAL GAIT ASSESSMENT  Date: 08/19/2024 Score  GAIT LEVEL SURFACE Instructions: Walk at your normal speed from here to the next mark (6 m) [20 ft]. (2) Mild impairment - Walks 6 m (20 ft) in less than 7 seconds but greater than 5.5 seconds, uses assistive device, slower speed, mild gait deviations, or deviates 15.24 -25.4 cm (6 -10 in) outside of the 30.48-cm (12-in) walkway width.  2.   CHANGE IN GAIT SPEED Instructions: Begin walking at your normal pace (for 1.5 m [5 ft]). When I tell you "go," walk as fast as you can (for 1.5 m [5 ft]). When I tell you "slow," walk as slowly as you can (for 1.5 m [5 ft]. (3) Normal - Able to smoothly change walking  speed without loss of balance or gait deviation. Shows a significant difference in walking speeds between normal, fast, and slow speeds. Deviates no more than 15.24 cm (6 in) outside of the 30.48-cm (12-in) walkway width.  3.    GAIT WITH HORIZONTAL HEAD TURNS Instructions: Walk from here to the next mark 6 m (20 ft) away. Begin walking at your normal pace. Keep walking straight; after 3 steps, turn your head to the right and keep walking straight while looking to the right. After 3 more steps, turn your head to the left and keep walking straight while looking left. Continue alternating looking right and left. (2) Mild impairment - Performs head turns smoothly with slight change in gait velocity (eg, minor disruption to smooth gait path), deviates 15.24 -25.4 cm (6 -10 in) outside 30.48-cm (12-in) walkway width, or uses an assistive device.  4.   GAIT WITH VERTICAL HEAD TURNS Instructions: Walk from here to the next mark (6 m [20 ft]). Begin walking at your normal pace. Keep walking straight; after 3 steps, tip your head up and keep walking straight while looking up. After 3 more steps, tip your head down, keep walking straight while looking down. Continue  alternating looking up and down every 3 steps until you have completed 2 repetitions in each direction. (3) Normal - Performs head turns with no change in gait. Deviates no more than 15.24 cm (6 in) outside 30.48-cm (12-in) walkway width.  5.  GAIT AND PIVOT TURN Instructions: Begin with walking at your normal pace. When I tell you, "turn and stop," turn as quickly as you can to face the opposite direction and stop. (1) Moderate impairment - Turns slowly, requires verbal cueing, or requires several small steps to catch balance following turn and stop  6.   STEP OVER OBSTACLE Instructions: Begin walking at your normal speed. When you come to the shoe box,  step over it, not around it, and keep walking. (1) Moderate impairment - Is able to step over one shoe  box (11.43 cm [4.5 in] total height) but must slow down and adjust steps to clear box safely. May require verbal cueing.  7.   GAIT WITH NARROW BASE OF SUPPORT Instructions: Walk on the floor with arms folded across the chest, feet aligned heel to toe in tandem for a distance of 3.6 m [12 ft]. The number of steps taken in a straight line are counted for a maximum of 10 steps. (2) Mild impairment - Ambulates 7-9 steps  8.   GAIT WITH EYES CLOSED Instructions: Walk at your normal speed from here to the next mark (6 m [20 ft]) with your eyes closed. (2) Mild impairment - Walks 6 m (20 ft), uses assistive device, slower speed, mild gait deviations, deviates 15.24 -25.4 cm (6 -10 in) outside 30.48-cm (12-in) walkway width. Ambulates 6 m (20 ft) in less than 9 seconds but greater than 7 seconds  9.   AMBULATING BACKWARDS Instructions: Walk backwards until I tell you to stop (2) Mild impairment - Walks 6 m (20 ft), uses assistive device, slower speed, mild gait deviations, deviates 15.24 -25.4 cm (6 -10 in) outside 30.48-cm (12-in) walkway width  10. STEPS Instructions: Walk up these stairs as you would at home (ie, using the rail if necessary). At the top turn around and walk down. (3) Normal-Alternating feet, no rail.  Total 21/30   Interpretation of scores: Non-Specific Older Adults Cutoff Score: <=22/30 = risk of falls Parkinson's Disease Cutoff score <15/30= fall risk (Hoehn & Yahr 1-4)  Minimally Clinically Important Difference (MCID)  Stroke (acute, subacute, and chronic) = MDC: 4.2 points Vestibular (acute) = MDC: 6 points Community Dwelling Older Adults =  MCID: 4 points Parkinson's Disease  =  MDC: 4.3 points  (Academy of Neurologic Physical Therapy (nd). Functional Gait Assessment. Retrieved from https://www.neuropt.org/docs/default-source/cpgs/core-outcome-measures/function-gait-assessment-pocket-guide-proof9-(2).pdf?sfvrsn=b45f35043_0.)  PATIENT SURVEYS:  None relevant to chief complaint  and age range.                                                                                                                              TREATMENT DATE: 08/30/2024  -Treadmill training x8 minutes up to 3% incline at 2. SBA using front oriented BUE support working on symmetrical stride, left knee control, and maintained heel strike, mild left foot scuff initially w/ improvement as task progressed -Attempted to utilize and assess knee cage but one available in clinic is too large for pt.  (Would need 26 cm circumference) -Wrapped left knee w/ ace bandage to assess if proprioceptive compression helpful for improved knee control without success (x115' level surfaces) -Attempted 1/2 heel wedge in left shoe w/ continued notable lateral thrust at knee (x115' level surfaces) - no foot scuff -Attempted Left [place] walk on AFO x115' over level surfaces - better thrust control  PATIENT EDUCATION: Education details: Continue HEP.  Aquatic scheduling -  depth of pool and can choose how deep she is comfortable standing in - she is already scheduled.  Wear pants PT can access hamstrings to trial taping for muscle activation.  PT to reach out to orthotist regarding custom AFO to allow some PF vs knee cage. Person educated: Patient Education method: Explanation, Demonstration, Verbal cues, and Handouts Education comprehension: verbalized understanding and needs further education  HOME EXERCISE PROGRAM: Access Code: [email or web address] URL: [email or web address] Date: 08/19/2024 Prepared by: Daved Bull  Exercises - Seated Hamstring Curls with Resistance  - 1 x daily - 7 x weekly - 2 sets - 10 reps - Standing Hamstring Curl with Resistance  - 1 x daily - 7 x weekly - 2 sets - 10 reps  GOALS: Goals reviewed with patient? Yes  SHORT TERM GOALS: Target date: 09/16/2024  Pt will be independent and compliant with introductory strength and balance focused HEP in order to maintain functional  progress and improve mobility. Baseline:  Established on eval Goal status: INITIAL  2.  Pt will be assessed for most appropriate hyperextension management w/ edu on appropriate wear or use. Baseline: Discussed bracing vs taping options on eval. Goal status: INITIAL  3.  Pt will initiate aquatic therapy for improved balance management and strengthening. Baseline: To be scheduled. Goal status: INITIAL  4.  Pt will decrease 5xSTS to </=12 seconds w/o UE support in order to demonstrate decreased risk for falls and improved functional bilateral LE strength and power. Baseline: 14.54 sec no UE support Goal status: INITIAL  5.  Pt will demonstrate a gait speed of >/=2.75 feet/sec in order to decrease risk for falls. Baseline: 2.55 ft/sec no AD SBA Goal status: INITIAL  LONG TERM GOALS: Target date: 10/14/2024  Pt will be independent and compliant with advanced and finalized strength and balance focused HEP in order to maintain functional progress and improve mobility. Baseline: Established on eval Goal status: INITIAL  2.  Pt will improve FGA score to >/=26/30 in order to demonstrate improved balance and decreased fall risk. Baseline: 21/30 Goal status: INITIAL  3.  Pt will demonstrate a gait speed of >/=2.95 feet/sec in order to decrease risk for falls. Baseline: 2.55 ft/sec no AD SBA Goal status: INITIAL  4.  Pt will ambulate >/=500 feet without AD independently over level and unlevel surfaces using appropriate knee control in order to promote household and community access. Baseline: ambulates w/ knee hyperextension no AD limited community distances SBA-CGA Goal status: INITIAL  ASSESSMENT:  CLINICAL IMPRESSION: Goal of skilled PT session today on addressing and determining best option for lateral knee thrust of LLE.  Unable to fully determine best option as clinic does not have appropriately size knee cage so PT to reach out to orthotist and attempt to obtain.  Pt does have best  control with walk on [place] AFO of options trialed.  She is open to whatever makes her knee stay aligned as she felt a bit more confident with AFO option that without support. She would benefit from skilled PT to address impairments as noted and progress towards long term goals.  OBJECTIVE IMPAIRMENTS: Abnormal gait, decreased activity tolerance, decreased balance, decreased coordination, decreased knowledge of use of DME, decreased strength, and improper body mechanics.   ACTIVITY LIMITATIONS: lifting, standing, squatting, transfers, and locomotion level  PARTICIPATION LIMITATIONS: driving, shopping, community activity, and occupation  PERSONAL FACTORS: Past/current experiences and 1-2 comorbidities: MDD/panic disorder are also affecting patient's functional outcome.   REHAB POTENTIAL: Excellent  CLINICAL DECISION MAKING: Evolving/moderate complexity  EVALUATION COMPLEXITY: Moderate  PLAN:  PT FREQUENCY: 1x/week  PT DURATION: 8 weeks  PLANNED INTERVENTIONS: [id number]- PT Re-evaluation, 97750- Physical Performance Testing, 97110-Therapeutic exercises, 97530- Therapeutic activity, 97112- Neuromuscular re-education, 97535- Self Care, [id number]- Manual therapy, [phone]- Gait training, [phone]- Orthotic Initial, [phone]- Orthotic/Prosthetic subsequent, [phone]- Aquatic Therapy, [phone]- Electrical stimulation (manual), Patient/Family education, Balance training, Stair training, Taping, Joint mobilization, Vestibular training, and DME instructions  PLAN FOR NEXT SESSION: Expand HEP - work on LLE strength - preventing hyperextension (knee cage vs sleeve vs taping?), SLS and high level stability, treadmill training, step ups and heel taps  Check all possible CPT codes: See Planned Interventions List for Planned CPT Codes    Check all conditions that are expected to impact treatment: Neurological condition and/or seizures, Psychological or psychiatric disorders, and Social determinants of health   If treatment  provided at initial evaluation, no treatment charged due to lack of authorization.    Daved KATHEE Bull, PT, DPT 08/30/2024, 4:22 PM

## 2024-08-30 NOTE — Therapy (Signed)
 OUTPATIENT OCCUPATIONAL THERAPY NEURO EVALUATION  Patient Name: Cassidy Bruce MRN: [id number] DOB:02/06/1996, 28 y.o., female Today's Date: 08/30/2024  PCP: None REFERRING PROVIDER: Carilyn Prentice BRAVO, MD  END OF SESSION:  OT End of Session - 08/30/24 1446     Visit Number 2    Number of Visits 9    Date for Recertification  10/14/24    Authorization Type [employer] MCD- no auth required    OT Start Time 1404    OT Stop Time 1442    OT Time Calculation (min) 38 min    Equipment Utilized During Treatment red putty, Fm materials    Activity Tolerance Patient tolerated treatment well    Behavior During Therapy WFL for tasks assessed/performed           Past Medical History:  Diagnosis Date   ADHD    Depression    Past Surgical History:  Procedure Laterality Date   TRANSESOPHAGEAL ECHOCARDIOGRAM (CATH LAB) N/A 07/07/2024   Procedure: TRANSESOPHAGEAL ECHOCARDIOGRAM;  Surgeon: Lonni Slain, MD;  Location: [facility] INVASIVE CV LAB;  Service: Cardiovascular;  Laterality: N/A;   Patient Active Problem List   Diagnosis Date Noted   Severe episode of recurrent major depressive disorder, without psychotic features (HCC) 07/15/2024   Panic disorder with agoraphobia and moderate panic attacks 07/15/2024   Thromboembolic stroke (HCC) 07/13/2024   PFO (patent foramen ovale) 07/07/2024   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   Hypokalemia 07/06/2024   Stroke (HCC) 07/05/2024   MDD (major depressive disorder), recurrent episode, moderate (HCC) 07/25/2018   Adjustment disorder with mixed anxiety and depressed mood     ONSET DATE: Admit date: 07/13/2024 Discharge date: 08/03/2024  Referral date: 08/16/24  REFERRING DIAG: I63.9 (ICD-10-CM) - Cerebral infarction, unspecified  THERAPY DIAG:  Other lack of coordination  Muscle weakness (generalized)  Attention and concentration deficit  Frontal lobe and executive function deficit  Other symptoms and signs involving the  musculoskeletal system  Rationale for Evaluation and Treatment: Rehabilitation  SUBJECTIVE:   SUBJECTIVE STATEMENT: Pt reported doing well this date. Pt accompanied by: self, aunt dropped her off.  PERTINENT HISTORY: Presented 07/05/2024 with dizziness slurred speech headache and gait abnormality with left-sided weakness. Per family she had went to the gym on Monday, 07/04/2024 for weightlifting as usual no specific problems. Tuesday morning she awoke from sleep with headache and neck pain and by the afternoon patient with increasing dizziness vertigo as well as slurred speech. Cranial CT scan showed no acute intracranial abnormalities new right cerebellar infarct since prior study of 2021 felt to be possibly chronic. 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 Distal V2 segment reconstitution but with attenuated enhancement relative to the contralateral side along the remainder of its course. A 13 mm region of ischemia affecting both cerebral hemispheres worse on the left with no more core infarct. MRI identified acute infarcts within the cerebellar vermis and bilateral cerebellar hemispheres. Most notably large acute infarct present within the superior cerebellar artery territories bilaterally. Posterior fossa mass effect without cerebellar tonsillar herniation or evidence of obstructive hydrocephalus. Petechial hemorrhage within the cerebellar vermis and superior right cerebellar hemisphere.  Other PMH includes: MDD (major depressive disorder), recurrent episode, moderate (HCC)   Severe episode of recurrent major depressive disorder, without psychotic features (HCC)   Panic disorder with agoraphobia and moderate panic attacks PFO Left VA occlusion possible dissection Hyperlipidemia MDD/ADHD Microcytosis  PRECAUTIONS: FallL sided weakness, blood thinners   WEIGHT BEARING RESTRICTIONS: No  PAIN:  Are you having pain? No  FALLS: Has patient fallen in last 6 months? Yes. Number of  falls 1 slid onto the floor when having stroke  LIVING ENVIRONMENT: Lives with: lives alone Lives in: Other hotel  Stairs: No Has following equipment at home: None, pt does report having a walker that has 3 wheels and a pouch  PLOF: Independent and Vocation/Vocational requirements: worked as a comptroller for a [employer]  PATIENT GOALS: I want my balance to be strengthened, and my left arm to not be as loose as it is.   OBJECTIVE:  Note: Objective measures were completed at Evaluation unless otherwise noted.  HAND DOMINANCE: Right  ADLs: Overall ADLs: Independent Equipment: none  IADLs: Shopping: Haven't attempted yet  Light housekeeping: Independent Meal Prep: Hasn't attempted yet  Community mobility: Not driving but was before stroke, aunt brought her to appt this date. Not cleared to drive at this time. Medication management: Reports doing things pretty fine, though sometimes takes with or without food, does not take at same time consistently, especially for meds in the morning as pt tends to sleep late. Financial management: Reports it is a little stressful d/t not working at this time and being unable to pay.  Handwriting: 100% legible and Mild micrographia  MOBILITY STATUS: Independent  POSTURE COMMENTS:  No Significant postural limitations Sitting balance: WNL  ACTIVITY TOLERANCE: Activity tolerance: Reports getting a little more winded   FUNCTIONAL OUTCOME MEASURES: Upper Extremity Functional Scale (UEFS): 63/80, functioning at 78.75%, 21.25% impairment  UPPER EXTREMITY ROM:  WNL  Active ROM Right eval Left eval  Shoulder flexion    Shoulder abduction    Shoulder adduction    Shoulder extension    Shoulder internal rotation    Shoulder external rotation    Elbow flexion    Elbow extension    Wrist flexion    Wrist extension    Wrist ulnar deviation    Wrist radial deviation    Wrist pronation    Wrist supination    (Blank rows = not tested)  UPPER  EXTREMITY MMT:   WNL  MMT Right eval Left eval  Shoulder flexion    Shoulder abduction    Shoulder adduction    Shoulder extension    Shoulder internal rotation    Shoulder external rotation    Middle trapezius    Lower trapezius    Elbow flexion  4  Elbow extension  4  Wrist flexion    Wrist extension    Wrist ulnar deviation    Wrist radial deviation    Wrist pronation    Wrist supination    (Blank rows = not tested)  HAND FUNCTION: Grip strength: Right: 50.9 lbs; Left: 27.3 lbs  COORDINATION: 9 Hole Peg test: Right: 44.26 sec; Left: 75.78 sec (Pt was wearing artificial nails) Box and Blocks:  Right 49 blocks, Left 24 blocks  SENSATION: WFL  EDEMA: none  MUSCLE TONE: LUE: Within functional limits  COGNITION: Overall cognitive status: Within functional limits for tasks assessed  VISION: Subjective report: no changes  Baseline vision: Wears glasses for distance only, however cannot find her glasses at this time Visual history: none  VISION ASSESSMENT: To be further assessed in functional context  Patient has difficulty with following activities due to following visual impairments: can't see well with distance  PERCEPTION: Not tested  PRAXIS: Not tested  OBSERVATIONS: limited grip strength and coordination in affected L hand as well as L shoulder and elbow strength. This is affecting some IADL/ADL tasks as well  as ability to return to work.                                                                                                                             TREATMENT DATE: 08/30/24   Educated pt in theraputty HEP to improve grip strength in L hand. See Pt instructions for details. Messaged manager this date about case mgmt program, awaiting response.  Next educated pt in FM coordination HEP to improve dexterity of L hand. See Pt instructions for details. Modified d/t impaired dexterity of fingers by instead of wadding strips of tissue into balls  completing with entire tissue, and rotating putty container on tabletop with sole use of fingers.   PATIENT EDUCATION: Education details: SEE ABOVE Person educated: Patient Education method: Medical Illustrator Education comprehension: verbalized understanding  HOME EXERCISE PROGRAM: FM coordination and putty (08/30/24: [id number])   GOALS: Goals reviewed with patient? Yes  SHORT TERM GOALS: Target date: 09/16/24  Pt to be independent with HEP for LUE strength and coordination Baseline: New to OP OT Goal status: INITIAL  2.  Pt will improve LUE elbow strength to 5/5 for improved functional transfers and functional use Baseline: 4/5  Goal status: INITIAL  3.  Pt will demonstrate improved function in LUE by scoring at least 34 on Box and Blocks test  Baseline: Right 49 blocks, Left 24 blocks Goal status: INITIAL  4.  Pt will demonstrate improved FM coordination in L hand by completing 9 Hole Peg Test in no more than 60 seconds  Baseline: 9 Hole Peg test: Right: 44.26 sec; Left: 75.78 sec  Goal status: INITIAL  5.  Pt will increase L grip strength by at least 10 lbs for improved functional use  Baseline: 27.3 lbs L hand Goal status: INITIAL   LONG TERM GOALS: Target date: 10/14/24  Pt will demonstrate improved functional capability by scoring no less than 71/80 on UEFS Baseline: 63/80 Goal status: INITIAL  2.  Pt will increase L grip strength by at least 20 pounds for improved ability to open jars during meal prep tasks  Baseline: 27.3 lbs L hand Goal status: INITIAL  3.  Pt will complete light meal prep task with no rest breaks  Baseline: New to OP OT, pt reports not attempting since CVA. Goal status: INITIAL  4.  Pt will demonstrate improved bilateral coordination by a score of no less than 30 seconds on each hand on 9 Hole Peg Test  Baseline: 9 Hole Peg test: Right: 44.26 sec; Left: 75.78 sec (pt was wearing artificial nails)  Goal status:  INITIAL  5. Pt will demonstrate improved function in BUE by scoring at least 50 on Box and Blocks test  Baseline: Right 49 blocks, Left 24 blocks Goal status: INITIAL    ASSESSMENT:  CLINICAL IMPRESSION: Patient is a 28 y.o. female who was seen today for occupational therapy tx or L sided weakness s/p thromboembolic stroke. Hx includes MDD, panic disorder,  HLD, microcytosis. Pt participating well with putty and required some modification of HEP d/t impaired dexterity especially with smaller items. Pt would benefit from continued skilled services to continue to address L sided weakness in order to improve ability to complete ADL/IADL and return to work as able.   PERFORMANCE DEFICITS: in functional skills including ADLs, IADLs, coordination, dexterity, strength, Fine motor control, Gross motor control, body mechanics, endurance, and UE functional use, cognitive skills including attention and energy/drive, and psychosocial skills including coping strategies, environmental adaptation, and habits.   IMPAIRMENTS: are limiting patient from ADLs, IADLs, work, and leisure.   CO-MORBIDITIES: may have co-morbidities  that affects occupational performance. Patient will benefit from skilled OT to address above impairments and improve overall function.  MODIFICATION OR ASSISTANCE TO COMPLETE EVALUATION: Min-Moderate modification of tasks or assist with assess necessary to complete an evaluation.  OT OCCUPATIONAL PROFILE AND HISTORY: Detailed assessment: Review of records and additional review of physical, cognitive, psychosocial history related to current functional performance.  CLINICAL DECISION MAKING: Moderate - several treatment options, min-mod task modification necessary  REHAB POTENTIAL: Good  EVALUATION COMPLEXITY: Moderate   For all possible CPT codes, reference the Planned Interventions line above.     Check all conditions that are expected to impact treatment: {Conditions expected to  impact treatment:Musculoskeletal disorders and Neurological condition and/or seizures   If treatment provided at initial evaluation, no treatment charged due to lack of authorization.      PLAN:  OT FREQUENCY: 1x/week  OT DURATION: 8 weeks  PLANNED INTERVENTIONS: 97168 OT Re-evaluation, 97535 self care/ADL training, 02889 therapeutic exercise, 97530 therapeutic activity, 97112 neuromuscular re-education, 97140 manual therapy, 97760 Orthotic Initial, 97763 Orthotic/Prosthetic subsequent, passive range of motion, functional mobility training, energy conservation, patient/family education, and DME and/or AE instructions  RECOMMENDED OTHER SERVICES: none, pt getting PT and SLP eval  CONSULTED AND AGREED WITH PLAN OF CARE: Patient  PLAN FOR NEXT SESSION: L hand strengthening and coordination tasks LUE theraband exercise as appropriate   Rocky Dutch, OT 08/30/2024, 2:47 PM

## 2024-09-06 ENCOUNTER — Ambulatory Visit: Attending: Physical Medicine & Rehabilitation

## 2024-09-06 DIAGNOSIS — M6281 Muscle weakness (generalized): Secondary | ICD-10-CM | POA: Diagnosis present

## 2024-09-06 DIAGNOSIS — I69354 Hemiplegia and hemiparesis following cerebral infarction affecting left non-dominant side: Secondary | ICD-10-CM | POA: Insufficient documentation

## 2024-09-06 DIAGNOSIS — R2681 Unsteadiness on feet: Secondary | ICD-10-CM | POA: Diagnosis present

## 2024-09-06 DIAGNOSIS — R29898 Other symptoms and signs involving the musculoskeletal system: Secondary | ICD-10-CM | POA: Insufficient documentation

## 2024-09-06 DIAGNOSIS — R208 Other disturbances of skin sensation: Secondary | ICD-10-CM | POA: Diagnosis present

## 2024-09-06 DIAGNOSIS — R278 Other lack of coordination: Secondary | ICD-10-CM | POA: Insufficient documentation

## 2024-09-06 NOTE — Therapy (Signed)
 OUTPATIENT OCCUPATIONAL THERAPY NEURO EVALUATION  Patient Name: Cassidy Bruce MRN: [id number] DOB:[birth year], 28 y.o., female Today's Date: 09/06/2024  PCP: None REFERRING PROVIDER: Carilyn Prentice BRAVO, MD  END OF SESSION:  OT End of Session - 09/06/24 1529     Visit Number 3    Number of Visits 9    Date for Recertification  10/14/24    Authorization Type [employer] MCD- no auth required    Authorization Time Period VL: [place]    OT Start Time 1449    OT Stop Time 1528    OT Time Calculation (min) 39 min    Activity Tolerance Patient tolerated treatment well    Behavior During Therapy WFL for tasks assessed/performed            Past Medical History:  Diagnosis Date   ADHD    Depression    Past Surgical History:  Procedure Laterality Date   TRANSESOPHAGEAL ECHOCARDIOGRAM (CATH LAB) N/A 07/07/2024   Procedure: TRANSESOPHAGEAL ECHOCARDIOGRAM;  Surgeon: Lonni Slain, MD;  Location: [facility] INVASIVE CV LAB;  Service: Cardiovascular;  Laterality: N/A;   Patient Active Problem List   Diagnosis Date Noted   Severe episode of recurrent major depressive disorder, without psychotic features (HCC) 07/15/2024   Panic disorder with agoraphobia and moderate panic attacks 07/15/2024   Thromboembolic stroke (HCC) 07/13/2024   PFO (patent foramen ovale) 07/07/2024   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   Hypokalemia 07/06/2024   Stroke (HCC) 07/05/2024   MDD (major depressive disorder), recurrent episode, moderate (HCC) 07/25/2018   Adjustment disorder with mixed anxiety and depressed mood     ONSET DATE: Admit date: 07/13/2024 Discharge date: 08/03/2024  Referral date: 08/16/24  REFERRING DIAG: I63.9 (ICD-10-CM) - Cerebral infarction, unspecified  THERAPY DIAG:  Other lack of coordination - Plan: AMB Referral [facility] Care Management, CANCELED: AMB Referral [facility] Care Management  Muscle weakness (generalized) - Plan: AMB Referral [facility] Care Management, CANCELED: AMB Referral [facility]  Care Management  Other symptoms and signs involving the musculoskeletal system - Plan: AMB Referral [facility] Care Management, CANCELED: AMB Referral [facility] Care Management  Hemiplegia and hemiparesis following cerebral infarction affecting left non-dominant side (HCC) - Plan: AMB Referral [facility] Care Management, CANCELED: AMB Referral [facility] Care Management  Rationale for Evaluation and Treatment: Rehabilitation  SUBJECTIVE:   SUBJECTIVE STATEMENT: Pt reported doing pretty well this date. Reports keeping up with her putty exercises. Pt accompanied by: self, aunt dropped her off.  PERTINENT HISTORY: Presented 07/05/2024 with dizziness slurred speech headache and gait abnormality with left-sided weakness. Per family she had went to the gym on Monday, 07/04/2024 for weightlifting as usual no specific problems. Tuesday morning she awoke from sleep with headache and neck pain and by the afternoon patient with increasing dizziness vertigo as well as slurred speech. Cranial CT scan showed no acute intracranial abnormalities new right cerebellar infarct since prior study of 2021 felt to be possibly chronic. 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 Distal V2 segment reconstitution but with attenuated enhancement relative to the contralateral side along the remainder of its course. A 13 mm region of ischemia affecting both cerebral hemispheres worse on the left with no more core infarct. MRI identified acute infarcts within the cerebellar vermis and bilateral cerebellar hemispheres. Most notably large acute infarct present within the superior cerebellar artery territories bilaterally. Posterior fossa mass effect without cerebellar tonsillar herniation or evidence of obstructive hydrocephalus. Petechial hemorrhage within the cerebellar vermis and superior right cerebellar hemisphere.  Other PMH includes: MDD (major depressive disorder),  recurrent episode, moderate (HCC)   Severe episode of recurrent major  depressive disorder, without psychotic features (HCC)   Panic disorder with agoraphobia and moderate panic attacks PFO Left VA occlusion possible dissection Hyperlipidemia MDD/ADHD Microcytosis  PRECAUTIONS: FallL sided weakness, blood thinners   WEIGHT BEARING RESTRICTIONS: No  PAIN:  Are you having pain? No  FALLS: Has patient fallen in last 6 months? Yes. 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   OBJECTIVE:  Note: Objective measures were completed at Evaluation unless otherwise noted.  HAND DOMINANCE: Right  ADLs: Overall ADLs: Independent Equipment: none  IADLs: Shopping: Haven't attempted yet  Light housekeeping: Independent Meal Prep: Hasn't attempted yet  Community mobility: Not driving but was before stroke, aunt brought her to appt this date. Not cleared to drive at this time. Medication management: Reports doing things pretty fine, though sometimes takes with or without food, does not take at same time consistently, especially for meds in the morning as pt tends to sleep late. Financial management: Reports it is a little stressful d/t not working at this time and being unable to pay.  Handwriting: 100% legible and Mild micrographia  MOBILITY STATUS: Independent  POSTURE COMMENTS:  No Significant postural limitations Sitting balance: WNL  ACTIVITY TOLERANCE: Activity tolerance: Reports getting a little more winded   FUNCTIONAL OUTCOME MEASURES: Upper Extremity Functional Scale (UEFS): 63/80, functioning at 78.75%, 21.25% impairment  UPPER EXTREMITY ROM:  WNL  Active ROM  Right eval Left eval  Shoulder flexion    Shoulder abduction    Shoulder adduction    Shoulder extension    Shoulder internal rotation    Shoulder external rotation    Elbow flexion    Elbow extension    Wrist flexion    Wrist extension    Wrist ulnar deviation    Wrist radial deviation    Wrist pronation    Wrist supination    (Blank rows = not tested)  UPPER EXTREMITY MMT:   WFL,   MMT Right eval Left eval  Shoulder flexion    Shoulder abduction    Shoulder adduction    Shoulder extension    Shoulder internal rotation    Shoulder external rotation    Middle trapezius    Lower trapezius    Elbow flexion  4  Elbow extension  4  Wrist flexion    Wrist extension    Wrist ulnar deviation    Wrist radial deviation    Wrist pronation    Wrist supination    (Blank rows = not tested)  HAND FUNCTION: Grip strength: Right: 50.9 lbs; Left: 27.3 lbs  COORDINATION: 9 Hole Peg test: Right: 44.26 sec; Left: 75.78 sec (Pt was wearing artificial nails) Box and Blocks:  Right 49 blocks, Left 24 blocks  SENSATION: WFL  EDEMA: none  MUSCLE TONE: LUE: Within functional limits  COGNITION: Overall cognitive status: Within functional limits for tasks assessed  VISION: Subjective report: no changes  Baseline vision: Wears glasses for distance only, however cannot find her glasses at this time Visual history: none  VISION ASSESSMENT:  To be further assessed in functional context  Patient has difficulty with following activities due to following visual impairments: can't see well with distance  PERCEPTION: Not tested  PRAXIS: Not tested  OBSERVATIONS: limited grip strength and coordination in affected L hand as well as L shoulder and elbow strength. This is affecting some IADL/ADL tasks as well as ability to return to work.                                                                                                                             TREATMENT DATE:  09/06/24  Obtained permission from pt to refer to Care Mgmt Program, see Orders attached to this tx note.   Pt engaged in seated therapeutic activity to improve grip strength to carry over with functional transfers and work tasks. With clamp at level 2 resistance with silver coil, removed large pegs from peg board. Min cues to maintain grasp on clamp.   Next completed therapeutic exercise with 7# resistance Digiflex, 2x10 gross grasp. Next educated pt in theraband exercises with medium green resistance theraband to improve L elbow strength for functional transfers. Engaged in additional FM activity completing tip to tip pinch manipulating small game pieces of varying shapes and placing in proper holes. Finally, measured grip strength of L hand. See Goals for updated measurements.     PATIENT EDUCATION: Education details: SEE ABOVE Person educated: Patient Education method: Explanation, Demonstration, Tactile cues, Verbal cues, and Handouts Education comprehension: verbalized understanding, returned demonstration, tactile cues required, and needs further education  HOME EXERCISE PROGRAM: FM coordination and putty (08/30/24: [email or web address]) 09/06/24: Access Code: [email or web address] URL: [email or web address] Date: 09/06/2024 Prepared by: Rocky Dutch  Exercises - Seated Elbow Flexion with Self-Anchored Resistance  - 1 x daily - 5 x weekly - 3 sets - 10 reps - Seated Elbow Extension with Self-Anchored Resistance  - 1 x daily - 5 x weekly - 3 sets - 10 reps - Seated Shoulder Horizontal Abduction with Resistance  - 1 x daily - 5 x weekly - 3 sets - 10 reps - Seated Shoulder Flexion with Self-Anchored Resistance  - 1 x daily - 5 x weekly - 3 sets - 10 reps   GOALS: Goals reviewed with patient? Yes  SHORT TERM GOALS: Target date: 09/16/24  Pt to be independent with HEP for LUE strength and coordination Baseline: New to OP OT Goal status: IN PROGRESS  2.  Pt will improve LUE elbow  strength to 5/5 for improved functional transfers and functional use Baseline: 4/5  Goal status: INITIAL  3.  Pt will demonstrate improved function in LUE by scoring at least 34 on Box and Blocks test  Baseline: Right 49 blocks, Left 24 blocks Goal status: INITIAL  4.  Pt will demonstrate improved FM coordination in L hand by completing 9 Hole Peg Test in no more than 60 seconds  Baseline: 9 Hole Peg test: Right: 44.26 sec; Left: 75.78 sec  Goal status: INITIAL  5.  Pt will increase L grip strength by at least 10 lbs for improved functional use  Baseline: 27.3 lbs L hand 09/06/24: 44.7 lbs L hand Goal status: GOAL MET   LONG TERM GOALS: Target date: 10/14/24  Pt will demonstrate improved functional capability by scoring no less than 71/80 on UEFS Baseline: 63/80 Goal status: INITIAL  2.  Pt will increase L grip strength by at least 20 pounds for improved ability to open jars during meal prep tasks  Baseline: 27.3 lbs L hand 09/06/24: 44.7 lbs L hand Goal status: INITIAL  3.  Pt will complete light meal prep task with no rest breaks  Baseline: New to OP OT, pt reports not attempting since CVA. Goal status: INITIAL  4.  Pt will demonstrate improved bilateral coordination by a score of no less than 30 seconds on each hand on 9 Hole Peg Test  Baseline: 9 Hole Peg test: Right: 44.26 sec; Left: 75.78 sec (pt was wearing artificial nails)  Goal status: INITIAL  5. Pt will demonstrate improved function in BUE by scoring at least 45 on Box and Blocks test  Baseline: Right 49 blocks, Left 24 blocks Goal status: MODIFIED    ASSESSMENT:  CLINICAL IMPRESSION: Patient is a 28 y.o. female who was seen today for occupational therapy tx or L sided weakness s/p thromboembolic stroke. Hx includes MDD, panic disorder, HLD, microcytosis. Pt participating well with theraband exercises and receptive to receiving assistance through care mgmt program. Noted improved grip strength this date. Pt  at this time would benefit from continued skilled OT services to improve ability to complete ADL/IADL and return to PLOF as able.   PERFORMANCE DEFICITS: in functional skills including ADLs, IADLs, coordination, dexterity, strength, Fine motor control, Gross motor control, body mechanics, endurance, and UE functional use, cognitive skills including attention and energy/drive, and psychosocial skills including coping strategies, environmental adaptation, and habits.   IMPAIRMENTS: are limiting patient from ADLs, IADLs, work, and leisure.   CO-MORBIDITIES: may have co-morbidities  that affects occupational performance. Patient will benefit from skilled OT to address above impairments and improve overall function.  MODIFICATION OR ASSISTANCE TO COMPLETE EVALUATION: Min-Moderate modification of tasks or assist with assess necessary to complete an evaluation.  OT OCCUPATIONAL PROFILE AND HISTORY: Detailed assessment: Review of records and additional review of physical, cognitive, psychosocial history related to current functional performance.  CLINICAL DECISION MAKING: Moderate - several treatment options, min-mod task modification necessary  REHAB POTENTIAL: Good  EVALUATION COMPLEXITY: Moderate   For all possible CPT codes, reference the Planned Interventions line above.     Check all conditions that are expected to impact treatment: {Conditions expected to impact treatment:Musculoskeletal disorders and Neurological condition and/or seizures   If treatment provided at initial evaluation, no treatment charged due to lack of authorization.      PLAN:  OT FREQUENCY: 1x/week  OT DURATION: 8 weeks  PLANNED INTERVENTIONS: 97168 OT Re-evaluation, 97535 self care/ADL training, 02889 therapeutic exercise, 97530 therapeutic activity, 97112 neuromuscular re-education, 97140 manual therapy, 97760 Orthotic Initial, 97763 Orthotic/Prosthetic subsequent, passive range of motion, functional mobility  training, energy conservation, patient/family education, and DME and/or AE instructions  RECOMMENDED OTHER SERVICES: none, pt getting PT and SLP eval  CONSULTED AND AGREED WITH PLAN OF CARE: Patient  PLAN FOR NEXT SESSION: L hand strengthening and coordination tasks LUE theraband exercise as appropriate   Rocky Dutch, OT 09/06/2024, 3:31 PM

## 2024-09-08 ENCOUNTER — Ambulatory Visit: Admitting: Physical Therapy

## 2024-09-08 ENCOUNTER — Encounter: Payer: Self-pay | Admitting: Physical Therapy

## 2024-09-08 DIAGNOSIS — M6281 Muscle weakness (generalized): Secondary | ICD-10-CM

## 2024-09-08 DIAGNOSIS — R29898 Other symptoms and signs involving the musculoskeletal system: Secondary | ICD-10-CM

## 2024-09-08 DIAGNOSIS — R278 Other lack of coordination: Secondary | ICD-10-CM

## 2024-09-08 DIAGNOSIS — R2681 Unsteadiness on feet: Secondary | ICD-10-CM

## 2024-09-08 DIAGNOSIS — I69354 Hemiplegia and hemiparesis following cerebral infarction affecting left non-dominant side: Secondary | ICD-10-CM

## 2024-09-08 NOTE — Therapy (Signed)
 OUTPATIENT PHYSICAL THERAPY NEURO TREATMENT   Patient Name: Cassidy Bruce MRN: [id number] DOB:1996-09-10, 28 y.o., female Today's Date: 09/08/2024   PCP: Greig JINNY Drones, NP REFERRING PROVIDER: Carilyn Prentice BRAVO, MD  END OF SESSION:  PT End of Session - 09/08/24 1054     Visit Number 3    Number of Visits 9   8 + eval   Date for Recertification  10/28/24   pushed out due to multi-D scheduling and aquatic needs   Authorization Type [place] Medicaid    PT Start Time 1052    PT Stop Time 1139    PT Time Calculation (min) 47 min    Equipment Utilized During Treatment Other (comment)   aquatic devices as needed for safety and challenge   Activity Tolerance Patient tolerated treatment well    Behavior During Therapy [facility] for tasks assessed/performed          Past Medical History:  Diagnosis Date   ADHD    Depression    Past Surgical History:  Procedure Laterality Date   TRANSESOPHAGEAL ECHOCARDIOGRAM (CATH LAB) N/A 07/07/2024   Procedure: TRANSESOPHAGEAL ECHOCARDIOGRAM;  Surgeon: Lonni Slain, MD;  Location: [facility] INVASIVE CV LAB;  Service: Cardiovascular;  Laterality: N/A;   Patient Active Problem List   Diagnosis Date Noted   Severe episode of recurrent major depressive disorder, without psychotic features (HCC) 07/15/2024   Panic disorder with agoraphobia and moderate panic attacks 07/15/2024   Thromboembolic stroke (HCC) 07/13/2024   PFO (patent foramen ovale) 07/07/2024   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   Hypokalemia 07/06/2024   Stroke (HCC) 07/05/2024   MDD (major depressive disorder), recurrent episode, moderate (HCC) 07/25/2018   Adjustment disorder with mixed anxiety and depressed mood     ONSET DATE: 07/05/2024 (CVA)  REFERRING DIAG: I63.9 (ICD-10-CM) - Cerebral infarction, unspecified  THERAPY DIAG:  Other lack of coordination  Muscle weakness (generalized)  Other symptoms and signs involving the musculoskeletal system  Hemiplegia and  hemiparesis following cerebral infarction affecting left non-dominant side (HCC)  Unsteadiness on feet  Rationale for Evaluation and Treatment: Rehabilitation  SUBJECTIVE:                                                                                                                                                                                             SUBJECTIVE STATEMENT: Pt presents ambulatory independently to [facility] facility for aquatics.  She states she is ready to try this and feels pretty good today.  No pain and no falls.   Pt accompanied by: family member (aunt in lobby - pt does not drive herself)  PERTINENT HISTORY: MDD, CVA, PFO,  panic disorder w/ agoraphobia and moderate panic attacks  Per inpt rehab discharge summary: Presented 07/05/2024 with dizziness slurred speech headache and gait abnormality with left-sided weakness. Per family she had went to the gym on Monday, 07/04/2024 for weightlifting as usual no specific problems. Tuesday morning she awoke from sleep with headache and neck pain and by the afternoon patient with increasing dizziness vertigo as well as slurred speech. Cranial CT scan showed no acute intracranial abnormalities new right cerebellar infarct since prior study of 2021 felt to be possibly chronic. 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 Distal V2 segment reconstitution but with attenuated enhancement relative to the contralateral side along the remainder of its course. A 13 mm region of ischemia affecting both cerebral hemispheres worse on the left with no more core infarct. MRI identified acute infarcts within the cerebellar vermis and bilateral cerebellar hemispheres. Most notably large acute infarct present within the superior cerebellar artery territories bilaterally. Posterior fossa mass effect without cerebellar tonsillar herniation or evidence of obstructive hydrocephalus. Petechial hemorrhage within the cerebellar vermis and superior right  cerebellar hemisphere. Patient did not receive TNK. No thrombectomy needed for left VA occlusion as BA was patent.  PAIN:  Are you having pain? No  PRECAUTIONS: Fall and Other: Zio heart monitor  RED FLAGS: None   WEIGHT BEARING RESTRICTIONS: No  FALLS: Has patient fallen in last 6 months? No and day of CVA she slid to the floor to lay down  LIVING ENVIRONMENT: Lives with: lives alone Lives in: Transitional housing - hotel Stairs: No - mostly uses elevator but will walk down the stairs Has following equipment at home: shower chair and elevator and triangle rollator  PLOF: Independent - she has been trying to navigate crowds (went to [employer] and [employer])  PATIENT GOALS: my balance  OBJECTIVE:  Note: Objective measures were completed at Evaluation unless otherwise noted.  DIAGNOSTIC FINDINGS:  Brain MRI 07/11/2024: IMPRESSION: 1. Evolving early subacute bilateral cerebellar infarcts, left slightly worse than right. Possible mild interval expansion versus changes of Wallerian degeneration (favored) involving the midbrain since previous. Associated petechial hemorrhage without frank hemorrhagic transformation. 2. Loss of normal flow void within the left vertebral artery, consistent with previously identified dissection. 3. Otherwise stable and normal brain MRI.  COGNITION: Overall cognitive status: Impaired and delayed processing and speech impairments/slurred speech   SENSATION: Light touch: WFL  COORDINATION: BLE RAMS:  LLE fatigues and slows with prolonged task Heel-to-shin:  mildly dysmetric LLE  EDEMA:  None noted in BLE  MUSCLE TONE: None noted in LLE  POSTURE: forward head - very mild  LOWER EXTREMITY ROM:     Active  Right Eval Left Eval  Hip flexion Grossly WNL  Hip extension   Hip abduction   Hip adduction   Hip internal rotation   Hip external rotation   Knee flexion   Knee extension   Ankle dorsiflexion   Ankle plantarflexion     Ankle inversion    Ankle eversion     (Blank rows = not tested)  LOWER EXTREMITY MMT:    MMT Right Eval Left Eval  Hip flexion 5 4+  Hip extension    Hip abduction 4+ 4  Hip adduction    Hip internal rotation    Hip external rotation    Knee flexion 5 4+  Knee extension 5 5  Ankle dorsiflexion 5 5  Ankle plantarflexion    Ankle inversion    Ankle eversion    (Blank rows = not  tested)  BED MOBILITY:  Findings: Sit to supine Complete Independence Supine to sit Complete Independence Rolling to Right Complete Independence Rolling to Left Complete Independence  TRANSFERS: Sit to stand: Complete Independence  Assistive device utilized: None     Stand to sit: Complete Independence  Assistive device utilized: None     Chair to chair: SBA  Assistive device utilized: None       RAMP:  Not tested  CURB:  Not tested  STAIRS: Not tested GAIT: Findings: Gait Characteristics: step through pattern, decreased arm swing- Left, decreased stride length, and genu recurvatum- Left, Distance walked: various clinic distances, Assistive device utilized:None, Level of assistance: SBA and CGA, and Comments: mild lateral left knee thrust in stance  FUNCTIONAL TESTS:  5 times sit to stand: 14.54 sec no UE support 10 meter walk test: 12.94 sec no AD CGA = 0.77 m/sec OR 2.55 ft/sec Functional gait assessment:  FUNCTIONAL GAIT ASSESSMENT  Date: 08/19/2024 Score  GAIT LEVEL SURFACE Instructions: Walk at your normal speed from here to the next mark (6 m) [20 ft]. (2) Mild impairment - Walks 6 m (20 ft) in less than 7 seconds but greater than 5.5 seconds, uses assistive device, slower speed, mild gait deviations, or deviates 15.24 -25.4 cm (6 -10 in) outside of the 30.48-cm (12-in) walkway width.  2.   CHANGE IN GAIT SPEED Instructions: Begin walking at your normal pace (for 1.5 m [5 ft]). When I tell you "go," walk as fast as you can (for 1.5 m [5 ft]). When I tell you "slow," walk as slowly as  you can (for 1.5 m [5 ft]. (3) Normal - Able to smoothly change walking speed without loss of balance or gait deviation. Shows a significant difference in walking speeds between normal, fast, and slow speeds. Deviates no more than 15.24 cm (6 in) outside of the 30.48-cm (12-in) walkway width.  3.    GAIT WITH HORIZONTAL HEAD TURNS Instructions: Walk from here to the next mark 6 m (20 ft) away. Begin walking at your normal pace. Keep walking straight; after 3 steps, turn your head to the right and keep walking straight while looking to the right. After 3 more steps, turn your head to the left and keep walking straight while looking left. Continue alternating looking right and left. (2) Mild impairment - Performs head turns smoothly with slight change in gait velocity (eg, minor disruption to smooth gait path), deviates 15.24 -25.4 cm (6 -10 in) outside 30.48-cm (12-in) walkway width, or uses an assistive device.  4.   GAIT WITH VERTICAL HEAD TURNS Instructions: Walk from here to the next mark (6 m [20 ft]). Begin walking at your normal pace. Keep walking straight; after 3 steps, tip your head up and keep walking straight while looking up. After 3 more steps, tip your head down, keep walking straight while looking down. Continue  alternating looking up and down every 3 steps until you have completed 2 repetitions in each direction. (3) Normal - Performs head turns with no change in gait. Deviates no more than 15.24 cm (6 in) outside 30.48-cm (12-in) walkway width.  5.  GAIT AND PIVOT TURN Instructions: Begin with walking at your normal pace. When I tell you, "turn and stop," turn as quickly as you can to face the opposite direction and stop. (1) Moderate impairment - Turns slowly, requires verbal cueing, or requires several small steps to catch balance following turn and stop  6.   STEP OVER OBSTACLE Instructions:  Begin walking at your normal speed. When you come to the shoe box, step over it, not around it,  and keep walking. (1) Moderate impairment - Is able to step over one shoe box (11.43 cm [4.5 in] total height) but must slow down and adjust steps to clear box safely. May require verbal cueing.  7.   GAIT WITH NARROW BASE OF SUPPORT Instructions: Walk on the floor with arms folded across the chest, feet aligned heel to toe in tandem for a distance of 3.6 m [12 ft]. The number of steps taken in a straight line are counted for a maximum of 10 steps. (2) Mild impairment - Ambulates 7-9 steps  8.   GAIT WITH EYES CLOSED Instructions: Walk at your normal speed from here to the next mark (6 m [20 ft]) with your eyes closed. (2) Mild impairment - Walks 6 m (20 ft), uses assistive device, slower speed, mild gait deviations, deviates 15.24 -25.4 cm (6 -10 in) outside 30.48-cm (12-in) walkway width. Ambulates 6 m (20 ft) in less than 9 seconds but greater than 7 seconds  9.   AMBULATING BACKWARDS Instructions: Walk backwards until I tell you to stop (2) Mild impairment - Walks 6 m (20 ft), uses assistive device, slower speed, mild gait deviations, deviates 15.24 -25.4 cm (6 -10 in) outside 30.48-cm (12-in) walkway width  10. STEPS Instructions: Walk up these stairs as you would at home (ie, using the rail if necessary). At the top turn around and walk down. (3) Normal-Alternating feet, no rail.  Total 21/30   Interpretation of scores: Non-Specific Older Adults Cutoff Score: <=22/30 = risk of falls Parkinson's Disease Cutoff score <15/30= fall risk (Hoehn & Yahr 1-4)  Minimally Clinically Important Difference (MCID)  Stroke (acute, subacute, and chronic) = MDC: 4.2 points Vestibular (acute) = MDC: 6 points Community Dwelling Older Adults =  MCID: 4 points Parkinson's Disease  =  MDC: 4.3 points  (Academy of Neurologic Physical Therapy (nd). Functional Gait Assessment. Retrieved from  https://www.neuropt.org/docs/default-source/cpgs/core-outcome-measures/function-gait-assessment-pocket-guide-proof9-(2).pdf?sfvrsn=b27f35043_0.)  PATIENT SURVEYS:  None relevant to chief complaint and age range.                                                                                                                              TREATMENT DATE: 09/08/2024  Aquatic therapy at [facility] - pool temperature 92 degrees   Patient seen for aquatic therapy today.  Treatment took place in water  3.6-4.8 feet deep depending upon activity.  Patient entered and exited the pool via stairs reciprocally using bil rails at SBA level.   Exercises: -Water  walking warmup 4x18 ft unsupported forward > backward > laterally -Lateral stepouts w/ multicolor low resistance dumbbell ABD/ADD 4x18 ft -Forward 6 step ups unsupported x20 alt LE > lateral 6 step ups unsupported x10 each LE -6 heel taps w/ BUE support 2x10 each side, cued to improve knee alignment -Squats progressing away from UE support 2x12 -Staggered STS LLE in rear 2x15  progressing away from UE support -Large hip circles x10 each LE in standing w/ unilateral wall support -SL squats on LLE 2x8 -Plank kickbacks x20  Patient requires buoyancy of the water  for support for reduced fall risk with gait training and balance exercises with SBA support, min return demo and verbal cues for form. Exercises able to be performed safely in water  without the risk of fall compared to those same exercises performed on land; viscosity of water  needed for resistance for strengthening. Current of water  provides perturbations for challenging static and dynamic balance.    PATIENT EDUCATION: Education details: Continue HEP.  Reminders of next OT/PT appts. Person educated: Patient Education method: Explanation, Demonstration, Verbal cues, and Handouts Education comprehension: verbalized understanding and needs further education  HOME EXERCISE PROGRAM: Access  Code: [email or web address] URL: [email or web address] Date: 08/19/2024 Prepared by: Daved Bull  Exercises - Seated Hamstring Curls with Resistance  - 1 x daily - 7 x weekly - 2 sets - 10 reps - Standing Hamstring Curl with Resistance  - 1 x daily - 7 x weekly - 2 sets - 10 reps  GOALS: Goals reviewed with patient? Yes  SHORT TERM GOALS: Target date: 09/16/2024  Pt will be independent and compliant with introductory strength and balance focused HEP in order to maintain functional progress and improve mobility. Baseline:  Established on eval Goal status: INITIAL  2.  Pt will be assessed for most appropriate hyperextension management w/ edu on appropriate wear or use. Baseline: Discussed bracing vs taping options on eval. Goal status: INITIAL  3.  Pt will initiate aquatic therapy for improved balance management and strengthening. Baseline: To be scheduled. Goal status: INITIAL  4.  Pt will decrease 5xSTS to </=12 seconds w/o UE support in order to demonstrate decreased risk for falls and improved functional bilateral LE strength and power. Baseline: 14.54 sec no UE support Goal status: INITIAL  5.  Pt will demonstrate a gait speed of >/=2.75 feet/sec in order to decrease risk for falls. Baseline: 2.55 ft/sec no AD SBA Goal status: INITIAL  LONG TERM GOALS: Target date: 10/14/2024  Pt will be independent and compliant with advanced and finalized strength and balance focused HEP in order to maintain functional progress and improve mobility. Baseline: Established on eval Goal status: INITIAL  2.  Pt will improve FGA score to >/=26/30 in order to demonstrate improved balance and decreased fall risk. Baseline: 21/30 Goal status: INITIAL  3.  Pt will demonstrate a gait speed of >/=2.95 feet/sec in order to decrease risk for falls. Baseline: 2.55 ft/sec no AD SBA Goal status: INITIAL  4.  Pt will ambulate >/=500 feet without AD independently over level and unlevel  surfaces using appropriate knee control in order to promote household and community access. Baseline: ambulates w/ knee hyperextension no AD limited community distances SBA-CGA Goal status: INITIAL  ASSESSMENT:  CLINICAL IMPRESSION: Focus of skilled PT session today on initiating aquatic focused balance and strengthening.  She demonstrated improved SLS in this setting able to tolerate progression to unsupported variations of several movements w/o significant LOB.  She continues to benefit from LLE NMR to re-establish proper knee positioning in SLS to prevent ongoing hyperextension mechanics.  PT to continue per POC - may establish aquatic HEP if pt is able to access this environment post-discharge.   OBJECTIVE IMPAIRMENTS: Abnormal gait, decreased activity tolerance, decreased balance, decreased coordination, decreased knowledge of use of DME, decreased strength, and improper body mechanics.   ACTIVITY LIMITATIONS: lifting, standing, squatting, transfers, and locomotion  level  PARTICIPATION LIMITATIONS: driving, shopping, community activity, and occupation  PERSONAL FACTORS: Past/current experiences and 1-2 comorbidities: MDD/panic disorder are also affecting patient's functional outcome.   REHAB POTENTIAL: Excellent  CLINICAL DECISION MAKING: Evolving/moderate complexity  EVALUATION COMPLEXITY: Moderate  PLAN:  PT FREQUENCY: 1x/week  PT DURATION: 8 weeks  PLANNED INTERVENTIONS: [id number]- PT Re-evaluation, 97750- Physical Performance Testing, 97110-Therapeutic exercises, 97530- Therapeutic activity, 97112- Neuromuscular re-education, 97535- Self Care, [id number]- Manual therapy, [phone]- Gait training, [phone]- Orthotic Initial, [phone]- Orthotic/Prosthetic subsequent, [phone]- Aquatic Therapy, [phone]- Electrical stimulation (manual), Patient/Family education, Balance training, Stair training, Taping, Joint mobilization, Vestibular training, and DME instructions  PLAN FOR NEXT SESSION: Expand HEP -  work on LLE strength - preventing hyperextension (knee cage vs sleeve vs taping?), SLS and high level stability, treadmill training, step ups and heel taps  Aquatics:  Walking mechanics, forward T, falling star, tree pose, lunges > add step, SL elevated STS, core, tandem walking  Check all possible CPT codes: See Planned Interventions List for Planned CPT Codes    Check all conditions that are expected to impact treatment: Neurological condition and/or seizures, Psychological or psychiatric disorders, and Social determinants of health   If treatment provided at initial evaluation, no treatment charged due to lack of authorization.    Daved KATHEE Bull, PT, DPT 09/08/2024, 11:40 AM

## 2024-09-12 ENCOUNTER — Ambulatory Visit

## 2024-09-12 DIAGNOSIS — I69354 Hemiplegia and hemiparesis following cerebral infarction affecting left non-dominant side: Secondary | ICD-10-CM

## 2024-09-12 DIAGNOSIS — R278 Other lack of coordination: Secondary | ICD-10-CM | POA: Diagnosis not present

## 2024-09-12 DIAGNOSIS — R208 Other disturbances of skin sensation: Secondary | ICD-10-CM

## 2024-09-12 DIAGNOSIS — R29898 Other symptoms and signs involving the musculoskeletal system: Secondary | ICD-10-CM

## 2024-09-12 DIAGNOSIS — M6281 Muscle weakness (generalized): Secondary | ICD-10-CM

## 2024-09-12 NOTE — Therapy (Signed)
 OUTPATIENT OCCUPATIONAL THERAPY NEURO EVALUATION  Patient Name: Cassidy Bruce MRN: [id number] DOB:02/04/1996, 28 y.o., female Today's Date: 09/12/2024  PCP: None REFERRING PROVIDER: Carilyn Prentice BRAVO, MD  END OF SESSION:  OT End of Session - 09/12/24 1517     Visit Number 4    Number of Visits 9    Date for Recertification  10/14/24    Authorization Type [employer] MCD- no auth required    Authorization Time Period VL: [place]    OT Start Time 1449    OT Stop Time 1528    OT Time Calculation (min) 39 min    Activity Tolerance Patient tolerated treatment well    Behavior During Therapy WFL for tasks assessed/performed             Past Medical History:  Diagnosis Date   ADHD    Depression    Past Surgical History:  Procedure Laterality Date   TRANSESOPHAGEAL ECHOCARDIOGRAM (CATH LAB) N/A 07/07/2024   Procedure: TRANSESOPHAGEAL ECHOCARDIOGRAM;  Surgeon: Lonni Slain, MD;  Location: [facility] INVASIVE CV LAB;  Service: Cardiovascular;  Laterality: N/A;   Patient Active Problem List   Diagnosis Date Noted   Severe episode of recurrent major depressive disorder, without psychotic features (HCC) 07/15/2024   Panic disorder with agoraphobia and moderate panic attacks 07/15/2024   Thromboembolic stroke (HCC) 07/13/2024   PFO (patent foramen ovale) 07/07/2024   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   Hypokalemia 07/06/2024   Stroke (HCC) 07/05/2024   MDD (major depressive disorder), recurrent episode, moderate (HCC) 07/25/2018   Adjustment disorder with mixed anxiety and depressed mood     ONSET DATE: Admit date: 07/13/2024 Discharge date: 08/03/2024  Referral date: 08/16/24  REFERRING DIAG: I63.9 (ICD-10-CM) - Cerebral infarction, unspecified  THERAPY DIAG:  Other lack of coordination  Muscle weakness (generalized)  Other disturbances of skin sensation  Other symptoms and signs involving the musculoskeletal system  Hemiplegia and hemiparesis following cerebral  infarction affecting left non-dominant side (HCC)  Rationale for Evaluation and Treatment: Rehabilitation  SUBJECTIVE:   SUBJECTIVE STATEMENT: Pt reports things going alright. I'm using the left hand more than I was before, but it is a little jumpy, it's not as steady as the right one. Pt accompanied by: self, aunt dropped her off.  PERTINENT HISTORY: Presented 07/05/2024 with dizziness slurred speech headache and gait abnormality with left-sided weakness. Per family she had went to the gym on Monday, 07/04/2024 for weightlifting as usual no specific problems. Tuesday morning she awoke from sleep with headache and neck pain and by the afternoon patient with increasing dizziness vertigo as well as slurred speech. Cranial CT scan showed no acute intracranial abnormalities new right cerebellar infarct since prior study of 2021 felt to be possibly chronic. 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 Distal V2 segment reconstitution but with attenuated enhancement relative to the contralateral side along the remainder of its course. A 13 mm region of ischemia affecting both cerebral hemispheres worse on the left with no more core infarct. MRI identified acute infarcts within the cerebellar vermis and bilateral cerebellar hemispheres. Most notably large acute infarct present within the superior cerebellar artery territories bilaterally. Posterior fossa mass effect without cerebellar tonsillar herniation or evidence of obstructive hydrocephalus. Petechial hemorrhage within the cerebellar vermis and superior right cerebellar hemisphere.  Other PMH includes: MDD (major depressive disorder), recurrent episode, moderate (HCC)   Severe episode of recurrent major depressive disorder, without psychotic features (HCC)   Panic disorder with agoraphobia and moderate panic attacks PFO Left VA  occlusion possible dissection Hyperlipidemia MDD/ADHD Microcytosis  PRECAUTIONS: FallL sided weakness, blood  thinners   WEIGHT BEARING RESTRICTIONS: No  PAIN:  Are you having pain? No  FALLS: Has patient fallen in last 6 months? Yes. 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   OBJECTIVE:  Note: Objective measures were completed at Evaluation unless otherwise noted.  HAND DOMINANCE: Right  ADLs: Overall ADLs: Independent Equipment: none  IADLs: Shopping: Haven't attempted yet  Light housekeeping: Independent Meal Prep: Hasn't attempted yet  Community mobility: Not driving but was before stroke, aunt brought her to appt this date. Not cleared to drive at this time. Medication management: Reports doing things pretty fine, though sometimes takes with or without food, does not take at same time consistently, especially for meds in the morning as pt tends to sleep late. Financial management: Reports it is a little stressful d/t not working at this time and being unable to pay.  Handwriting: 100% legible and Mild micrographia  MOBILITY STATUS: Independent  POSTURE COMMENTS:  No Significant postural limitations Sitting balance: WNL  ACTIVITY TOLERANCE: Activity tolerance: Reports getting a little more winded   FUNCTIONAL OUTCOME MEASURES: Upper Extremity Functional Scale (UEFS): 63/80, functioning at 78.75%, 21.25% impairment  UPPER EXTREMITY ROM:  WNL  Active ROM Right eval Left eval  Shoulder flexion    Shoulder abduction    Shoulder adduction    Shoulder extension    Shoulder internal rotation    Shoulder external rotation    Elbow flexion    Elbow extension    Wrist flexion    Wrist  extension    Wrist ulnar deviation    Wrist radial deviation    Wrist pronation    Wrist supination    (Blank rows = not tested)  UPPER EXTREMITY MMT:   WFL,   MMT Right eval Left eval  Shoulder flexion    Shoulder abduction    Shoulder adduction    Shoulder extension    Shoulder internal rotation    Shoulder external rotation    Middle trapezius    Lower trapezius    Elbow flexion  4  Elbow extension  4  Wrist flexion    Wrist extension    Wrist ulnar deviation    Wrist radial deviation    Wrist pronation    Wrist supination    (Blank rows = not tested)  HAND FUNCTION: Grip strength: Right: 50.9 lbs; Left: 27.3 lbs  COORDINATION: 9 Hole Peg test: Right: 44.26 sec; Left: 75.78 sec (Pt was wearing artificial nails) Box and Blocks:  Right 49 blocks, Left 24 blocks  SENSATION: WFL  EDEMA: none  MUSCLE TONE: LUE: Within functional limits  COGNITION: Overall cognitive status: Within functional limits for tasks assessed  VISION: Subjective report: no changes  Baseline vision: Wears glasses for distance only, however cannot find her glasses at this time Visual history: none  VISION ASSESSMENT: To be further assessed in functional context  Patient has difficulty with following activities due to following visual impairments: can't see well with distance  PERCEPTION: Not tested  PRAXIS:  Not tested  OBSERVATIONS: limited grip strength and coordination in affected L hand as well as L shoulder and elbow strength. This is affecting some IADL/ADL tasks as well as ability to return to work.                                                                                                                             TREATMENT DATE: 09/12/24  Re-assessed 9HPT and Box and Blocks for L hand, see Goals for updated measures.   Pt participated in activities honing FM coordination of L hand, completing tip to tip pinch of L hand recreating pattern on pegboard with small  pegs.  Next inserted notched pegs into pegboard, and finally completed block stacking game with sole use of L hand. Encouraged pt to focus on coordination activities previously issued, pt agreed.     PATIENT EDUCATION: Education details: SEE ABOVE Person educated: Patient Education method: Explanation, Demonstration, Tactile cues, Verbal cues, and Handouts Education comprehension: verbalized understanding, returned demonstration, tactile cues required, and needs further education  HOME EXERCISE PROGRAM: FM coordination and putty (08/30/24: [email or web address]) 09/06/24: Access Code: [email or web address] URL: [email or web address] Date: 09/06/2024 Prepared by: Rocky Dutch  Exercises - Seated Elbow Flexion with Self-Anchored Resistance  - 1 x daily - 5 x weekly - 3 sets - 10 reps - Seated Elbow Extension with Self-Anchored Resistance  - 1 x daily - 5 x weekly - 3 sets - 10 reps - Seated Shoulder Horizontal Abduction with Resistance  - 1 x daily - 5 x weekly - 3 sets - 10 reps - Seated Shoulder Flexion with Self-Anchored Resistance  - 1 x daily - 5 x weekly - 3 sets - 10 reps   GOALS: Goals reviewed with patient? Yes  SHORT TERM GOALS: Target date: 09/16/24  Pt to be independent with HEP for LUE strength and coordination Baseline: New to OP OT Goal status: IN PROGRESS  2.  Pt will improve LUE elbow strength to 5/5 for improved functional transfers and functional use Baseline: 4/5  09/12/24: 5/5  Goal status: GOAL MET  3.  Pt will demonstrate improved function in LUE by scoring at least 34 on Box and Blocks test  Baseline: Right 49 blocks, Left 24 blocks   09/12/24: 27 blocks Goal status: IN PROGRESS  4.  Pt will demonstrate improved FM coordination in L hand by completing 9 Hole Peg Test in no more than 60 seconds  Baseline: 9 Hole Peg test: Right: 44.26 sec; Left: 75.78 sec 09/12/24: Left: 77 seconds, did drop 2 when taking them out  Goal status: INITIAL  5.  Pt will  increase L grip strength by at least 10 lbs for improved functional use  Baseline: 27.3 lbs L hand 09/06/24: 44.7 lbs L hand Goal status: GOAL MET   LONG TERM GOALS: Target date: 10/14/24  Pt will demonstrate improved functional capability by scoring no less than 71/80 on UEFS Baseline: 63/80 Goal status: INITIAL  2.  Pt will increase L grip strength by at least 20 pounds for improved ability to open jars during meal prep tasks  Baseline: 27.3 lbs L hand 09/06/24: 44.7 lbs L hand Goal status: IN PROGRESS  3.  Pt will complete light meal prep task with no rest breaks  Baseline: New to OP OT, pt reports not attempting since CVA. Goal status: INITIAL  4.  Pt will demonstrate improved bilateral coordination by a score of no less than 30 seconds on each hand on 9 Hole Peg Test  Baseline: 9 Hole Peg test: Right: 44.26 sec; Left: 75.78 sec (pt was wearing artificial nails) 09/12/24: Left: 77 seconds, did drop 2 when taking them out and still was wearing artificial nails.  Goal status: INITIAL  5. Pt will demonstrate improved function in BUE by scoring at least 45 on Box and Blocks test  Baseline: Right 49 blocks, Left 24 blocks 09/12/24: 27 blocks, L hand Goal status: MODIFIED    ASSESSMENT:  CLINICAL IMPRESSION: Patient is a 28 y.o. female who was seen today for occupational therapy tx or L sided weakness s/p thromboembolic stroke. Hx includes MDD, panic disorder, HLD, microcytosis. Pt has improved L elbow strength and Box and Blocks of L hand, but did take longer to complete 9HPT on L hand. Noted improved grip strength this date. Pt at this time would benefit from continued skilled OT services to improve ability to complete ADL/IADL and return to PLOF as able.   PERFORMANCE DEFICITS: in functional skills including ADLs, IADLs, coordination, dexterity, strength, Fine motor control, Gross motor control, body mechanics, endurance, and UE functional use, cognitive skills including  attention and energy/drive, and psychosocial skills including coping strategies, environmental adaptation, and habits.   IMPAIRMENTS: are limiting patient from ADLs, IADLs, work, and leisure.   CO-MORBIDITIES: may have co-morbidities  that affects occupational performance. Patient will benefit from skilled OT to address above impairments and improve overall function.  MODIFICATION OR ASSISTANCE TO COMPLETE EVALUATION: Min-Moderate modification of tasks or assist with assess necessary to complete an evaluation.  OT OCCUPATIONAL PROFILE AND HISTORY: Detailed assessment: Review of records and additional review of physical, cognitive, psychosocial history related to current functional performance.  CLINICAL DECISION MAKING: Moderate - several treatment options, min-mod task modification necessary  REHAB POTENTIAL: Good  EVALUATION COMPLEXITY: Moderate   For all possible CPT codes, reference the Planned Interventions line above.     Check all conditions that are expected to impact treatment: {Conditions expected to impact treatment:Musculoskeletal disorders and Neurological condition and/or seizures   If treatment provided at initial evaluation, no treatment charged due to lack of authorization.      PLAN:  OT FREQUENCY: 1x/week  OT DURATION: 8 weeks  PLANNED INTERVENTIONS: 97168 OT Re-evaluation, 97535 self care/ADL training, 02889 therapeutic exercise, 97530 therapeutic activity, 97112 neuromuscular re-education, 97140 manual therapy, 97760 Orthotic Initial, 97763 Orthotic/Prosthetic subsequent, passive range of motion, functional mobility training, energy conservation, patient/family education, and DME and/or AE instructions  RECOMMENDED OTHER SERVICES: none, pt getting PT and SLP eval  CONSULTED AND AGREED WITH PLAN OF CARE: Patient  PLAN FOR NEXT SESSION: L hand strengthening and coordination tasks LUE theraband exercise as appropriate   Rocky Dutch, OT 09/12/2024, 4:34  PM

## 2024-09-13 ENCOUNTER — Telehealth: Payer: Self-pay

## 2024-09-13 ENCOUNTER — Telehealth: Payer: Self-pay | Admitting: Neurology

## 2024-09-13 ENCOUNTER — Inpatient Hospital Stay: Admitting: Neurology

## 2024-09-13 ENCOUNTER — Inpatient Hospital Stay: Payer: Self-pay | Admitting: Neurology

## 2024-09-13 NOTE — Telephone Encounter (Signed)
 Pt called to cancel  appt  due to transportation   Appt Canceled   Pt would like to reschedule How ever there were no available appt for this year . This is Hospital Follow up

## 2024-09-13 NOTE — Progress Notes (Unsigned)
 Complex Care Management Note Care Guide Note  09/13/2024 Name: Cassidy Bruce MRN: [id number] DOB: [birth year]   Complex Care Management Outreach Attempts: An unsuccessful telephone outreach was attempted today to offer the patient information about available complex care management services.  Follow Up Plan:  Additional outreach attempts will be made to offer the patient complex care management information and services.   Encounter Outcome:  No Answer-Left voicemail  Leotis Rase [facility], [facility] Guide  Direct Dial: [phone]  Fax [phone]

## 2024-09-13 NOTE — Telephone Encounter (Signed)
 Pt was called, she accepted the appointment for tomorrow, she will arrive at 1:00 for the 1:30 appointment.

## 2024-09-14 ENCOUNTER — Ambulatory Visit: Payer: Self-pay | Admitting: Neurology

## 2024-09-14 ENCOUNTER — Inpatient Hospital Stay: Admitting: Neurology

## 2024-09-14 ENCOUNTER — Encounter: Payer: Self-pay | Admitting: Neurology

## 2024-09-14 VITALS — BP 122/74 | HR 88 | Ht 64.0 in | Wt 141.8 lb

## 2024-09-14 DIAGNOSIS — I6502 Occlusion and stenosis of left vertebral artery: Secondary | ICD-10-CM

## 2024-09-14 DIAGNOSIS — I639 Cerebral infarction, unspecified: Secondary | ICD-10-CM

## 2024-09-14 DIAGNOSIS — Q2112 Patent foramen ovale: Secondary | ICD-10-CM | POA: Diagnosis not present

## 2024-09-14 DIAGNOSIS — E7841 Elevated Lipoprotein(a): Secondary | ICD-10-CM | POA: Diagnosis not present

## 2024-09-14 MED ORDER — REPATHA SURECLICK 140 MG/ML ~~LOC~~ SOAJ: 140.0000 mg | SUBCUTANEOUS | 2 refills | Status: DC

## 2024-09-14 NOTE — Progress Notes (Signed)
 [facility] [address] [place]. [place] [address] [phone]       OFFICE FOLLOW-UP NOTE  Ms. Rosina Collet Date of Birth:  [birth year] Medical Record Number:  [id number]   HPI: Cassidy Bruce is a pleasant 28 year old African-American lady seen today for initial office follow-up visit following hospital admission for stroke and September 2025.  History is obtained from the patient and review of electronic medical records.  I personally with pertinent available imaging films in PACS.  Patient has past medical history of ADHD, depression, adjustment disorder.  She was admitted on 07/05/2024 for evaluation for sudden onset of headache and room spinning while she was eating.  She felt dizzy and off balance and was unable to walk and had to hold on.  She had had a normal day and had gone to the gym earlier and then weightlifting without any specific neck pain.  Symptoms began the next day.  EMS was called and they brought her to the ER for evaluation.  CT scan on admission was concerning for right cerebellar infarct.  CT angiogram of the head and neck showed proximal left vertebral artery occlusion concerning for dissection.  CT perfusion showed bilateral cerebellar penumbra of 13 cc left more than right but no core infarct.  MRI scan of the brain was obtained which confirmed bilateral superior cerebellar artery infarcts as well as a right PICA territory infarct.  Echocardiogram showed normal ejection fraction.  Left atrial size was normal.  TEE/showed presence of a PFO.  TCD bubble study was suggestive of a grade 3 right-to-left shunt.  ANA was negative.  Urine drug screen was negative.  LDL cholesterol 104 mg percent.  Hemoglobin A1c was 5.4.  Hypercoagulable panel labs were all negative.  Lipoprotein a level was elevated at 162.7.  Patient was started on dual antiplatelet therapy aspirin  and Plavix .  Patient states she has done well since discharge.  Her speech is improved but she still  has trouble finding words and but it is improving.  She is undergoing physical occupational and speech therapy as an outpatient.  She can walk short distances independently but has to walk with a slow careful gait.  She uses a cane to walk long distances.  Her balance is mostly okay if she is careful and walking slowly.  She has had no falls or injuries.  Patient had to be dropped from fall semester at college.  Unable to return to work.  She was on hormonal implant which was removed during hospitalization.  Has any headache or any new complaints.  She is tolerating aspirin  and Plavix  without major bleeding and only occasional bruising.  She is tolerating Crestor  well without muscle aches and pains.  Her blood pressure is well-controlled and today it is 122/74.  She denies any prior history of DVT, pulm embolism, recurrent miscarriages.  There is no family history of strokes or heart attacks at a young age.  She does not smoke, use marijuana or do drugs. ROS:   14 system review of systems is positive for gait difficulty, incoordination, imbalance, speech difficulties, bruising all other systems negative  PMH:  Past Medical History:  Diagnosis Date   ADHD    Depression     Social History:  Social History   Socioeconomic History   Marital status: Single    Spouse name: Not on file   Number of children: Not on file   Years of education: Not on file   Highest education level: Not on file  Occupational History   Not on file  Tobacco Use   Smoking status: Never   Smokeless tobacco: Never  Vaping Use   Vaping status: Never Used  Substance and Sexual Activity   Alcohol use: No   Drug use: No   Sexual activity: Yes    Birth control/protection: Implant  Other Topics Concern   Not on file  Social History Narrative   Not on file   Social Drivers of Health   Financial Resource Strain: Low Risk  (05/09/2024)   [facility]   Overall Financial Resource Strain (CARDIA)     Difficulty of Paying Living Expenses: Not hard at all  Food Insecurity: No Food Insecurity (07/06/2024)   Hunger Vital Sign    Worried About Running Out of Food in the Last Year: Never true    Ran Out of Food in the Last Year: Never true  Transportation Needs: No Transportation Needs (07/06/2024)   PRAPARE - Administrator, Civil Service (Medical): No    Lack of Transportation (Non-Medical): No  Physical Activity: Not on file  Stress: Not on file  Social Connections: Unknown (07/09/2023)   [facility]   Social Network    Social Network: Not on file  Intimate Partner Violence: Not At Risk (07/06/2024)   Humiliation, Afraid, Rape, and Kick questionnaire    Fear of Current or Ex-Partner: No    Emotionally Abused: No    Physically Abused: No    Sexually Abused: No    Medications:   Current Outpatient Medications on File Prior to Visit  Medication Sig Dispense Refill   citalopram  (CELEXA ) 20 MG tablet Take 1 tablet (20 mg total) by mouth daily. 30 tablet 0   clopidogrel  (PLAVIX ) 75 MG tablet Take 1 tablet (75 mg total) by mouth daily. 30 tablet 0   ferrous sulfate  325 (65 FE) MG tablet Take 1 tablet (325 mg total) by mouth every other day. 30 tablet 0   oxyCODONE  (OXY IR/ROXICODONE ) 5 MG immediate release tablet Take 1 tablet (5 mg total) by mouth every 4 (four) hours as needed for breakthrough pain. 10 tablet 0   rosuvastatin  (CRESTOR ) 40 MG tablet Take 1 tablet (40 mg total) by mouth daily. 30 tablet 0   acetaminophen  (TYLENOL ) 500 MG tablet Take 500 mg by mouth every 6 (six) hours as needed.     aspirin  EC 81 MG tablet Take 1 tablet (81 mg total) by mouth daily. Swallow whole.     butalbital -acetaminophen -caffeine  (FIORICET ) 50-325-40 MG tablet Take 1 tablet by mouth every 8 (eight) hours as needed for headache (if not effective with tylenol ). 10 tablet 0   Vitamin D , Ergocalciferol , (DRISDOL ) 1.25 MG (50000 UNIT) CAPS capsule Take 1 capsule (50,000 Units total) by  mouth every 7 (seven) days. (Patient not taking: Reported on 09/14/2024) 5 capsule 0   No current facility-administered medications on file prior to visit.    Allergies:  No Known Allergies  Physical Exam General: well developed, well nourished pleasant young African-American lady, seated, in no evident distress Head: head normocephalic and atraumatic.  Neck: supple with no carotid or supraclavicular bruits Cardiovascular: regular rate and rhythm, no murmurs Musculoskeletal: no deformity Skin:  no rash/petichiae Vascular:  Normal pulses all extremities Vitals:   09/14/24 1340  BP: 122/74  Pulse: 88  SpO2: 99%   Neurologic Exam Mental Status: Awake and fully alert. Oriented to place and time. Recent and remote memory intact. Attention span, concentration and fund of  knowledge appropriate. Mood and affect appropriate.  Mild scanning dysarthria.  No aphasia Cranial Nerves: Fundoscopic exam reveals sharp disc margins. Pupils equal, briskly reactive to light. Extraocular movements full without nystagmus. Visual fields full to confrontation. Hearing intact. Facial sensation intact. Face, tongue, palate moves normally and symmetrically.  Motor: Normal bulk and tone. Normal strength in all tested extremity muscles. Sensory.: intact to touch ,pinprick .position and vibratory sensation.  Coordination: Impaired left finger-to-nose coordination.   Gait and Station: Arises from chair with slight difficulty.  Broad-based slightly ataxic gait uses a cane. Reflexes: 1+ and symmetric. Toes downgoing.   NIHSS  2 Modified Rankin  3   ASSESSMENT: 28 year old African-American lady with bilateral cerebellar infarcts in September 2025, left vertebral artery occlusion.  Etiology indeterminate as to vertebral artery dissection versus cryptogenic risk factors of hyperlipidemia, elevated lipoprotein, PFO and being on hormonal implant therapy which has since been discontinued.     PLAN:I had a long d/w  patient about her recent cerebellar strokes, left vertebral artery occlusion possible dissection, PFO, risk for recurrent stroke/TIAs, personally independently reviewed imaging studies and stroke evaluation results and answered questions.Continue aspirin  81 mg and Plavix  75 mg daily for 3 months and then aspirin  alone for secondary stroke prevention and maintain strict control of hypertension with blood pressure goal below 130/90, diabetes with hemoglobin A1c goal below 6.5% and lipids with LDL cholesterol goal below 70 mg/dL. I also advised the patient to eat a healthy diet with plenty of whole grains, cereals, fruits and vegetables, exercise regularly and maintain ideal body weight .I recommend adding Repatha 140 mg subcutaneous every 2 weeks for her elevated lipoprotein a.  Repeat CT angiogram of the brain and neck to look for interval recanalization of the left vertebral artery.  Patient stroke etiology for stroke is indeterminate as to cryptogenic versus vertebral artery dissection hence we will hold off on PFO closure at the present time.  Followup in the future with my nurse practitioner in 6 months or call earlier if necessary.  Greater than 50% of time during this visit was spent on counseling,explanation of diagnosis, planning of further management, discussion with patient and family and coordination of care Eather Popp, MD Note: This document was prepared with digital dictation and possible smart phrase technology. Any transcriptional errors that result from this process are unintentional

## 2024-09-14 NOTE — Progress Notes (Signed)
 Complex Care Management Note  Care Guide Note 09/14/2024 Name: Cassidy Bruce MRN: [id number] DOB: [birth year]  Cassidy Bruce is a 28 y.o. year old female who sees Pcp, No for primary care. I reached out to Rosina Collet by phone today to offer complex care management services.  [name] was given information about Complex Care Management services today including:   The Complex Care Management services include support from the care team which includes your Nurse Care Manager, Clinical Social Worker, or Pharmacist.  The Complex Care Management team is here to help remove barriers to the health concerns and goals most important to you. Complex Care Management services are voluntary, and the patient may decline or stop services at any time by request to their care team member.   Complex Care Management Consent Status: Patient agreed to services and verbal consent obtained.   Follow up plan:  Telephone appointment with complex care management team member scheduled for:  09/13/24 and 10/1224  12/12/Encounter Outcome:  Patient Scheduled  Leotis Rase [facility] Guide  Direct Dial: [phone]  Fax [phone]

## 2024-09-14 NOTE — Patient Instructions (Signed)
 I had a long d/w patient about her recent cerebellar strokes, left vertebral artery occlusion possible dissection, PFO, risk for recurrent stroke/TIAs, personally independently reviewed imaging studies and stroke evaluation results and answered questions.Continue aspirin  81 mg and Plavix  75 mg daily for 3 months and then aspirin  alone for secondary stroke prevention and maintain strict control of hypertension with blood pressure goal below 130/90, diabetes with hemoglobin A1c goal below 6.5% and lipids with LDL cholesterol goal below 70 mg/dL. I also advised the patient to eat a healthy diet with plenty of whole grains, cereals, fruits and vegetables, exercise regularly and maintain ideal body weight .I recommend adding Repatha 140 mg subcutaneous every 2 weeks for her elevated lipoprotein a.  Repeat CT angiogram of the brain and neck to look for interval recanalization of the left vertebral artery.  Patient stroke etiology for stroke is indeterminate as to cryptogenic versus vertebral artery dissection hence we will hold off on PFO closure at the present time.  Followup in the future with my nurse practitioner in 6 months or call earlier if necessary.  Stroke Prevention Some medical conditions and behaviors can lead to a higher chance of having a stroke. You can help prevent a stroke by eating healthy, exercising, not smoking, and managing any medical conditions you have. Stroke is a leading cause of functional impairment. Primary prevention is particularly important because a majority of strokes are first-time events. Stroke changes the lives of not only those who experience a stroke but also their family and other caregivers. How can this condition affect me? A stroke is a medical emergency and should be treated right away. A stroke can lead to brain damage and can sometimes be life-threatening. If a person gets medical treatment right away, there is a better chance of surviving and recovering from a  stroke. What can increase my risk? The following medical conditions may increase your risk of a stroke: Cardiovascular disease. High blood pressure (hypertension). Diabetes. High cholesterol. Sickle cell disease. Blood clotting disorders (hypercoagulable state). Obesity. Sleep disorders (obstructive sleep apnea). Other risk factors include: Being older than age 32. Having a history of blood clots, stroke, or mini-stroke (transient ischemic attack, TIA). Genetic factors, such as race, ethnicity, or a family history of stroke. Smoking cigarettes or using other tobacco products. Taking birth control pills, especially if you also use tobacco. Heavy use of alcohol or drugs, especially cocaine and methamphetamine. Physical inactivity. What actions can I take to prevent this? Manage your health conditions High cholesterol levels. Eating a healthy diet is important for preventing high cholesterol. If cholesterol cannot be managed through diet alone, you may need to take medicines. Take any prescribed medicines to control your cholesterol as told by your health care provider. Hypertension. To reduce your risk of stroke, try to keep your blood pressure below 130/80. Eating a healthy diet and exercising regularly are important for controlling blood pressure. If these steps are not enough to manage your blood pressure, you may need to take medicines. Take any prescribed medicines to control hypertension as told by your health care provider. Ask your health care provider if you should monitor your blood pressure at home. Have your blood pressure checked every year, even if your blood pressure is normal. Blood pressure increases with age and some medical conditions. Diabetes. Eating a healthy diet and exercising regularly are important parts of managing your blood sugar (glucose). If your blood sugar cannot be managed through diet and exercise, you may need to take medicines. Take  any prescribed  medicines to control your diabetes as told by your health care provider. Get evaluated for obstructive sleep apnea. Talk to your health care provider about getting a sleep evaluation if you snore a lot or have excessive sleepiness. Make sure that any other medical conditions you have, such as atrial fibrillation or atherosclerosis, are managed. Nutrition Follow instructions from your health care provider about what to eat or drink to help manage your health condition. These instructions may include: Reducing your daily calorie intake. Limiting how much salt (sodium) you use to 1,500 milligrams (mg) each day. Using only healthy fats for cooking, such as olive oil, canola oil, or sunflower oil. Eating healthy foods. You can do this by: Choosing foods that are high in fiber, such as whole grains, and fresh fruits and vegetables. Eating at least 5 servings of fruits and vegetables a day. Try to fill one-half of your plate with fruits and vegetables at each meal. Choosing lean protein foods, such as lean cuts of meat, poultry without skin, fish, tofu, beans, and nuts. Eating low-fat dairy products. Avoiding foods that are high in sodium. This can help lower blood pressure. Avoiding foods that have saturated fat, trans fat, and cholesterol. This can help prevent high cholesterol. Avoiding processed and prepared foods. Counting your daily carbohydrate intake.  Lifestyle If you drink alcohol: Limit how much you have to: 0-1 drink a day for women who are not pregnant. 0-2 drinks a day for men. Know how much alcohol is in your drink. In the [place] one drink equals one 12 oz bottle of beer ( ), one 5 oz glass of wine ( ), or one 1 oz glass of hard liquor (44mL). Do not use any products that contain nicotine or tobacco. These products include cigarettes, chewing tobacco, and vaping devices, such as e-cigarettes. If you need help quitting, ask your health care provider. Avoid secondhand  smoke. Do not use drugs. Activity  Try to stay at a healthy weight. Get at least 30 minutes of exercise on most days, such as: Fast walking. Biking. Swimming. Medicines Take over-the-counter and prescription medicines only as told by your health care provider. Aspirin  or blood thinners (antiplatelets or anticoagulants) may be recommended to reduce your risk of forming blood clots that can lead to stroke. Avoid taking birth control pills. Talk to your health care provider about the risks of taking birth control pills if: You are over 42 years old. You smoke. You get very bad headaches. You have had a blood clot. Where to find more information American Stroke Association: www.strokeassociation.org Get help right away if: You or a loved one has any symptoms of a stroke. BE FAST is an easy way to remember the main warning signs of a stroke: B - Balance. Signs are dizziness, sudden trouble walking, or loss of balance. E - Eyes. Signs are trouble seeing or a sudden change in vision. F - Face. Signs are sudden weakness or numbness of the face, or the face or eyelid drooping on one side. A - Arms. Signs are weakness or numbness in an arm. This happens suddenly and usually on one side of the body. S - Speech. Signs are sudden trouble speaking, slurred speech, or trouble understanding what people say. T - Time. Time to call emergency services. Write down what time symptoms started. You or a loved one has other signs of a stroke, such as: A sudden, severe headache with no known cause. Nausea or vomiting. Seizure. These symptoms may represent a  serious problem that is an emergency. Do not wait to see if the symptoms will go away. Get medical help right away. Call your local emergency services (911 in the [place] Do not drive yourself to the hospital. Summary You can help to prevent a stroke by eating healthy, exercising, not smoking, limiting alcohol intake, and managing any medical conditions  you may have. Do not use any products that contain nicotine or tobacco. These include cigarettes, chewing tobacco, and vaping devices, such as e-cigarettes. If you need help quitting, ask your health care provider. Remember BE FAST for warning signs of a stroke. Get help right away if you or a loved one has any of these signs. This information is not intended to replace advice given to you by your health care provider. Make sure you discuss any questions you have with your health care provider. Document Revised: 09/22/2022 Document Reviewed: 09/22/2022 Elsevier Patient Education  2024 [employer].

## 2024-09-15 ENCOUNTER — Ambulatory Visit: Payer: Self-pay | Admitting: Internal Medicine

## 2024-09-19 ENCOUNTER — Telehealth: Payer: Self-pay | Admitting: Neurology

## 2024-09-19 ENCOUNTER — Ambulatory Visit: Payer: Self-pay | Admitting: Family

## 2024-09-19 NOTE — Telephone Encounter (Signed)
 Cassidy Bruce(resolution specialist w/ [facility] [phone]) on behalf of [facility] [phone] states that a PA is needed for Evolocumab (REPATHA SURECLICK) 140 MG/ML SOAJ

## 2024-09-20 ENCOUNTER — Telehealth: Payer: Self-pay

## 2024-09-20 ENCOUNTER — Other Ambulatory Visit (HOSPITAL_COMMUNITY): Payer: Self-pay

## 2024-09-20 ENCOUNTER — Encounter: Payer: Self-pay | Admitting: Physical Medicine & Rehabilitation

## 2024-09-20 ENCOUNTER — Encounter: Attending: Physical Medicine & Rehabilitation | Admitting: Physical Medicine & Rehabilitation

## 2024-09-20 VITALS — BP 113/75 | HR 75 | Ht 64.0 in | Wt 141.4 lb

## 2024-09-20 DIAGNOSIS — Q2112 Patent foramen ovale: Secondary | ICD-10-CM | POA: Insufficient documentation

## 2024-09-20 DIAGNOSIS — I639 Cerebral infarction, unspecified: Secondary | ICD-10-CM | POA: Insufficient documentation

## 2024-09-20 NOTE — Telephone Encounter (Signed)
 Pharmacy Patient Advocate Encounter   Received notification from Physician's Office that prior authorization for Repatha 140mg /ml autoinjecter is required/requested.   Insurance verification completed.   The patient is insured through [facility] MEDICAID.   Per test claim: PA required; PA submitted to above mentioned insurance via Latent Key/confirmation #/EOC [id number] Status is pending

## 2024-09-20 NOTE — Telephone Encounter (Signed)
 Pharmacy Patient Advocate Encounter  Received notification from [facility] MEDICAID that Prior Authorization for Repatha has been DENIED.  Full denial letter will be uploaded to the media tab. See denial reason below.   PA #/Case ID/Reference #: [id number]

## 2024-09-20 NOTE — Progress Notes (Signed)
 Subjective:    Patient ID: Cassidy Bruce, female    DOB: 08-Jun-1996, 28 y.o.   MRN: [id number] CIR admission for stroke rehabilitation Admit date: 07/13/2024 Discharge date: 08/03/2024  HPI  Pain In27 y.o. right-handed female with history significant for ADHD/depression.  No prescription medications other than birth control.  Per chart review lives with a friend.  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  They live in a second floor apartment.  Plan to discharge home with friend and 2 aunts with good support.  Family is checking on ground availability apartments.  Presented 07/05/2024 with dizziness slurred speech headache and gait abnormality with left-sided weakness.  Per family she had went to the gym on Monday, 07/04/2024 for weightlifting as usual no specific problems.  Tuesday morning she awoke from sleep with headache and neck pain and by the afternoon patient with increasing dizziness vertigo as well as slurred speech.  Cranial CT scan showed no acute intracranial abnormalities new right cerebellar infarct since prior study of 2021 felt to be possibly chronic.  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  Distal V2 segment reconstitution but with attenuated enhancement relative to the contralateral side along the remainder of its course.  A 13 mm region of ischemia affecting both cerebral hemispheres worse on the left with no more core infarct.  MRI identified acute infarcts within the cerebellar vermis and bilateral cerebellar hemispheres.  Most notably large acute infarct present within the superior cerebellar artery territories bilaterally.  Posterior fossa mass effect without cerebellar tonsillar herniation or evidence of obstructive hydrocephalus.  Petechial hemorrhage within the cerebellar vermis and superior right cerebellar hemisphere.  Patient did not receive TNK.  No thrombectomy needed for left VA occlusion as BA was patent.  Admission chemistries  unremarkable except potassium 3.2 ALT 58 CK-284 urine drug screen negative.  Echocardiogram with ejection fraction of 60 to 65% no regional wall motion abnormalities.  EEG negative for seizure.  TEE completed bubble study with small PFO at rest LTR shunting and with Valsalva RTL shunting and plan for follow-up outpatient with cardiology services.  Venous Doppler studies negative for DVT.  Patient placed on heparin  drip 9/8 x 48 hours transition to aspirin  81 mg daily and Plavix  75 mg day x 3 months then aspirin  alone.  Psychiatry consulted 07/13/2024 for history of major depressive disorder.  Therapy evaluations completed due to patient decreased functional mobility left-sided weakness was admitted for a comprehensive rehab program. Inventory  Discussed the use of AI scribe software for clinical note transcription with the patient, who gave verbal consent to proceed.  History of Present Illness Cassidy Bruce is a 28 year old female with a history of stroke who presents for follow-up regarding her rehabilitation progress.  She is currently undergoing rehabilitation following a stroke that occurred on September 2nd. She resides in [place] with her parents but travels to [place] for therapy sessions. She plans to return to [facility] after recovery and prefers to maintain her current healthcare providers.  She has been prescribed aspirin  and Plavix  for three months post-stroke, with a plan to continue aspirin  thereafter. She has not yet started Repatha due to insurance approval delays. A CT angiogram was performed on December 1st, and she wore a heart monitor for 14 days; she received a message that everything looked good and that her doctor would discuss the results with her at her next appointment.  She is able to perform daily activities such as dressing, bathing, and  cooking using both hands. No issues with sensation or temperature discrimination on either side of her body. She uses a cane for long  distances but not for short walks around the house.  She was previously working and studying but had to pause due to her stroke. 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 She is currently not driving but is open to resuming once she feels more confident and has practiced.  Her therapy is scheduled to continue until the end of December, with sessions once a week due to [facility] coverage limitations. She is considering returning to school and work after completing her therapy sessions.   Average Pain 0 Pain Right Now 0 My pain is na  In the last 24 hours, has pain interfered with the following? General activity 0 Relation with others 0 Enjoyment of life 0 What TIME of day is your pain at its worst?na Sleep (in general) NA  Pain is worse with: na Pain improves with: na Relief from Meds: na  Family History  Problem Relation Age of Onset   Cancer Other    Diabetes Other    CAD Other    Healthy Mother    Social History   Socioeconomic History   Marital status: Single    Spouse name: Not on file   Number of children: Not on file   Years of education: Not on file   Highest education level: Not on file  Occupational History   Not on file  Tobacco Use   Smoking status: Never   Smokeless tobacco: Never  Vaping Use   Vaping status: Never Used  Substance and Sexual Activity   Alcohol use: No   Drug use: No   Sexual activity: Yes    Birth control/protection: Implant  Other Topics Concern   Not on file  Social History Narrative   Not on file   Social Drivers of Health   Financial Resource Strain: Low Risk  (05/09/2024)   Received from [facility]   Overall Financial Resource Strain (CARDIA)    Difficulty of Paying Living Expenses: Not hard at all  Food Insecurity: No Food Insecurity (07/06/2024)   Hunger Vital Sign    Worried About Running Out of Food in the Last Year: Never true    Ran Out of Food in the Last  Year: Never true  Transportation Needs: No Transportation Needs (07/06/2024)   PRAPARE - Administrator, Civil Service (Medical): No    Lack of Transportation (Non-Medical): No  Physical Activity: Not on file  Stress: Not on file  Social Connections: Not on file   Past Surgical History:  Procedure Laterality Date   TRANSESOPHAGEAL ECHOCARDIOGRAM (CATH LAB) N/A 07/07/2024   Procedure: TRANSESOPHAGEAL ECHOCARDIOGRAM;  Surgeon: Lonni Slain, MD;  [facility]AS[facility] CV LAB;  Service: Cardiovascular;  Laterality: N/A;   Past Surgical History:  Procedure Laterality Date   TRANSESOPHAGEAL ECHOCARDIOGRAM (CATH LAB) N/A 07/07/2024   Procedure: TRANSESOPHAGEAL ECHOCARDIOGRAM;  Surgeon: Lonni Slain, MD;  Location: [facility] INVASIVE CV LAB;  Service: Cardiovascular;  Laterality: N/A;   Past Medical History:  Diagnosis Date   ADHD    Depression    BP 113/75   Pulse 75   Ht 5' 4 (1.626 m)   Wt 141 lb 6.4 oz (64.1 kg)   SpO2 96%   BMI 24.27 kg/m   Opioid Risk Score:   Fall Risk Score:  `1  Depression screen [facility] 2/9  No data to display           Review of Systems  Constitutional: Negative.   Musculoskeletal:  Positive for gait problem.       Shoulder /arm  All other systems reviewed and are negative.      Objective:   Physical Exam General No acute distress Mood and affect appropriate Extremities without edema Speech with mild ataxic dysarthria Positive dysdiadochokinesis with rapid alternating supination pronation left upper extremity Finger thumb opposition is mildly slowed on the left side compared to right side Motor strength is 4/5 in left deltoid, bicep, tricep, grip 5/5 in the right deltoid, bicep, tricep, grip Sensation is reported as equal bilaterally in lower and upper extremities Ambulates without assistive device no evidence of toe drag or knee instability she does have a stifflegged gait on the left side. She does have some  gluteus medius lurch on the left as well. Negative Romberg       Assessment & Plan:  Assessment and Plan Assessment & Plan Functional deficits secondary to bilateral cerebellar infarcts Improvement in functional abilities with residual left-sided weakness. Gluteal weakness contributes to hip shift during ambulation. - Continue physical therapy through end of December. - Perform gluteal strengthening exercises: three sets of ten repetitions on each side, focusing on the left. - Reassess functional abilities in one month.  Gluteal muscle weakness Weakness contributes to hip shift during ambulation. - Perform gluteal strengthening exercises: three sets of ten repetitions on each side, focusing on the left.  Neuropsychological and cognitive deficits post-stroke No specific discussion of neuropsychological or cognitive deficits in this encounter.  Antithrombotic management post-stroke Continued management with aspirin  and Plavix  post-second stroke. Transition to aspirin  monotherapy planned. - Continue aspirin  and Plavix  through December 2nd, 2025. - Transition to aspirin  monotherapy after December 2nd, 2025.  Hyperlipidemia with elevated lipoprotein a Repatha injection recommended. Delay due to insurance approval process. - Follow up with pharmacy to confirm insurance coverage and copay for Repatha. - Start Repatha injection once insurance approval is obtained.  This is being prescribed by neurology

## 2024-09-21 ENCOUNTER — Ambulatory Visit (INDEPENDENT_AMBULATORY_CARE_PROVIDER_SITE_OTHER): Payer: Self-pay | Admitting: Family

## 2024-09-21 ENCOUNTER — Encounter: Payer: Self-pay | Admitting: Family

## 2024-09-21 VITALS — BP 127/81 | HR 74 | Temp 99.7°F | Resp 16

## 2024-09-21 DIAGNOSIS — I998 Other disorder of circulatory system: Secondary | ICD-10-CM | POA: Diagnosis not present

## 2024-09-21 DIAGNOSIS — F331 Major depressive disorder, recurrent, moderate: Secondary | ICD-10-CM

## 2024-09-21 DIAGNOSIS — Z8673 Personal history of transient ischemic attack (TIA), and cerebral infarction without residual deficits: Secondary | ICD-10-CM | POA: Diagnosis not present

## 2024-09-21 DIAGNOSIS — I639 Cerebral infarction, unspecified: Secondary | ICD-10-CM

## 2024-09-21 DIAGNOSIS — F909 Attention-deficit hyperactivity disorder, unspecified type: Secondary | ICD-10-CM

## 2024-09-21 DIAGNOSIS — F4001 Agoraphobia with panic disorder: Secondary | ICD-10-CM

## 2024-09-21 DIAGNOSIS — Z09 Encounter for follow-up examination after completed treatment for conditions other than malignant neoplasm: Secondary | ICD-10-CM

## 2024-09-21 DIAGNOSIS — E785 Hyperlipidemia, unspecified: Secondary | ICD-10-CM

## 2024-09-21 DIAGNOSIS — R718 Other abnormality of red blood cells: Secondary | ICD-10-CM

## 2024-09-21 DIAGNOSIS — Q2112 Patent foramen ovale: Secondary | ICD-10-CM

## 2024-09-21 DIAGNOSIS — M549 Dorsalgia, unspecified: Secondary | ICD-10-CM

## 2024-09-21 DIAGNOSIS — F332 Major depressive disorder, recurrent severe without psychotic features: Secondary | ICD-10-CM

## 2024-09-21 DIAGNOSIS — Z7689 Persons encountering health services in other specified circumstances: Secondary | ICD-10-CM

## 2024-09-21 NOTE — Progress Notes (Signed)
 Patient is on anti depresses and she feel like they are not working, hospital follow up, patient is having back pain

## 2024-09-21 NOTE — Telephone Encounter (Signed)
 [facility] called to informed That Pt PA was denied  , and to see if MD wanted to file an  [employer] [phone]  Rx reference - [phone]

## 2024-09-21 NOTE — Progress Notes (Signed)
 Subjective:    Cassidy Bruce - 28 y.o. female MRN [id number]  Date of birth: 09-21-1996  HPI  [name] is to establish care and hospital discharge follow-up.   Current issues and/or concerns: Patient seen on 07/13/2024 - 08/03/2024 (21 days) at [facility] for thromboembolic stroke and additional diagnoses. Today she denies red flag symptoms. She is doing well on medication regimen, no issues/concerns. Established with Neurology, Cardiology, Physical Medicine, Physical Therapy, and Occupational Therapy. States she doesn't feel anxiety depression medications helping as much as she would like. Requests referral to therapist. She denies thoughts of self-harm, suicidal ideations, homicidal ideations. Back pain during nighttime. Denies recent trauma/injury and red flag symptoms.   ROS per HPI    Health Maintenance:  Health Maintenance Due  Topic Date Due   Hepatitis C Screening  Never done   Hepatitis B Vaccines 19-59 Average Risk (1 of 3 - 19+ 3-dose series) Never done   HPV VACCINES (1 - 3-dose SCDM series) Never done   Influenza Vaccine  06/03/2024   COVID-19 Vaccine (3 - 2025-26 season) 07/04/2024     Past Medical History: Patient Active Problem List   Diagnosis Date Noted   Severe episode of recurrent major depressive disorder, without psychotic features (HCC) 07/15/2024   Panic disorder with agoraphobia and moderate panic attacks 07/15/2024   Thromboembolic stroke (HCC) 07/13/2024   PFO (patent foramen ovale) 07/07/2024   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   Hypokalemia 07/06/2024   Stroke (HCC) 07/05/2024   MDD (major depressive disorder), recurrent episode, moderate (HCC) 07/25/2018   Adjustment disorder with mixed anxiety and depressed mood       Social History   reports that she has never smoked. She has never used smokeless tobacco. She reports that she does not drink alcohol and does not use drugs.   Family History  family history includes CAD  in an other family member; Cancer in an other family member; Diabetes in an other family member; Healthy in her mother.   Medications: reviewed and updated   Objective:   Physical Exam BP 127/81   Pulse 74   Temp 99.7 F (37.6 C) (Oral)   Resp 16   LMP 08/04/2024 (Exact Date)   SpO2 98%   Physical Exam HENT:     Head: Normocephalic and atraumatic.     Nose: Nose normal.     Mouth/Throat:     Mouth: Mucous membranes are moist.     Pharynx: Oropharynx is clear.  Eyes:     Extraocular Movements: Extraocular movements intact.     Conjunctiva/sclera: Conjunctivae normal.     Pupils: Pupils are equal, round, and reactive to light.  Cardiovascular:     Rate and Rhythm: Normal rate and regular rhythm.     Pulses: Normal pulses.     Heart sounds: Normal heart sounds.  Pulmonary:     Effort: Pulmonary effort is normal.     Breath sounds: Normal breath sounds.  Musculoskeletal:        General: Normal range of motion.     Cervical back: Normal, normal range of motion and neck supple.     Thoracic back: Normal.     Lumbar back: Normal.  Neurological:     General: No focal deficit present.     Mental Status: She is alert and oriented to person, place, and time.  Psychiatric:        Mood and Affect: Mood normal.        Behavior: Behavior normal.  Assessment & Plan:  1. Encounter to establish care (Primary) - Patient presents today to establish care. During the interim follow-up with primary provider as scheduled.  - Return for annual physical examination, labs, and health maintenance. Arrive fasting meaning having no food for at least 8 hours prior to appointment. You may have only water  or black coffee. Please take scheduled medications as normal.  2. Hospital discharge follow-up - Reviewed hospital course, current medications, ensured proper follow-up in place, and addressed concerns.   3. Thromboembolic stroke (HCC) 4. PFO (patent foramen ovale) 5. Vascular occlusion 6.  Hyperlipidemia, unspecified hyperlipidemia type - Continue present management. - Keep all scheduled appointments with established Neurology, Cardiology, Physical Medicine, Physical Therapy, and Occupational Therapy.   7. MDD (major depressive disorder), recurrent episode, moderate (HCC) 8. Severe episode of recurrent major depressive disorder, without psychotic features (HCC) 9. Panic disorder with agoraphobia and moderate panic attacks 10. Attention deficit hyperactivity disorder (ADHD), unspecified ADHD type - Patient denies thoughts of self-harm, suicidal ideations, homicidal ideations. - Continue present management.  - Referral to Psychiatry for evaluation/management.  - Follow-up with primary provider as scheduled.  - Ambulatory referral to Psychiatry  11. Microcytosis - Continue present management.  - Follow-up with primary provider as scheduled.   12. Back pain, unspecified back location, unspecified back pain laterality, unspecified chronicity - Referral to Orthopedic Surgery for evaluation/management.  - Follow-up with primary provider as scheduled. - Ambulatory referral to Orthopedic Surgery    Patient was given clear instructions to go to Emergency Department or return to medical center if symptoms don't improve, worsen, or new problems develop.The patient verbalized understanding.  I discussed the assessment and treatment plan with the patient. The patient was provided an opportunity to ask questions and all were answered. The patient agreed with the plan and demonstrated an understanding of the instructions.   The patient was advised to call back or seek an in-person evaluation if the symptoms worsen or if the condition fails to improve as anticipated.    Greig Drones, NP 09/21/2024, 3:36 PM Primary Care at [facility]

## 2024-09-22 ENCOUNTER — Ambulatory Visit: Admitting: Physical Therapy

## 2024-09-22 ENCOUNTER — Encounter: Payer: Self-pay | Admitting: Physical Therapy

## 2024-09-22 DIAGNOSIS — M6281 Muscle weakness (generalized): Secondary | ICD-10-CM

## 2024-09-22 DIAGNOSIS — R29898 Other symptoms and signs involving the musculoskeletal system: Secondary | ICD-10-CM

## 2024-09-22 DIAGNOSIS — R2681 Unsteadiness on feet: Secondary | ICD-10-CM

## 2024-09-22 DIAGNOSIS — R208 Other disturbances of skin sensation: Secondary | ICD-10-CM

## 2024-09-22 DIAGNOSIS — R278 Other lack of coordination: Secondary | ICD-10-CM

## 2024-09-22 DIAGNOSIS — I69354 Hemiplegia and hemiparesis following cerebral infarction affecting left non-dominant side: Secondary | ICD-10-CM

## 2024-09-22 NOTE — Therapy (Signed)
 OUTPATIENT PHYSICAL THERAPY NEURO TREATMENT   Patient Name: Cassidy Bruce MRN: [id number] DOB:1995-11-19, 28 y.o., female Today's Date: 09/22/2024   PCP: Greig JINNY Drones, NP REFERRING PROVIDER: Carilyn Prentice BRAVO, MD  END OF SESSION:  PT End of Session - 09/22/24 1108     Visit Number 4    Number of Visits 9   8 + eval   Date for Recertification  10/28/24   pushed out due to multi-D scheduling and aquatic needs   Authorization Type [place] [employer]    PT Start Time 1101    PT Stop Time 1145    PT Time Calculation (min) 44 min    Equipment Utilized During Treatment Other (comment)   aquatic devices as needed for safety and challenge   Activity Tolerance Patient tolerated treatment well    Behavior During Therapy [facility] for tasks assessed/performed          Past Medical History:  Diagnosis Date   ADHD    Depression    Stroke (HCC) 07/05/2024   Past Surgical History:  Procedure Laterality Date   TRANSESOPHAGEAL ECHOCARDIOGRAM (CATH LAB) N/A 07/07/2024   Procedure: TRANSESOPHAGEAL ECHOCARDIOGRAM;  Surgeon: Lonni Slain, MD;  Location: [facility] INVASIVE CV LAB;  Service: Cardiovascular;  Laterality: N/A;   Patient Active Problem List   Diagnosis Date Noted   Severe episode of recurrent major depressive disorder, without psychotic features (HCC) 07/15/2024   Panic disorder with agoraphobia and moderate panic attacks 07/15/2024   Thromboembolic stroke (HCC) 07/13/2024   PFO (patent foramen ovale) 07/07/2024   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   Hypokalemia 07/06/2024   Stroke (HCC) 07/05/2024   MDD (major depressive disorder), recurrent episode, moderate (HCC) 07/25/2018   Adjustment disorder with mixed anxiety and depressed mood     ONSET DATE: 07/05/2024 (CVA)  REFERRING DIAG: I63.9 (ICD-10-CM) - Cerebral infarction, unspecified  THERAPY DIAG:  Other lack of coordination  Muscle weakness (generalized)  Other disturbances of skin sensation  Other symptoms and  signs involving the musculoskeletal system  Hemiplegia and hemiparesis following cerebral infarction affecting left non-dominant side (HCC)  Unsteadiness on feet  Rationale for Evaluation and Treatment: Rehabilitation  SUBJECTIVE:                                                                                                                                                                                             SUBJECTIVE STATEMENT: Pt presents ambulatory modified independently, using cane today, to [facility] facility for aquatics.  No pain and no falls.   Pt accompanied by: family member (aunt in lobby - pt does not drive herself)  PERTINENT HISTORY: MDD, CVA,  PFO, panic disorder w/ agoraphobia and moderate panic attacks  Per inpt rehab discharge summary: Presented 07/05/2024 with dizziness slurred speech headache and gait abnormality with left-sided weakness. Per family she had went to the gym on Monday, 07/04/2024 for weightlifting as usual no specific problems. Tuesday morning she awoke from sleep with headache and neck pain and by the afternoon patient with increasing dizziness vertigo as well as slurred speech. Cranial CT scan showed no acute intracranial abnormalities new right cerebellar infarct since prior study of 2021 felt to be possibly chronic. 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 Distal V2 segment reconstitution but with attenuated enhancement relative to the contralateral side along the remainder of its course. A 13 mm region of ischemia affecting both cerebral hemispheres worse on the left with no more core infarct. MRI identified acute infarcts within the cerebellar vermis and bilateral cerebellar hemispheres. Most notably large acute infarct present within the superior cerebellar artery territories bilaterally. Posterior fossa mass effect without cerebellar tonsillar herniation or evidence of obstructive hydrocephalus. Petechial hemorrhage within the cerebellar vermis  and superior right cerebellar hemisphere. Patient did not receive TNK. No thrombectomy needed for left VA occlusion as BA was patent.  PAIN:  Are you having pain? No  PRECAUTIONS: Fall and Other: Zio heart monitor  RED FLAGS: None   WEIGHT BEARING RESTRICTIONS: No  FALLS: Has patient fallen in last 6 months? No and day of CVA she slid to the floor to lay down  LIVING ENVIRONMENT: Lives with: lives alone Lives in: Transitional housing - hotel Stairs: No - mostly uses elevator but will walk down the stairs Has following equipment at home: shower chair and elevator and triangle rollator  PLOF: Independent - she has been trying to navigate crowds (went to [employer] and [employer])  PATIENT GOALS: my balance  OBJECTIVE:  Note: Objective measures were completed at Evaluation unless otherwise noted.  DIAGNOSTIC FINDINGS:  Brain MRI 07/11/2024: IMPRESSION: 1. Evolving early subacute bilateral cerebellar infarcts, left slightly worse than right. Possible mild interval expansion versus changes of Wallerian degeneration (favored) involving the midbrain since previous. Associated petechial hemorrhage without frank hemorrhagic transformation. 2. Loss of normal flow void within the left vertebral artery, consistent with previously identified dissection. 3. Otherwise stable and normal brain MRI.  COGNITION: Overall cognitive status: Impaired and delayed processing and speech impairments/slurred speech   SENSATION: Light touch: WFL  COORDINATION: BLE RAMS:  LLE fatigues and slows with prolonged task Heel-to-shin:  mildly dysmetric LLE  EDEMA:  None noted in BLE  MUSCLE TONE: None noted in LLE  POSTURE: forward head - very mild  LOWER EXTREMITY ROM:     Active  Right Eval Left Eval  Hip flexion Grossly WNL  Hip extension   Hip abduction   Hip adduction   Hip internal rotation   Hip external rotation   Knee flexion   Knee extension   Ankle dorsiflexion   Ankle  plantarflexion    Ankle inversion    Ankle eversion     (Blank rows = not tested)  LOWER EXTREMITY MMT:    MMT Right Eval Left Eval  Hip flexion 5 4+  Hip extension    Hip abduction 4+ 4  Hip adduction    Hip internal rotation    Hip external rotation    Knee flexion 5 4+  Knee extension 5 5  Ankle dorsiflexion 5 5  Ankle plantarflexion    Ankle inversion    Ankle eversion    (Blank rows =  not tested)  BED MOBILITY:  Findings: Sit to supine Complete Independence Supine to sit Complete Independence Rolling to Right Complete Independence Rolling to Left Complete Independence  TRANSFERS: Sit to stand: Complete Independence  Assistive device utilized: None     Stand to sit: Complete Independence  Assistive device utilized: None     Chair to chair: SBA  Assistive device utilized: None       RAMP:  Not tested  CURB:  Not tested  STAIRS: Not tested GAIT: Findings: Gait Characteristics: step through pattern, decreased arm swing- Left, decreased stride length, and genu recurvatum- Left, Distance walked: various clinic distances, Assistive device utilized:None, Level of assistance: SBA and CGA, and Comments: mild lateral left knee thrust in stance  FUNCTIONAL TESTS:  5 times sit to stand: 14.54 sec no UE support 10 meter walk test: 12.94 sec no AD CGA = 0.77 m/sec OR 2.55 ft/sec Functional gait assessment:  FUNCTIONAL GAIT ASSESSMENT  Date: 08/19/2024 Score  GAIT LEVEL SURFACE Instructions: Walk at your normal speed from here to the next mark (6 m) [20 ft]. (2) Mild impairment - Walks 6 m (20 ft) in less than 7 seconds but greater than 5.5 seconds, uses assistive device, slower speed, mild gait deviations, or deviates 15.24 -25.4 cm (6 -10 in) outside of the 30.48-cm (12-in) walkway width.  2.   CHANGE IN GAIT SPEED Instructions: Begin walking at your normal pace (for 1.5 m [5 ft]). When I tell you "go," walk as fast as you can (for 1.5 m [5 ft]). When I tell you "slow,"  walk as slowly as you can (for 1.5 m [5 ft]. (3) Normal - Able to smoothly change walking speed without loss of balance or gait deviation. Shows a significant difference in walking speeds between normal, fast, and slow speeds. Deviates no more than 15.24 cm (6 in) outside of the 30.48-cm (12-in) walkway width.  3.    GAIT WITH HORIZONTAL HEAD TURNS Instructions: Walk from here to the next mark 6 m (20 ft) away. Begin walking at your normal pace. Keep walking straight; after 3 steps, turn your head to the right and keep walking straight while looking to the right. After 3 more steps, turn your head to the left and keep walking straight while looking left. Continue alternating looking right and left. (2) Mild impairment - Performs head turns smoothly with slight change in gait velocity (eg, minor disruption to smooth gait path), deviates 15.24 -25.4 cm (6 -10 in) outside 30.48-cm (12-in) walkway width, or uses an assistive device.  4.   GAIT WITH VERTICAL HEAD TURNS Instructions: Walk from here to the next mark (6 m [20 ft]). Begin walking at your normal pace. Keep walking straight; after 3 steps, tip your head up and keep walking straight while looking up. After 3 more steps, tip your head down, keep walking straight while looking down. Continue  alternating looking up and down every 3 steps until you have completed 2 repetitions in each direction. (3) Normal - Performs head turns with no change in gait. Deviates no more than 15.24 cm (6 in) outside 30.48-cm (12-in) walkway width.  5.  GAIT AND PIVOT TURN Instructions: Begin with walking at your normal pace. When I tell you, "turn and stop," turn as quickly as you can to face the opposite direction and stop. (1) Moderate impairment - Turns slowly, requires verbal cueing, or requires several small steps to catch balance following turn and stop  6.   STEP OVER OBSTACLE  Instructions: Begin walking at your normal speed. When you come to the shoe box, step over  it, not around it, and keep walking. (1) Moderate impairment - Is able to step over one shoe box (11.43 cm [4.5 in] total height) but must slow down and adjust steps to clear box safely. May require verbal cueing.  7.   GAIT WITH NARROW BASE OF SUPPORT Instructions: Walk on the floor with arms folded across the chest, feet aligned heel to toe in tandem for a distance of 3.6 m [12 ft]. The number of steps taken in a straight line are counted for a maximum of 10 steps. (2) Mild impairment - Ambulates 7-9 steps  8.   GAIT WITH EYES CLOSED Instructions: Walk at your normal speed from here to the next mark (6 m [20 ft]) with your eyes closed. (2) Mild impairment - Walks 6 m (20 ft), uses assistive device, slower speed, mild gait deviations, deviates 15.24 -25.4 cm (6 -10 in) outside 30.48-cm (12-in) walkway width. Ambulates 6 m (20 ft) in less than 9 seconds but greater than 7 seconds  9.   AMBULATING BACKWARDS Instructions: Walk backwards until I tell you to stop (2) Mild impairment - Walks 6 m (20 ft), uses assistive device, slower speed, mild gait deviations, deviates 15.24 -25.4 cm (6 -10 in) outside 30.48-cm (12-in) walkway width  10. STEPS Instructions: Walk up these stairs as you would at home (ie, using the rail if necessary). At the top turn around and walk down. (3) Normal-Alternating feet, no rail.  Total 21/30   Interpretation of scores: Non-Specific Older Adults Cutoff Score: <=22/30 = risk of falls Parkinson's Disease Cutoff score <15/30= fall risk (Hoehn & Yahr 1-4)  Minimally Clinically Important Difference (MCID)  Stroke (acute, subacute, and chronic) = MDC: 4.2 points Vestibular (acute) = MDC: 6 points Community Dwelling Older Adults =  MCID: 4 points Parkinson's Disease  =  MDC: 4.3 points  (Academy of Neurologic Physical Therapy (nd). Functional Gait Assessment. Retrieved from  https://www.neuropt.org/docs/default-source/cpgs/core-outcome-measures/function-gait-assessment-pocket-guide-proof9-(2).pdf?sfvrsn=b28f35043_0.)  PATIENT SURVEYS:  None relevant to chief complaint and age range.                                                                                                                              TREATMENT DATE: 09/22/2024  Aquatic therapy at [facility] - pool temperature 92 degrees   Patient seen for aquatic therapy today.  Treatment took place in water  3.6-4.8 feet deep depending upon activity.  Patient entered and exited the pool via stairs reciprocally using bil rails at mod I level.   Exercises: -Water  walking warmup 4x18 ft unsupported forward > backward > laterally; used kickboard for resistance forward and backward and added chest press to lateral stepping, some instability and stepping strategy noted  -Forward T at bench in shallow water  x10 > 2x10 w/ pool noodle (used against wall intermittently for LUE stability) -Falling star w/ large noodle 2x10 each side -Tree pose w/  noodle support 5x20-30 seconds each LE; time spent repeating task working into arms overhead for varying times  Patient requires buoyancy of the water  for support for reduced fall risk with gait training and balance exercises with SBA support, min return demo and verbal cues for form. Exercises able to be performed safely in water  without the risk of fall compared to those same exercises performed on land; viscosity of water  needed for resistance for strengthening. Current of water  provides perturbations for challenging static and dynamic balance.    PATIENT EDUCATION: Education details: Continue HEP.  Reminders of next OT/PT appts.  Discussed STG assessment next visit and plan to further discuss likely re-cert on 87/1 to wrap up or continue aquatic sessions. Person educated: Patient Education method: Explanation, Demonstration, Verbal cues, and Handouts Education  comprehension: verbalized understanding and needs further education  HOME EXERCISE PROGRAM: Access Code: [email or web address] URL: [email or web address] Date: 08/19/2024 Prepared by: Daved Bull  Exercises - Seated Hamstring Curls with Resistance  - 1 x daily - 7 x weekly - 2 sets - 10 reps - Standing Hamstring Curl with Resistance  - 1 x daily - 7 x weekly - 2 sets - 10 reps  GOALS: Goals reviewed with patient? Yes  SHORT TERM GOALS: Target date: 09/16/2024  Pt will be independent and compliant with introductory strength and balance focused HEP in order to maintain functional progress and improve mobility. Baseline:  Established on eval Goal status: INITIAL  2.  Pt will be assessed for most appropriate hyperextension management w/ edu on appropriate wear or use. Baseline: Discussed bracing vs taping options on eval. Goal status: INITIAL  3.  Pt will initiate aquatic therapy for improved balance management and strengthening. Baseline: To be scheduled. Goal status: INITIAL  4.  Pt will decrease 5xSTS to </=12 seconds w/o UE support in order to demonstrate decreased risk for falls and improved functional bilateral LE strength and power. Baseline: 14.54 sec no UE support Goal status: INITIAL  5.  Pt will demonstrate a gait speed of >/=2.75 feet/sec in order to decrease risk for falls. Baseline: 2.55 ft/sec no AD SBA Goal status: INITIAL  LONG TERM GOALS: Target date: 10/14/2024  Pt will be independent and compliant with advanced and finalized strength and balance focused HEP in order to maintain functional progress and improve mobility. Baseline: Established on eval Goal status: INITIAL  2.  Pt will improve FGA score to >/=26/30 in order to demonstrate improved balance and decreased fall risk. Baseline: 21/30 Goal status: INITIAL  3.  Pt will demonstrate a gait speed of >/=2.95 feet/sec in order to decrease risk for falls. Baseline: 2.55 ft/sec no AD SBA Goal  status: INITIAL  4.  Pt will ambulate >/=500 feet without AD independently over level and unlevel surfaces using appropriate knee control in order to promote household and community access. Baseline: ambulates w/ knee hyperextension no AD limited community distances SBA-CGA Goal status: INITIAL  ASSESSMENT:  CLINICAL IMPRESSION: Focus of skilled PT session today on furthering aquatic focused balance and strengthening.  More emphasis placed on SLS and core engagement today with higher level tasks.  She was very challenged by forward T and falling star using unstable UE support.  Her stepping strategy is strong, but she would benefit from time spent to upweight ankle and hip strategies to improve fluidity of gait mechanics and general dynamic stability.  PT will continue aquatic modality to compliment land based POC.  Pt benefits from continued skilled PT intervention to progress towards objective goals  as written and to address all remaining deficits related to CVA.    OBJECTIVE IMPAIRMENTS: Abnormal gait, decreased activity tolerance, decreased balance, decreased coordination, decreased knowledge of use of DME, decreased strength, and improper body mechanics.   ACTIVITY LIMITATIONS: lifting, standing, squatting, transfers, and locomotion level  PARTICIPATION LIMITATIONS: driving, shopping, community activity, and occupation  PERSONAL FACTORS: Past/current experiences and 1-2 comorbidities: MDD/panic disorder are also affecting patient's functional outcome.   REHAB POTENTIAL: Excellent  CLINICAL DECISION MAKING: Evolving/moderate complexity  EVALUATION COMPLEXITY: Moderate  PLAN:  PT FREQUENCY: 1x/week  PT DURATION: 8 weeks  PLANNED INTERVENTIONS: 97164- PT Re-evaluation, 97750- Physical Performance Testing, 97110-Therapeutic exercises, 97530- Therapeutic activity, [id number]- Neuromuscular re-education, 97535- Self Care, [id number]- Manual therapy, [id number]- Gait training, [phone]- Orthotic Initial,  [phone]- Orthotic/Prosthetic subsequent, [phone]- Aquatic Therapy, [phone]- Electrical stimulation (manual), Patient/Family education, Balance training, Stair training, Taping, Joint mobilization, Vestibular training, and DME instructions  PLAN FOR NEXT SESSION: Expand HEP - work on LLE strength - preventing hyperextension (knee cage vs sleeve vs taping?), SLS and high level stability, treadmill training, step ups and heel taps; ASSESS STGs  Aquatics:  Walking mechanics, lunges > add step, SL elevated STS, core, tandem walking; AQUATIC HEP?  Ai Chi  Check all possible CPT codes: See Planned Interventions List for Planned CPT Codes    Check all conditions that are expected to impact treatment: Neurological condition and/or seizures, Psychological or psychiatric disorders, and Social determinants of health   If treatment provided at initial evaluation, no treatment charged due to lack of authorization.    Daved KATHEE Bull, PT, DPT 09/22/2024, 11:42 AM

## 2024-09-23 ENCOUNTER — Ambulatory Visit: Payer: Self-pay | Admitting: Internal Medicine

## 2024-09-23 ENCOUNTER — Other Ambulatory Visit: Payer: Self-pay

## 2024-09-23 NOTE — Patient Outreach (Signed)
 Social Drivers of Health  Community Resource and Care Coordination Visit Note   09/23/2024  Name: Cassidy Bruce MRN: [id number] DOB:[birth year]  Situation: Referral received for [facility] needs assessment and assistance related to Financial Strain . I obtained verbal consent from Patient.  Visit completed with Patient on the phone.   Background:   SDOH Interventions Today    Flowsheet Row Most Recent Value  SDOH Interventions   Food Insecurity Interventions Intervention Not Indicated  [patient states she is doing ok with food. Pt gets food stamps.]  Housing Interventions Intervention Not Indicated  [aunt takes care of housing needs for patient.]  Transportation Interventions Patient Resources (Friends/Family)  [aunt provides transportation to and from appointments and other things. Aunt has car.]  Utilities Interventions Intervention Not Indicated  Financial Strain Interventions Intervention Not Indicated  [patient was working before stroke but now is not working at all.]     Assessment:   Goals Addressed             This Visit's Progress    BSW Goal   On track    Current SDOH Barriers:  Physicist, medical strain Dental/vision provider need  Interventions: Patient interviewed and appropriate screenings performed Referred patient to community resources  Provided patient with information about [employer] mediciad transportation and extra benefits through [facility]. Discussed plans with patient for ongoing follow up and provided patient with direct contact number BSW and pt will call [employer] at next f/u re wellness benefit.  Pt is actively working w/ the servant center for [employer] application.  BSW will provide dental and vision provider list via email.  BSW will in-basket PCP re pod referral.           Recommendation:   attend all scheduled provider appointments call for transportation assistance at least one week before appointments ask for help if you don't understand your health  insurance benefits Continue working with [facility] for SSDI/SSI application assistance - patient has already applied.   Follow Up Plan:   Telephone follow up appointment date/time:  10/07/2024 at 2pm  Laymon Doll, [place]/[facility] - [facility] Social Worker [phone]

## 2024-09-23 NOTE — Patient Instructions (Signed)
 Visit Information  Ms. Gartland was given information about Medicaid Managed Care team care coordination services as a part of their [facility] Community Plan Medicaid benefit.   If you would like to schedule transportation through your [facility], please call the following number at least 2 days in advance of your appointment: [phone]   Rides for urgent appointments can also be made after hours by calling Member Services.  Call the Behavioral Health Crisis Line at [phone], at any time, 24 hours a day, 7 days a week. If you are in danger or need immediate medical attention call 911.  Please see education materials related to transportation provided by email.  Care plan and visit instructions communicated with the patient verbally today. Patient agrees to receive a copy in MyChart. Active MyChart status and patient understanding of how to access instructions and care plan via MyChart confirmed with patient.     Telephone follow up appointment with Managed Medicaid care management team member scheduled for: 10/07/2024 at 2pm  Laymon Doll, [place]/[facility]BCI - [facility] Social Worker [phone]   Following is a copy of your plan of care:  There are no care plans that you recently modified to display for this patient.

## 2024-09-24 NOTE — Progress Notes (Deleted)
 Cardiology Office Note:   Date:  09/24/2024  ID:  Cassidy Bruce, DOB 08/01/1996, MRN [id number] PCP:  [name], NP  [facility] Providers Cardiologist:  Wendel Haws, MD Referring MD: No ref. provider found  Chief Complaint/Reason for Referral:  PFO/CVA ASSESSMENT:    1. Cerebrovascular accident (CVA) due to occlusion of left vertebral artery (HCC)   2. PFO (patent foramen ovale)   3. Vertebral artery dissection   4. Hyperlipidemia LDL goal <55      PLAN:   In order of problems listed above: CVA: Evaluation has been negative.  Will await CT CT scan to evaluate vertebral artery dissection prior to potential PFO closure.  Follow-up in 2 months.***  Continue DAPT with aspirin  81 mg and Plavix  75 mg for a total of 3 weeks then discontinue Plavix .  Continue rosuvastatin  40 mg PFO: See discussion above Vertebral artery dissection: Discussed with neurology.  Patient is scheduled for repeat CT scan in December.  Will follow-up on this prior to PFO closure.*** Hyperlipidemia: Given history of stroke goal LDL is less than 55.  Continue rosuvastatin  40 mg.            Dispo:  No follow-ups on file.       I spent 42 minutes reviewing all clinical data during and prior to this visit including all relevant imaging studies, laboratories, clinical information from other health systems and prior notes from both Cardiology and other specialties, interviewing the patient, conducting a complete physical examination, and coordinating care in order to formulate a comprehensive and personalized evaluation and treatment plan.   History of Present Illness:    FOCUSED PROBLEM LIST:   Dysarthria/ataxia September 2025 R cerebellar infarction Head CT  Bilateral cerebellar infarctions L>R Head MRI L vertebral dissection Head/neck CTA Not thought to be cause of CVA presentation PFO with bidirectional shunting, EF 60 to 65% TEE [name] grade 3 PFO TCD No DVT LE dopplers Negative  hypercoagulable panel No atrial fibrillation monitor 2025 ROPE score 10 ADHD Depression BMI 26 August 2024:  Patient consents to use of AI scribe. The patient is a 28 year old female with evaluation of problems who presented with an acute stroke last month.  Patient was in her normal state of health up until the day of presentation.  She developed what was initially thought is seizure-like activity, ataxia, and dysarthria.  She was diagnosed with bilateral cerebellar infarctions.  In the course of her workup a TEE demonstrated a small PFO and a TCD was positive.  Her CTA demonstrated no large vessel occlusions but did demonstrate a vertebral artery dissection on the left side with reconstitution.  Was not thought that this finding was related to her presentation given the bilateral infarctions seen.  Her hypercoagulable workup was negative.  She is ultimately discharged to rehabilitation and later discharged home.  She is here to discuss further management of her PFO in the context of a stroke.  She is not currently undergoing speech or physical therapy but plans to seek therapy services now that she has insurance. 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  She reports some residual weakness in her left arm and slight difficulty walking. She is right-handed. Her family notes that she sometimes carries a heavy bag, which exacerbates her symptoms, but she is learning to manage her limitations.  She is currently taking aspirin  and Plavix . No palpitations or issues with her medications since being discharged.  She lives in  [place] with her family, who are actively involved in her care. They are coordinating her visits and ensuring she remains in [place] for her recovery.  Plan:  Refer for monitor.  November 2025:  Patient consents to use of AI scribe. The patient's monitor demonstrated no atrial fibrillation.  She is here to discuss further.  I  did reach out to neurology who suggested that the patient should undergo repeat CT a to evaluate her dissection prior to potential PFO closure.     Current Medications: No outpatient medications have been marked as taking for the 09/28/24 encounter (Appointment) with Aisia Correira K, MD.     Review of Systems:   Please see the history of present illness.    All other systems reviewed and are negative.     EKGs/Labs/Other Test Reviewed:   EKG:  2025 NSR  EKG Interpretation Date/Time:    Ventricular Rate:    PR Interval:    QRS Duration:    QT Interval:    QTC Calculation:   R Axis:      Text Interpretation:          CARDIAC STUDIES: Refer to CV Procedures and Imaging Tabs   Risk Assessment/Calculations:          Physical Exam:   VS:  LMP 08/04/2024 (Exact Date)    No BP recorded.  {Refresh Note OR Click here to enter BP  :1}***   Wt Readings from Last 3 Encounters:  09/20/24 141 lb 6.4 oz (64.1 kg)  09/14/24 141 lb 12.8 oz (64.3 kg)  08/17/24 139 lb 3.2 oz (63.1 kg)      GENERAL:  No apparent distress, AOx3 HEENT:  No carotid bruits, +2 carotid impulses, no scleral icterus CAR: RRR no murmurs, gallops, rubs, or thrills RES:  Clear to auscultation bilaterally ABD:  Soft, nontender, nondistended, positive bowel sounds x 4 VASC:  +2 radial pulses, +2 carotid pulses NEURO:  CN 2-12 grossly intact; motor and sensory grossly intact PSYCH:  No active depression or anxiety EXT:  No edema, ecchymosis, or cyanosis  Signed, Abbigail Anstey K Antiono Ettinger, MD  09/24/2024 3:37 PM    [facility] [address] [place], [place], [place]  [address] Phone: [phone]; Fax: [phone]   Note:  This document was prepared using Dragon voice recognition software and may include unintentional dictation errors.

## 2024-09-25 ENCOUNTER — Encounter: Payer: Self-pay | Admitting: Neurology

## 2024-09-25 ENCOUNTER — Encounter: Payer: Self-pay | Admitting: Physical Medicine & Rehabilitation

## 2024-09-26 ENCOUNTER — Ambulatory Visit

## 2024-09-26 ENCOUNTER — Ambulatory Visit: Admitting: Physical Therapy

## 2024-09-26 ENCOUNTER — Other Ambulatory Visit: Payer: Self-pay | Admitting: *Deleted

## 2024-09-26 ENCOUNTER — Encounter: Payer: Self-pay | Admitting: Family

## 2024-09-26 ENCOUNTER — Encounter: Payer: Self-pay | Admitting: Physical Therapy

## 2024-09-26 ENCOUNTER — Encounter: Payer: Self-pay | Admitting: Internal Medicine

## 2024-09-26 ENCOUNTER — Other Ambulatory Visit: Payer: Self-pay | Admitting: Neurology

## 2024-09-26 ENCOUNTER — Other Ambulatory Visit: Payer: Self-pay | Admitting: Family

## 2024-09-26 DIAGNOSIS — M6281 Muscle weakness (generalized): Secondary | ICD-10-CM

## 2024-09-26 DIAGNOSIS — R208 Other disturbances of skin sensation: Secondary | ICD-10-CM

## 2024-09-26 DIAGNOSIS — R278 Other lack of coordination: Secondary | ICD-10-CM

## 2024-09-26 DIAGNOSIS — I69354 Hemiplegia and hemiparesis following cerebral infarction affecting left non-dominant side: Secondary | ICD-10-CM

## 2024-09-26 DIAGNOSIS — R2681 Unsteadiness on feet: Secondary | ICD-10-CM

## 2024-09-26 DIAGNOSIS — R29898 Other symptoms and signs involving the musculoskeletal system: Secondary | ICD-10-CM

## 2024-09-26 MED ORDER — EZETIMIBE 10 MG PO TABS: 10.0000 mg | ORAL_TABLET | Freq: Every day | ORAL | 3 refills | Status: DC

## 2024-09-26 NOTE — Patient Instructions (Signed)
 Access Code: [email or web address] URL: [email or web address] Date: 09/26/2024 Prepared by: Daved Bull  Exercises - Seated Hamstring Curls with Resistance  - 1 x daily - 5 x weekly - 2 sets - 10 reps - Standing Hamstring Curl with Resistance  - 1 x daily - 5 x weekly - 2 sets - 10 reps - Squat with Chair Touch and Resistance Loop  - 1 x daily - 5 x weekly - 2 sets - 12 reps - Side Stepping with Resistance at Thighs  - 1 x daily - 5 x weekly - 3 sets - 10 reps - Modified Single-Leg Deadlift  - 1 x daily - 5 x weekly - 2 sets - 10 reps - Walking Step Over  - 1 x daily - 5 x weekly - 3 sets - 10 reps

## 2024-09-26 NOTE — Therapy (Signed)
 OUTPATIENT PHYSICAL THERAPY NEURO TREATMENT   Patient Name: Cassidy Bruce MRN: [id number] DOB:[birth year], 28 y.o., female Today's Date: 09/26/2024   PCP: Greig JINNY Drones, NP REFERRING PROVIDER: Carilyn Prentice BRAVO, MD  END OF SESSION:  PT End of Session - 09/26/24 1106     Visit Number 5    Number of Visits 9   8 + eval   Date for Recertification  10/28/24   pushed out due to multi-D scheduling and aquatic needs   Authorization Type [place] Medicaid    PT Start Time 1102    PT Stop Time 1146    PT Time Calculation (min) 44 min    Equipment Utilized During Treatment Gait belt    Activity Tolerance Patient tolerated treatment well    Behavior During Therapy [facility] for tasks assessed/performed          Past Medical History:  Diagnosis Date   ADHD    Depression    Stroke (HCC) 07/05/2024   Past Surgical History:  Procedure Laterality Date   TRANSESOPHAGEAL ECHOCARDIOGRAM (CATH LAB) N/A 07/07/2024   Procedure: TRANSESOPHAGEAL ECHOCARDIOGRAM;  Surgeon: Lonni Slain, MD;  Location: [facility] INVASIVE CV LAB;  Service: Cardiovascular;  Laterality: N/A;   Patient Active Problem List   Diagnosis Date Noted   Severe episode of recurrent major depressive disorder, without psychotic features (HCC) 07/15/2024   Panic disorder with agoraphobia and moderate panic attacks 07/15/2024   Thromboembolic stroke (HCC) 07/13/2024   PFO (patent foramen ovale) 07/07/2024   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   Hypokalemia 07/06/2024   Stroke (HCC) 07/05/2024   MDD (major depressive disorder), recurrent episode, moderate (HCC) 07/25/2018   Adjustment disorder with mixed anxiety and depressed mood     ONSET DATE: 07/05/2024 (CVA)  REFERRING DIAG: I63.9 (ICD-10-CM) - Cerebral infarction, unspecified  THERAPY DIAG:  Other lack of coordination  Muscle weakness (generalized)  Other disturbances of skin sensation  Other symptoms and signs involving the musculoskeletal system  Hemiplegia  and hemiparesis following cerebral infarction affecting left non-dominant side (HCC)  Unsteadiness on feet  Rationale for Evaluation and Treatment: Rehabilitation  SUBJECTIVE:                                                                                                                                                                                             SUBJECTIVE STATEMENT: Pt presents ambulatory modified independently, using SBQC today.  No pain and no falls.  She reports she is awaiting participation information for a trial for an injectable drug from her neurologist.  She reports she is being put on Zetia . Pt accompanied by: family member (aunt in lobby -  pt does not drive herself)  PERTINENT HISTORY: MDD, CVA, PFO, panic disorder w/ agoraphobia and moderate panic attacks  Per inpt rehab discharge summary: Presented 07/05/2024 with dizziness slurred speech headache and gait abnormality with left-sided weakness. Per family she had went to the gym on Monday, 07/04/2024 for weightlifting as usual no specific problems. Tuesday morning she awoke from sleep with headache and neck pain and by the afternoon patient with increasing dizziness vertigo as well as slurred speech. Cranial CT scan showed no acute intracranial abnormalities new right cerebellar infarct since prior study of 2021 felt to be possibly chronic. 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 Distal V2 segment reconstitution but with attenuated enhancement relative to the contralateral side along the remainder of its course. A 13 mm region of ischemia affecting both cerebral hemispheres worse on the left with no more core infarct. MRI identified acute infarcts within the cerebellar vermis and bilateral cerebellar hemispheres. Most notably large acute infarct present within the superior cerebellar artery territories bilaterally. Posterior fossa mass effect without cerebellar tonsillar herniation or evidence of obstructive  hydrocephalus. Petechial hemorrhage within the cerebellar vermis and superior right cerebellar hemisphere. Patient did not receive TNK. No thrombectomy needed for left VA occlusion as BA was patent.  PAIN:  Are you having pain? No  PRECAUTIONS: Fall and Other: Zio heart monitor  RED FLAGS: None   WEIGHT BEARING RESTRICTIONS: No  FALLS: Has patient fallen in last 6 months? No and day of CVA she slid to the floor to lay down  LIVING ENVIRONMENT: Lives with: lives alone Lives in: Transitional housing - hotel Stairs: No - mostly uses elevator but will walk down the stairs Has following equipment at home: shower chair and elevator and triangle rollator  PLOF: Independent - she has been trying to navigate crowds (went to [employer] and [employer])  PATIENT GOALS: my balance  OBJECTIVE:  Note: Objective measures were completed at Evaluation unless otherwise noted.  DIAGNOSTIC FINDINGS:  Brain MRI 07/11/2024: IMPRESSION: 1. Evolving early subacute bilateral cerebellar infarcts, left slightly worse than right. Possible mild interval expansion versus changes of Wallerian degeneration (favored) involving the midbrain since previous. Associated petechial hemorrhage without frank hemorrhagic transformation. 2. Loss of normal flow void within the left vertebral artery, consistent with previously identified dissection. 3. Otherwise stable and normal brain MRI.  COGNITION: Overall cognitive status: Impaired and delayed processing and speech impairments/slurred speech   SENSATION: Light touch: WFL  COORDINATION: BLE RAMS:  LLE fatigues and slows with prolonged task Heel-to-shin:  mildly dysmetric LLE  EDEMA:  None noted in BLE  MUSCLE TONE: None noted in LLE  POSTURE: forward head - very mild  LOWER EXTREMITY ROM:     Active  Right Eval Left Eval  Hip flexion Grossly WNL  Hip extension   Hip abduction   Hip adduction   Hip internal rotation   Hip external rotation    Knee flexion   Knee extension   Ankle dorsiflexion   Ankle plantarflexion    Ankle inversion    Ankle eversion     (Blank rows = not tested)  LOWER EXTREMITY MMT:    MMT Right Eval Left Eval  Hip flexion 5 4+  Hip extension    Hip abduction 4+ 4  Hip adduction    Hip internal rotation    Hip external rotation    Knee flexion 5 4+  Knee extension 5 5  Ankle dorsiflexion 5 5  Ankle plantarflexion    Ankle inversion  Ankle eversion    (Blank rows = not tested)  BED MOBILITY:  Findings: Sit to supine Complete Independence Supine to sit Complete Independence Rolling to Right Complete Independence Rolling to Left Complete Independence  TRANSFERS: Sit to stand: Complete Independence  Assistive device utilized: None     Stand to sit: Complete Independence  Assistive device utilized: None     Chair to chair: SBA  Assistive device utilized: None       RAMP:  Not tested  CURB:  Not tested  STAIRS: Not tested GAIT: Findings: Gait Characteristics: step through pattern, decreased arm swing- Left, decreased stride length, and genu recurvatum- Left, Distance walked: various clinic distances, Assistive device utilized:None, Level of assistance: SBA and CGA, and Comments: mild lateral left knee thrust in stance  FUNCTIONAL TESTS:  5 times sit to stand: 14.54 sec no UE support 10 meter walk test: 12.94 sec no AD CGA = 0.77 m/sec OR 2.55 ft/sec Functional gait assessment:  FUNCTIONAL GAIT ASSESSMENT  Date: 08/19/2024 Score  GAIT LEVEL SURFACE Instructions: Walk at your normal speed from here to the next mark (6 m) [20 ft]. (2) Mild impairment - Walks 6 m (20 ft) in less than 7 seconds but greater than 5.5 seconds, uses assistive device, slower speed, mild gait deviations, or deviates 15.24 -25.4 cm (6 -10 in) outside of the 30.48-cm (12-in) walkway width.  2.   CHANGE IN GAIT SPEED Instructions: Begin walking at your normal pace (for 1.5 m [5 ft]). When I tell you "go," walk  as fast as you can (for 1.5 m [5 ft]). When I tell you "slow," walk as slowly as you can (for 1.5 m [5 ft]. (3) Normal - Able to smoothly change walking speed without loss of balance or gait deviation. Shows a significant difference in walking speeds between normal, fast, and slow speeds. Deviates no more than 15.24 cm (6 in) outside of the 30.48-cm (12-in) walkway width.  3.    GAIT WITH HORIZONTAL HEAD TURNS Instructions: Walk from here to the next mark 6 m (20 ft) away. Begin walking at your normal pace. Keep walking straight; after 3 steps, turn your head to the right and keep walking straight while looking to the right. After 3 more steps, turn your head to the left and keep walking straight while looking left. Continue alternating looking right and left. (2) Mild impairment - Performs head turns smoothly with slight change in gait velocity (eg, minor disruption to smooth gait path), deviates 15.24 -25.4 cm (6 -10 in) outside 30.48-cm (12-in) walkway width, or uses an assistive device.  4.   GAIT WITH VERTICAL HEAD TURNS Instructions: Walk from here to the next mark (6 m [20 ft]). Begin walking at your normal pace. Keep walking straight; after 3 steps, tip your head up and keep walking straight while looking up. After 3 more steps, tip your head down, keep walking straight while looking down. Continue  alternating looking up and down every 3 steps until you have completed 2 repetitions in each direction. (3) Normal - Performs head turns with no change in gait. Deviates no more than 15.24 cm (6 in) outside 30.48-cm (12-in) walkway width.  5.  GAIT AND PIVOT TURN Instructions: Begin with walking at your normal pace. When I tell you, "turn and stop," turn as quickly as you can to face the opposite direction and stop. (1) Moderate impairment - Turns slowly, requires verbal cueing, or requires several small steps to catch balance following turn and  stop  6.   STEP OVER OBSTACLE Instructions: Begin walking at  your normal speed. When you come to the shoe box, step over it, not around it, and keep walking. (1) Moderate impairment - Is able to step over one shoe box (11.43 cm [4.5 in] total height) but must slow down and adjust steps to clear box safely. May require verbal cueing.  7.   GAIT WITH NARROW BASE OF SUPPORT Instructions: Walk on the floor with arms folded across the chest, feet aligned heel to toe in tandem for a distance of 3.6 m [12 ft]. The number of steps taken in a straight line are counted for a maximum of 10 steps. (2) Mild impairment - Ambulates 7-9 steps  8.   GAIT WITH EYES CLOSED Instructions: Walk at your normal speed from here to the next mark (6 m [20 ft]) with your eyes closed. (2) Mild impairment - Walks 6 m (20 ft), uses assistive device, slower speed, mild gait deviations, deviates 15.24 -25.4 cm (6 -10 in) outside 30.48-cm (12-in) walkway width. Ambulates 6 m (20 ft) in less than 9 seconds but greater than 7 seconds  9.   AMBULATING BACKWARDS Instructions: Walk backwards until I tell you to stop (2) Mild impairment - Walks 6 m (20 ft), uses assistive device, slower speed, mild gait deviations, deviates 15.24 -25.4 cm (6 -10 in) outside 30.48-cm (12-in) walkway width  10. STEPS Instructions: Walk up these stairs as you would at home (ie, using the rail if necessary). At the top turn around and walk down. (3) Normal-Alternating feet, no rail.  Total 21/30   Interpretation of scores: Non-Specific Older Adults Cutoff Score: <=22/30 = risk of falls Parkinson's Disease Cutoff score <15/30= fall risk (Hoehn & Yahr 1-4)  Minimally Clinically Important Difference (MCID)  Stroke (acute, subacute, and chronic) = MDC: 4.2 points Vestibular (acute) = MDC: 6 points Community Dwelling Older Adults =  MCID: 4 points Parkinson's Disease  =  MDC: 4.3 points  (Academy of Neurologic Physical Therapy (nd). Functional Gait Assessment. Retrieved from  https://www.neuropt.org/docs/default-source/cpgs/core-outcome-measures/function-gait-assessment-pocket-guide-proof9-(2).pdf?sfvrsn=b17f35043_0.)  PATIENT SURVEYS:  None relevant to chief complaint and age range.                                                                                                                              TREATMENT DATE: 09/26/2024  -5xSTS:  17.53 sec no UE support; great bilateral knee control -5xSTS:  10.59 sec no UE support; maintained great bil knee control at fastest volitional speed - no AD:  9.91 sec no AD SBA = 1.01 m/sec OR 3.33 ft/sec  - Squat with Chair Touch and Resistance Loop  - 2 sets - 12 reps - Side Stepping with Resistance at Thighs  - 1 x daily - 5 x weekly - 3 sets - 10 reps - Modified Single-Leg Deadlift  - 2 sets - 10 reps - Walking Step Over  - 3 sets - 10 reps  -Donned  knee cage w/ 3 folded washcloths to bolster fit as orthotist was unable to provide samples of smaller sizes - hyperextension not well controlled and pt not sure of pursuing brace at this time, PT feels appropriate sized brace may offer more benefit and this edu provided to pt.  Discussed obtaining order around re-cert time if pt wanting to pursue - she verbalizes understanding.  Also discussed her progression of L knee control and strength and how this is likely to further improve over time with practice, but the brace would help protect the joint early on.  PATIENT EDUCATION: Education details: Continue HEP.  Knee cage - see above. Person educated: Patient Education method: Explanation, Demonstration, Verbal cues, and Handouts Education comprehension: verbalized understanding and needs further education  HOME EXERCISE PROGRAM: Access Code: [email or web address] URL: [email or web address] Date: 08/19/2024 Prepared by: Daved Bull  Exercises - Seated Hamstring Curls with Resistance  - 1 x daily - 7 x weekly - 2 sets - 10 reps - Standing Hamstring Curl with  Resistance  - 1 x daily - 7 x weekly - 2 sets - 10 reps - Squat with Chair Touch and Resistance Loop  - 1 x daily - 5 x weekly - 2 sets - 12 reps - Side Stepping with Resistance at Thighs  - 1 x daily - 5 x weekly - 3 sets - 10 reps - Modified Single-Leg Deadlift  - 1 x daily - 5 x weekly - 2 sets - 10 reps - Walking Step Over  - 1 x daily - 5 x weekly - 3 sets - 10 reps  GOALS: Goals reviewed with patient? Yes  SHORT TERM GOALS: Target date: 09/16/2024  Pt will be independent and compliant with introductory strength and balance focused HEP in order to maintain functional progress and improve mobility. Baseline:  Established on eval; progressed 11/24 Goal status: IN PROGRESS  2.  Pt will be assessed for most appropriate hyperextension management w/ edu on appropriate wear or use. Baseline: Discussed bracing vs taping options on eval; holding on knee cage as pt thinks it over (11/24) Goal status: IN PROGRESS  3.  Pt will initiate aquatic therapy for improved balance management and strengthening. Baseline: To be scheduled; pt actively in aquatics (11/24) Goal status: MET  4.  Pt will decrease 5xSTS to </=12 seconds w/o UE support in order to demonstrate decreased risk for falls and improved functional bilateral LE strength and power. Baseline: 14.54 sec no UE support; 10.59 sec no UE support (11/24) Goal status: MET  5.  Pt will demonstrate a gait speed of >/=2.75 feet/sec in order to decrease risk for falls. Baseline: 2.55 ft/sec no AD SBA; 3.33 ft/sec no AD SBA (11/24) Goal status: MET  LONG TERM GOALS: Target date: 10/14/2024  Pt will be independent and compliant with advanced and finalized strength and balance focused HEP in order to maintain functional progress and improve mobility. Baseline: Established on eval Goal status: INITIAL  2.  Pt will improve FGA score to >/=26/30 in order to demonstrate improved balance and decreased fall risk. Baseline: 21/30 Goal status:  INITIAL  3.  Pt will demonstrate a gait speed of >/=3.53 feet/sec in order to decrease risk for falls. Baseline: 2.55 ft/sec no AD SBA; 3.33 ft/sec no AD SBA (11/24) Goal status: REVISED  4.  Pt will ambulate >/=500 feet without AD independently over level and unlevel surfaces using appropriate knee control in order to promote household and community access. Baseline: ambulates w/ knee hyperextension no AD  limited community distances SBA-CGA Goal status: INITIAL  ASSESSMENT:  CLINICAL IMPRESSION: Focus of skilled PT session today on assessing STGs with pt making excellent progress in BLE strength and gait speed.  Her left knee hyperextension appears some improved, but could still benefit from joint protection possibly with a better fitted knee cage than what is available in this clinic.  She will notify therapist if she would like to pursue order and process to obtain properly fitted knee cage.  Updated HEP today to improve SLS and posterior chain engagement.  She was very challenged by B stance RDLs, but improves motor control with repetition.  Continue per POC.    OBJECTIVE IMPAIRMENTS: Abnormal gait, decreased activity tolerance, decreased balance, decreased coordination, decreased knowledge of use of DME, decreased strength, and improper body mechanics.   ACTIVITY LIMITATIONS: lifting, standing, squatting, transfers, and locomotion level  PARTICIPATION LIMITATIONS: driving, shopping, community activity, and occupation  PERSONAL FACTORS: Past/current experiences and 1-2 comorbidities: MDD/panic disorder are also affecting patient's functional outcome.   REHAB POTENTIAL: Excellent  CLINICAL DECISION MAKING: Evolving/moderate complexity  EVALUATION COMPLEXITY: Moderate  PLAN:  PT FREQUENCY: 1x/week  PT DURATION: 8 weeks  PLANNED INTERVENTIONS: 97164- PT Re-evaluation, 97750- Physical Performance Testing, 97110-Therapeutic exercises, 97530- Therapeutic activity, [id number]-  Neuromuscular re-education, 97535- Self Care, [id number]- Manual therapy, [id number]- Gait training, [id number]- Orthotic Initial, [id number]- Orthotic/Prosthetic subsequent, [phone]- Aquatic Therapy, [id number]- Electrical stimulation (manual), Patient/Family education, Balance training, Stair training, Taping, Joint mobilization, Vestibular training, and DME instructions  PLAN FOR NEXT SESSION: Expand HEP - work on LLE strength - preventing hyperextension (knee cage vs sleeve vs taping?), SLS and high level stability, treadmill training, step ups and heel taps; pursue knee cage order at re-cert if pt desires.  Aquatics:  Walking mechanics, lunges > add step, SL elevated STS, core, tandem walking; AQUATIC HEP?  [facility]  Check all possible CPT codes: See Planned Interventions List for Planned CPT Codes    Check all conditions that are expected to impact treatment: Neurological condition and/or seizures, Psychological or psychiatric disorders, and Social determinants of health   If treatment provided at initial evaluation, no treatment charged due to lack of authorization.    Daved KATHEE Bull, PT, DPT 09/26/2024, 12:34 PM

## 2024-09-26 NOTE — Telephone Encounter (Signed)
 Copied from CRM [id number]. Topic: Clinical - Medication Refill >> Sep 26, 2024  3:50 PM Shanda MATSU wrote: Medication:  ezetimibe  (ZETIA ) 10 MG tablet  aspirin  EC 81 MG tablet   acetaminophen  (TYLENOL ) 500 MG tablet   butalbital -acetaminophen -caffeine  (FIORICET ) 50-325-40 MG tablet   citalopram  (CELEXA ) 20 MG tablet  clopidogrel  (PLAVIX ) 75 MG tablet  oxyCODONE  (OXY IR/ROXICODONE ) 5 MG immediate release tablet  rosuvastatin  (CRESTOR ) 40 MG tablet  Vitamin D , Ergocalciferol , (DRISDOL ) 1.25 MG (50000 UNIT) CAPS capsule  Has the patient contacted their pharmacy? Yes, referred to provider (Agent: If no, request that the patient contact the pharmacy for the refill. If patient does not wish to contact the pharmacy document the reason why and proceed with request.) (Agent: If yes, when and what did the pharmacy advise?)  This is the patient's preferred pharmacy:  [facility] [phone] - [place], [place] - [address] AT [facility] [address]  [place] [address] Phone: [phone] Fax: [phone]  Is this the correct pharmacy for this prescription? Yes If no, delete pharmacy and type the correct one.   Has the prescription been filled recently? No  Is the patient out of the medication? Yes  Has the patient been seen for an appointment in the last year OR does the patient have an upcoming appointment? Yes  Can we respond through MyChart? Yes  Agent: Please be advised that Rx refills may take up to 3 business days. We ask that you follow-up with your pharmacy.

## 2024-09-26 NOTE — Progress Notes (Signed)
 Called [facility] on [address] and canceled Repatha . Told pharmacist we are doing Zetia  in combination with Rosuvastatin  instead.

## 2024-09-26 NOTE — Therapy (Signed)
 OUTPATIENT OCCUPATIONAL THERAPY NEURO EVALUATION  Patient Name: Cassidy Bruce MRN: [id number] DOB:[birth year], 28 y.o., female Today's Date: 09/26/2024  PCP: None REFERRING PROVIDER: Carilyn Prentice BRAVO, MD  END OF SESSION:  OT End of Session - 09/26/24 1203     Visit Number 5    Number of Visits 9    Date for Recertification  10/14/24    Authorization Type [employer] MCD- no auth required    Authorization Time Period VL: [place]    OT Start Time 1149    OT Stop Time 1230    OT Time Calculation (min) 41 min    Equipment Utilized During Financial Controller, testing materials, Psychologist, Counselling game, small pegs and pegboard    Activity Tolerance Patient tolerated treatment well    Behavior During Therapy WFL for tasks assessed/performed              Past Medical History:  Diagnosis Date   ADHD    Depression    Stroke (HCC) 07/05/2024   Past Surgical History:  Procedure Laterality Date   TRANSESOPHAGEAL ECHOCARDIOGRAM (CATH LAB) N/A 07/07/2024   Procedure: TRANSESOPHAGEAL ECHOCARDIOGRAM;  Surgeon: Lonni Slain, MD;  Location: [facility] INVASIVE CV LAB;  Service: Cardiovascular;  Laterality: N/A;   Patient Active Problem List   Diagnosis Date Noted   Severe episode of recurrent major depressive disorder, without psychotic features (HCC) 07/15/2024   Panic disorder with agoraphobia and moderate panic attacks 07/15/2024   Thromboembolic stroke (HCC) 07/13/2024   PFO (patent foramen ovale) 07/07/2024   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   Hypokalemia 07/06/2024   Stroke (HCC) 07/05/2024   MDD (major depressive disorder), recurrent episode, moderate (HCC) 07/25/2018   Adjustment disorder with mixed anxiety and depressed mood     ONSET DATE: Admit date: 07/13/2024 Discharge date: 08/03/2024  Referral date: 08/16/24  REFERRING DIAG: I63.9 (ICD-10-CM) - Cerebral infarction, unspecified  THERAPY DIAG:  Other lack of coordination  Muscle weakness  (generalized)  Hemiplegia and hemiparesis following cerebral infarction affecting left non-dominant side (HCC)  Rationale for Evaluation and Treatment: Rehabilitation  SUBJECTIVE:   SUBJECTIVE STATEMENT: Pt reports things are going well, continues to complete putty and strengthening HEPs as instructed, no questions on any HEPs given.  Pt accompanied by: self, aunt dropped her off.  PERTINENT HISTORY: Presented 07/05/2024 with dizziness slurred speech headache and gait abnormality with left-sided weakness. Per family she had went to the gym on Monday, 07/04/2024 for weightlifting as usual no specific problems. Tuesday morning she awoke from sleep with headache and neck pain and by the afternoon patient with increasing dizziness vertigo as well as slurred speech. Cranial CT scan showed no acute intracranial abnormalities new right cerebellar infarct since prior study of 2021 felt to be possibly chronic. 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 Distal V2 segment reconstitution but with attenuated enhancement relative to the contralateral side along the remainder of its course. A 13 mm region of ischemia affecting both cerebral hemispheres worse on the left with no more core infarct. MRI identified acute infarcts within the cerebellar vermis and bilateral cerebellar hemispheres. Most notably large acute infarct present within the superior cerebellar artery territories bilaterally. Posterior fossa mass effect without cerebellar tonsillar herniation or evidence of obstructive hydrocephalus. Petechial hemorrhage within the cerebellar vermis and superior right cerebellar hemisphere.  Other PMH includes: MDD (major depressive disorder), recurrent episode, moderate (HCC)   Severe episode of recurrent major depressive disorder, without psychotic features (HCC)   Panic disorder with agoraphobia and moderate panic attacks PFO Left  VA occlusion possible  dissection Hyperlipidemia MDD/ADHD Microcytosis  PRECAUTIONS: FallL sided weakness, blood thinners   WEIGHT BEARING RESTRICTIONS: No  PAIN:  Are you having pain? No  FALLS: Has patient fallen in last 6 months? Yes. 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   OBJECTIVE:  Note: Objective measures were completed at Evaluation unless otherwise noted.  HAND DOMINANCE: Right  ADLs: Overall ADLs: Independent Equipment: none  IADLs: Shopping: Haven't attempted yet  Light housekeeping: Independent Meal Prep: Hasn't attempted yet  Community mobility: Not driving but was before stroke, aunt brought her to appt this date. Not cleared to drive at this time. Medication management: Reports doing things pretty fine, though sometimes takes with or without food, does not take at same time consistently, especially for meds in the morning as pt tends to sleep late. Financial management: Reports it is a little stressful d/t not working at this time and being unable to pay.  Handwriting: 100% legible and Mild micrographia  MOBILITY STATUS: Independent  POSTURE COMMENTS:  No Significant postural limitations Sitting balance: WNL  ACTIVITY TOLERANCE: Activity tolerance: Reports getting a little more winded   FUNCTIONAL OUTCOME MEASURES: Upper Extremity Functional Scale (UEFS): 63/80, functioning at 78.75%, 21.25% impairment  UPPER EXTREMITY ROM:  WNL  Active ROM Right eval Left eval  Shoulder flexion    Shoulder abduction    Shoulder adduction    Shoulder extension    Shoulder internal rotation     Shoulder external rotation    Elbow flexion    Elbow extension    Wrist flexion    Wrist extension    Wrist ulnar deviation    Wrist radial deviation    Wrist pronation    Wrist supination    (Blank rows = not tested)  UPPER EXTREMITY MMT:   WFL,   MMT Right eval Left eval  Shoulder flexion    Shoulder abduction    Shoulder adduction    Shoulder extension    Shoulder internal rotation    Shoulder external rotation    Middle trapezius    Lower trapezius    Elbow flexion  4  Elbow extension  4  Wrist flexion    Wrist extension    Wrist ulnar deviation    Wrist radial deviation    Wrist pronation    Wrist supination    (Blank rows = not tested)  HAND FUNCTION: Grip strength: Right: 50.9 lbs; Left: 27.3 lbs  COORDINATION: 9 Hole Peg test: Right: 44.26 sec; Left: 75.78 sec (Pt was wearing artificial nails) Box and Blocks:  Right 49 blocks, Left 24 blocks  SENSATION: WFL  EDEMA: none  MUSCLE TONE: LUE: Within functional limits  COGNITION: Overall cognitive status: Within functional limits for tasks assessed  VISION: Subjective report: no changes  Baseline vision: Wears glasses for distance only, however cannot find her glasses at this time Visual history: none  VISION ASSESSMENT: To be further assessed in functional context  Patient has difficulty with following activities due to following visual impairments: can't see well with distance  PERCEPTION: Not tested  PRAXIS: Not tested  OBSERVATIONS: limited grip strength and coordination in affected L hand as well as L shoulder and elbow strength. This is affecting some IADL/ADL tasks as well as ability to return to work.                                                                                                                             TREATMENT DATE: 09/26/24  Re-assessed 9HPT, grip strength, and Box and Blocks for L hand, see Goals for updated measures. Informed pt of progress made.  Pt  participated in activities honing FM coordination of L hand, completing tip to tip pinch inserting Perfection pieces into board. Next utilized tweezers in L hand and removed items from game board. Finally, completed tip to tip pinch recreating pattern on peg board with small pegs.     PATIENT EDUCATION: Education details: SEE ABOVE Person educated: Patient Education method: Explanation, Demonstration, Tactile cues, Verbal cues, and Handouts Education comprehension: verbalized understanding, returned demonstration, tactile cues required, and needs further education  HOME EXERCISE PROGRAM: FM coordination and putty (08/30/24: [email or web address]) 09/06/24: Access Code: [email or web address] URL: [email or web address] Date: 09/06/2024 Prepared by: Rocky Dutch  Exercises - Seated Elbow Flexion with Self-Anchored Resistance  - 1 x daily - 5 x weekly - 3 sets - 10 reps - Seated Elbow Extension with Self-Anchored Resistance  - 1 x daily - 5 x weekly - 3 sets - 10 reps - Seated Shoulder Horizontal Abduction with Resistance  - 1 x daily - 5 x weekly - 3 sets - 10 reps - Seated Shoulder Flexion with Self-Anchored Resistance  - 1 x daily - 5 x weekly - 3 sets - 10 reps   GOALS: Goals reviewed with patient? Yes  SHORT TERM GOALS: Target date: 09/16/24  Pt to be independent with HEP for LUE strength and coordination Baseline: New to OP OT Goal status: IN PROGRESS  2.  Pt will improve LUE elbow strength to 5/5 for improved functional transfers and functional use Baseline: 4/5  09/12/24: 5/5  Goal status: GOAL MET  3.  Pt will demonstrate improved function in LUE by scoring at least 34 on Box and Blocks test  Baseline: Right 49 blocks, Left 24 blocks   09/12/24: 27 blocks 09/26/24: 33 blocks Goal status: IN PROGRESS  4.  Pt will demonstrate improved FM coordination in L hand by completing 9 Hole Peg Test in no more than 60 seconds  Baseline: 9 Hole Peg test: Right: 44.26 sec; Left: 75.78  sec 09/12/24: Left: 77 seconds, did drop 2 when taking them out 09/26/24: 73 seconds L hand Goal status: IN PROGRESS  5.  Pt will increase L grip strength by at least 10 lbs for improved functional use  Baseline: 27.3 lbs L hand 09/06/24: 44.7 lbs L hand Goal status: GOAL MET   LONG TERM GOALS: Target date: 10/14/24  Pt will demonstrate improved functional capability by scoring no less than 71/80 on UEFS Baseline: 63/80  Goal status: INITIAL  2.  Pt will increase L grip strength by at least 20 pounds for improved ability to open jars during meal prep tasks  Baseline: 27.3 lbs L hand 09/06/24: 44.7 lbs L hand 09/26/24: 46.2 L hand Goal status: IN PROGRESS  3.  Pt will complete light meal prep task with no rest breaks  Baseline: New to OP OT, pt reports not attempting since CVA. Goal status: IN PROGRESS  4.  Pt will demonstrate improved bilateral coordination by a score of no less than 30 seconds on each hand on 9 Hole Peg Test  Baseline: 9 Hole Peg test: Right: 44.26 sec; Left: 75.78 sec (pt was wearing artificial nails) 09/12/24: Left: 77 seconds, did drop 2 when taking them out and still was wearing artificial nails.  09/26/24: 73 seconds L hand, 25 seconds R hand Goal status: IN PROGRESS  5. Pt will demonstrate improved function in BUE by scoring at least 45 on Box and Blocks test  Baseline: Right 49 blocks, Left 24 blocks 09/12/24: 27 blocks, L hand 09/26/24: 33 blocks L hand Goal status: MODIFIED    ASSESSMENT:  CLINICAL IMPRESSION: Patient is a 28 y.o. female who was seen today for occupational therapy tx or L sided weakness s/p thromboembolic stroke. Hx includes MDD, panic disorder, HLD, microcytosis. Pt has demonstrated improved FM coordination in B hands and grip strength. Pt would benefit from continued skilled services to improve ability to complete IADLs/ADLs   PERFORMANCE DEFICITS: in functional skills including ADLs, IADLs, coordination, dexterity, strength,  Fine motor control, Gross motor control, body mechanics, endurance, and UE functional use, cognitive skills including attention and energy/drive, and psychosocial skills including coping strategies, environmental adaptation, and habits.   IMPAIRMENTS: are limiting patient from ADLs, IADLs, work, and leisure.   CO-MORBIDITIES: may have co-morbidities  that affects occupational performance. Patient will benefit from skilled OT to address above impairments and improve overall function.  MODIFICATION OR ASSISTANCE TO COMPLETE EVALUATION: Min-Moderate modification of tasks or assist with assess necessary to complete an evaluation.  OT OCCUPATIONAL PROFILE AND HISTORY: Detailed assessment: Review of records and additional review of physical, cognitive, psychosocial history related to current functional performance.  CLINICAL DECISION MAKING: Moderate - several treatment options, min-mod task modification necessary  REHAB POTENTIAL: Good  EVALUATION COMPLEXITY: Moderate   For all possible CPT codes, reference the Planned Interventions line above.     Check all conditions that are expected to impact treatment: {Conditions expected to impact treatment:Musculoskeletal disorders and Neurological condition and/or seizures   If treatment provided at initial evaluation, no treatment charged due to lack of authorization.      PLAN:  OT FREQUENCY: 1x/week  OT DURATION: 8 weeks  PLANNED INTERVENTIONS: 97168 OT Re-evaluation, 97535 self care/ADL training, 02889 therapeutic exercise, 97530 therapeutic activity, 97112 neuromuscular re-education, 97140 manual therapy, 97760 Orthotic Initial, 97763 Orthotic/Prosthetic subsequent, passive range of motion, functional mobility training, energy conservation, patient/family education, and DME and/or AE instructions  RECOMMENDED OTHER SERVICES: none, pt getting PT and SLP eval  CONSULTED AND AGREED WITH PLAN OF CARE: Patient  PLAN FOR NEXT SESSION:  Coordination and grip strength tasks  UE strengthening tasks Kitchen simulation   [place], OT 09/26/2024, 12:43 PM

## 2024-09-27 NOTE — Telephone Encounter (Signed)
 Requested medication (s) are due for refill today: yes  Requested medication (s) are on the active medication list: yes  Last refill:  multiple dates  Future visit scheduled: no  Notes to clinic:  Unable to refill per protocol, last refill by another provider.      Requested Prescriptions  Pending Prescriptions Disp Refills   ezetimibe  (ZETIA ) 10 MG tablet 30 tablet 3    Sig: Take 1 tablet (10 mg total) by mouth daily.     Cardiovascular:  Antilipid - Sterol Transport Inhibitors Failed - 09/27/2024  3:26 PM      Failed - ALT in normal range and within 360 days    ALT  Date Value Ref Range Status  07/18/2024 55 (H) 0 - 44 U/L Final         Failed - Lipid Panel in normal range within the last 12 months    Cholesterol  Date Value Ref Range Status  07/06/2024 179 0 - 200 mg/dL Final    Comment:           ATP III CLASSIFICATION:  <200     mg/dL   Desirable  799-760  mg/dL   Borderline High  >=759    mg/dL   High           LDL Cholesterol  Date Value Ref Range Status  07/06/2024 104 (H) 0 - 99 mg/dL Final    Comment:           Total Cholesterol/HDL:CHD Risk Coronary Heart Disease Risk Table                     Men   Women  1/2 Average Risk   3.4   3.3  Average Risk       5.0   4.4  2 X Average Risk   9.6   7.1  3 X Average Risk  23.4   11.0        Use the calculated Patient Ratio above and the CHD Risk Table to determine the patient's CHD Risk.        ATP III CLASSIFICATION (LDL):  <100     mg/dL   Optimal  899-870  mg/dL   Near or Above                    Optimal  130-159  mg/dL   Borderline  839-810  mg/dL   High  >809     mg/dL   Very High Performed at [facility], [address], [place], [place] [address]    HDL  Date Value Ref Range Status  07/06/2024 67 >40 mg/dL Final   Triglycerides  Date Value Ref Range Status  07/06/2024 42 <150 mg/dL Final         Passed - AST in normal range and within 360 days    AST  Date Value  Ref Range Status  07/18/2024 25 15 - 41 U/L Final         Passed - Patient is not pregnant      Passed - Valid encounter within last 12 months    Recent Outpatient Visits           6 days ago Encounter to establish care   [facility] at [facility], [place], [place]       Future Appointments             Tomorrow Thukkani, Arun K, MD [facility]  HeartCare at [employer] [facility] [employer]. [facility], [facility]             aspirin  EC 81 MG tablet 30 tablet     Sig: Take 1 tablet (81 mg total) by mouth daily. Swallow whole.     Analgesics:  NSAIDS - aspirin  Passed - 09/27/2024  3:26 PM      Passed - Cr in normal range and within 360 days    Creatinine, Ser  Date Value Ref Range Status  08/01/2024 0.99 0.44 - 1.00 mg/dL Final         Passed - eGFR is 10 or above and within 360 days    GFR calc Af Amer  Date Value Ref Range Status  08/24/2019 >60 >60 mL/min Final   GFR, Estimated  Date Value Ref Range Status  08/01/2024 >60 >60 mL/min Final    Comment:    (NOTE) Calculated using the CKD-EPI Creatinine Equation (2021)          Passed - Patient is not pregnant      Passed - Valid encounter within last 12 months    Recent Outpatient Visits           6 days ago Encounter to establish care   [facility] at [facility], [place], [place]       Future Appointments             Tomorrow Thukkani, Arun K, MD [facility] HeartCare at [facility] Dept of [facility] [place] [facility], [facility]             acetaminophen  (TYLENOL ) 500 MG tablet 30 tablet     Sig: Take 1 tablet (500 mg total) by mouth every 6 (six) hours as needed.     Over the counter: OTC - acetaminophen  Failed - 09/27/2024  3:26 PM      Failed - ALT in normal range and within 360 days    ALT  Date Value Ref Range Status  07/18/2024 55 (H) 0 - 44 U/L Final         Passed - Cr in normal range and within 360 days    Creatinine, Ser  Date Value Ref Range Status  08/01/2024 0.99 0.44 -  1.00 mg/dL Final         Passed - AST in normal range and within 360 days    AST  Date Value Ref Range Status  07/18/2024 25 15 - 41 U/L Final         Passed - Valid encounter within last 12 months    Recent Outpatient Visits           6 days ago Encounter to establish care   [facility] at [facility], [place], [place]       Future Appointments             Tomorrow Thukkani, Arun K, MD [facility] HeartCare at [facility] Dept of [facility] [place] [facility], [facility]             butalbital -acetaminophen -caffeine  (FIORICET ) 50-325-40 MG tablet 10 tablet 0    Sig: Take 1 tablet by mouth every 8 (eight) hours as needed for headache (if not effective with tylenol ).     Not Delegated - Analgesics:  Non-Opioid Analgesic Combinations 2 Failed - 09/27/2024  3:26 PM      Failed - This refill cannot be delegated  Passed - Cr in normal range and within 360 days    Creatinine, Ser  Date Value Ref Range Status  08/01/2024 0.99 0.44 - 1.00 mg/dL Final         Passed - eGFR is 10 or above and within 360 days    GFR calc Af Amer  Date Value Ref Range Status  08/24/2019 >60 >60 mL/min Final   GFR, Estimated  Date Value Ref Range Status  08/01/2024 >60 >60 mL/min Final    Comment:    (NOTE) Calculated using the CKD-EPI Creatinine Equation (2021)          Passed - Patient is not pregnant      Passed - Valid encounter within last 12 months    Recent Outpatient Visits           6 days ago Encounter to establish care   [facility] at [facility], [place], [place]       Future Appointments             Tomorrow Thukkani, Arun K, MD [facility] HeartCare at [name] A Dept of [facility] [place] [facility], [facility]             citalopram  (CELEXA ) 20 MG tablet 30 tablet 0    Sig: Take 1 tablet (20 mg total) by mouth daily.     Psychiatry:  Antidepressants - SSRI Passed - 09/27/2024  3:26 PM      Passed - Completed PHQ-2 or PHQ-9 in the last 360 days       Passed - Valid encounter within last 6 months    Recent Outpatient Visits           6 days ago Encounter to establish care   [facility] at [facility], [place], [place]       Future Appointments             Tomorrow Thukkani, Arun K, MD [facility] HeartCare at [facility] A Dept of [facility] [place] [facility], [facility]             clopidogrel  (PLAVIX ) 75 MG tablet 30 tablet 0    Sig: Take 1 tablet (75 mg total) by mouth daily.     Hematology: Antiplatelets - clopidogrel  Failed - 09/27/2024  3:26 PM      Failed - PLT in normal range and within 180 days    Platelets  Date Value Ref Range Status  08/01/2024 93 (L) 150 - 400 K/uL Final    Comment:    REPEATED TO VERIFY Immature Platelet Fraction may be clinically indicated, consider ordering this additional test [id number]          Passed - HCT in normal range and within 180 days    HCT  Date Value Ref Range Status  08/01/2024 38.0 36.0 - 46.0 % Final         Passed - HGB in normal range and within 180 days    Hemoglobin  Date Value Ref Range Status  08/01/2024 12.8 12.0 - 15.0 g/dL Final         Passed - Cr in normal range and within 360 days    Creatinine, Ser  Date Value Ref Range Status  08/01/2024 0.99 0.44 - 1.00 mg/dL Final         Passed - Valid encounter within last 6 months    Recent Outpatient Visits  6 days ago Encounter to establish care   [facility] Care at [facility], [place], [place]       Future Appointments             Tomorrow Thukkani, Arun K, MD [facility] HeartCare at [facility] A Dept of The [place]. [facility], [facility]             oxyCODONE  (OXY IR/ROXICODONE ) 5 MG immediate release tablet 10 tablet 0    Sig: Take 1 tablet (5 mg total) by mouth every 4 (four) hours as needed for breakthrough pain.     Not Delegated - Analgesics:  Opioid Agonists Failed - 09/27/2024  3:26 PM      Failed - This refill cannot be delegated      Failed - Urine Drug Screen completed  in last 360 days      Passed - Valid encounter within last 3 months    Recent Outpatient Visits           6 days ago Encounter to establish care   [facility] at [facility], [place], [place]       Future Appointments             Tomorrow Thukkani, Arun K, MD [facility] HeartCare at [facility] of [facility] [place] [facility], [facility]             rosuvastatin  (CRESTOR ) 40 MG tablet 30 tablet 0    Sig: Take 1 tablet (40 mg total) by mouth daily.     Cardiovascular:  Antilipid - Statins 2 Failed - 09/27/2024  3:26 PM      Failed - Lipid Panel in normal range within the last 12 months    Cholesterol  Date Value Ref Range Status  07/06/2024 179 0 - 200 mg/dL Final    Comment:           ATP III CLASSIFICATION:  <200     mg/dL   Desirable  799-760  mg/dL   Borderline High  >=759    mg/dL   High           LDL Cholesterol  Date Value Ref Range Status  07/06/2024 104 (H) 0 - 99 mg/dL Final    Comment:           Total Cholesterol/HDL:CHD Risk Coronary Heart Disease Risk Table                     Men   Women  1/2 Average Risk   3.4   3.3  Average Risk       5.0   4.4  2 X Average Risk   9.6   7.1  3 X Average Risk  23.4   11.0        Use the calculated Patient Ratio above and the CHD Risk Table to determine the patient's CHD Risk.        ATP III CLASSIFICATION (LDL):  <100     mg/dL   Optimal  899-870  mg/dL   Near or Above                    Optimal  130-159  mg/dL   Borderline  839-810  mg/dL   High  >809     mg/dL   Very High Performed at [facility], [address] [place], [place] [address]    HDL  Date Value Ref Range Status  07/06/2024 67 >40 mg/dL Final   Triglycerides  Date Value Ref Range Status  07/06/2024 42 <150 mg/dL Final         Passed - Cr in normal range and within 360 days    Creatinine, Ser  Date Value Ref Range Status  08/01/2024 0.99 0.44 - 1.00 mg/dL Final         Passed - Patient is not pregnant       Passed - Valid encounter within last 12 months    Recent Outpatient Visits           6 days ago Encounter to establish care   [facility] at [facility], [place], [place]       Future Appointments             Tomorrow Thukkani, Arun K, MD [facility] HeartCare at [facility] Dept of [facility] [place] [facility], [facility]             Vitamin D , Ergocalciferol , (DRISDOL ) 1.25 MG (50000 UNIT) CAPS capsule 5 capsule 0    Sig: Take 1 capsule (50,000 Units total) by mouth every 7 (seven) days.     Endocrinology:  Vitamins - Vitamin D  Supplementation 2 Failed - 09/27/2024  3:26 PM      Failed - Manual Review: Route requests for 50,000 IU strength to the provider      Failed - Ca in normal range and within 360 days    Calcium   Date Value Ref Range Status  08/01/2024 8.8 (L) 8.9 - 10.3 mg/dL Final   Calcium , Ion  Date Value Ref Range Status  12/31/2017 1.12 (L) 1.15 - 1.40 mmol/L Final         Failed - Vitamin D  in normal range and within 360 days    Vit D, 25-Hydroxy  Date Value Ref Range Status  07/19/2024 20.66 (L) 30 - 100 ng/mL Final    Comment:    (NOTE) Vitamin D  deficiency has been defined by the Institute of Medicine  and an Endocrine Society practice guideline as a level of serum 25-OH  vitamin D  less than 20 ng/mL (1,2). The Endocrine Society went on to  further define vitamin D  insufficiency as a level between 21 and 29  ng/mL (2).  1. IOM (Institute of Medicine). 2010. Dietary reference intakes for  calcium  and D. [place]: [employer]. 2. Holick MF, B[place], Bischoff-Ferrari HA, et al. Evaluation,  treatment, and prevention of vitamin D  deficiency: an Endocrine  Society clinical practice guideline, JCEM. 2011 Jul; 96(7): 1911-30.  Performed at [facility], [address] [place], [place] [address]          Passed - Valid encounter within last 12 months    Recent Outpatient Visits           6 days ago Encounter to  establish care   [facility] Care at [facility], [place], [place]       Future Appointments             Tomorrow Thukkani, Arun K, MD [facility] HeartCare at [facility] A Dept of [facility] [place]. [facility], H&[facility]

## 2024-09-27 NOTE — Progress Notes (Unsigned)
 Cardiology Office Note:   Date:  09/28/2024  ID:  Cassidy Bruce, DOB Jan 23, 1996, MRN [id number] PCP:  [name], NP  [facility] Providers Cardiologist:  Wendel Haws, MD Referring MD: [name], NP  Chief Complaint/Reason for Referral:  PFO/CVA ASSESSMENT:    1. Cerebrovascular accident (CVA) due to occlusion of left vertebral artery (HCC)   2. PFO (patent foramen ovale)   3. Vertebral artery dissection   4. Hyperlipidemia LDL goal <55       PLAN:   In order of problems listed above: CVA: Evaluation has been negative.  Will await CT CT scan to evaluate vertebral artery dissection prior to potential PFO closure.  Follow-up in 2 months. Continue DAPT with aspirin  81 mg and Plavix  75 mg for a total of 3 months then discontinue Plavix .  Continue rosuvastatin  40 mg PFO: See discussion above Vertebral artery dissection: Discussed with neurology.  Patient is scheduled for repeat CT scan in December.  Will follow-up on this prior to PFO closure. Hyperlipidemia: Given history of stroke goal LDL is less than 55.  Continue rosuvastatin  40 mg, Zetia  10mg             Dispo:  Return in about 2 months (around 11/28/2024).       I spent 31 minutes reviewing all clinical data during and prior to this visit including all relevant imaging studies, laboratories, clinical information from other health systems and prior notes from both Cardiology and other specialties, interviewing the patient, conducting a complete physical examination, and coordinating care in order to formulate a comprehensive and personalized evaluation and treatment plan.   History of Present Illness:    FOCUSED PROBLEM LIST:   Dysarthria/ataxia September 2025 R cerebellar infarction Head CT  Bilateral cerebellar infarctions L>R Head MRI L vertebral dissection Head/neck CTA Not thought to be cause of CVA presentation PFO with bidirectional shunting, EF 60 to 65% TEE [name] grade 3 PFO TCD No DVT LE  dopplers Negative hypercoagulable panel No atrial fibrillation monitor 2025 ROPE score 10 ADHD Depression BMI 26 August 2024:  Patient consents to use of AI scribe. The patient is a 28 year old female with evaluation of problems who presented with an acute stroke last month.  Patient was in her normal state of health up until the day of presentation.  She developed what was initially thought is seizure-like activity, ataxia, and dysarthria.  She was diagnosed with bilateral cerebellar infarctions.  In the course of her workup a TEE demonstrated a small PFO and a TCD was positive.  Her CTA demonstrated no large vessel occlusions but did demonstrate a vertebral artery dissection on the left side with reconstitution.  Was not thought that this finding was related to her presentation given the bilateral infarctions seen.  Her hypercoagulable workup was negative.  She is ultimately discharged to rehabilitation and later discharged home.  She is here to discuss further management of her PFO in the context of a stroke.  She is not currently undergoing speech or physical therapy but plans to seek therapy services now that she has insurance. 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  She reports some residual weakness in her left arm and slight difficulty walking. She is right-handed. Her family notes that she sometimes carries a heavy bag, which exacerbates her symptoms, but she is learning to manage her limitations.  She is currently taking aspirin  and Plavix . No palpitations or issues with her medications since being  discharged.  She lives in [place] with her family, who are actively involved in her care. They are coordinating her visits and ensuring she remains in [place] for her recovery.  Plan:  Refer for monitor.  November 2025:  Patient consents to use of AI scribe. The patient's monitor demonstrated no atrial fibrillation.  She is here to  discuss further.  I did reach out to neurology who suggested that the patient should undergo repeat CT a to evaluate her dissection prior to potential PFO closure.     Current Medications: Current Meds  Medication Sig   acetaminophen  (TYLENOL ) 500 MG tablet Take 500 mg by mouth every 6 (six) hours as needed.   aspirin  EC 81 MG tablet Take 1 tablet (81 mg total) by mouth daily. Swallow whole.   butalbital -acetaminophen -caffeine  (FIORICET ) 50-325-40 MG tablet Take 1 tablet by mouth every 8 (eight) hours as needed for headache (if not effective with tylenol ).   citalopram  (CELEXA ) 20 MG tablet Take 1 tablet (20 mg total) by mouth daily.   ferrous sulfate  325 (65 FE) MG tablet Take 1 tablet (325 mg total) by mouth every other day.   oxyCODONE  (OXY IR/ROXICODONE ) 5 MG immediate release tablet Take 1 tablet (5 mg total) by mouth every 4 (four) hours as needed for breakthrough pain.   Vitamin D , Ergocalciferol , (DRISDOL ) 1.25 MG (50000 UNIT) CAPS capsule Take 1 capsule (50,000 Units total) by mouth every 7 (seven) days.   [DISCONTINUED] clopidogrel  (PLAVIX ) 75 MG tablet Take 1 tablet (75 mg total) by mouth daily.   [DISCONTINUED] ezetimibe  (ZETIA ) 10 MG tablet Take 1 tablet (10 mg total) by mouth daily.   [DISCONTINUED] rosuvastatin  (CRESTOR ) 40 MG tablet Take 1 tablet (40 mg total) by mouth daily.     Review of Systems:   Please see the history of present illness.    All other systems reviewed and are negative.     EKGs/Labs/Other Test Reviewed:   EKG:  2025 NSR  EKG Interpretation Date/Time:    Ventricular Rate:    PR Interval:    QRS Duration:    QT Interval:    QTC Calculation:   R Axis:      Text Interpretation:          CARDIAC STUDIES: Refer to CV Procedures and Imaging Tabs   Risk Assessment/Calculations:          Physical Exam:   VS:  BP 110/70 (BP Location: Right Arm, Patient Position: Sitting, Cuff Size: Normal)   Pulse 80   Ht 5' 4 (1.626 m)   Wt 148 lb 6.4  oz (67.3 kg)   LMP 08/04/2024 (Exact Date)   SpO2 99%   BMI 25.47 kg/m        Wt Readings from Last 3 Encounters:  09/28/24 148 lb 6.4 oz (67.3 kg)  09/20/24 141 lb 6.4 oz (64.1 kg)  09/14/24 141 lb 12.8 oz (64.3 kg)      GENERAL:  No apparent distress, AOx3 HEENT:  No carotid bruits, +2 carotid impulses, no scleral icterus CAR: RRR no murmurs, gallops, rubs, or thrills RES:  Clear to auscultation bilaterally ABD:  Soft, nontender, nondistended, positive bowel sounds x 4 VASC:  +2 radial pulses, +2 carotid pulses NEURO:  CN 2-12 grossly intact; mildly aphasic PSYCH:  No active depression or anxiety EXT:  No edema, ecchymosis, or cyanosis  Signed, Jex Strausbaugh K Aleigh Grunden, MD  09/28/2024 3:01 PM    [facility] [address] [place], [place], [place]  [address] Phone: [phone]; Fax: [phone]   Note:  This document was prepared using Dragon voice recognition software and may include unintentional dictation errors.

## 2024-09-27 NOTE — Telephone Encounter (Signed)
-   Ezetimibe  prescribed (09/26/2024  2:02 PM EST) from Rosemarie Eather RAMAN, MD. - Patient established with [facility]. Request refills from the same.  - Patient established with [facility] Dept of [facility] [employer]. [facility]. Request refills from the same.  - Citalopram , Clopidogrel , and Fioricet  prescribed from Fidela Ned, NP at [facility]. Request refills from the same. - Schedule appointment for Vitamin D  lab.  - Acetaminophen  appears as historical medication. Schedule appointment.  - I am unable to prescribe Oxycodone  per [facility] office policy.  - During the interim report to the Emergency Department/Urgent Care/call 911 for immediate medical evaluation.

## 2024-09-28 ENCOUNTER — Ambulatory Visit: Attending: Internal Medicine | Admitting: Internal Medicine

## 2024-09-28 ENCOUNTER — Encounter: Payer: Self-pay | Admitting: Internal Medicine

## 2024-09-28 ENCOUNTER — Other Ambulatory Visit: Payer: Self-pay

## 2024-09-28 ENCOUNTER — Ambulatory Visit: Admitting: Internal Medicine

## 2024-09-28 ENCOUNTER — Other Ambulatory Visit (HOSPITAL_COMMUNITY): Payer: Self-pay

## 2024-09-28 VITALS — BP 110/70 | HR 80 | Ht 64.0 in | Wt 148.4 lb

## 2024-09-28 DIAGNOSIS — Q2112 Patent foramen ovale: Secondary | ICD-10-CM | POA: Insufficient documentation

## 2024-09-28 DIAGNOSIS — I63212 Cerebral infarction due to unspecified occlusion or stenosis of left vertebral arteries: Secondary | ICD-10-CM | POA: Diagnosis not present

## 2024-09-28 DIAGNOSIS — I7774 Dissection of vertebral artery: Secondary | ICD-10-CM | POA: Insufficient documentation

## 2024-09-28 DIAGNOSIS — E785 Hyperlipidemia, unspecified: Secondary | ICD-10-CM | POA: Diagnosis present

## 2024-09-28 MED ORDER — CLOPIDOGREL BISULFATE 75 MG PO TABS: 75.0000 mg | ORAL_TABLET | Freq: Every day | ORAL | 3 refills | Status: DC

## 2024-09-28 MED ORDER — EZETIMIBE 10 MG PO TABS: 10.0000 mg | ORAL_TABLET | Freq: Every day | ORAL | 3 refills | Status: AC

## 2024-09-28 MED ORDER — ROSUVASTATIN CALCIUM 40 MG PO TABS: 40.0000 mg | ORAL_TABLET | Freq: Every day | ORAL | 3 refills | Status: AC

## 2024-09-28 NOTE — Patient Instructions (Signed)
 Medication Instructions:  No medication changes were made at this visit. Continue current regimen.   *If you need a refill on your cardiac medications before your next appointment, please call your pharmacy*  Lab Work: None ordered today. If you have labs (blood work) drawn today and your tests are completely normal, you will receive your results only by: MyChart Message (if you have MyChart) OR A paper copy in the mail If you have any lab test that is abnormal or we need to change your treatment, we will call you to review the results.  Testing/Procedures: None ordered today.  Follow-Up: At [facility], you and your health needs are our priority.  As part of our continuing mission to provide you with exceptional heart care, our providers are all part of one team.  This team includes your primary Cardiologist (physician) and Advanced Practice Providers or APPs (Physician Assistants and Nurse Practitioners) who all work together to provide you with the care you need, when you need it.  Your next appointment:   2 month(s)  Provider:   Arun Thukkani, MD

## 2024-10-03 ENCOUNTER — Ambulatory Visit

## 2024-10-03 ENCOUNTER — Ambulatory Visit
Admission: RE | Admit: 2024-10-03 | Discharge: 2024-10-03 | Disposition: A | Source: Ambulatory Visit | Attending: Neurology | Admitting: Neurology

## 2024-10-03 MED ORDER — IOPAMIDOL (ISOVUE-370) INJECTION 76%
80.0000 mL | Freq: Once | INTRAVENOUS | Status: AC | PRN
  Administered 2024-10-03: 80 mL via INTRAVENOUS

## 2024-10-03 MED ORDER — CITALOPRAM HYDROBROMIDE 20 MG PO TABS: 20.0000 mg | ORAL_TABLET | Freq: Every day | ORAL | 0 refills | Status: AC

## 2024-10-03 NOTE — Telephone Encounter (Unsigned)
 Copied from CRM [id number]. Topic: Clinical - Medication Refill >> Sep 26, 2024  3:50 PM Shanda MATSU wrote: Medication:  ezetimibe  (ZETIA ) 10 MG tablet  aspirin  EC 81 MG tablet   acetaminophen  (TYLENOL ) 500 MG tablet   butalbital -acetaminophen -caffeine  (FIORICET ) 50-325-40 MG tablet   citalopram  (CELEXA ) 20 MG tablet  clopidogrel  (PLAVIX ) 75 MG tablet  oxyCODONE  (OXY IR/ROXICODONE ) 5 MG immediate release tablet  rosuvastatin  (CRESTOR ) 40 MG tablet  Vitamin D , Ergocalciferol , (DRISDOL ) 1.25 MG (50000 UNIT) CAPS capsule  Has the patient contacted their pharmacy? Yes, referred to provider (Agent: If no, request that the patient contact the pharmacy for the refill. If patient does not wish to contact the pharmacy document the reason why and proceed with request.) (Agent: If yes, when and what did the pharmacy advise?)  This is the patient's preferred pharmacy:  [facility] [phone] - [place], [place] - [address] AT [facility] [address] [place] [address] Phone: [phone] Fax: [phone]  Is this the correct pharmacy for this prescription? Yes If no, delete pharmacy and type the correct one.   Has the prescription been filled recently? No  Is the patient out of the medication? Yes  Has the patient been seen for an appointment in the last year OR does the patient have an upcoming appointment? Yes  Can we respond through MyChart? Yes  Agent: Please be advised that Rx refills may take up to 3 business days. We ask that you follow-up with your pharmacy. >> Oct 03, 2024  3:50 PM Rea ORN wrote: Grayce with [facility] called to request the refill request for citalopram  (CELEXA ) 20 MG tablet be sent as soon as possible for pt. She stated physical medication can not prescribe this and they are asking PCP to order this.

## 2024-10-03 NOTE — Telephone Encounter (Signed)
 Complete

## 2024-10-03 NOTE — Telephone Encounter (Signed)
 Celexa  has been sent in by Greig Chute NP. No other requested meds are prescribed by Amy

## 2024-10-03 NOTE — Telephone Encounter (Signed)
 Citalopram  (Celexa ) MG has been directed to Greig Chute NP. I spoke her office and explained the urgent need for the Rx. Physical Medicine & Rehabilitation will be taking care of the out patient referral therapy, due the stroke & pain management. If they will kindly take care of the maintenance medications it will be  greatly appreciated.

## 2024-10-04 ENCOUNTER — Ambulatory Visit

## 2024-10-06 ENCOUNTER — Ambulatory Visit: Admitting: Physical Therapy

## 2024-10-07 ENCOUNTER — Other Ambulatory Visit: Payer: Self-pay

## 2024-10-07 NOTE — Patient Outreach (Signed)
 Social Drivers of Health  Community Resource and Care Coordination Visit Note   10/07/2024  Name: Cassidy Bruce MRN: [id number] DOB:1995-11-25  Situation: Referral received for SDoH needs assessment and assistance related to Housing  Financial Strain . I obtained verbal consent from Patient.  Visit completed with Patient on the phone.   Background:   SDOH Interventions Today    Flowsheet Row Most Recent Value  SDOH Interventions   Food Insecurity Interventions Intervention Not Indicated  [pt gets food stamps. BSW is working with patient to get fruits and veggies beneifts from [employer].]  Housing Interventions Intervention Not Indicated  [paitent is living with aunt in [place]]  Transportation Interventions Patient Resources (Friends/Family)  [aunt provides transportation. Pt was also explained on how to use medicaid transportation for medical appointments.]  Utilities Interventions Intervention Not Indicated     Assessment:   Goals Addressed             This Visit's Progress    BSW Goal   On track    Current SDOH Barriers:  Financial resource strain Dental/vision provider need  Interventions: Patient interviewed and appropriate screenings performed Referred patient to community resources  Provided patient with information about [employer] mediciad transportation and extra benefits through [facility]. Discussed plans with patient for ongoing follow up and provided patient with direct contact number BSW and pt will call [employer] at next f/u re wellness benefit.  Pt is actively working w/ the servant center for [employer] application.  BSW will provide dental and vision provider list via email.  BSW will in-basket PCP re pod referral.  10/07/2024 BSW and patient called [employer] regarding transportation benefit and confirmed she can travel up to 75 miles one way without pre-authorization approval being needed.  Pt received vision provider list via email but has not called.  BSW and pt called [employer] re  fruits and veggies benefit. Pt will need to call back to complete application for that program.           Recommendation:   attend all scheduled provider appointments call for transportation assistance at least one week before appointments ask for help if you don't understand your health insurance benefits  Follow Up Plan:   Telephone follow up appointment date/time:  10/17/2024 at 2pm  Laymon Doll, [place]/VBCI - [facility] Social Worker [phone]

## 2024-10-07 NOTE — Telephone Encounter (Signed)
 Please refer to my note from  09/27/24  4:18 PM and specify which medications patient needs refills. Thank you. During the interim report to the Emergency Department/Urgent Care/call 911 for immediate medical evaluation.

## 2024-10-07 NOTE — Patient Instructions (Signed)
 Visit Information  Cassidy Bruce was given information about Medicaid Managed Care team care coordination services as a part of their [facility] Community Plan Medicaid benefit.   If you would like to schedule transportation through your [facility], please call the following number at least 2 days in advance of your appointment: [phone]   Rides for urgent appointments can also be made after hours by calling Member Services.  Call the Behavioral Health Crisis Line at [phone], at any time, 24 hours a day, 7 days a week. If you are in danger or need immediate medical attention call 911.  Care plan and visit instructions communicated with the patient verbally today. Patient agrees to receive a copy in MyChart. Active MyChart status and patient understanding of how to access instructions and care plan via MyChart confirmed with patient.     Telephone follow up appointment with Managed Medicaid care management team member scheduled for: 10/17/2024 at 2pm  Laymon Doll, [place]/VBCI - [facility] Social Worker [phone]   Following is a copy of your plan of care:  There are no care plans that you recently modified to display for this patient.

## 2024-10-10 ENCOUNTER — Ambulatory Visit: Admitting: Physical Therapy

## 2024-10-10 ENCOUNTER — Ambulatory Visit

## 2024-10-11 ENCOUNTER — Inpatient Hospital Stay: Payer: Self-pay | Admitting: Neurology

## 2024-10-13 ENCOUNTER — Ambulatory Visit: Payer: Self-pay | Admitting: Neurology

## 2024-10-14 ENCOUNTER — Telehealth: Payer: Self-pay | Admitting: Family

## 2024-10-14 ENCOUNTER — Other Ambulatory Visit: Payer: Self-pay | Admitting: *Deleted

## 2024-10-14 ENCOUNTER — Other Ambulatory Visit: Payer: Self-pay

## 2024-10-14 DIAGNOSIS — I639 Cerebral infarction, unspecified: Secondary | ICD-10-CM

## 2024-10-14 NOTE — Patient Outreach (Signed)
 Complex Care Management   Visit Note  10/14/2024  Name:  Cassidy Bruce MRN: [id number] DOB: 04/26/1996  Situation: Referral received for Complex Care Management related to SDOH Barriers:  Depression Financial stain and CVA I obtained verbal consent from Patient.  Visit completed with Patient  on the phone  Background:   Past Medical History:  Diagnosis Date   ADHD    Depression    Stroke (HCC) 07/05/2024    Assessment: Patient Reported Symptoms:  Cognitive Cognitive Status: Able to follow simple commands, Alert and oriented to person, place, and time, No symptoms reported, Insightful and able to interpret abstract concepts, Normal speech and language skills Cognitive/Intellectual Conditions Management [RPT]: None reported or documented in medical history or problem list   Health Maintenance Behaviors: Annual physical exam, Sleep adequate, Healthy diet, Stress management Healing Pattern: Average Health Facilitated by: Healthy diet, Stress management, Rest, Prayer/meditation  Neurological Neurological Review of Symptoms: No symptoms reported Neurological Management Strategies: Adequate rest, Medication therapy, Routine screening Neurological Self-Management Outcome: 4 (good)  HEENT HEENT Symptoms Reported: No symptoms reported HEENT Management Strategies: Adequate rest, Routine screening HEENT Self-Management Outcome: 4 (good)    Cardiovascular Cardiovascular Symptoms Reported: No symptoms reported Cardiovascular Management Strategies: Adequate rest, Routine screening Cardiovascular Self-Management Outcome: 3 (uncertain)  Respiratory Respiratory Symptoms Reported: Shortness of breath Other Respiratory Symptoms: shortness of breath when Cassidy Bruce takes a long time. Respiratory Management Strategies: Adequate rest, Routine screening Respiratory Self-Management Outcome: 3 (uncertain)  Endocrine Endocrine Symptoms Reported: No symptoms reported Is patient diabetic?: No Endocrine  Self-Management Outcome: 4 (good)  Gastrointestinal Gastrointestinal Symptoms Reported: No symptoms reported Gastrointestinal Management Strategies: Adequate rest Gastrointestinal Self-Management Outcome: 4 (good)    Genitourinary Genitourinary Symptoms Reported: No symptoms reported Genitourinary Management Strategies: Adequate rest Genitourinary Self-Management Outcome: 4 (good)  Integumentary Integumentary Symptoms Reported: No symptoms reported Skin Management Strategies: Coping strategies, Routine screening Skin Self-Management Outcome: 4 (good)  Musculoskeletal Musculoskelatal Symptoms Reviewed: Back pain, Joint pain Additional Musculoskeletal Details: Reports that Cassidy Bruce has occassional back and left shoulder pain.  Cassidy Bruce reports that pain is 0/10 pain scale today. Musculoskeletal Management Strategies: Adequate rest, Activity, Routine screening Musculoskeletal Self-Management Outcome: 3 (uncertain) Falls in the past year?: No Number of falls in past year: 1 or less Was there an injury with Fall?: No Fall Risk Category Calculator: 0 Patient Fall Risk Level: Low Fall Risk Patient at Risk for Falls Due to: No Fall Risks  Psychosocial Psychosocial Symptoms Reported: Depression - if selected complete PHQ 2-9, Anxiety - if selected complete GAD, Sadness - if selected complete PHQ 2-9 Behavioral Management Strategies: Medication therapy, Support system Major Change/Loss/Stressor/Fears (CP): Medical condition, self Quality of Family Relationships: helpful, involved, supportive Do you feel physically threatened by others?: No    10/14/2024    PHQ2-9 Depression Screening   Little interest or pleasure in doing things Not at all  Feeling down, depressed, or hopeless Several days  PHQ-2 - Total Score 1  Trouble falling or staying asleep, or sleeping too much    Feeling tired or having little energy    Poor appetite or overeating     Feeling bad about yourself - or that you are a failure or  have let yourself or your family down    Trouble concentrating on things, such as reading the newspaper or watching television    Moving or speaking so slowly that other people could have noticed.  Or the opposite - being so fidgety or restless that you have been moving around a  lot more than usual    Thoughts that you would be better off dead, or hurting yourself in some way    PHQ2-9 Total Score    If you checked off any problems, how difficult have these problems made it for you to do your work, take care of things at home, or get along with other people    Depression Interventions/Treatment      There were no vitals filed for this visit. Pain Scale: 0-10 Pain Score: 0-No pain  Medications Reviewed Today   Medications were not reviewed in this encounter     Recommendation:   PCP Follow-up Specialty provider follow-up : Rehab-12/16/5 ; Cardiology-11/11/24 Continue Current Plan of Care  Follow Up Plan:   Telephone follow-up in 1 month:11/18/24 @ 1 pm.    Kinnedy Mongiello, RN, BSN, ACM RN Care Manager [employer] [phone]

## 2024-10-14 NOTE — Patient Instructions (Signed)
 Visit Information  Ms. Musil was given information about Medicaid Managed Care team care coordination services as a part of their [facility] Community Plan Medicaid benefit.   If you would like to schedule transportation through your [facility], please call the following number at least 2 days in advance of your appointment: [phone]   Rides for urgent appointments can also be made after hours by calling Member Services.  Call the Behavioral Health Crisis Line at [phone], at any time, 24 hours a day, 7 days a week. If you are in danger or need immediate medical attention call 911.  Please see education materials related to Stroke provided by MyChart link.  Care plan and visit instructions communicated with the patient verbally today. Patient agrees to receive a copy in MyChart. Active MyChart status and patient understanding of how to access instructions and care plan via MyChart confirmed with patient.     Telephone follow up appointment with Managed Medicaid care management team member scheduled for: 11/18/24 @ 1 pm.  Shequila Neglia, RN, BSN, ACM RN Care Manager [facility] [phone]   Following is a copy of your plan of care:   Goals Addressed             This Visit's Progress    VBCI RN Care Plan-CVA       Problems:  Care Coordination needs related to Depression   and Financial Strain  Chronic Disease Management support and education needs related to Depression and CVA.  Goal: Over the next 90 days the Patient will attend all scheduled medical appointments: PCP and Specialist as evidenced by keeping all schedule appointments.        continue to work with Medical Illustrator and/or Social Worker to address care management and care coordination needs related to CVA as evidenced by adherence to care management team scheduled appointments     take all medications exactly as prescribed and will call provider for medication related  questions as evidenced by compliance.    verbalize basic understanding of CVA disease process and self health management plan as evidenced by verbal explanation,  recognize and monitor symptoms and life style changes.   work with child psychotherapist to Sports Coach constraints related to stress of medical condition and Mental Health Concerns  related to the management of CVA as evidenced by review of electronic medical record and patient or social worker report      Interventions:   Evaluation of current treatment plan related to CVA, Financial constraints related to stress of health condition and Mental Health Concerns  self-management and patient's adherence to plan as established by provider. Discussed plans with patient for ongoing care management follow up and provided patient with direct contact information for care management team Evaluation of current treatment plan related to CVA and patient's adherence to plan as established by provider Provided education to patient and/or caregiver about advanced directives Social Work referral for Financial strain and Depression Discussed plans with patient for ongoing care management follow up and provided patient with direct contact information for care management team Screening for signs and symptoms of depression related to chronic disease state  Assessed social determinant of health barriers  Patient Self-Care Activities:  Attend all scheduled provider appointments Attend church or other social activities Call pharmacy for medication refills 3-7 days in advance of running out of medications Call provider office for new concerns or questions  Work with the social worker to address care coordination needs and  will continue to work with the clinical team to address health care and disease management related needs Follow up on resources provided by Social worker Continue to bath and dress yourself as much as possible and ask for assistance when  needed.   Sit upright during meals.   Chew slow and take small bites of food.   Use can for mobility as needed.   Continue to work with PT/OT to improve mobility.  Plan:  Telephone follow up appointment with care management team member scheduled for:  11/18/24 @ 1 pm

## 2024-10-17 ENCOUNTER — Other Ambulatory Visit: Payer: Self-pay

## 2024-10-17 NOTE — Patient Instructions (Signed)
 Visit Information  Ms. Pittinger was given information about Medicaid Managed Care team care coordination services as a part of their [facility] Community Plan Medicaid benefit.   If you would like to schedule transportation through your [facility], please call the following number at least 2 days in advance of your appointment: [phone]   Rides for urgent appointments can also be made after hours by calling Member Services.  Call the Behavioral Health Crisis Line at [phone], at any time, 24 hours a day, 7 days a week. If you are in danger or need immediate medical attention call 911.   Care plan and visit instructions communicated with the patient verbally today. Patient agrees to receive a copy in MyChart. Active MyChart status and patient understanding of how to access instructions and care plan via MyChart confirmed with patient.     Telephone follow up appointment with Managed Medicaid care management team member scheduled for: 11/07/2024 at 2pm.  Laymon Doll, BSW [place]/VBCI - [facility] Social Worker [phone]   Following is a copy of your plan of care:   Goals Addressed             This Visit's Progress    BSW Goal   On track    Current SDOH Barriers:  Financial resource strain Dental/vision provider need  Interventions: Patient interviewed and appropriate screenings performed Referred patient to community resources  Provided patient with information about [facility] mediciad transportation and extra benefits through [facility]. Discussed plans with patient for ongoing follow up and provided patient with direct contact number BSW and pt will call [employer] at next f/u re wellness benefit.  Pt is actively working w/ the servant center for [employer] application.  BSW will provide dental and vision provider list via email.  BSW will in-basket PCP re pod referral.  10/07/2024 BSW and patient called [employer] regarding transportation  benefit and confirmed she can travel up to 75 miles one way without pre-authorization approval being needed.  Pt received vision provider list via email but has not called.  BSW and pt called [employer] re fruits and veggies benefit. Pt will need to call back to complete application for that program.  10/17/2024 Pt applied for fruits and veggies benefits for 6 months through [facility].

## 2024-10-17 NOTE — Patient Outreach (Signed)
 Social Drivers of Health  Community Resource and Care Coordination Visit Note   10/17/2024  Name: Cassidy Bruce MRN: [id number] DOB:May 08, 1996  Situation: Referral received for SDoH needs assessment and assistance related to Housing  Financial Strain . I obtained verbal consent from Patient.  Visit completed with Patient on the phone.   Background:   SDOH Interventions Today    Flowsheet Row Most Recent Value  SDOH Interventions   Food Insecurity Interventions Other (Comment)  [BSW assisted patient in applying for [employer] fruits and veggies benefit for 6 months.]     Assessment:   Goals Addressed             This Visit's Progress    BSW Goal   On track    Current SDOH Barriers:  Financial resource strain Dental/vision provider need  Interventions: Patient interviewed and appropriate screenings performed Referred patient to community resources  Provided patient with information about [employer] mediciad transportation and extra benefits through [facility]. Discussed plans with patient for ongoing follow up and provided patient with direct contact number BSW and pt will call [employer] at next f/u re wellness benefit.  Pt is actively working w/ the servant center for [employer] application.  BSW will provide dental and vision provider list via email.  BSW will in-basket PCP re pod referral.  10/07/2024 BSW and patient called [employer] regarding transportation benefit and confirmed she can travel up to 75 miles one way without pre-authorization approval being needed.  Pt received vision provider list via email but has not called.  BSW and pt called [employer] re fruits and veggies benefit. Pt will need to call back to complete application for that program.  10/17/2024 Pt applied for fruits and veggies benefits for 6 months through [facility].           Recommendation:   attend all scheduled provider appointments call for transportation assistance at least one week before appointments ask for help if you don't  understand your health insurance benefits  Follow Up Plan:   Telephone follow up appointment date/time:  11/08/2023 a t 2pm.   Laymon Doll, BSW [place]/VBCI - [facility] Social Worker [phone]

## 2024-10-18 ENCOUNTER — Ambulatory Visit: Attending: Physical Medicine & Rehabilitation

## 2024-10-18 DIAGNOSIS — R278 Other lack of coordination: Secondary | ICD-10-CM

## 2024-10-18 DIAGNOSIS — R29898 Other symptoms and signs involving the musculoskeletal system: Secondary | ICD-10-CM | POA: Insufficient documentation

## 2024-10-18 DIAGNOSIS — I69354 Hemiplegia and hemiparesis following cerebral infarction affecting left non-dominant side: Secondary | ICD-10-CM | POA: Diagnosis present

## 2024-10-18 DIAGNOSIS — M6281 Muscle weakness (generalized): Secondary | ICD-10-CM | POA: Diagnosis present

## 2024-10-18 DIAGNOSIS — R208 Other disturbances of skin sensation: Secondary | ICD-10-CM | POA: Diagnosis present

## 2024-10-18 DIAGNOSIS — R2681 Unsteadiness on feet: Secondary | ICD-10-CM | POA: Diagnosis present

## 2024-10-18 NOTE — Patient Instructions (Addendum)
   Compensation Strategies for Tremors  When eating, try the following: . Eat out of bowls, divided plates, or use a plate guard (available at a medical supply store) and eat with a spoon so that you have an edge to scoop up food. . Try raising your plate so that there is less distance between the plate and mouth. . Try stabilizing elbows on the table or against your body. . Use utensil with built-up/larger grips as they are easier to hold.  When writing, try the following:   . Stabilize forearm on the table . Take your time as rushing/being stressed can increase tremors. . Try a felt-tipped pen, it does not glide as much.  Avoid gel pens (they move too much). . Consider using pre-printed labels with your name and address (carry them with you when you go out) or you can get stamps with your address or signature on it. . Use a small tape recorder to record messages/reminders for yourself. . Use pens with bigger grips.  When brushing your teeth, putting on make-up, or styling hair, try the following: . Use an electric toothbrush. . Use items with built-up grips. . Stabilize your elbows against your body or on the counter. . Use long-handled brushes/combs. . Use a hair dryer with a stand.  In general: . Avoid stress, fatigue, or rushing as this can increase tremors. . Sit down for activities that require more control/coordination.

## 2024-10-18 NOTE — Therapy (Signed)
 OUTPATIENT OCCUPATIONAL THERAPY NEURO TREATMENT, RE-CERT AND DISCHARGE  Patient Name: Cassidy Bruce MRN: [id number] DOB:14-Oct-1996, 28 y.o., female Today's Date: 10/18/2024  PCP: Cassidy Greig PARAS, NP REFERRING PROVIDER: Carilyn Prentice BRAVO, MD  OCCUPATIONAL THERAPY DISCHARGE SUMMARY  Visits from Start of Care: Including eval pt received 6 visits from 08/19/24-10/18/24.   Current functional level related to goals / functional outcomes:  Pt met 5/5 STG and 3/5 LTG   Remaining deficits: Tremors L hand, some slight LUE FM coordination deficit   Education / Equipment: HEPs for improving strength and coordination, tremor mgmt, purpose of OT.   Patient agrees to discharge. Patient goals were partially met. Patient is being discharged due to being pleased with the current functional level.    END OF SESSION:  OT End of Session - 10/18/24 1609     Visit Number 6    Number of Visits 9    Date for Recertification  10/14/24    Authorization Type [employer] MCD- no auth required    Authorization Time Period VL: [place]    OT Start Time 1402    OT Stop Time 1448    OT Time Calculation (min) 46 min    Equipment Utilized During Treatment testing materials, 3 lb wrist weight    Activity Tolerance Patient tolerated treatment well    Behavior During Therapy WFL for tasks assessed/performed               Past Medical History:  Diagnosis Date   ADHD    Depression    Stroke (HCC) 07/05/2024   Past Surgical History:  Procedure Laterality Date   TRANSESOPHAGEAL ECHOCARDIOGRAM (CATH LAB) N/A 07/07/2024   Procedure: TRANSESOPHAGEAL ECHOCARDIOGRAM;  Surgeon: Lonni Slain, MD;  Location: [facility] INVASIVE CV LAB;  Service: Cardiovascular;  Laterality: N/A;   Patient Active Problem List   Diagnosis Date Noted   Severe episode of recurrent major depressive disorder, without psychotic features (HCC) 07/15/2024   Panic disorder with agoraphobia and moderate panic attacks 07/15/2024    Thromboembolic stroke (HCC) 07/13/2024   PFO (patent foramen ovale) 07/07/2024   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   Hypokalemia 07/06/2024   Stroke (HCC) 07/05/2024   MDD (major depressive disorder), recurrent episode, moderate (HCC) 07/25/2018   Adjustment disorder with mixed anxiety and depressed mood     ONSET DATE: Admit date: 07/13/2024 Discharge date: 08/03/2024  Referral date: 08/16/24  REFERRING DIAG: I63.9 (ICD-10-CM) - Cerebral infarction, unspecified  THERAPY DIAG:  Other lack of coordination  Muscle weakness (generalized)  Hemiplegia and hemiparesis following cerebral infarction affecting left non-dominant side (HCC)  Other symptoms and signs involving the musculoskeletal system  Rationale for Evaluation and Treatment: Rehabilitation  SUBJECTIVE:   SUBJECTIVE STATEMENT: Pt reports things are going well, continues to complete putty and strengthening HEPs as instructed, no questions on any HEPs given.  Pt accompanied by: self, aunt dropped her off.  PERTINENT HISTORY: Presented 07/05/2024 with dizziness slurred speech headache and gait abnormality with left-sided weakness. Per family she had went to the gym on Monday, 07/04/2024 for weightlifting as usual no specific problems. Tuesday morning she awoke from sleep with headache and neck pain and by the afternoon patient with increasing dizziness vertigo as well as slurred speech. Cranial CT scan showed no acute intracranial abnormalities new right cerebellar infarct since prior study of 2021 felt to be possibly chronic. 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 Distal V2 segment reconstitution but with attenuated enhancement relative to the contralateral side along the remainder of its course.  A 13 mm region of ischemia affecting both cerebral hemispheres worse on the left with no more core infarct. MRI identified acute infarcts within the cerebellar vermis and bilateral cerebellar hemispheres. Most notably large  acute infarct present within the superior cerebellar artery territories bilaterally. Posterior fossa mass effect without cerebellar tonsillar herniation or evidence of obstructive hydrocephalus. Petechial hemorrhage within the cerebellar vermis and superior right cerebellar hemisphere.  Other PMH includes: MDD (major depressive disorder), recurrent episode, moderate (HCC)   Severe episode of recurrent major depressive disorder, without psychotic features (HCC)   Panic disorder with agoraphobia and moderate panic attacks PFO Left VA occlusion possible dissection Hyperlipidemia MDD/ADHD Microcytosis  PRECAUTIONS: FallL sided weakness, blood thinners   WEIGHT BEARING RESTRICTIONS: No  PAIN:  Are you having pain? No  FALLS: Has patient fallen in last 6 months? Yes. 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   OBJECTIVE:  Note: Objective measures were completed at Evaluation unless otherwise noted.  HAND DOMINANCE: Right  ADLs: Overall ADLs: Independent Equipment: none  IADLs: Shopping: Haven't attempted yet  Light housekeeping: Independent Meal Prep: Hasn't attempted yet  Community mobility: Not driving but was before stroke, aunt brought her to appt this date. Not cleared to drive at this time. Medication management: Reports doing things pretty fine, though sometimes takes with or without food, does not take at same time consistently, especially for meds in the morning as pt tends to sleep late. Financial management: Reports it is a little stressful d/t not working at this time and being unable to  pay.  Handwriting: 100% legible and Mild micrographia  MOBILITY STATUS: Independent  POSTURE COMMENTS:  No Significant postural limitations Sitting balance: WNL  ACTIVITY TOLERANCE: Activity tolerance: Reports getting a little more winded   FUNCTIONAL OUTCOME MEASURES: Upper Extremity Functional Scale (UEFS): 63/80, functioning at 78.75%, 21.25% impairment  UPPER EXTREMITY ROM:  WNL  Active ROM Right eval Left eval  Shoulder flexion    Shoulder abduction    Shoulder adduction    Shoulder extension    Shoulder internal rotation    Shoulder external rotation    Elbow flexion    Elbow extension    Wrist flexion    Wrist extension    Wrist ulnar deviation    Wrist radial deviation    Wrist pronation    Wrist supination    (Blank rows = not tested)  UPPER EXTREMITY MMT:   WFL,   MMT Right eval Left eval  Shoulder flexion    Shoulder abduction    Shoulder adduction    Shoulder extension    Shoulder internal rotation    Shoulder external rotation    Middle trapezius    Lower trapezius    Elbow flexion  4  Elbow extension  4  Wrist flexion    Wrist extension    Wrist ulnar deviation    Wrist radial deviation    Wrist pronation    Wrist supination    (Blank rows = not tested)  HAND FUNCTION: Grip strength: Right: 50.9 lbs; Left: 27.3 lbs  COORDINATION: 9  Hole Peg test: Right: 44.26 sec; Left: 75.78 sec (Pt was wearing artificial nails) Box and Blocks:  Right 49 blocks, Left 24 blocks  SENSATION: WFL  EDEMA: none  MUSCLE TONE: LUE: Within functional limits  COGNITION: Overall cognitive status: Within functional limits for tasks assessed  VISION: Subjective report: no changes  Baseline vision: Wears glasses for distance only, however cannot find her glasses at this time Visual history: none  VISION ASSESSMENT: To be further assessed in functional context  Patient has difficulty with following activities due to following visual impairments:  can't see well with distance  PERCEPTION: Not tested  PRAXIS: Not tested  OBSERVATIONS: limited grip strength and coordination in affected L hand as well as L shoulder and elbow strength. This is affecting some IADL/ADL tasks as well as ability to return to work.                                                                                                                             TREATMENT DATE: 10/18/24  - Self-care/home management completed for duration as noted below including:   Re-obtained measurements and UEFS, see Goals for updated measurements.  Pt in agreement with d/c at this time, but stated My left arm gets shaky, like I tried to open the door with it and it was like I couldn't quite aim right. Pt educated in tremor mgmt strategies (see pt instructions for handout) and tested 3 lb wrist weight to determine if this would improve GM coordination. Pt able to open door with L hand and pt reported increased ease.   - Therapeutic exercises completed for duration as noted below including:  Pt also educated in wrist isometrics, verbal instruction and visual demo provided in addition to HEP, pt returned demo. See Pt instructions for detailed handout.   PATIENT EDUCATION: Education details: SEE ABOVE Person educated: Patient Education method: Explanation, Demonstration, Tactile cues, Verbal cues, and Handouts Education comprehension: verbalized understanding, returned demonstration, tactile cues required, and needs further education  HOME EXERCISE PROGRAM: FM coordination and putty (08/30/24: [email or web address]) 09/06/24: Access Code: [email or web address] URL: [email or web address] Date: 09/06/2024 Prepared by: Rocky Dutch  Exercises - Seated Elbow Flexion with Self-Anchored Resistance  - 1 x daily - 5 x weekly - 3 sets - 10 reps - Seated Elbow Extension with Self-Anchored Resistance  - 1 x daily - 5 x weekly - 3 sets - 10 reps - Seated Shoulder Horizontal Abduction with  Resistance  - 1 x daily - 5 x weekly - 3 sets - 10 reps - Seated Shoulder Flexion with Self-Anchored Resistance  - 1 x daily - 5 x weekly - 3 sets - 10 reps  Access Code: [email or web address] URL: [email or web address] Date: 10/18/2024  Exercises - Isometric Wrist Extension Pronated  - 1 x daily - 5 x weekly - 3 sets - 5-10 reps - 3-5 hold - Seated Isometric Wrist Flexion Supinated with Manual Resistance  - 1 x daily - 5 x weekly -  3 sets - 5-10 reps - 3-5 hold - Seated Isometric Wrist Ulnar Deviation with Manual Resistance  - 1 x daily - 5 x weekly - 3 sets - 5-10 reps - 3-5 hold - Seated Isometric Wrist Radial Deviation with Manual Resistance  - 1 x daily - 5 x weekly - 3 sets - 5-10 reps - 3-5 hold  GOALS: Goals reviewed with patient? Yes  SHORT TERM GOALS: Target date: 10/18/24  Pt to be independent with HEP for LUE strength and coordination Baseline: New to OP OT Goal status: GOAL MET  2.  Pt will improve LUE elbow strength to 5/5 for improved functional transfers and functional use Baseline: 4/5  09/12/24: 5/5  Goal status: GOAL MET  3.  Pt will demonstrate improved function in LUE by scoring at least 34 on Box and Blocks test  Baseline: Right 49 blocks, Left 24 blocks   09/12/24: 27 blocks 09/26/24: 33 blocks 10/18/24: 37 blocks Goal status: GOAL MET  4.  Pt will demonstrate improved FM coordination in L hand by completing 9 Hole Peg Test in no more than 60 seconds  Baseline: 9 Hole Peg test: Right: 44.26 sec; Left: 75.78 sec 09/12/24: Left: 77 seconds, did drop 2 when taking them out 09/26/24: 73 seconds L hand 10-18-24: 57 seconds L hand Goal status: MET  5.  Pt will increase L grip strength by at least 10 lbs for improved functional use  Baseline: 27.3 lbs L hand 09/06/24: 44.7 lbs L hand Goal status: GOAL MET   LONG TERM GOALS: Target date: 10/18/24  Pt will demonstrate improved functional capability by scoring no less than 71/80 on UEFS Baseline:  63/80 10/18/24: 73/80 Goal status: GOAL MET  2.  Pt will increase L grip strength by at least 20 pounds for improved ability to open jars during meal prep tasks  Baseline: 27.3 lbs L hand 09/06/24: 44.7 lbs L hand 09/26/24: 46.2 L hand 10/18/24: 49.6 Goal status: MET   3.  Pt will complete light meal prep task with no rest breaks  Baseline: New to OP OT, pt reports not attempting since CVA. 10/18/24: Pt reports cooking meals such as spaghetti, eggs, and toast with no concerns. Goal status: GOAL MET  4.  Pt will demonstrate improved bilateral coordination by a score of no less than 30 seconds on each hand on 9 Hole Peg Test  Baseline: 9 Hole Peg test: Right: 44.26 sec; Left: 75.78 sec (pt was wearing artificial nails) 09/12/24: Left: 77 seconds, did drop 2 when taking them out and still was wearing artificial nails.  09/26/24: 73 seconds L hand, 25 seconds R hand  10/18/24: 57 seconds L hand, 30 seconds R hand  Goal status: NOT MET   5. Pt will demonstrate improved function in BUE by scoring at least 45 on Box and Blocks test  Baseline: Right 49 blocks, Left 24 blocks 09/12/24: 27 blocks, L hand 09/26/24: 33 blocks L hand 10/18/24: 57 blocks R hand, 37 blocks L hand Goal status: NOT MET     ASSESSMENT:  CLINICAL IMPRESSION: Patient is appropriate for discharge and no longer demonstrates medical necessity for continued skilled occupational therapy services.      Rocky Dutch, OT 10/18/2024, 4:11 PM

## 2024-10-20 ENCOUNTER — Ambulatory Visit: Admitting: Physical Therapy

## 2024-10-20 ENCOUNTER — Encounter: Payer: Self-pay | Admitting: Physical Therapy

## 2024-10-20 DIAGNOSIS — M6281 Muscle weakness (generalized): Secondary | ICD-10-CM

## 2024-10-20 DIAGNOSIS — R2681 Unsteadiness on feet: Secondary | ICD-10-CM

## 2024-10-20 DIAGNOSIS — R278 Other lack of coordination: Secondary | ICD-10-CM

## 2024-10-20 DIAGNOSIS — I69354 Hemiplegia and hemiparesis following cerebral infarction affecting left non-dominant side: Secondary | ICD-10-CM

## 2024-10-20 DIAGNOSIS — R208 Other disturbances of skin sensation: Secondary | ICD-10-CM

## 2024-10-20 DIAGNOSIS — R29898 Other symptoms and signs involving the musculoskeletal system: Secondary | ICD-10-CM

## 2024-10-20 NOTE — Therapy (Signed)
 OUTPATIENT PHYSICAL THERAPY NEURO TREATMENT   Patient Name: Cassidy Bruce MRN: [id number] DOB:1996-01-06, 28 y.o., female Today's Date: 10/20/2024   PCP: Greig JINNY Drones, NP REFERRING PROVIDER: Carilyn Prentice BRAVO, MD  END OF SESSION:  PT End of Session - 10/20/24 1115     Visit Number 6    Number of Visits 9   8 + eval   Date for Recertification  10/28/24   pushed out due to multi-D scheduling and aquatic needs   Authorization Type [place] [employer]    PT Start Time 1110   pt arrived late   PT Stop Time 1150    PT Time Calculation (min) 40 min    Equipment Utilized During Treatment Other (comment)   aquatic devices as needed for safety and challenge   Activity Tolerance Patient tolerated treatment well    Behavior During Therapy [facility] for tasks assessed/performed          Past Medical History:  Diagnosis Date   ADHD    Depression    Stroke (HCC) 07/05/2024   Past Surgical History:  Procedure Laterality Date   TRANSESOPHAGEAL ECHOCARDIOGRAM (CATH LAB) N/A 07/07/2024   Procedure: TRANSESOPHAGEAL ECHOCARDIOGRAM;  Surgeon: Lonni Slain, MD;  Location: [facility] INVASIVE CV LAB;  Service: Cardiovascular;  Laterality: N/A;   Patient Active Problem List   Diagnosis Date Noted   Severe episode of recurrent major depressive disorder, without psychotic features (HCC) 07/15/2024   Panic disorder with agoraphobia and moderate panic attacks 07/15/2024   Thromboembolic stroke (HCC) 07/13/2024   PFO (patent foramen ovale) 07/07/2024   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   Hypokalemia 07/06/2024   Stroke (HCC) 07/05/2024   MDD (major depressive disorder), recurrent episode, moderate (HCC) 07/25/2018   Adjustment disorder with mixed anxiety and depressed mood     ONSET DATE: 07/05/2024 (CVA)  REFERRING DIAG: I63.9 (ICD-10-CM) - Cerebral infarction, unspecified  THERAPY DIAG:  Other lack of coordination  Muscle weakness (generalized)  Hemiplegia and hemiparesis following  cerebral infarction affecting left non-dominant side (HCC)  Other symptoms and signs involving the musculoskeletal system  Other disturbances of skin sensation  Unsteadiness on feet  Rationale for Evaluation and Treatment: Rehabilitation  SUBJECTIVE:                                                                                                                                                                                             SUBJECTIVE STATEMENT: Pt presents ambulatory modified independently, using standalone cane.  She reports recently being out of town due to family needs.  She just returned and reports everything has been going well. Pt accompanied by: family member (  Aunt)  PERTINENT HISTORY: MDD, CVA, PFO, panic disorder w/ agoraphobia and moderate panic attacks  Per inpt rehab discharge summary: Presented 07/05/2024 with dizziness slurred speech headache and gait abnormality with left-sided weakness. Per family she had went to the gym on Monday, 07/04/2024 for weightlifting as usual no specific problems. Tuesday morning she awoke from sleep with headache and neck pain and by the afternoon patient with increasing dizziness vertigo as well as slurred speech. Cranial CT scan showed no acute intracranial abnormalities new right cerebellar infarct since prior study of 2021 felt to be possibly chronic. 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 Distal V2 segment reconstitution but with attenuated enhancement relative to the contralateral side along the remainder of its course. A 13 mm region of ischemia affecting both cerebral hemispheres worse on the left with no more core infarct. MRI identified acute infarcts within the cerebellar vermis and bilateral cerebellar hemispheres. Most notably large acute infarct present within the superior cerebellar artery territories bilaterally. Posterior fossa mass effect without cerebellar tonsillar herniation or evidence of obstructive  hydrocephalus. Petechial hemorrhage within the cerebellar vermis and superior right cerebellar hemisphere. Patient did not receive TNK. No thrombectomy needed for left VA occlusion as BA was patent.  PAIN:  Are you having pain? No  PRECAUTIONS: Fall and Other: Zio heart monitor  RED FLAGS: None   WEIGHT BEARING RESTRICTIONS: No  FALLS: Has patient fallen in last 6 months? No and day of CVA she slid to the floor to lay down  LIVING ENVIRONMENT: Lives with: lives alone Lives in: Transitional housing - hotel Stairs: No - mostly uses elevator but will walk down the stairs Has following equipment at home: shower chair and elevator and triangle rollator  PLOF: Independent - she has been trying to navigate crowds (went to [employer] and [employer])  PATIENT GOALS: my balance  OBJECTIVE:  Note: Objective measures were completed at Evaluation unless otherwise noted.  DIAGNOSTIC FINDINGS:  Brain MRI 07/11/2024: IMPRESSION: 1. Evolving early subacute bilateral cerebellar infarcts, left slightly worse than right. Possible mild interval expansion versus changes of Wallerian degeneration (favored) involving the midbrain since previous. Associated petechial hemorrhage without frank hemorrhagic transformation. 2. Loss of normal flow void within the left vertebral artery, consistent with previously identified dissection. 3. Otherwise stable and normal brain MRI.  COGNITION: Overall cognitive status: Impaired and delayed processing and speech impairments/slurred speech   SENSATION: Light touch: WFL  COORDINATION: BLE RAMS:  LLE fatigues and slows with prolonged task Heel-to-shin:  mildly dysmetric LLE  EDEMA:  None noted in BLE  MUSCLE TONE: None noted in LLE  POSTURE: forward head - very mild  LOWER EXTREMITY ROM:     Active  Right Eval Left Eval  Hip flexion Grossly WNL  Hip extension   Hip abduction   Hip adduction   Hip internal rotation   Hip external rotation    Knee flexion   Knee extension   Ankle dorsiflexion   Ankle plantarflexion    Ankle inversion    Ankle eversion     (Blank rows = not tested)  LOWER EXTREMITY MMT:    MMT Right Eval Left Eval  Hip flexion 5 4+  Hip extension    Hip abduction 4+ 4  Hip adduction    Hip internal rotation    Hip external rotation    Knee flexion 5 4+  Knee extension 5 5  Ankle dorsiflexion 5 5  Ankle plantarflexion    Ankle inversion    Ankle eversion    (  Blank rows = not tested)  BED MOBILITY:  Findings: Sit to supine Complete Independence Supine to sit Complete Independence Rolling to Right Complete Independence Rolling to Left Complete Independence  TRANSFERS: Sit to stand: Complete Independence  Assistive device utilized: None     Stand to sit: Complete Independence  Assistive device utilized: None     Chair to chair: SBA  Assistive device utilized: None       RAMP:  Not tested  CURB:  Not tested  STAIRS: Not tested GAIT: Findings: Gait Characteristics: step through pattern, decreased arm swing- Left, decreased stride length, and genu recurvatum- Left, Distance walked: various clinic distances, Assistive device utilized:None, Level of assistance: SBA and CGA, and Comments: mild lateral left knee thrust in stance  FUNCTIONAL TESTS:  5 times sit to stand: 14.54 sec no UE support 10 meter walk test: 12.94 sec no AD CGA = 0.77 m/sec OR 2.55 ft/sec Functional gait assessment:  FUNCTIONAL GAIT ASSESSMENT  Date: 08/19/2024 Score  GAIT LEVEL SURFACE Instructions: Walk at your normal speed from here to the next mark (6 m) [20 ft]. (2) Mild impairment - Walks 6 m (20 ft) in less than 7 seconds but greater than 5.5 seconds, uses assistive device, slower speed, mild gait deviations, or deviates 15.24 -25.4 cm (6 -10 in) outside of the 30.48-cm (12-in) walkway width.  2.   CHANGE IN GAIT SPEED Instructions: Begin walking at your normal pace (for 1.5 m [5 ft]). When I tell you go, walk  as fast as you can (for 1.5 m [5 ft]). When I tell you slow, walk as slowly as you can (for 1.5 m [5 ft]. (3) Normal - Able to smoothly change walking speed without loss of balance or gait deviation. Shows a significant difference in walking speeds between normal, fast, and slow speeds. Deviates no more than 15.24 cm (6 in) outside of the 30.48-cm (12-in) walkway width.  3.    GAIT WITH HORIZONTAL HEAD TURNS Instructions: Walk from here to the next mark 6 m (20 ft) away. Begin walking at your normal pace. Keep walking straight; after 3 steps, turn your head to the right and keep walking straight while looking to the right. After 3 more steps, turn your head to the left and keep walking straight while looking left. Continue alternating looking right and left. (2) Mild impairment - Performs head turns smoothly with slight change in gait velocity (eg, minor disruption to smooth gait path), deviates 15.24 -25.4 cm (6 -10 in) outside 30.48-cm (12-in) walkway width, or uses an assistive device.  4.   GAIT WITH VERTICAL HEAD TURNS Instructions: Walk from here to the next mark (6 m [20 ft]). Begin walking at your normal pace. Keep walking straight; after 3 steps, tip your head up and keep walking straight while looking up. After 3 more steps, tip your head down, keep walking straight while looking down. Continue  alternating looking up and down every 3 steps until you have completed 2 repetitions in each direction. (3) Normal - Performs head turns with no change in gait. Deviates no more than 15.24 cm (6 in) outside 30.48-cm (12-in) walkway width.  5.  GAIT AND PIVOT TURN Instructions: Begin with walking at your normal pace. When I tell you, turn and stop, turn as quickly as you can to face the opposite direction and stop. (1) Moderate impairment - Turns slowly, requires verbal cueing, or requires several small steps to catch balance following turn and stop  6.  STEP OVER OBSTACLE Instructions: Begin walking at  your normal speed. When you come to the shoe box, step over it, not around it, and keep walking. (1) Moderate impairment - Is able to step over one shoe box (11.43 cm [4.5 in] total height) but must slow down and adjust steps to clear box safely. May require verbal cueing.  7.   GAIT WITH NARROW BASE OF SUPPORT Instructions: Walk on the floor with arms folded across the chest, feet aligned heel to toe in tandem for a distance of 3.6 m [12 ft]. The number of steps taken in a straight line are counted for a maximum of 10 steps. (2) Mild impairment - Ambulates 7-9 steps  8.   GAIT WITH EYES CLOSED Instructions: Walk at your normal speed from here to the next mark (6 m [20 ft]) with your eyes closed. (2) Mild impairment - Walks 6 m (20 ft), uses assistive device, slower speed, mild gait deviations, deviates 15.24 -25.4 cm (6 -10 in) outside 30.48-cm (12-in) walkway width. Ambulates 6 m (20 ft) in less than 9 seconds but greater than 7 seconds  9.   AMBULATING BACKWARDS Instructions: Walk backwards until I tell you to stop (2) Mild impairment - Walks 6 m (20 ft), uses assistive device, slower speed, mild gait deviations, deviates 15.24 -25.4 cm (6 -10 in) outside 30.48-cm (12-in) walkway width  10. STEPS Instructions: Walk up these stairs as you would at home (ie, using the rail if necessary). At the top turn around and walk down. (3) Normal-Alternating feet, no rail.  Total 21/30   Interpretation of scores: Non-Specific Older Adults Cutoff Score: <=22/30 = risk of falls Parkinsons Disease Cutoff score <15/30= fall risk (Hoehn & Yahr 1-4)  Minimally Clinically Important Difference (MCID)  Stroke (acute, subacute, and chronic) = MDC: 4.2 points Vestibular (acute) = MDC: 6 points Community Dwelling Older Adults =  MCID: 4 points Parkinsons Disease  =  MDC: 4.3 points  (Academy of Neurologic Physical Therapy (nd). Functional Gait Assessment. Retrieved from  https://www.neuropt.org/docs/default-source/cpgs/core-outcome-measures/function-gait-assessment-pocket-guide-proof9-(2).pdf?sfvrsn=b77f35043_0.)  PATIENT SURVEYS:  None relevant to chief complaint and age range.                                                                                                                              TREATMENT DATE: 10/20/2024  Aquatic therapy at [facility] - pool temperature 90 degrees   Patient seen for aquatic therapy today.  Treatment took place in water  3.6-4.8 feet deep depending upon activity.  Patient entered and exited the pool via stairs reciprocally using unilateral rail at mod I level.   Exercises: -Water  walking warmup:  forward > backward > laterally 4x18 ft unsupported working on large steps and symmetry of stepping (some mild lateral sway w/ forward and backward large steps)  -Forward lunges x10 each LE > walking lunges 4x18 ft -6 offset squats 2x10 each LE w/ BUE support -Lunges from 6 step x10 each side using BUE support -  Tandem walking 4x18 ft unsupported; moderate sway and use of stepping strategy w/ repeated LOB  Patient requires buoyancy of the water  for support for reduced fall risk with gait training and balance exercises with SBA support, return demo, and min cues for form. Exercises able to be performed safely in water  without the risk of fall compared to those same exercises performed on land; viscosity of water  needed for resistance for strengthening. Current of water  provides perturbations for challenging static and dynamic balance.   PATIENT EDUCATION: Education details: Continue HEP.  Scheduled follow-up land appt to address goals and determine re-cert frequency due to recent missed appts.  Discussed AFO vs knee cage - pt more interested in AFO despite ankle mobility/strength - educated on possibly not recommending this due to regained ankle mobility and difficulty ideally controlling knee hyperextension but will reassess.  Pt  does not have access to pool so will not provide aquatic HEP, but will continue modality to supplement land and work on ai chi for balance. Person educated: Patient Education method: Explanation, Demonstration, Verbal cues, and Handouts Education comprehension: verbalized understanding and needs further education  HOME EXERCISE PROGRAM: Access Code: [email or web address] URL: [email or web address] Date: 08/19/2024 Prepared by: Daved Bull  Exercises - Seated Hamstring Curls with Resistance  - 1 x daily - 7 x weekly - 2 sets - 10 reps - Standing Hamstring Curl with Resistance  - 1 x daily - 7 x weekly - 2 sets - 10 reps - Squat with Chair Touch and Resistance Loop  - 1 x daily - 5 x weekly - 2 sets - 12 reps - Side Stepping with Resistance at Thighs  - 1 x daily - 5 x weekly - 3 sets - 10 reps - Modified Single-Leg Deadlift  - 1 x daily - 5 x weekly - 2 sets - 10 reps - Walking Step Over  - 1 x daily - 5 x weekly - 3 sets - 10 reps  GOALS: Goals reviewed with patient? Yes  SHORT TERM GOALS: Target date: 09/16/2024  Pt will be independent and compliant with introductory strength and balance focused HEP in order to maintain functional progress and improve mobility. Baseline:  Established on eval; progressed 11/24 Goal status: IN PROGRESS  2.  Pt will be assessed for most appropriate hyperextension management w/ edu on appropriate wear or use. Baseline: Discussed bracing vs taping options on eval; holding on knee cage as pt thinks it over (11/24) Goal status: IN PROGRESS  3.  Pt will initiate aquatic therapy for improved balance management and strengthening. Baseline: To be scheduled; pt actively in aquatics (11/24) Goal status: MET  4.  Pt will decrease 5xSTS to </=12 seconds w/o UE support in order to demonstrate decreased risk for falls and improved functional bilateral LE strength and power. Baseline: 14.54 sec no UE support; 10.59 sec no UE support (11/24) Goal status:  MET  5.  Pt will demonstrate a gait speed of >/=2.75 feet/sec in order to decrease risk for falls. Baseline: 2.55 ft/sec no AD SBA; 3.33 ft/sec no AD SBA (11/24) Goal status: MET  LONG TERM GOALS: Target date: 10/14/2024  Pt will be independent and compliant with advanced and finalized strength and balance focused HEP in order to maintain functional progress and improve mobility. Baseline: Established on eval Goal status: INITIAL  2.  Pt will improve FGA score to >/=26/30 in order to demonstrate improved balance and decreased fall risk. Baseline: 21/30 Goal status: INITIAL  3.  Pt will demonstrate a  gait speed of >/=3.53 feet/sec in order to decrease risk for falls. Baseline: 2.55 ft/sec no AD SBA; 3.33 ft/sec no AD SBA (11/24) Goal status: REVISED  4.  Pt will ambulate >/=500 feet without AD independently over level and unlevel surfaces using appropriate knee control in order to promote household and community access. Baseline: ambulates w/ knee hyperextension no AD limited community distances SBA-CGA Goal status: INITIAL  ASSESSMENT:  CLINICAL IMPRESSION: Pt seen for aquatic visit today following recent missed visits due to being out-of-town.  Her knee hyperextension continues to appear improved and she is relying on cane less.  She has some ongoing mild lateral sway with initial steps in most directions (particularly forward and retro today), but compensates well and uses successful ankle and stepping strategies as appropriate.  Her BLE strength appears improving, but would likely benefit from further strengthening in this setting.  Will not establish aquatic HEP due to lack of pool access but will review ai chi for balance.  Will continue to address dynamic balance to improve upright mobility and functional independence. Continue per POC.    OBJECTIVE IMPAIRMENTS: Abnormal gait, decreased activity tolerance, decreased balance, decreased coordination, decreased knowledge of use of  DME, decreased strength, and improper body mechanics.   ACTIVITY LIMITATIONS: lifting, standing, squatting, transfers, and locomotion level  PARTICIPATION LIMITATIONS: driving, shopping, community activity, and occupation  PERSONAL FACTORS: Past/current experiences and 1-2 comorbidities: MDD/panic disorder are also affecting patient's functional outcome.   REHAB POTENTIAL: Excellent  CLINICAL DECISION MAKING: Evolving/moderate complexity  EVALUATION COMPLEXITY: Moderate  PLAN:  PT FREQUENCY: 1x/week  PT DURATION: 8 weeks  PLANNED INTERVENTIONS: [id number]- PT Re-evaluation, 97750- Physical Performance Testing, 97110-Therapeutic exercises, 97530- Therapeutic activity, [id number]- Neuromuscular re-education, 97535- Self Care, [id number]- Manual therapy, [id number]- Gait training, [phone]- Orthotic Initial, [phone]- Orthotic/Prosthetic subsequent, [phone]- Aquatic Therapy, [phone]- Electrical stimulation (manual), Patient/Family education, Balance training, Stair training, Taping, Joint mobilization, Vestibular training, and DME instructions  PLAN FOR NEXT SESSION: Expand HEP prn, SLS and high level stability, treadmill training, step ups and heel taps; pursue knee cage order at re-cert if pt desires - pt interested in AFO on 12/18 reporting it helps her balance more - will reassess.  ON LAND 12/23 ASSESS LTGs - re-cert?  Aquatics:  Walking mechanics, SL elevated STS, core; NO AQUATIC HEP - no pool access;  Ai Chi  Check all possible CPT codes: See Planned Interventions List for Planned CPT Codes    Check all conditions that are expected to impact treatment: Neurological condition and/or seizures, Psychological or psychiatric disorders, and Social determinants of health   If treatment provided at initial evaluation, no treatment charged due to lack of authorization.    Daved KATHEE Bull, PT, DPT 10/20/2024, 11:50 AM

## 2024-10-21 ENCOUNTER — Encounter: Admitting: Physical Medicine & Rehabilitation

## 2024-10-25 ENCOUNTER — Encounter: Payer: Self-pay | Admitting: Physical Therapy

## 2024-10-25 ENCOUNTER — Ambulatory Visit: Admitting: Physical Therapy

## 2024-10-25 ENCOUNTER — Encounter: Payer: Self-pay | Admitting: Physical Medicine & Rehabilitation

## 2024-10-25 ENCOUNTER — Encounter: Attending: Physical Medicine & Rehabilitation | Admitting: Physical Medicine & Rehabilitation

## 2024-10-25 VITALS — BP 118/73 | HR 85 | Ht 64.0 in | Wt <= 1120 oz

## 2024-10-25 DIAGNOSIS — I69398 Other sequelae of cerebral infarction: Secondary | ICD-10-CM | POA: Diagnosis present

## 2024-10-25 DIAGNOSIS — R2681 Unsteadiness on feet: Secondary | ICD-10-CM

## 2024-10-25 DIAGNOSIS — R278 Other lack of coordination: Secondary | ICD-10-CM

## 2024-10-25 DIAGNOSIS — I639 Cerebral infarction, unspecified: Secondary | ICD-10-CM | POA: Insufficient documentation

## 2024-10-25 DIAGNOSIS — R269 Unspecified abnormalities of gait and mobility: Secondary | ICD-10-CM | POA: Insufficient documentation

## 2024-10-25 DIAGNOSIS — I69354 Hemiplegia and hemiparesis following cerebral infarction affecting left non-dominant side: Secondary | ICD-10-CM

## 2024-10-25 DIAGNOSIS — I69393 Ataxia following cerebral infarction: Secondary | ICD-10-CM | POA: Insufficient documentation

## 2024-10-25 DIAGNOSIS — M6281 Muscle weakness (generalized): Secondary | ICD-10-CM

## 2024-10-25 DIAGNOSIS — R29898 Other symptoms and signs involving the musculoskeletal system: Secondary | ICD-10-CM

## 2024-10-25 DIAGNOSIS — R208 Other disturbances of skin sensation: Secondary | ICD-10-CM

## 2024-10-25 NOTE — Patient Instructions (Signed)
" °  VISIT SUMMARY: Today, we reviewed your progress following your bilateral cerebellar infarcts. You are continuing with physical and aquatic therapy, and you have completed occupational therapy. You reported mild memory issues under stress but are managing well with lists. You declined speech therapy and a neuropsychological evaluation for now.  YOUR PLAN: FUNCTIONAL DEFICITS SECONDARY TO BILATERAL CEREBELLAR INFARCTS: You have mild coordination issues and forgetfulness under stress. You are actively participating in physical and aquatic therapy. -Continue physical and aquatic therapy through early February. -You declined speech therapy at this time. -Consider a neuropsychological evaluation if your cognitive symptoms worsen. -Follow-up after your therapy block in six weeks.  GLUTEAL MUSCLE WEAKNESS: You have persistent weakness in your gluteal muscles post-stroke, but you are making progress with therapy. -Continue physical therapy with alternating land-based and aquatic sessions, increasing to twice weekly in the new year. -Your lower extremity strength and gait have improved, and you are using your cane less often.                      Contains text generated by [facility].                                 Contains text generated by [facility].   "

## 2024-10-25 NOTE — Addendum Note (Signed)
 Addended by: ULYSES SENSOR B on: 10/25/2024 02:54 PM   Modules accepted: Orders

## 2024-10-25 NOTE — Therapy (Addendum)
 " OUTPATIENT PHYSICAL THERAPY NEURO TREATMENT - RECERTIFICATION   Patient Name: Cassidy Bruce MRN: [id number] DOB:Jan 08, 1996, 28 y.o., female Today's Date: 10/25/2024   PCP: Greig JINNY Drones, NP REFERRING PROVIDER: Carilyn Prentice BRAVO, MD  END OF SESSION:  PT End of Session - 10/25/24 1357     Visit Number 7    Number of Visits 17   9 + 8   Date for Recertification  12/16/24   pushed out due to multi-D scheduling and aquatic needs/holiday scheduling   Authorization Type [place] Medicaid    PT Start Time 1353    PT Stop Time 1431    PT Time Calculation (min) 38 min    Equipment Utilized During Treatment Gait belt    Activity Tolerance Patient tolerated treatment well    Behavior During Therapy [facility] for tasks assessed/performed          Past Medical History:  Diagnosis Date   ADHD    Depression    Stroke (HCC) 07/05/2024   Past Surgical History:  Procedure Laterality Date   TRANSESOPHAGEAL ECHOCARDIOGRAM (CATH LAB) N/A 07/07/2024   Procedure: TRANSESOPHAGEAL ECHOCARDIOGRAM;  Surgeon: Lonni Slain, MD;  Location: [facility] INVASIVE CV LAB;  Service: Cardiovascular;  Laterality: N/A;   Patient Active Problem List   Diagnosis Date Noted   Severe episode of recurrent major depressive disorder, without psychotic features (HCC) 07/15/2024   Panic disorder with agoraphobia and moderate panic attacks 07/15/2024   Thromboembolic stroke (HCC) 07/13/2024   PFO (patent foramen ovale) 07/07/2024   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   Hypokalemia 07/06/2024   Stroke (HCC) 07/05/2024   MDD (major depressive disorder), recurrent episode, moderate (HCC) 07/25/2018   Adjustment disorder with mixed anxiety and depressed mood     ONSET DATE: 07/05/2024 (CVA)  REFERRING DIAG: I63.9 (ICD-10-CM) - Cerebral infarction, unspecified  THERAPY DIAG:  Other lack of coordination - Plan: PT plan of care cert/re-cert  Muscle weakness (generalized) - Plan: PT plan of care  cert/re-cert  Hemiplegia and hemiparesis following cerebral infarction affecting left non-dominant side (HCC) - Plan: PT plan of care cert/re-cert  Other symptoms and signs involving the musculoskeletal system - Plan: PT plan of care cert/re-cert  Other disturbances of skin sensation - Plan: PT plan of care cert/re-cert  Unsteadiness on feet - Plan: PT plan of care cert/re-cert  Rationale for Evaluation and Treatment: Rehabilitation  SUBJECTIVE:                                                                                                                                                                                             SUBJECTIVE STATEMENT: Pt presents ambulatory  modified independently, using standalone cane.  No falls or acute changes, she reports she will be stopping a medication (cannot recall name) in January per neurologist instructions. Pt accompanied by: family member Midwife)  PERTINENT HISTORY: MDD, CVA, PFO, panic disorder w/ agoraphobia and moderate panic attacks  Per inpt rehab discharge summary: Presented 07/05/2024 with dizziness slurred speech headache and gait abnormality with left-sided weakness. Per family she had went to the gym on Monday, 07/04/2024 for weightlifting as usual no specific problems. Tuesday morning she awoke from sleep with headache and neck pain and by the afternoon patient with increasing dizziness vertigo as well as slurred speech. Cranial CT scan showed no acute intracranial abnormalities new right cerebellar infarct since prior study of 2021 felt to be possibly chronic. 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 Distal V2 segment reconstitution but with attenuated enhancement relative to the contralateral side along the remainder of its course. A 13 mm region of ischemia affecting both cerebral hemispheres worse on the left with no more core infarct. MRI identified acute infarcts within the cerebellar vermis and bilateral cerebellar  hemispheres. Most notably large acute infarct present within the superior cerebellar artery territories bilaterally. Posterior fossa mass effect without cerebellar tonsillar herniation or evidence of obstructive hydrocephalus. Petechial hemorrhage within the cerebellar vermis and superior right cerebellar hemisphere. Patient did not receive TNK. No thrombectomy needed for left VA occlusion as BA was patent.  PAIN:  Are you having pain? No  PRECAUTIONS: Fall and Other: Zio heart monitor  RED FLAGS: None   WEIGHT BEARING RESTRICTIONS: No  FALLS: Has patient fallen in last 6 months? No and day of CVA she slid to the floor to lay down  LIVING ENVIRONMENT: Lives with: lives alone Lives in: Transitional housing - hotel Stairs: No - mostly uses elevator but will walk down the stairs Has following equipment at home: shower chair and elevator and triangle rollator  PLOF: Independent - she has been trying to navigate crowds (went to [employer] and home[employer])  PATIENT GOALS: my balance  OBJECTIVE:  Note: Objective measures were completed at Evaluation unless otherwise noted.  DIAGNOSTIC FINDINGS:  Brain MRI 07/11/2024: IMPRESSION: 1. Evolving early subacute bilateral cerebellar infarcts, left slightly worse than right. Possible mild interval expansion versus changes of Wallerian degeneration (favored) involving the midbrain since previous. Associated petechial hemorrhage without frank hemorrhagic transformation. 2. Loss of normal flow void within the left vertebral artery, consistent with previously identified dissection. 3. Otherwise stable and normal brain MRI.  COGNITION: Overall cognitive status: Impaired and delayed processing and speech impairments/slurred speech   SENSATION: Light touch: WFL  COORDINATION: BLE RAMS:  LLE fatigues and slows with prolonged task Heel-to-shin:  mildly dysmetric LLE  EDEMA:  None noted in BLE  MUSCLE TONE: None noted in LLE  POSTURE:  forward head - very mild  LOWER EXTREMITY ROM:     Active  Right Eval Left Eval  Hip flexion Grossly WNL  Hip extension   Hip abduction   Hip adduction   Hip internal rotation   Hip external rotation   Knee flexion   Knee extension   Ankle dorsiflexion   Ankle plantarflexion    Ankle inversion    Ankle eversion     (Blank rows = not tested)  LOWER EXTREMITY MMT:    MMT Right Eval Left Eval  Hip flexion 5 4+  Hip extension    Hip abduction 4+ 4  Hip adduction    Hip internal rotation    Hip  external rotation    Knee flexion 5 4+  Knee extension 5 5  Ankle dorsiflexion 5 5  Ankle plantarflexion    Ankle inversion    Ankle eversion    (Blank rows = not tested)  BED MOBILITY:  Findings: Sit to supine Complete Independence Supine to sit Complete Independence Rolling to Right Complete Independence Rolling to Left Complete Independence  TRANSFERS: Sit to stand: Complete Independence  Assistive device utilized: None     Stand to sit: Complete Independence  Assistive device utilized: None     Chair to chair: SBA  Assistive device utilized: None       RAMP:  Not tested  CURB:  Not tested  STAIRS: Not tested GAIT: Findings: Gait Characteristics: step through pattern, decreased arm swing- Left, decreased stride length, and genu recurvatum- Left, Distance walked: various clinic distances, Assistive device utilized:None, Level of assistance: SBA and CGA, and Comments: mild lateral left knee thrust in stance  FUNCTIONAL TESTS:  5 times sit to stand: 14.54 sec no UE support 10 meter walk test: 12.94 sec no AD CGA = 0.77 m/sec OR 2.55 ft/sec Functional gait assessment:  FUNCTIONAL GAIT ASSESSMENT  Date: 08/19/2024 Score  GAIT LEVEL SURFACE Instructions: Walk at your normal speed from here to the next mark (6 m) [20 ft]. (2) Mild impairment - Walks 6 m (20 ft) in less than 7 seconds but greater than 5.5 seconds, uses assistive device, slower speed, mild gait  deviations, or deviates 15.24 -25.4 cm (6 -10 in) outside of the 30.48-cm (12-in) walkway width.  2.   CHANGE IN GAIT SPEED Instructions: Begin walking at your normal pace (for 1.5 m [5 ft]). When I tell you go, walk as fast as you can (for 1.5 m [5 ft]). When I tell you slow, walk as slowly as you can (for 1.5 m [5 ft]. (3) Normal - Able to smoothly change walking speed without loss of balance or gait deviation. Shows a significant difference in walking speeds between normal, fast, and slow speeds. Deviates no more than 15.24 cm (6 in) outside of the 30.48-cm (12-in) walkway width.  3.    GAIT WITH HORIZONTAL HEAD TURNS Instructions: Walk from here to the next mark 6 m (20 ft) away. Begin walking at your normal pace. Keep walking straight; after 3 steps, turn your head to the right and keep walking straight while looking to the right. After 3 more steps, turn your head to the left and keep walking straight while looking left. Continue alternating looking right and left. (2) Mild impairment - Performs head turns smoothly with slight change in gait velocity (eg, minor disruption to smooth gait path), deviates 15.24 -25.4 cm (6 -10 in) outside 30.48-cm (12-in) walkway width, or uses an assistive device.  4.   GAIT WITH VERTICAL HEAD TURNS Instructions: Walk from here to the next mark (6 m [20 ft]). Begin walking at your normal pace. Keep walking straight; after 3 steps, tip your head up and keep walking straight while looking up. After 3 more steps, tip your head down, keep walking straight while looking down. Continue  alternating looking up and down every 3 steps until you have completed 2 repetitions in each direction. (3) Normal - Performs head turns with no change in gait. Deviates no more than 15.24 cm (6 in) outside 30.48-cm (12-in) walkway width.  5.  GAIT AND PIVOT TURN Instructions: Begin with walking at your normal pace. When I tell you, turn and stop, turn as quickly  as you can to face the  opposite direction and stop. (1) Moderate impairment - Turns slowly, requires verbal cueing, or requires several small steps to catch balance following turn and stop  6.   STEP OVER OBSTACLE Instructions: Begin walking at your normal speed. When you come to the shoe box, step over it, not around it, and keep walking. (1) Moderate impairment - Is able to step over one shoe box (11.43 cm [4.5 in] total height) but must slow down and adjust steps to clear box safely. May require verbal cueing.  7.   GAIT WITH NARROW BASE OF SUPPORT Instructions: Walk on the floor with arms folded across the chest, feet aligned heel to toe in tandem for a distance of 3.6 m [12 ft]. The number of steps taken in a straight line are counted for a maximum of 10 steps. (2) Mild impairment - Ambulates 7-9 steps  8.   GAIT WITH EYES CLOSED Instructions: Walk at your normal speed from here to the next mark (6 m [20 ft]) with your eyes closed. (2) Mild impairment - Walks 6 m (20 ft), uses assistive device, slower speed, mild gait deviations, deviates 15.24 -25.4 cm (6 -10 in) outside 30.48-cm (12-in) walkway width. Ambulates 6 m (20 ft) in less than 9 seconds but greater than 7 seconds  9.   AMBULATING BACKWARDS Instructions: Walk backwards until I tell you to stop (2) Mild impairment - Walks 6 m (20 ft), uses assistive device, slower speed, mild gait deviations, deviates 15.24 -25.4 cm (6 -10 in) outside 30.48-cm (12-in) walkway width  10. STEPS Instructions: Walk up these stairs as you would at home (ie, using the rail if necessary). At the top turn around and walk down. (3) Normal-Alternating feet, no rail.  Total 21/30   Interpretation of scores: Non-Specific Older Adults Cutoff Score: <=22/30 = risk of falls Parkinsons Disease Cutoff score <15/30= fall risk (Hoehn & Yahr 1-4)  Minimally Clinically Important Difference (MCID)  Stroke (acute, subacute, and chronic) = MDC: 4.2 points Vestibular (acute) = MDC: 6  points Community Dwelling Older Adults =  MCID: 4 points Parkinsons Disease  =  MDC: 4.3 points  (Academy of Neurologic Physical Therapy (nd). Functional Gait Assessment. Retrieved from https://www.neuropt.org/docs/default-source/cpgs/core-outcome-measures/function-gait-assessment-pocket-guide-proof9-(2).pdf?sfvrsn=b84f35043_0.)  PATIENT SURVEYS:  None relevant to chief complaint and age range.                                                                                                                              TREATMENT DATE: 10/25/2024  -500 ft SBA no AD (outdoor over unlevel sidewalk and grass managing traffic cones and large cable overground) - ongoing hyperextension, mild left drift w/ distance -FGA:  [facility] PT Assessment - 10/25/24 1409       Functional Gait  Assessment   Gait assessed  Yes    Gait Level Surface Walks 20 ft in less than 5.5 sec, no assistive devices, good speed, no evidence for imbalance, normal  gait pattern, deviates no more than 6 in outside of the 12 in walkway width.    Change in Gait Speed Able to change speed, demonstrates mild gait deviations, deviates 6-10 in outside of the 12 in walkway width, or no gait deviations, unable to achieve a major change in velocity, or uses a change in velocity, or uses an assistive device.    Gait with Horizontal Head Turns Performs head turns smoothly with slight change in gait velocity (eg, minor disruption to smooth gait path), deviates 6-10 in outside 12 in walkway width, or uses an assistive device.    Gait with Vertical Head Turns Performs head turns with no change in gait. Deviates no more than 6 in outside 12 in walkway width.    Gait and Pivot Turn Pivot turns safely within 3 sec and stops quickly with no loss of balance.    Step Over Obstacle Is able to step over one shoe box (4.5 in total height) without changing gait speed. No evidence of imbalance.    Gait with Narrow Base of Support Ambulates 7-9 steps.   9 steps  consecutively w/o LOB   Gait with Eyes Closed Walks 20 ft, uses assistive device, slower speed, mild gait deviations, deviates 6-10 in outside 12 in walkway width. Ambulates 20 ft in less than 9 sec but greater than 7 sec.    Ambulating Backwards Walks 20 ft, slow speed, abnormal gait pattern, evidence for imbalance, deviates 10-15 in outside 12 in walkway width.    Steps Alternating feet, must use rail.   needs rail for reciprocal descent   Total Score 22    FGA comment: 22/30 = moderate fall risk         - no AD CGA due to rapid knee hyperextension due to fatigue:  11.69 sec = 0.86 m/sec OR 2.82 ft/sec  PATIENT EDUCATION: Education details: Continue HEP - verbally reviewed this session.  Process for re-cert and scheduling ongoing visits.  Progress towards goals. Education method: Explanation, Demonstration, Verbal cues, and Handouts Education comprehension: verbalized understanding and needs further education  HOME EXERCISE PROGRAM: Access Code: [email or web address] URL: [email or web address] Date: 08/19/2024 Prepared by: Daved Bull  Exercises - Seated Hamstring Curls with Resistance  - 1 x daily - 7 x weekly - 2 sets - 10 reps - Standing Hamstring Curl with Resistance  - 1 x daily - 7 x weekly - 2 sets - 10 reps - Squat with Chair Touch and Resistance Loop  - 1 x daily - 5 x weekly - 2 sets - 12 reps - Side Stepping with Resistance at Thighs  - 1 x daily - 5 x weekly - 3 sets - 10 reps - Modified Single-Leg Deadlift  - 1 x daily - 5 x weekly - 2 sets - 10 reps - Walking Step Over  - 1 x daily - 5 x weekly - 3 sets - 10 reps -Retro stepping  GOALS: Goals reviewed with patient? Yes  SHORT TERM GOALS: Target date: 09/16/2024  Pt will be independent and compliant with introductory strength and balance focused HEP in order to maintain functional progress and improve mobility. Baseline:  Established on eval; progressed 11/24 Goal status: IN PROGRESS  2.  Pt will be  assessed for most appropriate hyperextension management w/ edu on appropriate wear or use. Baseline: Discussed bracing vs taping options on eval; holding on knee cage as pt thinks it over (11/24) Goal status: IN PROGRESS  3.  Pt will initiate aquatic therapy for  improved balance management and strengthening. Baseline: To be scheduled; pt actively in aquatics (11/24) Goal status: MET  4.  Pt will decrease 5xSTS to </=12 seconds w/o UE support in order to demonstrate decreased risk for falls and improved functional bilateral LE strength and power. Baseline: 14.54 sec no UE support; 10.59 sec no UE support (11/24) Goal status: MET  5.  Pt will demonstrate a gait speed of >/=2.75 feet/sec in order to decrease risk for falls. Baseline: 2.55 ft/sec no AD SBA; 3.33 ft/sec no AD SBA (11/24) Goal status: MET  LONG TERM GOALS: Target date: 10/14/2024  Pt will be independent and compliant with advanced and finalized strength and balance focused HEP in order to maintain functional progress and improve mobility. Baseline: IND and compliant (12/23) Goal status: MET  2.  Pt will improve FGA score to >/=26/30 in order to demonstrate improved balance and decreased fall risk. Baseline: 21/30; 22/30 (12/23) Goal status: IN PROGRESS  3.  Pt will demonstrate a gait speed of >/=3.53 feet/sec in order to decrease risk for falls. Baseline: 2.55 ft/sec no AD SBA; 3.33 ft/sec no AD SBA (11/24); 2.82 ft/sec no AD CGA (12/23) Goal status: NOT MET  4.  Pt will ambulate >/=500 feet without AD independently over level and unlevel surfaces using appropriate knee control in order to promote household and community access. Baseline: ambulates w/ knee hyperextension no AD limited community distances SBA-CGA; 500 ft SBA no AD - ongoing hyperextension, mild left drift w/ distance (12/23) Goal status: IN PROGRESS   SHORT TERM GOALS = LONG TERM GOALS: Target date: 12/09/2024 (to match final appt)  1.  Pt will improve  FGA score to >/=26/30 in order to demonstrate improved balance and decreased fall risk. Baseline: 21/30; 22/30 (12/23) Goal status: ONGOING  2.  Pt will demonstrate a gait speed of >/=3.53 feet/sec in order to decrease risk for falls. Baseline: 2.55 ft/sec no AD SBA; 3.33 ft/sec no AD SBA (11/24); 2.82 ft/sec no AD CGA (12/23) Goal status: ONGOING  3.  Pt will ambulate >/=500 feet without AD independently over level and unlevel surfaces using appropriate knee control in order to promote household and community access. Baseline: ambulates w/ knee hyperextension no AD limited community distances SBA-CGA; 500 ft SBA no AD - ongoing hyperextension, mild left drift w/ distance (12/23) Goal status: ONGOING  ASSESSMENT:  CLINICAL IMPRESSION: Focus of skilled session today on assessing LTGs and discussing ongoing POC.  Increasing frequency to 2x/wk as pt schedule will allow for this at current and she is making mixed progress towards goals.  Her gait speed did regress some this assessment, but she was more fatigued from balance tasks prior impacting mechanics of gait.  Her L knee continues to hyperextend particularly with challenges high level balance tasks and long distance gait.  She would like to further assess need for AFO vs knee cage so PT to further evaluate in coming session.  Will continue aquatic modality as pt has made great progress in her confidence and stride mechanics with incorporation of this environment. Continue per POC.    OBJECTIVE IMPAIRMENTS: Abnormal gait, decreased activity tolerance, decreased balance, decreased coordination, decreased knowledge of use of DME, decreased strength, and improper body mechanics.   ACTIVITY LIMITATIONS: lifting, standing, squatting, transfers, and locomotion level  PARTICIPATION LIMITATIONS: driving, shopping, community activity, and occupation  PERSONAL FACTORS: Past/current experiences and 1-2 comorbidities: MDD/panic disorder are also affecting  patient's functional outcome.   REHAB POTENTIAL: Excellent  CLINICAL DECISION MAKING: Evolving/moderate complexity  EVALUATION COMPLEXITY: Moderate  PLAN:  PT FREQUENCY: 1x/week + 2x/wk  PT DURATION: 8 weeks + 5 wks   PLANNED INTERVENTIONS: [id number]- PT Re-evaluation, 97750- Physical Performance Testing, 97110-Therapeutic exercises, 97530- Therapeutic activity, [id number]- Neuromuscular re-education, 97535- Self Care, [id number]- Manual therapy, [id number]- Gait training, [phone]- Orthotic Initial, [phone]- Orthotic/Prosthetic subsequent, [phone]- Aquatic Therapy, [phone]- Electrical stimulation (manual), Patient/Family education, Balance training, Stair training, Taping, Joint mobilization, Vestibular training, and DME instructions  PLAN FOR NEXT SESSION: Expand HEP prn, SLS and high level stability, treadmill training, step ups and heel taps, spanish squats, terminal knee extension, hamstring strengthening - deadlift variations; pursue knee cage order at re-cert if pt desires - pt interested in AFO on 12/18 reporting it helps her balance more - will reassess. Aquatics:  Walking mechanics, SL elevated STS, core; NO AQUATIC HEP - no pool access;  [facility]  Check all possible CPT codes: See Planned Interventions List for Planned CPT Codes    Check all conditions that are expected to impact treatment: Neurological condition and/or seizures, Psychological or psychiatric disorders, and Social determinants of health   If treatment provided at initial evaluation, no treatment charged due to lack of authorization.    Daved KATHEE Bull, PT, DPT 10/25/2024, 2:47 PM        "

## 2024-10-25 NOTE — Progress Notes (Signed)
 "  Subjective:    Patient ID: Cassidy Bruce, female    DOB: October 13, 1996, 28 y.o.   MRN: [id number]  HPI Discussed the use of AI scribe software for clinical note transcription with the patient, who gave verbal consent to proceed.  History of Present Illness Cassidy Bruce is a 28 year old female with functional deficits following bilateral cerebellar infarcts who presents for follow-up of neurorehabilitation.  She continues to participate in physical therapy, which she describes as beneficial. Occupational therapy has been completed. She has not received speech therapy due to scheduling limitations, but does not perceive a need for it, stating her speech is adequate.  She reports mild memory impairment, primarily in situations involving multitasking or increased stress, such as during a recent family bereavement. She compensates by writing lists and denies significant forgetfulness outside of these circumstances. She has not attempted activities requiring complex concentration, but is able to read and listen to podcasts without difficulty.  She is currently engaged in aquatic therapy, alternating weekly between pool and office sessions. Therapy frequency will increase to twice weekly in the new year. She currently uses a cane only for longer distances.    Pain Inventory Average Pain 9 Pain Right Now 0 My pain is intermittent and aching  In the last 24 hours, has pain interfered with the following? General activity 8 Relation with others 0 Enjoyment of life 0 What TIME of day is your pain at its worst? daytime and night Sleep (in general) Good  Pain is worse with: some activites Pain improves with: medication Relief from Meds: 7  Family History  Problem Relation Age of Onset   Cancer Other    Diabetes Other    CAD Other    Healthy Mother    Social History   Socioeconomic History   Marital status: Single    Spouse name: Not on file   Number of children: Not on file    Years of education: Not on file   Highest education level: Not on file  Occupational History   Not on file  Tobacco Use   Smoking status: Never   Smokeless tobacco: Never  Vaping Use   Vaping status: Never Used  Substance and Sexual Activity   Alcohol use: No   Drug use: No   Sexual activity: Yes    Birth control/protection: Implant  Other Topics Concern   Not on file  Social History Narrative   Not on file   Social Drivers of Health   Tobacco Use: Low Risk (10/25/2024)   Patient History    Smoking Tobacco Use: Never    Smokeless Tobacco Use: Never    Passive Exposure: Not on file  Financial Resource Strain: High Risk (09/23/2024)   Overall Financial Resource Strain (CARDIA)    Difficulty of Paying Living Expenses: Very hard  Food Insecurity: No Food Insecurity (10/17/2024)   Epic    Worried About Radiation Protection Practitioner of Food in the Last Year: Never true    Ran Out of Food in the Last Year: Never true  Transportation Needs: No Transportation Needs (10/14/2024)   Epic    Lack of Transportation (Medical): No    Lack of Transportation (Non-Medical): No  Physical Activity: Inactive (09/21/2024)   Exercise Vital Sign    Days of Exercise per Week: 0 days    Minutes of Exercise per Session: 0 min  Stress: No Stress Concern Present (09/21/2024)   [employer] of Occupational Health - Occupational Stress Questionnaire    Feeling  of Stress: Only a little  Social Connections: Moderately Isolated (09/21/2024)   Social Connection and Isolation Panel    Frequency of Communication with Friends and Family: More than three times a week    Frequency of Social Gatherings with Friends and Family: More than three times a week    Attends Religious Services: 1 to 4 times per year    Active Member of Clubs or Organizations: No    Attends Banker Meetings: Never    Marital Status: Never married  Depression (PHQ2-9): Low Risk (10/25/2024)   Depression (PHQ2-9)    PHQ-2 Score: 2   Alcohol Screen: Low Risk (09/21/2024)   Alcohol Screen    Last Alcohol Screening Score (AUDIT): 0  Housing: High Risk (10/14/2024)   Epic    Unable to Pay for Housing in the Last Year: No    Number of Times Moved in the Last Year: 0    Homeless in the Last Year: Yes  Utilities: Not At Risk (10/14/2024)   Epic    Threatened with loss of utilities: No  Health Literacy: Adequate Health Literacy (09/21/2024)   B1300 Health Literacy    Frequency of need for help with medical instructions: Never   Past Surgical History:  Procedure Laterality Date   TRANSESOPHAGEAL ECHOCARDIOGRAM (CATH LAB) N/A 07/07/2024   Procedure: TRANSESOPHAGEAL ECHOCARDIOGRAM;  Surgeon: Lonni Slain, MD;  Location: [facility] INVASIVE CV LAB;  Service: Cardiovascular;  Laterality: N/A;   Past Surgical History:  Procedure Laterality Date   TRANSESOPHAGEAL ECHOCARDIOGRAM (CATH LAB) N/A 07/07/2024   Procedure: TRANSESOPHAGEAL ECHOCARDIOGRAM;  Surgeon: Lonni Slain, MD;  Location: [facility] INVASIVE CV LAB;  Service: Cardiovascular;  Laterality: N/A;   Past Medical History:  Diagnosis Date   ADHD    Depression    Stroke (HCC) 07/05/2024   BP 118/73   Pulse 85   Ht 5' 4 (1.626 m)   Wt 15 lb (6.804 kg)   SpO2 98%   BMI 2.57 kg/m   Opioid Risk Score:   Fall Risk Score:  `1  Depression screen PHQ 2/9     10/25/2024    3:12 PM 10/14/2024    1:48 PM 09/21/2024    3:04 PM  Depression screen PHQ 2/9  Decreased Interest 1 0 0  Down, Depressed, Hopeless 1 1 1   PHQ - 2 Score 2 1 1   Altered sleeping   0  Tired, decreased energy   1  Change in appetite   0  Feeling bad or failure about yourself    1  Trouble concentrating   0  Moving slowly or fidgety/restless   0  Suicidal thoughts   0  PHQ-9 Score   3  Difficult doing work/chores   Somewhat difficult    Review of Systems  Musculoskeletal:  Positive for gait problem.       Left shoulder, left upper back pain  All other systems reviewed and are  negative.      Objective:   Physical Exam  General no acute distress Mood and affect are appropriate Extremities without edema Motor strength is 5/5 bilateral deltoid, bicep, tricep grip, hip flexor, knee extensor, ankle dorsiflexion plantarflexion There is mild dysmetria left finger-nose-finger, no evidence of dysmetria right finger-nose-finger No evidence of dysmetria bilateral heel-to-shin She ambulates without assistive device there is no evidence of toe drag or knee instability.  She is able to heel walk and toe walk.  She can walk short distance with tandem gait but not further than 10 feet  Assessment & Plan:   Assessment and Plan Assessment & Plan Functional deficits secondary to bilateral cerebellar infarcts Mild residual coordination impairment and forgetfulness under stress. Engaged in physical and aquatic therapy. Declined speech therapy and neuropsychological evaluation for now. - Continue physical and aquatic therapy through early February. - Offered speech therapy referral; she declined. - Discussed neuropsychological evaluation; she will consider if cognitive symptoms worsen. - Follow-up   in six weeks.  Gluteal muscle weakness Persistent weakness post-stroke. Participates in regular physical therapy. - Continue physical therapy with alternating land-based and aquatic sessions, increasing to twice weekly in the new year. - Assessed lower extremity strength and gait; noted improvement and reduced cane use.   "

## 2024-11-04 ENCOUNTER — Encounter: Payer: Self-pay | Admitting: Family

## 2024-11-04 ENCOUNTER — Ambulatory Visit (INDEPENDENT_AMBULATORY_CARE_PROVIDER_SITE_OTHER): Admitting: Family

## 2024-11-04 VITALS — BP 127/82 | HR 58 | Temp 98.7°F | Resp 16 | Ht 64.0 in | Wt 153.0 lb

## 2024-11-04 DIAGNOSIS — Z13228 Encounter for screening for other metabolic disorders: Secondary | ICD-10-CM

## 2024-11-04 DIAGNOSIS — Z13220 Encounter for screening for lipoid disorders: Secondary | ICD-10-CM

## 2024-11-04 DIAGNOSIS — Z0000 Encounter for general adult medical examination without abnormal findings: Secondary | ICD-10-CM

## 2024-11-04 DIAGNOSIS — Z130 Encounter for screening for diseases of the blood and blood-forming organs and certain disorders involving the immune mechanism: Secondary | ICD-10-CM

## 2024-11-04 DIAGNOSIS — F419 Anxiety disorder, unspecified: Secondary | ICD-10-CM

## 2024-11-04 DIAGNOSIS — Z1329 Encounter for screening for other suspected endocrine disorder: Secondary | ICD-10-CM | POA: Diagnosis not present

## 2024-11-04 DIAGNOSIS — Z131 Encounter for screening for diabetes mellitus: Secondary | ICD-10-CM | POA: Diagnosis not present

## 2024-11-04 DIAGNOSIS — F32A Depression, unspecified: Secondary | ICD-10-CM

## 2024-11-04 DIAGNOSIS — Z139 Encounter for screening, unspecified: Secondary | ICD-10-CM | POA: Diagnosis not present

## 2024-11-04 MED ORDER — ESCITALOPRAM OXALATE 5 MG PO TABS: 5.0000 mg | ORAL_TABLET | Freq: Every day | ORAL | 0 refills | Status: AC

## 2024-11-04 NOTE — Progress Notes (Signed)
 "  Cardiology Office Note:   Date:  11/11/2024  ID:  Cassidy Bruce, DOB [birth year], MRN [id number] PCP:  Cassidy Greig PARAS, NP  [facility] Providers Cardiologist:  Wendel Haws, MD Referring MD: Cassidy Greig PARAS, NP  Chief Complaint/Reason for Referral:  PFO/CVA ASSESSMENT:    1. Cerebrovascular accident (CVA) due to occlusion of left vertebral artery (HCC)   2. PFO (patent foramen ovale)   3. Vertebral artery dissection   4. Hyperlipidemia LDL goal <55   5. Pre-procedure lab exam        PLAN:   In order of problems listed above: CVA: Evaluation has been negative.  Repeat CT scan demonstrated overall improved appearance of left vertebral system.  Reviewed case with Dr. Rosemarie.  He recommends PFO closure.  Patient has completed 3 months of DAPT as recommended by neurology.  Will stop Plavix  and continue aspirin  81 mg.  After PFO closure will restart Plavix .  Continue rosuvastatin  40 mg.  Patient agreeable to PFO closure on 12/02/24. PFO: See discussion above Vertebral artery dissection: Repeat CT scan with improved appearance.  See discussion above. Hyperlipidemia: Given history of stroke goal LDL is less than 55.  Continue rosuvastatin  40 mg, Zetia  10mg             Dispo:  Return 1 month after PFO closure.       I spent 31 minutes reviewing all clinical data during and prior to this visit including all relevant imaging studies, laboratories, clinical information from other health systems and prior notes from both Cardiology and other specialties, interviewing the patient, conducting a complete physical examination, and coordinating care in order to formulate a comprehensive and personalized evaluation and treatment plan.   History of Present Illness:    FOCUSED PROBLEM LIST:   Dysarthria/ataxia September 2025 R cerebellar infarction Head CT  Bilateral cerebellar infarctions L>R Head MRI L vertebral dissection Head/neck CTA CTA September 2025: Left vertebral artery dissection  with occlusion of the proximal left V1 and reconstitution along distal V2 segment Not thought to be cause of CVA presentationMild to moderate narrowing/irregularity of the left V2 CTA December 2025: Mild to moderate narrowing of the left V2 with overall improved patency of vertebral system PFO with bidirectional shunting, EF 60 to 65% TEE [name] grade 3 PFO TCD No DVT LE dopplers Negative hypercoagulable panel No atrial fibrillation monitor 2025 ROPE score 10 PASCAL possible designation ADHD Depression BMI 26 August 2024:  Patient consents to use of AI scribe. The patient is a 29 year old female with evaluation of problems who presented with an acute stroke last month.  Patient was in her normal state of health up until the day of presentation.  She developed what was initially thought is seizure-like activity, ataxia, and dysarthria.  She was diagnosed with bilateral cerebellar infarctions.  In the course of her workup a TEE demonstrated a small PFO and a TCD was positive.  Her CTA demonstrated no large vessel occlusions but did demonstrate a vertebral artery dissection on the left side with reconstitution.  Was not thought that this finding was related to her presentation given the bilateral infarctions seen.  Her hypercoagulable workup was negative.  She is ultimately discharged to rehabilitation and later discharged home.  She is here to discuss further management of her PFO in the context of a stroke.  She is not currently undergoing speech or physical therapy but plans to seek therapy services now that she has insurance. 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  She reports some residual weakness in her left arm and slight difficulty walking. She is right-handed. Her family notes that she sometimes carries a heavy bag, which exacerbates her symptoms, but she is learning to manage her limitations.  She is currently taking aspirin  and  Plavix . No palpitations or issues with her medications since being discharged.  She lives in [place] with her family, who are actively involved in her care. They are coordinating her visits and ensuring she remains in [place] for her recovery.  Plan:  Refer for monitor.  November 2025:  Patient consents to use of AI scribe. The patient's monitor demonstrated no atrial fibrillation.  She is here to discuss further.  I did reach out to neurology who suggested that the patient should undergo repeat CT a to evaluate her dissection prior to potential PFO closure.  January 2026:  Patient consents to use of AI scribe. She has been using a speech aid, which has improved her speech, although she occasionally experiences visual disturbances. No recent episodes of fast heartbeats or other significant symptoms have occurred since her last visit.  She is doing much better.  She feels like her speech is coming to her much easier.  She is having less word finding difficulties.  She has had no issues with bleeding or bruising while on dual antiplatelet therapy.  She has had no recurrent signs or symptoms of stroke.      Current Medications: Current Meds  Medication Sig   acetaminophen  (TYLENOL ) 500 MG tablet Take 500 mg by mouth every 6 (six) hours as needed.   aspirin  EC 81 MG tablet Take 1 tablet (81 mg total) by mouth daily. Swallow whole.   butalbital -acetaminophen -caffeine  (FIORICET ) 50-325-40 MG tablet Take 1 tablet by mouth every 8 (eight) hours as needed for headache (if not effective with tylenol ).   citalopram  (CELEXA ) 20 MG tablet Take 1 tablet (20 mg total) by mouth daily.   escitalopram  (LEXAPRO ) 5 MG tablet Take 1 tablet (5 mg total) by mouth daily.   ezetimibe  (ZETIA ) 10 MG tablet Take 1 tablet (10 mg total) by mouth daily.   ferrous sulfate  325 (65 FE) MG tablet Take 1 tablet (325 mg total) by mouth every other day.   oxyCODONE  (OXY IR/ROXICODONE ) 5 MG immediate release tablet Take 1  tablet (5 mg total) by mouth every 4 (four) hours as needed for breakthrough pain.   rosuvastatin  (CRESTOR ) 40 MG tablet Take 1 tablet (40 mg total) by mouth daily.   Vitamin D , Ergocalciferol , (DRISDOL ) 1.25 MG (50000 UNIT) CAPS capsule Take 1 capsule (50,000 Units total) by mouth every 7 (seven) days.     Review of Systems:   Please see the history of present illness.    All other systems reviewed and are negative.     EKGs/Labs/Other Test Reviewed:   EKG:  2025 NSR  EKG Interpretation Date/Time:  Friday November 11 2024 14:40:35 EST Ventricular Rate:  66 PR Interval:  164 QRS Duration:  76 QT Interval:  416 QTC Calculation: 436 R Axis:   50  Text Interpretation: Normal sinus rhythm Normal ECG When compared with ECG of 06-Jul-2024 07:41, PREVIOUS ECG IS PRESENT Confirmed by Wendel Haws ([id number]) on 11/11/2024 2:44:05 PM        CARDIAC STUDIES: Refer to CV Procedures and Imaging Tabs   Risk Assessment/Calculations:          Physical Exam:   VS:  BP 116/76   Pulse 68  Ht 5' 4 (1.626 m)   Wt 150 lb (68 kg)   LMP 10/23/2024 (Exact Date)   SpO2 98%   BMI 25.75 kg/m        Wt Readings from Last 3 Encounters:  11/11/24 150 lb (68 kg)  11/04/24 153 lb (69.4 kg)  10/25/24 15 lb (6.804 kg)      GENERAL:  No apparent distress, AOx3 HEENT:  No carotid bruits, +2 carotid impulses, no scleral icterus CAR: RRR no murmurs, gallops, rubs, or thrills RES:  Clear to auscultation bilaterally ABD:  Soft, nontender, nondistended, positive bowel sounds x 4 VASC:  +2 radial pulses, +2 carotid pulses NEURO:  CN 2-12 grossly intact; mildly aphasic PSYCH:  No active depression or anxiety EXT:  No edema, ecchymosis, or cyanosis  Signed, Annahi Short K Obdulia Steier, MD  11/11/2024 3:01 PM    [facility] [address] [place], [place], [place]  [address] Phone: [phone]; Fax: [phone]   Note:  This document was prepared using Dragon voice recognition software and may  include unintentional dictation errors. "

## 2024-11-04 NOTE — Progress Notes (Signed)
 "   Patient ID: Cassidy Bruce, female    DOB: [birth year]  MRN: [id number]  CC: Annual Exam  Subjective: Cassidy Bruce is a 29 y.o. female who presents for annual exam.   Her concerns today include:  - Up to date on cervical cancer screening per [facility].  - Anxiety depression. Would like to try medication and referral to therapist to see if this helps. States in the past she doesn't feel like Citalopram  helped. She denies thoughts of self-harm, suicidal ideations, homicidal ideations.  Patient Active Problem List   Diagnosis Date Noted   Severe episode of recurrent major depressive disorder, without psychotic features (HCC) 07/15/2024   Panic disorder with agoraphobia and moderate panic attacks 07/15/2024   Thromboembolic stroke (HCC) 07/13/2024   PFO (patent foramen ovale) 07/07/2024   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   Hypokalemia 07/06/2024   Stroke (HCC) 07/05/2024   MDD (major depressive disorder), recurrent episode, moderate (HCC) 07/25/2018   Adjustment disorder with mixed anxiety and depressed mood      Medications Ordered Prior to Encounter[1]  Allergies[2]  Social History   Socioeconomic History   Marital status: Single    Spouse name: Not on file   Number of children: Not on file   Years of education: Not on file   Highest education level: Not on file  Occupational History   Not on file  Tobacco Use   Smoking status: Never   Smokeless tobacco: Never  Vaping Use   Vaping status: Never Used  Substance and Sexual Activity   Alcohol use: No   Drug use: No   Sexual activity: Yes    Birth control/protection: Implant  Other Topics Concern   Not on file  Social History Narrative   Not on file   Social Drivers of Health   Tobacco Use: Low Risk (11/04/2024)   Patient History    Smoking Tobacco Use: Never    Smokeless Tobacco Use: Never    Passive Exposure: Not on file  Financial Resource Strain: High Risk (09/23/2024)   Overall Financial Resource  Strain (CARDIA)    Difficulty of Paying Living Expenses: Very hard  Food Insecurity: No Food Insecurity (10/17/2024)   Epic    Worried About Radiation Protection Practitioner of Food in the Last Year: Never true    Ran Out of Food in the Last Year: Never true  Transportation Needs: No Transportation Needs (10/14/2024)   Epic    Lack of Transportation (Medical): No    Lack of Transportation (Non-Medical): No  Physical Activity: Inactive (09/21/2024)   Exercise Vital Sign    Days of Exercise per Week: 0 days    Minutes of Exercise per Session: 0 min  Stress: No Stress Concern Present (09/21/2024)   [employer] of Occupational Health - Occupational Stress Questionnaire    Feeling of Stress: Only a little  Social Connections: Moderately Isolated (09/21/2024)   Social Connection and Isolation Panel    Frequency of Communication with Friends and Family: More than three times a week    Frequency of Social Gatherings with Friends and Family: More than three times a week    Attends Religious Services: 1 to 4 times per year    Active Member of [employer] or Organizations: No    Attends Banker Meetings: Never    Marital Status: Never married  Intimate Partner Violence: Not At Risk (10/14/2024)   Epic    Fear of Current or Ex-Partner: No    Emotionally Abused: No  Physically Abused: No    Sexually Abused: No  Depression (PHQ2-9): Low Risk (11/04/2024)   Depression (PHQ2-9)    PHQ-2 Score: 1  Alcohol Screen: Low Risk (09/21/2024)   Alcohol Screen    Last Alcohol Screening Score (AUDIT): 0  Housing: High Risk (10/14/2024)   Epic    Unable to Pay for Housing in the Last Year: No    Number of Times Moved in the Last Year: 0    Homeless in the Last Year: Yes  Utilities: Not At Risk (10/14/2024)   Epic    Threatened with loss of utilities: No  Health Literacy: Adequate Health Literacy (09/21/2024)   B1300 Health Literacy    Frequency of need for help with medical instructions: Never     Family History  Problem Relation Age of Onset   Cancer Other    Diabetes Other    CAD Other    Healthy Mother     Past Surgical History:  Procedure Laterality Date   TRANSESOPHAGEAL ECHOCARDIOGRAM (CATH LAB) N/A 07/07/2024   Procedure: TRANSESOPHAGEAL ECHOCARDIOGRAM;  Surgeon: Lonni Slain, MD;  Location: [facility] INVASIVE CV LAB;  Service: Cardiovascular;  Laterality: N/A;    ROS: Review of Systems Negative except as stated above  PHYSICAL EXAM: BP 127/82   Pulse (!) 58   Temp 98.7 F (37.1 C) (Oral)   Resp 16   Ht 5' 4 (1.626 m)   Wt 153 lb (69.4 kg)   LMP 10/23/2024 (Exact Date)   SpO2 92%   BMI 26.26 kg/m   Physical Exam HENT:     Head: Normocephalic and atraumatic.     Right Ear: Tympanic membrane, ear canal and external ear normal.     Left Ear: Tympanic membrane, ear canal and external ear normal.     Nose: Nose normal.     Mouth/Throat:     Mouth: Mucous membranes are moist.     Pharynx: Oropharynx is clear.  Eyes:     Extraocular Movements: Extraocular movements intact.     Conjunctiva/sclera: Conjunctivae normal.     Pupils: Pupils are equal, round, and reactive to light.  Neck:     Thyroid: No thyroid mass, thyromegaly or thyroid tenderness.  Cardiovascular:     Rate and Rhythm: Bradycardia present.     Pulses: Normal pulses.     Heart sounds: Normal heart sounds.  Pulmonary:     Effort: Pulmonary effort is normal.     Breath sounds: Normal breath sounds.  Chest:     Comments: Patient declined.  Abdominal:     General: Bowel sounds are normal.     Palpations: Abdomen is soft.  Genitourinary:    Comments: Patient declined. Musculoskeletal:        General: Normal range of motion.     Right shoulder: Normal.     Left shoulder: Normal.     Right upper arm: Normal.     Left upper arm: Normal.     Right elbow: Normal.     Left elbow: Normal.     Right forearm: Normal.     Left forearm: Normal.     Right wrist: Normal.     Left  wrist: Normal.     Right hand: Normal.     Left hand: Normal.     Cervical back: Normal, normal range of motion and neck supple.     Thoracic back: Normal.     Lumbar back: Normal.     Right hip: Normal.  Left hip: Normal.     Right upper leg: Normal.     Left upper leg: Normal.     Right knee: Normal.     Left knee: Normal.     Right lower leg: Normal.     Left lower leg: Normal.     Right ankle: Normal.     Left ankle: Normal.     Right foot: Normal.     Left foot: Normal.  Skin:    General: Skin is warm and dry.     Capillary Refill: Capillary refill takes less than 2 seconds.  Neurological:     General: No focal deficit present.     Mental Status: She is alert and oriented to person, place, and time.  Psychiatric:        Mood and Affect: Mood normal.        Behavior: Behavior normal.    ASSESSMENT AND PLAN: 1. Annual physical exam (Primary) - Counseled on 150 minutes of exercise per week as tolerated, healthy eating (including decreased daily intake of saturated fats, cholesterol, added sugars, sodium), STI prevention, and routine healthcare maintenance.  2. Screening for metabolic disorder - Routine screening.  - CMP14+EGFR  3. Screening for deficiency anemia - Routine screening.  - CBC  4. Diabetes mellitus screening - Routine screening.  - Hemoglobin A1c  5. Screening cholesterol level - Routine screening.  - Lipid panel  6. Thyroid disorder screen - Routine screening.  - TSH  7. Anxiety and depression - Patient denies thoughts of self-harm, suicidal ideations, homicidal ideations. - Citalopram  discontinued per patient preference.  - Trial Escitalopram as prescribed. Counseled on medication adherence/adverse effects.  - Referral to Psychiatry for evaluation/management.  - Follow-up with primary provider as scheduled.  - escitalopram (LEXAPRO) 5 MG tablet; Take 1 tablet (5 mg total) by mouth daily.  Dispense: 90 tablet; Refill: 0 - Ambulatory  referral to Psychiatry  8. Encounter for screening involving social determinants of health (SDoH) - Referral to [facility] Care Management for community resources.  - AMB Referral [facility] Care Management  Patient was given the opportunity to ask questions.  Patient verbalized understanding of the plan and was able to repeat key elements of the plan. Patient was given clear instructions to go to Emergency Department or return to medical center if symptoms don't improve, worsen, or new problems develop.The patient verbalized understanding.   Orders Placed This Encounter  Procedures   CBC   Lipid panel   CMP14+EGFR   Hemoglobin A1c   TSH   AMB Referral [facility] Care Management   Ambulatory referral to Psychiatry     Requested Prescriptions   Signed Prescriptions Disp Refills   escitalopram (LEXAPRO) 5 MG tablet 90 tablet 0    Sig: Take 1 tablet (5 mg total) by mouth daily.    Return in about 1 year (around 11/04/2025) for Physical per patient preference.  Greig JINNY Chute, NP      [1]  Current Outpatient Medications on File Prior to Visit  Medication Sig Dispense Refill   acetaminophen  (TYLENOL ) 500 MG tablet Take 500 mg by mouth every 6 (six) hours as needed.     aspirin  EC 81 MG tablet Take 1 tablet (81 mg total) by mouth daily. Swallow whole.     butalbital -acetaminophen -caffeine  (FIORICET ) 50-325-40 MG tablet Take 1 tablet by mouth every 8 (eight) hours as needed for headache (if not effective with tylenol ). 10 tablet 0   citalopram  (CELEXA ) 20 MG tablet Take 1 tablet (20 mg  total) by mouth daily. 90 tablet 0   clopidogrel  (PLAVIX ) 75 MG tablet Take 1 tablet (75 mg total) by mouth daily. 90 tablet 3   ezetimibe  (ZETIA ) 10 MG tablet Take 1 tablet (10 mg total) by mouth daily. 90 tablet 3   ferrous sulfate  325 (65 FE) MG tablet Take 1 tablet (325 mg total) by mouth every other day. 30 tablet 0   rosuvastatin  (CRESTOR ) 40 MG tablet Take 1 tablet (40 mg total) by mouth daily. 90 tablet 3    oxyCODONE  (OXY IR/ROXICODONE ) 5 MG immediate release tablet Take 1 tablet (5 mg total) by mouth every 4 (four) hours as needed for breakthrough pain. (Patient not taking: Reported on 11/04/2024) 10 tablet 0   Vitamin D , Ergocalciferol , (DRISDOL ) 1.25 MG (50000 UNIT) CAPS capsule Take 1 capsule (50,000 Units total) by mouth every 7 (seven) days. (Patient not taking: Reported on 11/04/2024) 5 capsule 0   No current facility-administered medications on file prior to visit.  [2] No Known Allergies  "

## 2024-11-05 LAB — CMP14+EGFR
ALT: 161 IU/L — ABNORMAL HIGH (ref 0–32)
AST: 73 IU/L — ABNORMAL HIGH (ref 0–40)
Albumin: 4.6 g/dL (ref 4.0–5.0)
Alkaline Phosphatase: 87 IU/L (ref 41–116)
BUN/Creatinine Ratio: 13 (ref 9–23)
BUN: 10 mg/dL (ref 6–20)
Bilirubin Total: 0.4 mg/dL (ref 0.0–1.2)
CO2: 23 mmol/L (ref 20–29)
Calcium: 9.2 mg/dL (ref 8.7–10.2)
Chloride: 106 mmol/L (ref 96–106)
Creatinine, Ser: 0.78 mg/dL (ref 0.57–1.00)
Globulin, Total: 2.7 g/dL (ref 1.5–4.5)
Glucose: 77 mg/dL (ref 70–99)
Potassium: 4.6 mmol/L (ref 3.5–5.2)
Sodium: 143 mmol/L (ref 134–144)
Total Protein: 7.3 g/dL (ref 6.0–8.5)
eGFR: 106 mL/min/1.73

## 2024-11-05 LAB — HEMOGLOBIN A1C
Est. average glucose Bld gHb Est-mCnc: 114 mg/dL
Hgb A1c MFr Bld: 5.6 % (ref 4.8–5.6)

## 2024-11-05 LAB — LIPID PANEL
Chol/HDL Ratio: 2.1 ratio (ref 0.0–4.4)
Cholesterol, Total: 107 mg/dL (ref 100–199)
HDL: 52 mg/dL
LDL Chol Calc (NIH): 43 mg/dL (ref 0–99)
Triglycerides: 51 mg/dL (ref 0–149)
VLDL Cholesterol Cal: 12 mg/dL (ref 5–40)

## 2024-11-05 LAB — CBC
Hematocrit: 45.6 % (ref 34.0–46.6)
Hemoglobin: 14.4 g/dL (ref 11.1–15.9)
MCH: 26.4 pg — ABNORMAL LOW (ref 26.6–33.0)
MCHC: 31.6 g/dL (ref 31.5–35.7)
MCV: 84 fL (ref 79–97)
Platelets: 140 x10E3/uL — ABNORMAL LOW (ref 150–450)
RBC: 5.45 x10E6/uL — ABNORMAL HIGH (ref 3.77–5.28)
RDW: 14 % (ref 11.7–15.4)
WBC: 5 x10E3/uL (ref 3.4–10.8)

## 2024-11-05 LAB — TSH: TSH: 0.901 u[IU]/mL (ref 0.450–4.500)

## 2024-11-07 ENCOUNTER — Telehealth: Payer: Self-pay | Admitting: *Deleted

## 2024-11-07 ENCOUNTER — Other Ambulatory Visit: Payer: Self-pay

## 2024-11-07 DIAGNOSIS — D696 Thrombocytopenia, unspecified: Secondary | ICD-10-CM

## 2024-11-07 DIAGNOSIS — R748 Abnormal levels of other serum enzymes: Secondary | ICD-10-CM

## 2024-11-07 NOTE — Progress Notes (Signed)
 Complex Care Management Note Care Guide Note  11/07/2024 Name: Cassidy Bruce MRN: [id number] DOB: 1995/12/15  Cassidy Bruce is a 29 y.o. year old female who is a primary care patient of [name], NP . The community resource team was consulted for assistance with Housing   SDOH screenings and interventions completed:  Yes  Social Drivers of Health From This Encounter   Housing: High Risk (11/07/2024)   Epic    Unable to Pay for Housing in the Last Year: No    Homeless in the Last Year: Yes    SDOH Interventions Today    Flowsheet Row Most Recent Value  SDOH Interventions   Housing Interventions Community Resources Provided, [employer] Referral  [Was homeless prior to stroke advised to apply for housing]  Financial Strain Interventions Community Resources Provided, [employer] Referral     Care guide performed the following interventions: Patient provided with information about care guide support team and interviewed to confirm resource needs.  Follow Up Plan:  No further follow up planned at this time. The patient has been provided with needed resources.  Encounter Outcome:  Patient Visit Completed  Cassidy Bruce  [facility] HealthPopulation Health Care Guide  Direct [phone] Fax:[phone] Website: [email or web address]

## 2024-11-07 NOTE — Patient Instructions (Signed)

## 2024-11-07 NOTE — Patient Outreach (Signed)
 Social Drivers of Health  Community Resource and Care Coordination Visit Note   11/07/2024  Name: Cassidy Bruce MRN: [id number] DOB:[birth year]  Situation: Referral received for SDoH needs assessment and assistance related to Food Insecurity . I obtained verbal consent from Patient.  Visit completed with Patient on the phone.   Background:   SDOH Interventions Today    Flowsheet Row Most Recent Value  SDOH Interventions   Food Insecurity Interventions --  [Patient is awaiting delivery of the fruits/vegetables from [employer].]     Assessment:   Goals Addressed             This Visit's Progress    COMPLETED: BSW Goal       Current SDOH Barriers:  Financial resource strain Dental/vision provider need  Interventions: Patient interviewed and appropriate screenings performed Patient has completed the request with [facility] for fruit and vegetables and will wait for the delivery. Patient has completed an eye exam and has a list of places for Medicaid eye glasses.  Patient will follow up soon to obtain new glasses. Patient reports no new unmet needs.          Recommendation:   Patient to follow up with location for Medicaid glasses.  Follow Up Plan:   Patient declines further calls or assistance. [employer] will be closed. Patient has been provided contact information should new needs arise.   Tillman Gardener, BSW [place]  [facility], [facility] Social Worker Direct Dial: [phone]  Fax: [phone] Website: [email or web address]

## 2024-11-08 ENCOUNTER — Ambulatory Visit: Admitting: Physical Therapy

## 2024-11-11 ENCOUNTER — Encounter: Payer: Self-pay | Admitting: Internal Medicine

## 2024-11-11 ENCOUNTER — Ambulatory Visit: Attending: Internal Medicine | Admitting: Internal Medicine

## 2024-11-11 VITALS — BP 116/76 | HR 68 | Ht 64.0 in | Wt 150.0 lb

## 2024-11-11 DIAGNOSIS — Z01812 Encounter for preprocedural laboratory examination: Secondary | ICD-10-CM | POA: Insufficient documentation

## 2024-11-11 DIAGNOSIS — Q2112 Patent foramen ovale: Secondary | ICD-10-CM | POA: Insufficient documentation

## 2024-11-11 DIAGNOSIS — I63212 Cerebral infarction due to unspecified occlusion or stenosis of left vertebral arteries: Secondary | ICD-10-CM | POA: Diagnosis present

## 2024-11-11 DIAGNOSIS — I7774 Dissection of vertebral artery: Secondary | ICD-10-CM | POA: Insufficient documentation

## 2024-11-11 DIAGNOSIS — E785 Hyperlipidemia, unspecified: Secondary | ICD-10-CM | POA: Insufficient documentation

## 2024-11-11 LAB — CBC
Hematocrit: 42.2 % (ref 34.0–46.6)
Hemoglobin: 13.4 g/dL (ref 11.1–15.9)
MCH: 26.4 pg — ABNORMAL LOW (ref 26.6–33.0)
MCHC: 31.8 g/dL (ref 31.5–35.7)
MCV: 83 fL (ref 79–97)
Platelets: 108 x10E3/uL — ABNORMAL LOW (ref 150–450)
RBC: 5.08 x10E6/uL (ref 3.77–5.28)
RDW: 13.8 % (ref 11.7–15.4)
WBC: 4.9 x10E3/uL (ref 3.4–10.8)

## 2024-11-11 LAB — BASIC METABOLIC PANEL WITH GFR
BUN/Creatinine Ratio: 13 (ref 9–23)
BUN: 11 mg/dL (ref 6–20)
CO2: 24 mmol/L (ref 20–29)
Calcium: 9.3 mg/dL (ref 8.7–10.2)
Chloride: 104 mmol/L (ref 96–106)
Creatinine, Ser: 0.83 mg/dL (ref 0.57–1.00)
Glucose: 84 mg/dL (ref 70–99)
Potassium: 4.3 mmol/L (ref 3.5–5.2)
Sodium: 141 mmol/L (ref 134–144)
eGFR: 98 mL/min/1.73

## 2024-11-11 NOTE — Patient Instructions (Signed)
 Medication Instructions:  STOP Clopidogrel  (Plavix )   *If you need a refill on your cardiac medications before your next appointment, please call your pharmacy*  Lab Work: To be completed today: CBC and BMP  If you have labs (blood work) drawn today and your tests are completely normal, you will receive your results only by: MyChart Message (if you have MyChart) OR A paper copy in the mail If you have any lab test that is abnormal or we need to change your treatment, we will call you to review the results.  Testing/Procedures: Rockie, RN will be calling you on Monday 11/14/24 to discuss plan and go over instructions for your PFO closure.  Follow-Up: At [facility], you and your health needs are our priority.  As part of our continuing mission to provide you with exceptional heart care, our providers are all part of one team.  This team includes your primary Cardiologist (physician) and Advanced Practice Providers or APPs (Physician Assistants and Nurse Practitioners) who all work together to provide you with the care you need, when you need it.

## 2024-11-12 ENCOUNTER — Ambulatory Visit: Payer: Self-pay | Admitting: Internal Medicine

## 2024-11-14 ENCOUNTER — Other Ambulatory Visit: Payer: Self-pay

## 2024-11-14 DIAGNOSIS — Q2112 Patent foramen ovale: Secondary | ICD-10-CM

## 2024-11-14 DIAGNOSIS — I63212 Cerebral infarction due to unspecified occlusion or stenosis of left vertebral arteries: Secondary | ICD-10-CM

## 2024-11-14 NOTE — Telephone Encounter (Signed)
 Spoke with the patient - reviewed instructions. She will review on her own and call with any questions or concerns.  She understood she will be contacted the week of the procedure to review again. She was grateful for call.

## 2024-11-15 ENCOUNTER — Telehealth: Admitting: Licensed Clinical Social Worker

## 2024-11-15 ENCOUNTER — Ambulatory Visit: Attending: Physical Medicine & Rehabilitation | Admitting: Physical Therapy

## 2024-11-15 ENCOUNTER — Encounter: Payer: Self-pay | Admitting: Physical Therapy

## 2024-11-15 DIAGNOSIS — R208 Other disturbances of skin sensation: Secondary | ICD-10-CM | POA: Insufficient documentation

## 2024-11-15 DIAGNOSIS — R278 Other lack of coordination: Secondary | ICD-10-CM | POA: Insufficient documentation

## 2024-11-15 DIAGNOSIS — I69354 Hemiplegia and hemiparesis following cerebral infarction affecting left non-dominant side: Secondary | ICD-10-CM | POA: Insufficient documentation

## 2024-11-15 DIAGNOSIS — M6281 Muscle weakness (generalized): Secondary | ICD-10-CM | POA: Insufficient documentation

## 2024-11-15 DIAGNOSIS — R29898 Other symptoms and signs involving the musculoskeletal system: Secondary | ICD-10-CM | POA: Insufficient documentation

## 2024-11-15 DIAGNOSIS — R2681 Unsteadiness on feet: Secondary | ICD-10-CM | POA: Insufficient documentation

## 2024-11-15 NOTE — Therapy (Signed)
 " OUTPATIENT PHYSICAL THERAPY NEURO TREATMENT    Patient Name: Cassidy Bruce MRN: [id number] DOB:1996/08/07, 29 y.o., female Today's Date: 11/15/2024   PCP: Greig JINNY Drones, NP REFERRING PROVIDER: Carilyn Prentice BRAVO, MD  END OF SESSION:  PT End of Session - 11/15/24 1410     Visit Number 8    Number of Visits 17   9 + 8   Date for Recertification  12/16/24   pushed out due to multi-D scheduling and aquatic needs/holiday scheduling   Authorization Type [place] [employer]    PT Start Time 1405    PT Stop Time 1445    PT Time Calculation (min) 40 min    Equipment Utilized During Treatment Gait belt    Activity Tolerance Patient tolerated treatment well    Behavior During Therapy [facility] for tasks assessed/performed          Past Medical History:  Diagnosis Date   ADHD    Depression    Stroke (HCC) 07/05/2024   Past Surgical History:  Procedure Laterality Date   TRANSESOPHAGEAL ECHOCARDIOGRAM (CATH LAB) N/A 07/07/2024   Procedure: TRANSESOPHAGEAL ECHOCARDIOGRAM;  Surgeon: Lonni Slain, MD;  Location: [facility] INVASIVE CV LAB;  Service: Cardiovascular;  Laterality: N/A;   Patient Active Problem List   Diagnosis Date Noted   Severe episode of recurrent major depressive disorder, without psychotic features (HCC) 07/15/2024   Panic disorder with agoraphobia and moderate panic attacks 07/15/2024   Thromboembolic stroke (HCC) 07/13/2024   PFO (patent foramen ovale) 07/07/2024   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   Hypokalemia 07/06/2024   Stroke (HCC) 07/05/2024   MDD (major depressive disorder), recurrent episode, moderate (HCC) 07/25/2018   Adjustment disorder with mixed anxiety and depressed mood     ONSET DATE: 07/05/2024 (CVA)  REFERRING DIAG: I63.9 (ICD-10-CM) - Cerebral infarction, unspecified  THERAPY DIAG:  Other lack of coordination  Muscle weakness (generalized)  Hemiplegia and hemiparesis following cerebral infarction affecting left non-dominant side  (HCC)  Other symptoms and signs involving the musculoskeletal system  Other disturbances of skin sensation  Unsteadiness on feet  Rationale for Evaluation and Treatment: Rehabilitation  SUBJECTIVE:                                                                                                                                                                                             SUBJECTIVE STATEMENT: Pt presents ambulatory independently, no AD.  No falls or acute changes. She is scheduled for a PFO procedure 12/02/2024.  She reports difficulty getting off the floor without pushing on something and wants to work on this. Pt accompanied by: family member Midwife)  PERTINENT  HISTORY: MDD, CVA, PFO, panic disorder w/ agoraphobia and moderate panic attacks  Per inpt rehab discharge summary: Presented 07/05/2024 with dizziness slurred speech headache and gait abnormality with left-sided weakness. Per family she had went to the gym on Monday, 07/04/2024 for weightlifting as usual no specific problems. Tuesday morning she awoke from sleep with headache and neck pain and by the afternoon patient with increasing dizziness vertigo as well as slurred speech. Cranial CT scan showed no acute intracranial abnormalities new right cerebellar infarct since prior study of 2021 felt to be possibly chronic. 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 Distal V2 segment reconstitution but with attenuated enhancement relative to the contralateral side along the remainder of its course. A 13 mm region of ischemia affecting both cerebral hemispheres worse on the left with no more core infarct. MRI identified acute infarcts within the cerebellar vermis and bilateral cerebellar hemispheres. Most notably large acute infarct present within the superior cerebellar artery territories bilaterally. Posterior fossa mass effect without cerebellar tonsillar herniation or evidence of obstructive hydrocephalus. Petechial  hemorrhage within the cerebellar vermis and superior right cerebellar hemisphere. Patient did not receive TNK. No thrombectomy needed for left VA occlusion as BA was patent.  PAIN:  Are you having pain? No  PRECAUTIONS: Fall and Other: Zio heart monitor  RED FLAGS: None   WEIGHT BEARING RESTRICTIONS: No  FALLS: Has patient fallen in last 6 months? No and day of CVA she slid to the floor to lay down  LIVING ENVIRONMENT: Lives with: lives alone Lives in: Transitional housing - hotel Stairs: No - mostly uses elevator but will walk down the stairs Has following equipment at home: shower chair and elevator and triangle rollator  PLOF: Independent - she has been trying to navigate crowds (went to [employer] and [employer])  PATIENT GOALS: my balance  OBJECTIVE:  Note: Objective measures were completed at Evaluation unless otherwise noted.  DIAGNOSTIC FINDINGS:  Brain MRI 07/11/2024: IMPRESSION: 1. Evolving early subacute bilateral cerebellar infarcts, left slightly worse than right. Possible mild interval expansion versus changes of Wallerian degeneration (favored) involving the midbrain since previous. Associated petechial hemorrhage without frank hemorrhagic transformation. 2. Loss of normal flow void within the left vertebral artery, consistent with previously identified dissection. 3. Otherwise stable and normal brain MRI.  COGNITION: Overall cognitive status: Impaired and delayed processing and speech impairments/slurred speech   SENSATION: Light touch: WFL  COORDINATION: BLE RAMS:  LLE fatigues and slows with prolonged task Heel-to-shin:  mildly dysmetric LLE  EDEMA:  None noted in BLE  MUSCLE TONE: None noted in LLE  POSTURE: forward head - very mild  LOWER EXTREMITY ROM:     Active  Right Eval Left Eval  Hip flexion Grossly WNL  Hip extension   Hip abduction   Hip adduction   Hip internal rotation   Hip external rotation   Knee flexion   Knee  extension   Ankle dorsiflexion   Ankle plantarflexion    Ankle inversion    Ankle eversion     (Blank rows = not tested)  LOWER EXTREMITY MMT:    MMT Right Eval Left Eval  Hip flexion 5 4+  Hip extension    Hip abduction 4+ 4  Hip adduction    Hip internal rotation    Hip external rotation    Knee flexion 5 4+  Knee extension 5 5  Ankle dorsiflexion 5 5  Ankle plantarflexion    Ankle inversion    Ankle eversion    (  Blank rows = not tested)  BED MOBILITY:  Findings: Sit to supine Complete Independence Supine to sit Complete Independence Rolling to Right Complete Independence Rolling to Left Complete Independence  TRANSFERS: Sit to stand: Complete Independence  Assistive device utilized: None     Stand to sit: Complete Independence  Assistive device utilized: None     Chair to chair: SBA  Assistive device utilized: None       RAMP:  Not tested  CURB:  Not tested  STAIRS: Not tested GAIT: Findings: Gait Characteristics: step through pattern, decreased arm swing- Left, decreased stride length, and genu recurvatum- Left, Distance walked: various clinic distances, Assistive device utilized:None, Level of assistance: SBA and CGA, and Comments: mild lateral left knee thrust in stance  FUNCTIONAL TESTS:  5 times sit to stand: 14.54 sec no UE support 10 meter walk test: 12.94 sec no AD CGA = 0.77 m/sec OR 2.55 ft/sec Functional gait assessment:  FUNCTIONAL GAIT ASSESSMENT  Date: 08/19/2024 Score  GAIT LEVEL SURFACE Instructions: Walk at your normal speed from here to the next mark (6 m) [20 ft]. (2) Mild impairment - Walks 6 m (20 ft) in less than 7 seconds but greater than 5.5 seconds, uses assistive device, slower speed, mild gait deviations, or deviates 15.24 -25.4 cm (6 -10 in) outside of the 30.48-cm (12-in) walkway width.  2.   CHANGE IN GAIT SPEED Instructions: Begin walking at your normal pace (for 1.5 m [5 ft]). When I tell you go, walk as fast as you can  (for 1.5 m [5 ft]). When I tell you slow, walk as slowly as you can (for 1.5 m [5 ft]. (3) Normal - Able to smoothly change walking speed without loss of balance or gait deviation. Shows a significant difference in walking speeds between normal, fast, and slow speeds. Deviates no more than 15.24 cm (6 in) outside of the 30.48-cm (12-in) walkway width.  3.    GAIT WITH HORIZONTAL HEAD TURNS Instructions: Walk from here to the next mark 6 m (20 ft) away. Begin walking at your normal pace. Keep walking straight; after 3 steps, turn your head to the right and keep walking straight while looking to the right. After 3 more steps, turn your head to the left and keep walking straight while looking left. Continue alternating looking right and left. (2) Mild impairment - Performs head turns smoothly with slight change in gait velocity (eg, minor disruption to smooth gait path), deviates 15.24 -25.4 cm (6 -10 in) outside 30.48-cm (12-in) walkway width, or uses an assistive device.  4.   GAIT WITH VERTICAL HEAD TURNS Instructions: Walk from here to the next mark (6 m [20 ft]). Begin walking at your normal pace. Keep walking straight; after 3 steps, tip your head up and keep walking straight while looking up. After 3 more steps, tip your head down, keep walking straight while looking down. Continue  alternating looking up and down every 3 steps until you have completed 2 repetitions in each direction. (3) Normal - Performs head turns with no change in gait. Deviates no more than 15.24 cm (6 in) outside 30.48-cm (12-in) walkway width.  5.  GAIT AND PIVOT TURN Instructions: Begin with walking at your normal pace. When I tell you, turn and stop, turn as quickly as you can to face the opposite direction and stop. (1) Moderate impairment - Turns slowly, requires verbal cueing, or requires several small steps to catch balance following turn and stop  6.  STEP OVER OBSTACLE Instructions: Begin walking at your normal speed.  When you come to the shoe box, step over it, not around it, and keep walking. (1) Moderate impairment - Is able to step over one shoe box (11.43 cm [4.5 in] total height) but must slow down and adjust steps to clear box safely. May require verbal cueing.  7.   GAIT WITH NARROW BASE OF SUPPORT Instructions: Walk on the floor with arms folded across the chest, feet aligned heel to toe in tandem for a distance of 3.6 m [12 ft]. The number of steps taken in a straight line are counted for a maximum of 10 steps. (2) Mild impairment - Ambulates 7-9 steps  8.   GAIT WITH EYES CLOSED Instructions: Walk at your normal speed from here to the next mark (6 m [20 ft]) with your eyes closed. (2) Mild impairment - Walks 6 m (20 ft), uses assistive device, slower speed, mild gait deviations, deviates 15.24 -25.4 cm (6 -10 in) outside 30.48-cm (12-in) walkway width. Ambulates 6 m (20 ft) in less than 9 seconds but greater than 7 seconds  9.   AMBULATING BACKWARDS Instructions: Walk backwards until I tell you to stop (2) Mild impairment - Walks 6 m (20 ft), uses assistive device, slower speed, mild gait deviations, deviates 15.24 -25.4 cm (6 -10 in) outside 30.48-cm (12-in) walkway width  10. STEPS Instructions: Walk up these stairs as you would at home (ie, using the rail if necessary). At the top turn around and walk down. (3) Normal-Alternating feet, no rail.  Total 21/30   Interpretation of scores: Non-Specific Older Adults Cutoff Score: <=22/30 = risk of falls Parkinsons Disease Cutoff score <15/30= fall risk (Hoehn & Yahr 1-4)  Minimally Clinically Important Difference (MCID)  Stroke (acute, subacute, and chronic) = MDC: 4.2 points Vestibular (acute) = MDC: 6 points Community Dwelling Older Adults =  MCID: 4 points Parkinsons Disease  =  MDC: 4.3 points  (Academy of Neurologic Physical Therapy (nd). Functional Gait Assessment. Retrieved from  https://www.neuropt.org/docs/default-source/cpgs/core-outcome-measures/function-gait-assessment-pocket-guide-proof9-(2).pdf?sfvrsn=b42f35043_0.)  PATIENT SURVEYS:  None relevant to chief complaint and age range.                                                                                                                              TREATMENT DATE: 11/15/2024  Floor Recovery: Patient educated in floor recovery this visit using teach-back for injury assessment and sequencing of task in clinic setting.  Discussion of transfer of skills to variable scenarios outside the clinic.  Patient has most difficulty with push to stand without support surface.  Performed 3 times. Caregiver Training:  Caregiver not present. Level of Assist:  IND, MODI, and Verbal/tactile cues.  Edu on breathing throughout and various ways to transition feet to stand (frog position)/widened BOS w/ ER/using hands on LE to stabilize or push into standing if needed.  -Terminal knee extension w/ blue band x20 each LE unsupported -4 hip hike x10 each  LE -4 heel taps x15 each LE w/ unilateral UE support -4 reverse lunges w/ light BUE support x10 each LE -4 offset squats x10 each LE w/ unilateral UE support -10lb slam ball DF roll x30 feet each LE  PATIENT EDUCATION: Education details: Continue HEP - single addition today.  Options for last scheduled visit - can wrap up prior to procedure or at 2/3 visit (need resume orders).  Discussed wearing tennis shoes to next land appt in order to trial AFOs again (wearing slip-in UGGs today). Education method: Explanation, Demonstration, Verbal cues, and Handouts Education comprehension: verbalized understanding and needs further education  HOME EXERCISE PROGRAM: Access Code: [email or web address] URL: [email or web address] Date: 08/19/2024 Prepared by: Daved Bull  Exercises - Seated Hamstring Curls with Resistance  - 1 x daily - 7 x weekly - 2 sets - 10 reps - Standing  Hamstring Curl with Resistance  - 1 x daily - 7 x weekly - 2 sets - 10 reps - Squat with Chair Touch and Resistance Loop  - 1 x daily - 5 x weekly - 2 sets - 12 reps - Side Stepping with Resistance at Thighs  - 1 x daily - 5 x weekly - 3 sets - 10 reps - Modified Single-Leg Deadlift  - 1 x daily - 5 x weekly - 2 sets - 10 reps - Walking Step Over  - 1 x daily - 5 x weekly - 3 sets - 10 reps -Retro stepping - Standing Terminal Knee Extension with Resistance  - 1 x daily - 5 x weekly - 2-3 sets - 10 reps  GOALS: Goals reviewed with patient? Yes  SHORT TERM GOALS: Target date: 09/16/2024  Pt will be independent and compliant with introductory strength and balance focused HEP in order to maintain functional progress and improve mobility. Baseline:  Established on eval; progressed 11/24 Goal status: IN PROGRESS  2.  Pt will be assessed for most appropriate hyperextension management w/ edu on appropriate wear or use. Baseline: Discussed bracing vs taping options on eval; holding on knee cage as pt thinks it over (11/24) Goal status: IN PROGRESS  3.  Pt will initiate aquatic therapy for improved balance management and strengthening. Baseline: To be scheduled; pt actively in aquatics (11/24) Goal status: MET  4.  Pt will decrease 5xSTS to </=12 seconds w/o UE support in order to demonstrate decreased risk for falls and improved functional bilateral LE strength and power. Baseline: 14.54 sec no UE support; 10.59 sec no UE support (11/24) Goal status: MET  5.  Pt will demonstrate a gait speed of >/=2.75 feet/sec in order to decrease risk for falls. Baseline: 2.55 ft/sec no AD SBA; 3.33 ft/sec no AD SBA (11/24) Goal status: MET  LONG TERM GOALS: Target date: 10/14/2024  Pt will be independent and compliant with advanced and finalized strength and balance focused HEP in order to maintain functional progress and improve mobility. Baseline: IND and compliant (12/23) Goal status: MET  2.  Pt  will improve FGA score to >/=26/30 in order to demonstrate improved balance and decreased fall risk. Baseline: 21/30; 22/30 (12/23) Goal status: IN PROGRESS  3.  Pt will demonstrate a gait speed of >/=3.53 feet/sec in order to decrease risk for falls. Baseline: 2.55 ft/sec no AD SBA; 3.33 ft/sec no AD SBA (11/24); 2.82 ft/sec no AD CGA (12/23) Goal status: NOT MET  4.  Pt will ambulate >/=500 feet without AD independently over level and unlevel surfaces using appropriate knee control in order to promote  household and community access. Baseline: ambulates w/ knee hyperextension no AD limited community distances SBA-CGA; 500 ft SBA no AD - ongoing hyperextension, mild left drift w/ distance (12/23) Goal status: IN PROGRESS   SHORT TERM GOALS = LONG TERM GOALS: Target date: 12/09/2024 (to match final appt)  1.  Pt will improve FGA score to >/=26/30 in order to demonstrate improved balance and decreased fall risk. Baseline: 21/30; 22/30 (12/23) Goal status: ONGOING  2.  Pt will demonstrate a gait speed of >/=3.53 feet/sec in order to decrease risk for falls. Baseline: 2.55 ft/sec no AD SBA; 3.33 ft/sec no AD SBA (11/24); 2.82 ft/sec no AD CGA (12/23) Goal status: ONGOING  3.  Pt will ambulate >/=500 feet without AD independently over level and unlevel surfaces using appropriate knee control in order to promote household and community access. Baseline: ambulates w/ knee hyperextension no AD limited community distances SBA-CGA; 500 ft SBA no AD - ongoing hyperextension, mild left drift w/ distance (12/23) Goal status: ONGOING  ASSESSMENT:  CLINICAL IMPRESSION: Emphasis of skilled PT session today on addressing independence with variations of floor recovery.  She does progress to independence with practice and education on setup and stability options.  She has increased motor control of the LLE noted with high level balance and strengthening positions today.  She is able to ambulate community  distances without her cane.  Will consider appropriate discharge plan around upcoming procedure. Continue per POC.    OBJECTIVE IMPAIRMENTS: Abnormal gait, decreased activity tolerance, decreased balance, decreased coordination, decreased knowledge of use of DME, decreased strength, and improper body mechanics.   ACTIVITY LIMITATIONS: lifting, standing, squatting, transfers, and locomotion level  PARTICIPATION LIMITATIONS: driving, shopping, community activity, and occupation  PERSONAL FACTORS: Past/current experiences and 1-2 comorbidities: MDD/panic disorder are also affecting patient's functional outcome.   REHAB POTENTIAL: Excellent  CLINICAL DECISION MAKING: Evolving/moderate complexity  EVALUATION COMPLEXITY: Moderate  PLAN:  PT FREQUENCY: 1x/week + 2x/wk  PT DURATION: 8 weeks + 5 wks   PLANNED INTERVENTIONS: [id number]- PT Re-evaluation, 97750- Physical Performance Testing, 97110-Therapeutic exercises, 97530- Therapeutic activity, [id number]- Neuromuscular re-education, 97535- Self Care, [id number]- Manual therapy, [id number]- Gait training, [phone]- Orthotic Initial, [phone]- Orthotic/Prosthetic subsequent, [phone]- Aquatic Therapy, [phone]- Electrical stimulation (manual), Patient/Family education, Balance training, Stair training, Taping, Joint mobilization, Vestibular training, and DME instructions  PLAN FOR NEXT SESSION: Expand HEP prn, SLS and high level stability, treadmill training, spanish squats, hamstring strengthening - deadlift variations; pursue knee cage order at re-cert if pt desires - pt interested in AFO on 12/18 reporting it helps her balance more - will reassess. Aquatics:  Walking mechanics, SL elevated STS, core; NO AQUATIC HEP - no pool access;  [place] [place]  Check all possible CPT codes: See Planned Interventions List for Planned CPT Codes    Check all conditions that are expected to impact treatment: Neurological condition and/or seizures, Psychological or psychiatric disorders, and Social  determinants of health   If treatment provided at initial evaluation, no treatment charged due to lack of authorization.    Daved KATHEE Bull, PT, DPT 11/15/2024, 2:47 PM        "

## 2024-11-15 NOTE — Patient Instructions (Signed)
-   Standing Terminal Knee Extension with Resistance  - 1 x daily - 5 x weekly - 2-3 sets - 10 reps

## 2024-11-16 ENCOUNTER — Other Ambulatory Visit: Payer: Self-pay | Admitting: Licensed Clinical Social Worker

## 2024-11-16 NOTE — Patient Instructions (Signed)
 Visit Information  Ms. Cassidy Bruce was given information about Medicaid Managed Care team care coordination services as a part of their [facility] Medicaid benefit.   If you would like to schedule transportation through your [facility], please call the following number at least 2 days in advance of your appointment: [phone]   Rides for urgent appointments can also be made after hours by calling Member Services.  Call the Behavioral Health Crisis Line at [phone], at any time, 24 hours a day, 7 days a week. If you are in danger or need immediate medical attention call 911.  Please see education materials related to topics discussed provided by MyChart link.  Care plan and visit instructions communicated with the patient verbally today. Patient agrees to receive a copy in MyChart. Active MyChart status and patient understanding of how to access instructions and care plan via MyChart confirmed with patient.     Licensed Clinical Social Worker will call you on 2/11 at 1 PM  Rolin Kerns, LCSW [place]  [facility], [facility] Clinical Social Worker Direct Dial: [phone]  Fax: [phone] Website: [email or web address] 2:12 PM   Following is a copy of your plan of care:   Goals Addressed             This Visit's Progress    LCSW VBCI Social Work Care Plan   On track    Problems:   Disease Management support and education needs related to Anxiety and Depression  CSW Clinical Goal(s):   Over the next 90 days the Patient will attend all scheduled medical appointments as evidenced by patient report and care team review of appointment completion in electronic MEDICAL RECORD NUMBER  demonstrate a reduction in symptoms related to Anxiety and Depression .  Interventions:  Mental Health:  Evaluation of current treatment plan related to Anxiety and Depression Active listening / Reflection utilized Company Secretary / information provided Discussed referral for psychiatry: Pt is participating in med management through PCP Discussed referral options to connect for ongoing therapy: Discussed resources. Pt will think about if she is interested in services and f/up with LCSW at next scheduled appt Emotional Support Provided Mindfulness or Relaxation training provided Participation in support group encouraged : LCSW discussed benefits and discussed options. Pt will inform LCSW if she is interested to participate in services Provided general psycho-education for mental health needs Reviewed mental health medications and discussed importance of compliance: Pt reports compliance. Agreed to pick up medications from preferred pharmacy this week Solution-Focued Strategies employed: Healthy coping skills discussed to assist with symptom management Suicidal Ideation/Homicidal Ideation assessed: Denies  Patient Goals/Self-Care Activities:  Continue taking your medication as prescribed.   Increase coping skills and stress reduction Pick up medications from pharmacy in the next 7 days. Inform PCP of any barriers or adverse side effects  Plan:   Telephone follow up appointment with care management team member scheduled for:  4 weeks

## 2024-11-16 NOTE — Patient Outreach (Signed)
 Complex Care Management   Visit Note  11/16/2024  Name:  Cassidy Bruce MRN: [id number] DOB: 05-18-1996  Situation: Referral received for Complex Care Management related to Mental/Behavioral Health diagnosis Anxiety and Depression I obtained verbal consent from Patient.  Visit completed with Patient  on the phone  Background:   Past Medical History:  Diagnosis Date   ADHD    Depression    Stroke (HCC) 07/05/2024    Assessment: Patient Reported Symptoms:  Cognitive Cognitive Status: No symptoms reported, Alert and oriented to person, place, and time, Normal speech and language skills Cognitive/Intellectual Conditions Management [RPT]: None reported or documented in medical history or problem list   Health Maintenance Behaviors: Annual physical exam  Neurological Neurological Review of Symptoms: No symptoms reported    HEENT HEENT Symptoms Reported: Not assessed      Cardiovascular Cardiovascular Symptoms Reported: Not assessed    Respiratory Respiratory Symptoms Reported: Not assesed    Endocrine Endocrine Symptoms Reported: No symptoms reported    Gastrointestinal Gastrointestinal Symptoms Reported: Not assessed      Genitourinary Genitourinary Symptoms Reported: Not assessed    Integumentary Integumentary Symptoms Reported: No symptoms reported    Musculoskeletal Musculoskelatal Symptoms Reviewed: Not assessed        Psychosocial Psychosocial Symptoms Reported: Avoiding people, places, activities, Sadness - if selected complete PHQ 2-9 Behavioral Management Strategies: Adequate rest, Coping strategies, Medication therapy, Support system Behavioral Health Comment: Pt reports her anxiety symptoms are managed well at this time. Endorsed withdrawn behavior and sadness triggered by chronic health conditions and psychosocial stressors. Strategies discussed to assist Major Change/Loss/Stressor/Fears (CP): Medical condition, self Techniques to Cope with Loss/Stress/Change:  Diversional activities, Medication Quality of Family Relationships: involved, helpful, stressful    11/16/2024    PHQ2-9 Depression Screening   Little interest or pleasure in doing things    Feeling down, depressed, or hopeless    PHQ-2 - Total Score    Trouble falling or staying asleep, or sleeping too much    Feeling tired or having little energy    Poor appetite or overeating     Feeling bad about yourself - or that you are a failure or have let yourself or your family down    Trouble concentrating on things, such as reading the newspaper or watching television    Moving or speaking so slowly that other people could have noticed.  Or the opposite - being so fidgety or restless that you have been moving around a lot more than usual    Thoughts that you would be better off dead, or hurting yourself in some way    PHQ2-9 Total Score    If you checked off any problems, how difficult have these problems made it for you to do your work, take care of things at home, or get along with other people    Depression Interventions/Treatment      There were no vitals filed for this visit.    Medications Reviewed Today     Reviewed by Gerlad Pelzel D, LCSW (Social Worker) on 11/16/24 at 1306  Med List Status: <None>   Medication Order Taking? Sig Documenting Provider Last Dose Status Informant  acetaminophen  (TYLENOL ) 500 MG tablet [id number] Yes Take 500 mg by mouth every 6 (six) hours as needed. [provider]  Active Self  aspirin  EC 81 MG tablet [id number] Yes Take 1 tablet (81 mg total) by mouth daily. Swallow whole. Samtani, Jai-Gurmukh, MD  Active   butalbital -acetaminophen -caffeine  (FIORICET ) 50-325-40 MG tablet [id number]  Yes Take 1 tablet by mouth every 8 (eight) hours as needed for headache (if not effective with tylenol ). Debby Fidela CROME, NP  Active   citalopram  (CELEXA ) 20 MG tablet [id number] Yes Take 1 tablet (20 mg total) by mouth daily. [name], NP  Active    escitalopram  (LEXAPRO ) 5 MG tablet [id number] Yes Take 1 tablet (5 mg total) by mouth daily. [name], NP  Active   ezetimibe  (ZETIA ) 10 MG tablet [id number] Yes Take 1 tablet (10 mg total) by mouth daily. Thukkani, Arun K, MD  Active   ferrous sulfate  325 (65 FE) MG tablet [id number] Yes Take 1 tablet (325 mg total) by mouth every other day. Debby Fidela CROME, NP  Active   oxyCODONE  (OXY IR/ROXICODONE ) 5 MG immediate release tablet [id number] Yes Take 1 tablet (5 mg total) by mouth every 4 (four) hours as needed for breakthrough pain. Pegge Toribio PARAS, PA-C  Active            Med Note ANICE, ROBBIN M   Tue Oct 25, 2024  3:11 PM) Not taken maybe 3 weeks. NONE ON HAND  rosuvastatin  (CRESTOR ) 40 MG tablet [id number] Yes Take 1 tablet (40 mg total) by mouth daily. Thukkani, Arun K, MD  Active   Vitamin D , Ergocalciferol , (DRISDOL ) 1.25 MG (50000 UNIT) CAPS capsule [id number] Yes Take 1 capsule (50,000 Units total) by mouth every 7 (seven) days. Pegge Toribio PARAS, PA-C  Active   Med List Note Jettie Nichole LABOR, RN 10/14/24 1401): Patient reports that she was not home.  I was unable to complete Med Rec. Today.              Recommendation:   Continue Current Plan of Care  Follow Up Plan:   Telephone follow-up in 1 month  Rolin Kerns, LCSW [facility]  [facility], [facility] Clinical Social Worker Direct Dial: [phone]  Fax: [phone] Website: [email or web address] 2:12 PM

## 2024-11-17 ENCOUNTER — Encounter: Payer: Self-pay | Admitting: Physical Therapy

## 2024-11-17 ENCOUNTER — Ambulatory Visit: Admitting: Physical Therapy

## 2024-11-17 DIAGNOSIS — R2681 Unsteadiness on feet: Secondary | ICD-10-CM

## 2024-11-17 DIAGNOSIS — R278 Other lack of coordination: Secondary | ICD-10-CM | POA: Diagnosis not present

## 2024-11-17 DIAGNOSIS — M6281 Muscle weakness (generalized): Secondary | ICD-10-CM

## 2024-11-17 DIAGNOSIS — I69354 Hemiplegia and hemiparesis following cerebral infarction affecting left non-dominant side: Secondary | ICD-10-CM

## 2024-11-17 DIAGNOSIS — R208 Other disturbances of skin sensation: Secondary | ICD-10-CM

## 2024-11-17 DIAGNOSIS — R29898 Other symptoms and signs involving the musculoskeletal system: Secondary | ICD-10-CM

## 2024-11-17 NOTE — Therapy (Signed)
 " OUTPATIENT PHYSICAL THERAPY NEURO TREATMENT    Patient Name: Cassidy Bruce MRN: [id number] DOB:05-23-1996, 29 y.o., female Today's Date: 11/17/2024   PCP: Greig JINNY Drones, NP REFERRING PROVIDER: Carilyn Prentice BRAVO, MD  END OF SESSION:  PT End of Session - 11/17/24 1101     Visit Number 9    Number of Visits 17   9 + 8   Date for Recertification  12/16/24   pushed out due to multi-D scheduling and aquatic needs/holiday scheduling   Authorization Type [place] Medicaid    PT Start Time 1100    PT Stop Time 1146    PT Time Calculation (min) 46 min    Equipment Utilized During Treatment Other (comment)   aquatic devices as needed for safety and challenge   Activity Tolerance Patient tolerated treatment well    Behavior During Therapy [facility] for tasks assessed/performed          Past Medical History:  Diagnosis Date   ADHD    Depression    Stroke (HCC) 07/05/2024   Past Surgical History:  Procedure Laterality Date   TRANSESOPHAGEAL ECHOCARDIOGRAM (CATH LAB) N/A 07/07/2024   Procedure: TRANSESOPHAGEAL ECHOCARDIOGRAM;  Surgeon: Lonni Slain, MD;  Location: [facility] INVASIVE CV LAB;  Service: Cardiovascular;  Laterality: N/A;   Patient Active Problem List   Diagnosis Date Noted   Severe episode of recurrent major depressive disorder, without psychotic features (HCC) 07/15/2024   Panic disorder with agoraphobia and moderate panic attacks 07/15/2024   Thromboembolic stroke (HCC) 07/13/2024   PFO (patent foramen ovale) 07/07/2024   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   Hypokalemia 07/06/2024   Stroke (HCC) 07/05/2024   MDD (major depressive disorder), recurrent episode, moderate (HCC) 07/25/2018   Adjustment disorder with mixed anxiety and depressed mood     ONSET DATE: 07/05/2024 (CVA)  REFERRING DIAG: I63.9 (ICD-10-CM) - Cerebral infarction, unspecified  THERAPY DIAG:  Other lack of coordination  Muscle weakness (generalized)  Hemiplegia and hemiparesis following  cerebral infarction affecting left non-dominant side (HCC)  Other symptoms and signs involving the musculoskeletal system  Other disturbances of skin sensation  Unsteadiness on feet  Rationale for Evaluation and Treatment: Rehabilitation  SUBJECTIVE:                                                                                                                                                                                             SUBJECTIVE STATEMENT: Pt presents ambulatory independently, no AD, to [facility] facility.  No falls or acute changes.  Pt accompanied by: family member Midwife)  PERTINENT HISTORY: MDD, CVA, PFO, panic disorder w/ agoraphobia and moderate panic attacks  Per inpt rehab discharge summary: Presented 07/05/2024 with dizziness slurred speech headache and gait abnormality with left-sided weakness. Per family she had went to the gym on Monday, 07/04/2024 for weightlifting as usual no specific problems. Tuesday morning she awoke from sleep with headache and neck pain and by the afternoon patient with increasing dizziness vertigo as well as slurred speech. Cranial CT scan showed no acute intracranial abnormalities new right cerebellar infarct since prior study of 2021 felt to be possibly chronic. 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 Distal V2 segment reconstitution but with attenuated enhancement relative to the contralateral side along the remainder of its course. A 13 mm region of ischemia affecting both cerebral hemispheres worse on the left with no more core infarct. MRI identified acute infarcts within the cerebellar vermis and bilateral cerebellar hemispheres. Most notably large acute infarct present within the superior cerebellar artery territories bilaterally. Posterior fossa mass effect without cerebellar tonsillar herniation or evidence of obstructive hydrocephalus. Petechial hemorrhage within the cerebellar vermis and superior right cerebellar hemisphere.  Patient did not receive TNK. No thrombectomy needed for left VA occlusion as BA was patent.  PAIN:  Are you having pain? No  PRECAUTIONS: Fall and Other: Zio heart monitor  RED FLAGS: None   WEIGHT BEARING RESTRICTIONS: No  FALLS: Has patient fallen in last 6 months? No and day of CVA she slid to the floor to lay down  LIVING ENVIRONMENT: Lives with: lives alone Lives in: Transitional housing - hotel Stairs: No - mostly uses elevator but will walk down the stairs Has following equipment at home: shower chair and elevator and triangle rollator  PLOF: Independent - she has been trying to navigate crowds (went to [employer] and [employer])  PATIENT GOALS: my balance  OBJECTIVE:  Note: Objective measures were completed at Evaluation unless otherwise noted.  DIAGNOSTIC FINDINGS:  Brain MRI 07/11/2024: IMPRESSION: 1. Evolving early subacute bilateral cerebellar infarcts, left slightly worse than right. Possible mild interval expansion versus changes of Wallerian degeneration (favored) involving the midbrain since previous. Associated petechial hemorrhage without frank hemorrhagic transformation. 2. Loss of normal flow void within the left vertebral artery, consistent with previously identified dissection. 3. Otherwise stable and normal brain MRI.  COGNITION: Overall cognitive status: Impaired and delayed processing and speech impairments/slurred speech   SENSATION: Light touch: WFL  COORDINATION: BLE RAMS:  LLE fatigues and slows with prolonged task Heel-to-shin:  mildly dysmetric LLE  EDEMA:  None noted in BLE  MUSCLE TONE: None noted in LLE  POSTURE: forward head - very mild  LOWER EXTREMITY ROM:     Active  Right Eval Left Eval  Hip flexion Grossly WNL  Hip extension   Hip abduction   Hip adduction   Hip internal rotation   Hip external rotation   Knee flexion   Knee extension   Ankle dorsiflexion   Ankle plantarflexion    Ankle inversion     Ankle eversion     (Blank rows = not tested)  LOWER EXTREMITY MMT:    MMT Right Eval Left Eval  Hip flexion 5 4+  Hip extension    Hip abduction 4+ 4  Hip adduction    Hip internal rotation    Hip external rotation    Knee flexion 5 4+  Knee extension 5 5  Ankle dorsiflexion 5 5  Ankle plantarflexion    Ankle inversion    Ankle eversion    (Blank rows = not tested)  BED MOBILITY:  Findings: Sit to supine  Complete Independence Supine to sit Complete Independence Rolling to Right Complete Independence Rolling to Left Complete Independence  TRANSFERS: Sit to stand: Complete Independence  Assistive device utilized: None     Stand to sit: Complete Independence  Assistive device utilized: None     Chair to chair: SBA  Assistive device utilized: None       RAMP:  Not tested  CURB:  Not tested  STAIRS: Not tested GAIT: Findings: Gait Characteristics: step through pattern, decreased arm swing- Left, decreased stride length, and genu recurvatum- Left, Distance walked: various clinic distances, Assistive device utilized:None, Level of assistance: SBA and CGA, and Comments: mild lateral left knee thrust in stance  FUNCTIONAL TESTS:  5 times sit to stand: 14.54 sec no UE support 10 meter walk test: 12.94 sec no AD CGA = 0.77 m/sec OR 2.55 ft/sec Functional gait assessment:  FUNCTIONAL GAIT ASSESSMENT  Date: 08/19/2024 Score  GAIT LEVEL SURFACE Instructions: Walk at your normal speed from here to the next mark (6 m) [20 ft]. (2) Mild impairment - Walks 6 m (20 ft) in less than 7 seconds but greater than 5.5 seconds, uses assistive device, slower speed, mild gait deviations, or deviates 15.24 -25.4 cm (6 -10 in) outside of the 30.48-cm (12-in) walkway width.  2.   CHANGE IN GAIT SPEED Instructions: Begin walking at your normal pace (for 1.5 m [5 ft]). When I tell you go, walk as fast as you can (for 1.5 m [5 ft]). When I tell you slow, walk as slowly as you can (for 1.5 m  [5 ft]. (3) Normal - Able to smoothly change walking speed without loss of balance or gait deviation. Shows a significant difference in walking speeds between normal, fast, and slow speeds. Deviates no more than 15.24 cm (6 in) outside of the 30.48-cm (12-in) walkway width.  3.    GAIT WITH HORIZONTAL HEAD TURNS Instructions: Walk from here to the next mark 6 m (20 ft) away. Begin walking at your normal pace. Keep walking straight; after 3 steps, turn your head to the right and keep walking straight while looking to the right. After 3 more steps, turn your head to the left and keep walking straight while looking left. Continue alternating looking right and left. (2) Mild impairment - Performs head turns smoothly with slight change in gait velocity (eg, minor disruption to smooth gait path), deviates 15.24 -25.4 cm (6 -10 in) outside 30.48-cm (12-in) walkway width, or uses an assistive device.  4.   GAIT WITH VERTICAL HEAD TURNS Instructions: Walk from here to the next mark (6 m [20 ft]). Begin walking at your normal pace. Keep walking straight; after 3 steps, tip your head up and keep walking straight while looking up. After 3 more steps, tip your head down, keep walking straight while looking down. Continue  alternating looking up and down every 3 steps until you have completed 2 repetitions in each direction. (3) Normal - Performs head turns with no change in gait. Deviates no more than 15.24 cm (6 in) outside 30.48-cm (12-in) walkway width.  5.  GAIT AND PIVOT TURN Instructions: Begin with walking at your normal pace. When I tell you, turn and stop, turn as quickly as you can to face the opposite direction and stop. (1) Moderate impairment - Turns slowly, requires verbal cueing, or requires several small steps to catch balance following turn and stop  6.   STEP OVER OBSTACLE Instructions: Begin walking at your normal speed. When you come  to the shoe box, step over it, not around it, and keep walking.  (1) Moderate impairment - Is able to step over one shoe box (11.43 cm [4.5 in] total height) but must slow down and adjust steps to clear box safely. May require verbal cueing.  7.   GAIT WITH NARROW BASE OF SUPPORT Instructions: Walk on the floor with arms folded across the chest, feet aligned heel to toe in tandem for a distance of 3.6 m [12 ft]. The number of steps taken in a straight line are counted for a maximum of 10 steps. (2) Mild impairment - Ambulates 7-9 steps  8.   GAIT WITH EYES CLOSED Instructions: Walk at your normal speed from here to the next mark (6 m [20 ft]) with your eyes closed. (2) Mild impairment - Walks 6 m (20 ft), uses assistive device, slower speed, mild gait deviations, deviates 15.24 -25.4 cm (6 -10 in) outside 30.48-cm (12-in) walkway width. Ambulates 6 m (20 ft) in less than 9 seconds but greater than 7 seconds  9.   AMBULATING BACKWARDS Instructions: Walk backwards until I tell you to stop (2) Mild impairment - Walks 6 m (20 ft), uses assistive device, slower speed, mild gait deviations, deviates 15.24 -25.4 cm (6 -10 in) outside 30.48-cm (12-in) walkway width  10. STEPS Instructions: Walk up these stairs as you would at home (ie, using the rail if necessary). At the top turn around and walk down. (3) Normal-Alternating feet, no rail.  Total 21/30   Interpretation of scores: Non-Specific Older Adults Cutoff Score: <=22/30 = risk of falls Parkinsons Disease Cutoff score <15/30= fall risk (Hoehn & Yahr 1-4)  Minimally Clinically Important Difference (MCID)  Stroke (acute, subacute, and chronic) = MDC: 4.2 points Vestibular (acute) = MDC: 6 points Community Dwelling Older Adults =  MCID: 4 points Parkinsons Disease  =  MDC: 4.3 points  (Academy of Neurologic Physical Therapy (nd). Functional Gait Assessment. Retrieved from  https://www.neuropt.org/docs/default-source/cpgs/core-outcome-measures/function-gait-assessment-pocket-guide-proof9-(2).pdf?sfvrsn=b46f35043_0.)  PATIENT SURVEYS:  None relevant to chief complaint and age range.                                                                                                                              TREATMENT DATE: 11/17/2024  -Water  walking warmup 4x18 ft forward > backward > laterally using large kickboard resistance -SL STS 2x10 each LE - using moderate resistance DB for LUE support when in L SLS  Ai Chi:  -Postures 1-8  -10 reps (each side when applicable)  -Cues for form and core engagement  PATIENT EDUCATION: Education details: Aquatic rationale. Education method: Explanation, Demonstration, Verbal cues, and Handouts Education comprehension: verbalized understanding and needs further education  HOME EXERCISE PROGRAM: Access Code: [email or web address] URL: [email or web address] Date: 08/19/2024 Prepared by: Daved Bull  Exercises - Seated Hamstring Curls with Resistance  - 1 x daily - 7 x weekly - 2 sets - 10 reps - Standing Hamstring Curl with Resistance  - 1 x  daily - 7 x weekly - 2 sets - 10 reps - Squat with Chair Touch and Resistance Loop  - 1 x daily - 5 x weekly - 2 sets - 12 reps - Side Stepping with Resistance at Thighs  - 1 x daily - 5 x weekly - 3 sets - 10 reps - Modified Single-Leg Deadlift  - 1 x daily - 5 x weekly - 2 sets - 10 reps - Walking Step Over  - 1 x daily - 5 x weekly - 3 sets - 10 reps -Retro stepping - Standing Terminal Knee Extension with Resistance  - 1 x daily - 5 x weekly - 2-3 sets - 10 reps  GOALS: Goals reviewed with patient? Yes  SHORT TERM GOALS: Target date: 09/16/2024  Pt will be independent and compliant with introductory strength and balance focused HEP in order to maintain functional progress and improve mobility. Baseline:  Established on eval; progressed 11/24 Goal status: IN  PROGRESS  2.  Pt will be assessed for most appropriate hyperextension management w/ edu on appropriate wear or use. Baseline: Discussed bracing vs taping options on eval; holding on knee cage as pt thinks it over (11/24) Goal status: IN PROGRESS  3.  Pt will initiate aquatic therapy for improved balance management and strengthening. Baseline: To be scheduled; pt actively in aquatics (11/24) Goal status: MET  4.  Pt will decrease 5xSTS to </=12 seconds w/o UE support in order to demonstrate decreased risk for falls and improved functional bilateral LE strength and power. Baseline: 14.54 sec no UE support; 10.59 sec no UE support (11/24) Goal status: MET  5.  Pt will demonstrate a gait speed of >/=2.75 feet/sec in order to decrease risk for falls. Baseline: 2.55 ft/sec no AD SBA; 3.33 ft/sec no AD SBA (11/24) Goal status: MET  LONG TERM GOALS: Target date: 10/14/2024  Pt will be independent and compliant with advanced and finalized strength and balance focused HEP in order to maintain functional progress and improve mobility. Baseline: IND and compliant (12/23) Goal status: MET  2.  Pt will improve FGA score to >/=26/30 in order to demonstrate improved balance and decreased fall risk. Baseline: 21/30; 22/30 (12/23) Goal status: IN PROGRESS  3.  Pt will demonstrate a gait speed of >/=3.53 feet/sec in order to decrease risk for falls. Baseline: 2.55 ft/sec no AD SBA; 3.33 ft/sec no AD SBA (11/24); 2.82 ft/sec no AD CGA (12/23) Goal status: NOT MET  4.  Pt will ambulate >/=500 feet without AD independently over level and unlevel surfaces using appropriate knee control in order to promote household and community access. Baseline: ambulates w/ knee hyperextension no AD limited community distances SBA-CGA; 500 ft SBA no AD - ongoing hyperextension, mild left drift w/ distance (12/23) Goal status: IN PROGRESS   SHORT TERM GOALS = LONG TERM GOALS: Target date: 12/09/2024 (to match final  appt)  1.  Pt will improve FGA score to >/=26/30 in order to demonstrate improved balance and decreased fall risk. Baseline: 21/30; 22/30 (12/23) Goal status: ONGOING  2.  Pt will demonstrate a gait speed of >/=3.53 feet/sec in order to decrease risk for falls. Baseline: 2.55 ft/sec no AD SBA; 3.33 ft/sec no AD SBA (11/24); 2.82 ft/sec no AD CGA (12/23) Goal status: ONGOING  3.  Pt will ambulate >/=500 feet without AD independently over level and unlevel surfaces using appropriate knee control in order to promote household and community access. Baseline: ambulates w/ knee hyperextension no AD limited community distances  SBA-CGA; 500 ft SBA no AD - ongoing hyperextension, mild left drift w/ distance (12/23) Goal status: ONGOING  ASSESSMENT:  CLINICAL IMPRESSION: Focus of aquatic PT session today on continued isolated LLE stance in variable positions to challenge posterior chain, core engagement, and static balance.  She is able to progress away from UE support for isolated SL STS on LLE w/ improved pelvic and lower core control.  Completed Ai Chi postures 1-8 with improved upright posture as repetitions increased.  She needs increased time to stabilize on LLE.  Will continue per POC    OBJECTIVE IMPAIRMENTS: Abnormal gait, decreased activity tolerance, decreased balance, decreased coordination, decreased knowledge of use of DME, decreased strength, and improper body mechanics.   ACTIVITY LIMITATIONS: lifting, standing, squatting, transfers, and locomotion level  PARTICIPATION LIMITATIONS: driving, shopping, community activity, and occupation  PERSONAL FACTORS: Past/current experiences and 1-2 comorbidities: MDD/panic disorder are also affecting patient's functional outcome.   REHAB POTENTIAL: Excellent  CLINICAL DECISION MAKING: Evolving/moderate complexity  EVALUATION COMPLEXITY: Moderate  PLAN:  PT FREQUENCY: 1x/week + 2x/wk  PT DURATION: 8 weeks + 5 wks   PLANNED  INTERVENTIONS: [id number]- PT Re-evaluation, 97750- Physical Performance Testing, 97110-Therapeutic exercises, 97530- Therapeutic activity, [id number]- Neuromuscular re-education, 97535- Self Care, [id number]- Manual therapy, [id number]- Gait training, [phone]- Orthotic Initial, [phone]- Orthotic/Prosthetic subsequent, [phone]- Aquatic Therapy, [phone]- Electrical stimulation (manual), Patient/Family education, Balance training, Stair training, Taping, Joint mobilization, Vestibular training, and DME instructions  PLAN FOR NEXT SESSION: Expand HEP prn, SLS and high level stability, treadmill training, spanish squats, hamstring strengthening - deadlift variations; pursue knee cage order at re-cert if pt desires - pt interested in AFO on 12/18 reporting it helps her balance more - will reassess. Aquatics:  Walking mechanics, SL elevated STS, core; NO AQUATIC HEP - no pool access;  Ai Chi postures 9-16  Check all possible CPT codes: See Planned Interventions List for Planned CPT Codes    Check all conditions that are expected to impact treatment: Neurological condition and/or seizures, Psychological or psychiatric disorders, and Social determinants of health   If treatment provided at initial evaluation, no treatment charged due to lack of authorization.    Daved KATHEE Bull, PT, DPT 11/17/2024, 11:45 AM        "

## 2024-11-18 ENCOUNTER — Other Ambulatory Visit: Payer: Self-pay | Admitting: *Deleted

## 2024-11-18 ENCOUNTER — Other Ambulatory Visit: Payer: Self-pay

## 2024-11-18 NOTE — Patient Instructions (Signed)
 Visit Information  Cassidy Bruce was given information about Medicaid Managed Care team care coordination services as a part of their [facility] Medicaid benefit.   If you would like to schedule transportation through your [facility], please call the following number at least 2 days in advance of your appointment: [phone]   Rides for urgent appointments can also be made after hours by calling Member Services.  Call the Behavioral Health Crisis Line at [phone], at any time, 24 hours a day, 7 days a week. If you are in danger or need immediate medical attention call 911.  Please see education materials related to Stroke provided by MyChart link.  Care plan and visit instructions communicated with the patient verbally today. Patient agrees to receive a copy in MyChart. Active MyChart status and patient understanding of how to access instructions and care plan via MyChart confirmed with patient.     Telephone follow up appointment with Managed Medicaid care management team member scheduled for: 01/06/25 @ 3 pm.  Nayra Coury, RN, BSN, ACM RN Care Manager [facility] [phone]   Following is a copy of your plan of care:   Goals Addressed             This Visit's Progress    VBCI RN Care Plan-CVA   On track    Problems:  Care Coordination needs related to Depression   and Financial Strain  Chronic Disease Management support and education needs related to Depression and CVA.  Goal: Over the next 90 days the Patient will attend all scheduled medical appointments: PCP and Specialist as evidenced by keeping all schedule appointments.        continue to work with Medical Illustrator and/or Social Worker to address care management and care coordination needs related to CVA as evidenced by adherence to care management team scheduled appointments     take all medications exactly as prescribed and will call provider for medication  related questions as evidenced by compliance.    verbalize basic understanding of CVA disease process and self health management plan as evidenced by verbal explanation,  recognize and monitor symptoms and life style changes.   work with child psychotherapist to Sports Coach constraints related to stress of medical condition and Mental Health Concerns  related to the management of CVA as evidenced by review of electronic medical record and patient or social worker report      Interventions:   Evaluation of current treatment plan related to CVA, Financial constraints related to stress of health condition and Mental Health Concerns  self-management and patient's adherence to plan as established by provider. Discussed plans with patient for ongoing care management follow up and provided patient with direct contact information for care management team Evaluation of current treatment plan related to CVA and patient's adherence to plan as established by provider Provided education to patient and/or caregiver about advanced directives Social Work referral for Financial strain and Depression Discussed plans with patient for ongoing care management follow up and provided patient with direct contact information for care management team Screening for signs and symptoms of depression related to chronic disease state  Assessed social determinant of health barriers  Patient Self-Care Activities:  Attend all scheduled provider appointments Attend church or other social activities Call pharmacy for medication refills 3-7 days in advance of running out of medications Call provider office for new concerns or questions  Take medications as prescribed   Work with the social  worker to address care coordination needs and will continue to work with the clinical team to address health care and disease management related needs Follow up on resources provided by Social worker Continue to bath and dress yourself as much as  possible and ask for assistance when needed.   Sit upright during meals.   Chew slow and take small bites of food.   Use can for mobility as needed.   Continue to work with PT/OT to improve mobility.  Plan:  Telephone follow up appointment with care management team member scheduled for:  01/06/25 @ 3 pm.

## 2024-11-18 NOTE — Patient Outreach (Signed)
 Complex Care Management   Visit Note  11/18/2024  Name:  Cassidy Bruce MRN: [id number] DOB: June 26, 1996  Situation: Referral received for Complex Care Management related to CVA I obtained verbal consent from Patient.  Visit completed with Patient  on the phone  Background:   Past Medical History:  Diagnosis Date   ADHD    Depression    Stroke (HCC) 07/05/2024    Assessment: Patient Reported Symptoms:  Cognitive Cognitive Status: No symptoms reported, Alert and oriented to person, place, and time, Normal speech and language skills Cognitive/Intellectual Conditions Management [RPT]: None reported or documented in medical history or problem list   Health Maintenance Behaviors: Annual physical exam, Sleep adequate, Healthy diet, Social activities, Stress management Healing Pattern: Average Health Facilitated by: Healthy diet, Stress management, Pain control, Prayer/meditation  Neurological Neurological Review of Symptoms: No symptoms reported Neurological Management Strategies: Adequate rest, Medication therapy, Routine screening Neurological Self-Management Outcome: 4 (good)  HEENT HEENT Symptoms Reported: No symptoms reported HEENT Management Strategies: Adequate rest, Routine screening HEENT Self-Management Outcome: 4 (good)    Cardiovascular Cardiovascular Symptoms Reported: No symptoms reported Cardiovascular Management Strategies: Adequate rest, Routine screening Cardiovascular Self-Management Outcome: 4 (good)  Respiratory Respiratory Symptoms Reported: No symptoms reported Respiratory Management Strategies: Adequate rest, Routine screening Respiratory Self-Management Outcome: 4 (good)  Endocrine Endocrine Symptoms Reported: No symptoms reported Is patient diabetic?: No Endocrine Self-Management Outcome: 4 (good)  Gastrointestinal Gastrointestinal Symptoms Reported: No symptoms reported Gastrointestinal Management Strategies: Adequate rest Gastrointestinal  Self-Management Outcome: 4 (good)    Genitourinary Genitourinary Symptoms Reported: No symptoms reported Genitourinary Management Strategies: Adequate rest Genitourinary Self-Management Outcome: 4 (good)  Integumentary Integumentary Symptoms Reported: No symptoms reported Skin Management Strategies: Adequate rest, Coping strategies, Routine screening Skin Self-Management Outcome: 4 (good)  Musculoskeletal Musculoskelatal Symptoms Reviewed: No symptoms reported Additional Musculoskeletal Details: Patient with hx of CVA.  Patient reports that she uses her walker as needed to get around. Musculoskeletal Management Strategies: Adequate rest, Activity, Routine screening Musculoskeletal Self-Management Outcome: 4 (good) Falls in the past year?: No (Patient reports no falls since last outreach.) Number of falls in past year: 1 or less Was there an injury with Fall?: No Fall Risk Category Calculator: 0 Patient Fall Risk Level: Low Fall Risk Patient at Risk for Falls Due to: No Fall Risks Fall risk Follow up: Falls evaluation completed  Psychosocial Psychosocial Symptoms Reported: No symptoms reported Additional Psychological Details: Patient reports that her provider changed her depression medication a few days ago.  LCSW following patient. Behavioral Management Strategies: Coping strategies, Adequate rest, Medication therapy, Support system Behavioral Health Self-Management Outcome: 4 (good) Techniques to Cope with Loss/Stress/Change: Diversional activities, Medication Quality of Family Relationships: helpful, involved, supportive Do you feel physically threatened by others?: No    11/18/2024    PHQ2-9 Depression Screening   Little interest or pleasure in doing things Not at all  Feeling down, depressed, or hopeless Not at all  PHQ-2 - Total Score 0  Trouble falling or staying asleep, or sleeping too much    Feeling tired or having little energy    Poor appetite or overeating     Feeling  bad about yourself - or that you are a failure or have let yourself or your family down    Trouble concentrating on things, such as reading the newspaper or watching television    Moving or speaking so slowly that other people could have noticed.  Or the opposite - being so fidgety or restless that you have been moving around  a lot more than usual    Thoughts that you would be better off dead, or hurting yourself in some way    PHQ2-9 Total Score    If you checked off any problems, how difficult have these problems made it for you to do your work, take care of things at home, or get along with other people    Depression Interventions/Treatment      There were no vitals filed for this visit. Pain Scale: 0-10 Pain Score: 0-No pain  Medications Reviewed Today     Reviewed by Maison Kestenbaum A, RN (Case Manager) on 11/18/24 at 1248  Med List Status: <None>   Medication Order Taking? Sig Documenting Provider Last Dose Status Informant  acetaminophen  (TYLENOL ) 500 MG tablet [id number] Yes Take 500 mg by mouth every 6 (six) hours as needed. [provider]  Active Self  aspirin  EC 81 MG tablet [id number] Yes Take 1 tablet (81 mg total) by mouth daily. Swallow whole. Samtani, Jai-Gurmukh, MD  Active   butalbital -acetaminophen -caffeine  (FIORICET ) 50-325-40 MG tablet [id number] Yes Take 1 tablet by mouth every 8 (eight) hours as needed for headache (if not effective with tylenol ). Debby Fidela CROME, NP  Active   citalopram  (CELEXA ) 20 MG tablet [id number] Yes Take 1 tablet (20 mg total) by mouth daily. [name], NP  Active   escitalopram  (LEXAPRO ) 5 MG tablet [id number] Yes Take 1 tablet (5 mg total) by mouth daily. [name], NP  Active   ezetimibe  (ZETIA ) 10 MG tablet [id number] Yes Take 1 tablet (10 mg total) by mouth daily. Thukkani, Arun K, MD  Active   ferrous sulfate  325 (65 FE) MG tablet [id number] Yes Take 1 tablet (325 mg total) by mouth every other day. Debby Fidela CROME, NP  Active    oxyCODONE  (OXY IR/ROXICODONE ) 5 MG immediate release tablet [id number]  Take 1 tablet (5 mg total) by mouth every 4 (four) hours as needed for breakthrough pain.  Patient not taking: Reported on 11/18/2024   Pegge Toribio PARAS, PA-C  Active            Med Note ANICE, ROBBIN M   Tue Oct 25, 2024  3:11 PM) Not taken maybe 3 weeks. NONE ON HAND  rosuvastatin  (CRESTOR ) 40 MG tablet [id number] Yes Take 1 tablet (40 mg total) by mouth daily. Thukkani, Arun K, MD  Active   Vitamin D , Ergocalciferol , (DRISDOL ) 1.25 MG (50000 UNIT) CAPS capsule [id number] Yes Take 1 capsule (50,000 Units total) by mouth every 7 (seven) days. Pegge Toribio PARAS, PA-C  Active   Med List Note Jettie Nichole LABOR, RN 10/14/24 1401): Patient reports that she was not home.  I was unable to complete Med Rec. Today.              Recommendation:   PCP Follow-up Specialty provider follow-up : Cardiology-12/02/24; Oncology-12/07/24;  Continue Current Plan of Care  Follow Up Plan:   Telephone follow-up in 1 month: 01/06/25 @ 3 pm.    Argenis Kumari, RN, BSN, ACM RN Care Manager [employer] [phone]

## 2024-11-22 ENCOUNTER — Encounter: Payer: Self-pay | Admitting: Physical Therapy

## 2024-11-22 ENCOUNTER — Telehealth: Payer: Self-pay | Admitting: Physical Therapy

## 2024-11-22 ENCOUNTER — Ambulatory Visit: Admitting: Physical Therapy

## 2024-11-22 ENCOUNTER — Encounter: Payer: Self-pay | Admitting: Physical Medicine & Rehabilitation

## 2024-11-22 DIAGNOSIS — R278 Other lack of coordination: Secondary | ICD-10-CM

## 2024-11-22 DIAGNOSIS — I69354 Hemiplegia and hemiparesis following cerebral infarction affecting left non-dominant side: Secondary | ICD-10-CM

## 2024-11-22 DIAGNOSIS — R208 Other disturbances of skin sensation: Secondary | ICD-10-CM

## 2024-11-22 DIAGNOSIS — R2681 Unsteadiness on feet: Secondary | ICD-10-CM

## 2024-11-22 DIAGNOSIS — R29898 Other symptoms and signs involving the musculoskeletal system: Secondary | ICD-10-CM

## 2024-11-22 DIAGNOSIS — M6281 Muscle weakness (generalized): Secondary | ICD-10-CM

## 2024-11-22 NOTE — Telephone Encounter (Signed)
 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  The patient would benefit from left knee cage due to residual hyperextension thrust post-CVA.   If you agree, please place an order in [facility] workque in [facility] or fax the order to [phone].  Thank you,  Daved Bull, PT, DPT  [facility] [address] [place], [place]  [address] Phone:  [phone] Fax:  [phone]

## 2024-11-22 NOTE — Therapy (Signed)
 " OUTPATIENT PHYSICAL THERAPY NEURO TREATMENT    Patient Name: Cassidy Bruce MRN: [id number] DOB:[birth year], 29 y.o., female Today's Date: 11/22/2024   PCP: Greig JINNY Drones, NP REFERRING PROVIDER: Carilyn Prentice BRAVO, MD  END OF SESSION:  PT End of Session - 11/22/24 1408     Visit Number 10    Number of Visits 17   9 + 8   Date for Recertification  12/16/24   pushed out due to multi-D scheduling and aquatic needs/holiday scheduling   Authorization Type [place] Medicaid    PT Start Time 1402    PT Stop Time 1445    PT Time Calculation (min) 43 min    Equipment Utilized During Treatment Gait belt    Activity Tolerance Patient tolerated treatment well    Behavior During Therapy [facility] for tasks assessed/performed          Past Medical History:  Diagnosis Date   ADHD    Depression    Stroke (HCC) 07/05/2024   Past Surgical History:  Procedure Laterality Date   TRANSESOPHAGEAL ECHOCARDIOGRAM (CATH LAB) N/A 07/07/2024   Procedure: TRANSESOPHAGEAL ECHOCARDIOGRAM;  Surgeon: Lonni Slain, MD;  Location: [facility] INVASIVE CV LAB;  Service: Cardiovascular;  Laterality: N/A;   Patient Active Problem List   Diagnosis Date Noted   Severe episode of recurrent major depressive disorder, without psychotic features (HCC) 07/15/2024   Panic disorder with agoraphobia and moderate panic attacks 07/15/2024   Thromboembolic stroke (HCC) 07/13/2024   PFO (patent foramen ovale) 07/07/2024   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   Hypokalemia 07/06/2024   Stroke (HCC) 07/05/2024   MDD (major depressive disorder), recurrent episode, moderate (HCC) 07/25/2018   Adjustment disorder with mixed anxiety and depressed mood     ONSET DATE: 07/05/2024 (CVA)  REFERRING DIAG: I63.9 (ICD-10-CM) - Cerebral infarction, unspecified  THERAPY DIAG:  Other lack of coordination  Muscle weakness (generalized)  Unsteadiness on feet  Hemiplegia and hemiparesis following cerebral infarction affecting left  non-dominant side (HCC)  Other symptoms and signs involving the musculoskeletal system  Other disturbances of skin sensation  Rationale for Evaluation and Treatment: Rehabilitation  SUBJECTIVE:                                                                                                                                                                                             SUBJECTIVE STATEMENT: Pt presents ambulatory independently, no AD, to clinic.  No falls or acute changes.  She reports thinking about it and wanting to do the knee cage over AFO.  She reports she stopped her plavix  but thinks there is a red pill she was supposed  to stop as well.  She needs to look back at her messages. Pt accompanied by: family member Midwife)  PERTINENT HISTORY: MDD, CVA, PFO, panic disorder w/ agoraphobia and moderate panic attacks  Per inpt rehab discharge summary: Presented 07/05/2024 with dizziness slurred speech headache and gait abnormality with left-sided weakness. Per family she had went to the gym on Monday, 07/04/2024 for weightlifting as usual no specific problems. Tuesday morning she awoke from sleep with headache and neck pain and by the afternoon patient with increasing dizziness vertigo as well as slurred speech. Cranial CT scan showed no acute intracranial abnormalities new right cerebellar infarct since prior study of 2021 felt to be possibly chronic. 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 Distal V2 segment reconstitution but with attenuated enhancement relative to the contralateral side along the remainder of its course. A 13 mm region of ischemia affecting both cerebral hemispheres worse on the left with no more core infarct. MRI identified acute infarcts within the cerebellar vermis and bilateral cerebellar hemispheres. Most notably large acute infarct present within the superior cerebellar artery territories bilaterally. Posterior fossa mass effect without cerebellar tonsillar  herniation or evidence of obstructive hydrocephalus. Petechial hemorrhage within the cerebellar vermis and superior right cerebellar hemisphere. Patient did not receive TNK. No thrombectomy needed for left VA occlusion as BA was patent.  PAIN:  Are you having pain? No  PRECAUTIONS: Fall and Other: Zio heart monitor  RED FLAGS: None   WEIGHT BEARING RESTRICTIONS: No  FALLS: Has patient fallen in last 6 months? No and day of CVA she slid to the floor to lay down  LIVING ENVIRONMENT: Lives with: lives alone Lives in: Transitional housing - hotel Stairs: No - mostly uses elevator but will walk down the stairs Has following equipment at home: shower chair and elevator and triangle rollator  PLOF: Independent - she has been trying to navigate crowds (went to [employer] and [employer])  PATIENT GOALS: my balance  OBJECTIVE:  Note: Objective measures were completed at Evaluation unless otherwise noted.  DIAGNOSTIC FINDINGS:  Brain MRI 07/11/2024: IMPRESSION: 1. Evolving early subacute bilateral cerebellar infarcts, left slightly worse than right. Possible mild interval expansion versus changes of Wallerian degeneration (favored) involving the midbrain since previous. Associated petechial hemorrhage without frank hemorrhagic transformation. 2. Loss of normal flow void within the left vertebral artery, consistent with previously identified dissection. 3. Otherwise stable and normal brain MRI.  COGNITION: Overall cognitive status: Impaired and delayed processing and speech impairments/slurred speech   SENSATION: Light touch: WFL  COORDINATION: BLE RAMS:  LLE fatigues and slows with prolonged task Heel-to-shin:  mildly dysmetric LLE  EDEMA:  None noted in BLE  MUSCLE TONE: None noted in LLE  POSTURE: forward head - very mild  LOWER EXTREMITY ROM:     Active  Right Eval Left Eval  Hip flexion Grossly WNL  Hip extension   Hip abduction   Hip adduction   Hip  internal rotation   Hip external rotation   Knee flexion   Knee extension   Ankle dorsiflexion   Ankle plantarflexion    Ankle inversion    Ankle eversion     (Blank rows = not tested)  LOWER EXTREMITY MMT:    MMT Right Eval Left Eval  Hip flexion 5 4+  Hip extension    Hip abduction 4+ 4  Hip adduction    Hip internal rotation    Hip external rotation    Knee flexion 5 4+  Knee extension 5 5  Ankle dorsiflexion 5 5  Ankle plantarflexion    Ankle inversion    Ankle eversion    (Blank rows = not tested)  BED MOBILITY:  Findings: Sit to supine Complete Independence Supine to sit Complete Independence Rolling to Right Complete Independence Rolling to Left Complete Independence  TRANSFERS: Sit to stand: Complete Independence  Assistive device utilized: None     Stand to sit: Complete Independence  Assistive device utilized: None     Chair to chair: SBA  Assistive device utilized: None       RAMP:  Not tested  CURB:  Not tested  STAIRS: Not tested GAIT: Findings: Gait Characteristics: step through pattern, decreased arm swing- Left, decreased stride length, and genu recurvatum- Left, Distance walked: various clinic distances, Assistive device utilized:None, Level of assistance: SBA and CGA, and Comments: mild lateral left knee thrust in stance  FUNCTIONAL TESTS:  5 times sit to stand: 14.54 sec no UE support 10 meter walk test: 12.94 sec no AD CGA = 0.77 m/sec OR 2.55 ft/sec Functional gait assessment:  FUNCTIONAL GAIT ASSESSMENT  Date: 08/19/2024 Score  GAIT LEVEL SURFACE Instructions: Walk at your normal speed from here to the next mark (6 m) [20 ft]. (2) Mild impairment - Walks 6 m (20 ft) in less than 7 seconds but greater than 5.5 seconds, uses assistive device, slower speed, mild gait deviations, or deviates 15.24 -25.4 cm (6 -10 in) outside of the 30.48-cm (12-in) walkway width.  2.   CHANGE IN GAIT SPEED Instructions: Begin walking at your normal pace  (for 1.5 m [5 ft]). When I tell you go, walk as fast as you can (for 1.5 m [5 ft]). When I tell you slow, walk as slowly as you can (for 1.5 m [5 ft]. (3) Normal - Able to smoothly change walking speed without loss of balance or gait deviation. Shows a significant difference in walking speeds between normal, fast, and slow speeds. Deviates no more than 15.24 cm (6 in) outside of the 30.48-cm (12-in) walkway width.  3.    GAIT WITH HORIZONTAL HEAD TURNS Instructions: Walk from here to the next mark 6 m (20 ft) away. Begin walking at your normal pace. Keep walking straight; after 3 steps, turn your head to the right and keep walking straight while looking to the right. After 3 more steps, turn your head to the left and keep walking straight while looking left. Continue alternating looking right and left. (2) Mild impairment - Performs head turns smoothly with slight change in gait velocity (eg, minor disruption to smooth gait path), deviates 15.24 -25.4 cm (6 -10 in) outside 30.48-cm (12-in) walkway width, or uses an assistive device.  4.   GAIT WITH VERTICAL HEAD TURNS Instructions: Walk from here to the next mark (6 m [20 ft]). Begin walking at your normal pace. Keep walking straight; after 3 steps, tip your head up and keep walking straight while looking up. After 3 more steps, tip your head down, keep walking straight while looking down. Continue  alternating looking up and down every 3 steps until you have completed 2 repetitions in each direction. (3) Normal - Performs head turns with no change in gait. Deviates no more than 15.24 cm (6 in) outside 30.48-cm (12-in) walkway width.  5.  GAIT AND PIVOT TURN Instructions: Begin with walking at your normal pace. When I tell you, turn and stop, turn as quickly as you can to face the opposite direction and stop. (1) Moderate impairment - Turns  slowly, requires verbal cueing, or requires several small steps to catch balance following turn and stop  6.    STEP OVER OBSTACLE Instructions: Begin walking at your normal speed. When you come to the shoe box, step over it, not around it, and keep walking. (1) Moderate impairment - Is able to step over one shoe box (11.43 cm [4.5 in] total height) but must slow down and adjust steps to clear box safely. May require verbal cueing.  7.   GAIT WITH NARROW BASE OF SUPPORT Instructions: Walk on the floor with arms folded across the chest, feet aligned heel to toe in tandem for a distance of 3.6 m [12 ft]. The number of steps taken in a straight line are counted for a maximum of 10 steps. (2) Mild impairment - Ambulates 7-9 steps  8.   GAIT WITH EYES CLOSED Instructions: Walk at your normal speed from here to the next mark (6 m [20 ft]) with your eyes closed. (2) Mild impairment - Walks 6 m (20 ft), uses assistive device, slower speed, mild gait deviations, deviates 15.24 -25.4 cm (6 -10 in) outside 30.48-cm (12-in) walkway width. Ambulates 6 m (20 ft) in less than 9 seconds but greater than 7 seconds  9.   AMBULATING BACKWARDS Instructions: Walk backwards until I tell you to stop (2) Mild impairment - Walks 6 m (20 ft), uses assistive device, slower speed, mild gait deviations, deviates 15.24 -25.4 cm (6 -10 in) outside 30.48-cm (12-in) walkway width  10. STEPS Instructions: Walk up these stairs as you would at home (ie, using the rail if necessary). At the top turn around and walk down. (3) Normal-Alternating feet, no rail.  Total 21/30   Interpretation of scores: Non-Specific Older Adults Cutoff Score: <=22/30 = risk of falls Parkinsons Disease Cutoff score <15/30= fall risk (Hoehn & Yahr 1-4)  Minimally Clinically Important Difference (MCID)  Stroke (acute, subacute, and chronic) = MDC: 4.2 points Vestibular (acute) = MDC: 6 points Community Dwelling Older Adults =  MCID: 4 points Parkinsons Disease  =  MDC: 4.3 points  (Academy of Neurologic Physical Therapy (nd). Functional Gait Assessment. Retrieved  from https://www.neuropt.org/docs/default-source/cpgs/core-outcome-measures/function-gait-assessment-pocket-guide-proof9-(2).pdf?sfvrsn=b60f35043_0.)  PATIENT SURVEYS:  None relevant to chief complaint and age range.                                                                                                                              TREATMENT DATE: 11/22/2024  -Reviewed instructions for procedure with pt to clarify medication instructions - requested she reach out to their team directly for more specific instructions if needed.  Printed medication list per pt request as she needs this for procedure. -Discussed knee cage vs AFO - pursuing order and potential OOP cost.  Benefits of knee cage for injury prevention and gait mechanics if proper fit (do not have right fit in clinic). -20lb KB RDL 2x15 -Heel elevated narrow stance squat 2x12; attempted w/ 10lb KB but pt reports pain  in quad tendon area that resolves when weight removed -Squat hold pallof press w/ blue theraband 2x15 -Y balance on LLE x10 each direction  PATIENT EDUCATION: Education details: Will pursue knee cage order.  Additions to HEP and ways to ease back into gym environment.  Gradual return to the gym - clear with cardiologist following procedure. Education method: Explanation, Demonstration, Verbal cues, and Handouts Education comprehension: verbalized understanding and needs further education  HOME EXERCISE PROGRAM: Access Code: [email or web address] URL: [email or web address] Date: 08/19/2024 Prepared by: Daved Bull  Exercises - Seated Hamstring Curls with Resistance  - 1 x daily - 7 x weekly - 2 sets - 10 reps - Standing Hamstring Curl with Resistance  - 1 x daily - 7 x weekly - 2 sets - 10 reps - Squat with Chair Touch and Resistance Loop  - 1 x daily - 5 x weekly - 2 sets - 12 reps - Side Stepping with Resistance at Thighs  - 1 x daily - 5 x weekly - 3 sets - 10 reps - Modified Single-Leg Deadlift  - 1  x daily - 5 x weekly - 2 sets - 10 reps - Walking Step Over  - 1 x daily - 5 x weekly - 3 sets - 10 reps -Retro stepping - Standing Terminal Knee Extension with Resistance  - 1 x daily - 5 x weekly - 2-3 sets - 10 reps - Squat on Decline Board  - 1 x daily - 5 x weekly - 2 sets - 10 reps - Lower Quarter Posteriolateral Reach  - 1 x daily - 5 x weekly - 2 sets - 10 reps - Lower Quarter Anterior Reach  - 1 x daily - 5 x weekly - 2 sets - 10 reps - Lower Quarter Posteriomedial Reach  - 1 x daily - 5 x weekly - 2 sets - 10 reps - Squatting Anti-Rotation Press  - 1 x daily - 5 x weekly - 2 sets - 10 reps  GOALS: Goals reviewed with patient? Yes  SHORT TERM GOALS: Target date: 09/16/2024  Pt will be independent and compliant with introductory strength and balance focused HEP in order to maintain functional progress and improve mobility. Baseline:  Established on eval; progressed 11/24 Goal status: IN PROGRESS  2.  Pt will be assessed for most appropriate hyperextension management w/ edu on appropriate wear or use. Baseline: Discussed bracing vs taping options on eval; holding on knee cage as pt thinks it over (11/24) Goal status: IN PROGRESS  3.  Pt will initiate aquatic therapy for improved balance management and strengthening. Baseline: To be scheduled; pt actively in aquatics (11/24) Goal status: MET  4.  Pt will decrease 5xSTS to </=12 seconds w/o UE support in order to demonstrate decreased risk for falls and improved functional bilateral LE strength and power. Baseline: 14.54 sec no UE support; 10.59 sec no UE support (11/24) Goal status: MET  5.  Pt will demonstrate a gait speed of >/=2.75 feet/sec in order to decrease risk for falls. Baseline: 2.55 ft/sec no AD SBA; 3.33 ft/sec no AD SBA (11/24) Goal status: MET  LONG TERM GOALS: Target date: 10/14/2024  Pt will be independent and compliant with advanced and finalized strength and balance focused HEP in order to maintain  functional progress and improve mobility. Baseline: IND and compliant (12/23) Goal status: MET  2.  Pt will improve FGA score to >/=26/30 in order to demonstrate improved balance and decreased fall risk. Baseline: 21/30; 22/30 (12/23) Goal status:  IN PROGRESS  3.  Pt will demonstrate a gait speed of >/=3.53 feet/sec in order to decrease risk for falls. Baseline: 2.55 ft/sec no AD SBA; 3.33 ft/sec no AD SBA (11/24); 2.82 ft/sec no AD CGA (12/23) Goal status: NOT MET  4.  Pt will ambulate >/=500 feet without AD independently over level and unlevel surfaces using appropriate knee control in order to promote household and community access. Baseline: ambulates w/ knee hyperextension no AD limited community distances SBA-CGA; 500 ft SBA no AD - ongoing hyperextension, mild left drift w/ distance (12/23) Goal status: IN PROGRESS   SHORT TERM GOALS = LONG TERM GOALS: Target date: 12/09/2024 (to match final appt)  1.  Pt will improve FGA score to >/=26/30 in order to demonstrate improved balance and decreased fall risk. Baseline: 21/30; 22/30 (12/23) Goal status: ONGOING  2.  Pt will demonstrate a gait speed of >/=3.53 feet/sec in order to decrease risk for falls. Baseline: 2.55 ft/sec no AD SBA; 3.33 ft/sec no AD SBA (11/24); 2.82 ft/sec no AD CGA (12/23) Goal status: ONGOING  3.  Pt will ambulate >/=500 feet without AD independently over level and unlevel surfaces using appropriate knee control in order to promote household and community access. Baseline: ambulates w/ knee hyperextension no AD limited community distances SBA-CGA; 500 ft SBA no AD - ongoing hyperextension, mild left drift w/ distance (12/23) Goal status: ONGOING  ASSESSMENT:  CLINICAL IMPRESSION: Focus of skilled PT session today on clarifying pt desire for knee cage vs AFO.  Elected to pursue knee cage as DF has recovered well.  She does well with NMR targeted at L quad control this visit, but does have some quad tendon pain  with weighted squats.  She can tolerate unweighted squatting motion without pain.  Made additions to HEP to progress pt in home setting and encourage gradual return to the gym as pt desires.  She may require clearance from her cardiologist for full return to gym following upcoming procedure.  Will continue per POC as appropriate.  OBJECTIVE IMPAIRMENTS: Abnormal gait, decreased activity tolerance, decreased balance, decreased coordination, decreased knowledge of use of DME, decreased strength, and improper body mechanics.   ACTIVITY LIMITATIONS: lifting, standing, squatting, transfers, and locomotion level  PARTICIPATION LIMITATIONS: driving, shopping, community activity, and occupation  PERSONAL FACTORS: Past/current experiences and 1-2 comorbidities: MDD/panic disorder are also affecting patient's functional outcome.   REHAB POTENTIAL: Excellent  CLINICAL DECISION MAKING: Evolving/moderate complexity  EVALUATION COMPLEXITY: Moderate  PLAN:  PT FREQUENCY: 1x/week + 2x/wk  PT DURATION: 8 weeks + 5 wks   PLANNED INTERVENTIONS: [id number]- PT Re-evaluation, 97750- Physical Performance Testing, 97110-Therapeutic exercises, 97530- Therapeutic activity, [id number]- Neuromuscular re-education, 97535- Self Care, [id number]- Manual therapy, [id number]- Gait training, [phone]- Orthotic Initial, [phone]- Orthotic/Prosthetic subsequent, [phone]- Aquatic Therapy, [phone]- Electrical stimulation (manual), Patient/Family education, Balance training, Stair training, Taping, Joint mobilization, Vestibular training, and DME instructions  PLAN FOR NEXT SESSION: Expand HEP prn, SLS and high level stability, treadmill training, spanish squats, hamstring strengthening - deadlift variations Aquatics:  Walking mechanics, SL elevated STS, core; NO AQUATIC HEP - no pool access;  Ai Chi postures 9-16  Check all possible CPT codes: See Planned Interventions List for Planned CPT Codes    Check all conditions that are expected to impact  treatment: Neurological condition and/or seizures, Psychological or psychiatric disorders, and Social determinants of health   If treatment provided at initial evaluation, no treatment charged due to lack of authorization.    Daved KATHEE Bull, PT, DPT 11/22/2024,  2:46 PM        "

## 2024-11-22 NOTE — Addendum Note (Signed)
 Addended by: CARILYN JODIE BRAVO on: 11/22/2024 02:32 PM   Modules accepted: Orders

## 2024-11-22 NOTE — Patient Instructions (Signed)
-   Squat on Decline Board  - 1 x daily - 5 x weekly - 2 sets - 10 reps - Lower Quarter Posteriolateral Reach  - 1 x daily - 5 x weekly - 2 sets - 10 reps - Lower Quarter Anterior Reach  - 1 x daily - 5 x weekly - 2 sets - 10 reps - Lower Quarter Posteriomedial Reach  - 1 x daily - 5 x weekly - 2 sets - 10 reps - Squatting Anti-Rotation Press  - 1 x daily - 5 x weekly - 2 sets - 10 reps

## 2024-11-24 ENCOUNTER — Encounter: Payer: Self-pay | Admitting: Physical Therapy

## 2024-11-24 ENCOUNTER — Ambulatory Visit: Admitting: Physical Therapy

## 2024-11-24 DIAGNOSIS — M6281 Muscle weakness (generalized): Secondary | ICD-10-CM

## 2024-11-24 DIAGNOSIS — R278 Other lack of coordination: Secondary | ICD-10-CM | POA: Diagnosis not present

## 2024-11-24 DIAGNOSIS — R208 Other disturbances of skin sensation: Secondary | ICD-10-CM

## 2024-11-24 DIAGNOSIS — R2681 Unsteadiness on feet: Secondary | ICD-10-CM

## 2024-11-24 DIAGNOSIS — I69354 Hemiplegia and hemiparesis following cerebral infarction affecting left non-dominant side: Secondary | ICD-10-CM

## 2024-11-24 DIAGNOSIS — R29898 Other symptoms and signs involving the musculoskeletal system: Secondary | ICD-10-CM

## 2024-11-24 NOTE — Therapy (Signed)
 " OUTPATIENT PHYSICAL THERAPY NEURO TREATMENT    Patient Name: Cassidy Bruce MRN: [id number] DOB:Nov 30, 1995, 29 y.o., female Today's Date: 11/24/2024   PCP: Greig JINNY Drones, NP REFERRING PROVIDER: Carilyn Prentice BRAVO, MD  END OF SESSION:  PT End of Session - 11/24/24 0935     Visit Number 11    Number of Visits 17   9 + 8   Date for Recertification  12/16/24   pushed out due to multi-D scheduling and aquatic needs/holiday scheduling   Authorization Type [place] [employer]    PT Start Time 0929    PT Stop Time 1013    PT Time Calculation (min) 44 min    Equipment Utilized During Treatment Gait belt    Activity Tolerance Patient tolerated treatment well    Behavior During Therapy [facility] for tasks assessed/performed          Past Medical History:  Diagnosis Date   ADHD    Depression    Stroke (HCC) 07/05/2024   Past Surgical History:  Procedure Laterality Date   TRANSESOPHAGEAL ECHOCARDIOGRAM (CATH LAB) N/A 07/07/2024   Procedure: TRANSESOPHAGEAL ECHOCARDIOGRAM;  Surgeon: Lonni Slain, MD;  Location: [facility] INVASIVE CV LAB;  Service: Cardiovascular;  Laterality: N/A;   Patient Active Problem List   Diagnosis Date Noted   Severe episode of recurrent major depressive disorder, without psychotic features (HCC) 07/15/2024   Panic disorder with agoraphobia and moderate panic attacks 07/15/2024   Thromboembolic stroke (HCC) 07/13/2024   PFO (patent foramen ovale) 07/07/2024   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   Hypokalemia 07/06/2024   Stroke (HCC) 07/05/2024   MDD (major depressive disorder), recurrent episode, moderate (HCC) 07/25/2018   Adjustment disorder with mixed anxiety and depressed mood     ONSET DATE: 07/05/2024 (CVA)  REFERRING DIAG: I63.9 (ICD-10-CM) - Cerebral infarction, unspecified  THERAPY DIAG:  Hemiplegia and hemiparesis following cerebral infarction affecting left non-dominant side (HCC)  Other lack of coordination  Muscle weakness  (generalized)  Unsteadiness on feet  Other symptoms and signs involving the musculoskeletal system  Other disturbances of skin sensation  Rationale for Evaluation and Treatment: Rehabilitation  SUBJECTIVE:                                                                                                                                                                                             SUBJECTIVE STATEMENT: Pt presents ambulatory independently, no AD, to [facility] facility.  No falls or acute changes.   Pt accompanied by: family member (Aunt - dropped her off)  PERTINENT HISTORY: MDD, CVA, PFO, panic disorder w/ agoraphobia and moderate panic attacks  Per inpt rehab discharge  summary: Presented 07/05/2024 with dizziness slurred speech headache and gait abnormality with left-sided weakness. Per family she had went to the gym on Monday, 07/04/2024 for weightlifting as usual no specific problems. Tuesday morning she awoke from sleep with headache and neck pain and by the afternoon patient with increasing dizziness vertigo as well as slurred speech. Cranial CT scan showed no acute intracranial abnormalities new right cerebellar infarct since prior study of 2021 felt to be possibly chronic. 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 Distal V2 segment reconstitution but with attenuated enhancement relative to the contralateral side along the remainder of its course. A 13 mm region of ischemia affecting both cerebral hemispheres worse on the left with no more core infarct. MRI identified acute infarcts within the cerebellar vermis and bilateral cerebellar hemispheres. Most notably large acute infarct present within the superior cerebellar artery territories bilaterally. Posterior fossa mass effect without cerebellar tonsillar herniation or evidence of obstructive hydrocephalus. Petechial hemorrhage within the cerebellar vermis and superior right cerebellar hemisphere. Patient did not receive TNK.  No thrombectomy needed for left VA occlusion as BA was patent.  PAIN:  Are you having pain? No  PRECAUTIONS: Fall and Other: Zio heart monitor  RED FLAGS: None   WEIGHT BEARING RESTRICTIONS: No  FALLS: Has patient fallen in last 6 months? No and day of CVA she slid to the floor to lay down  LIVING ENVIRONMENT: Lives with: lives alone Lives in: Transitional housing - hotel Stairs: No - mostly uses elevator but will walk down the stairs Has following equipment at home: shower chair and elevator and triangle rollator  PLOF: Independent - she has been trying to navigate crowds (went to [employer] and [employer])  PATIENT GOALS: my balance  OBJECTIVE:  Note: Objective measures were completed at Evaluation unless otherwise noted.  DIAGNOSTIC FINDINGS:  Brain MRI 07/11/2024: IMPRESSION: 1. Evolving early subacute bilateral cerebellar infarcts, left slightly worse than right. Possible mild interval expansion versus changes of Wallerian degeneration (favored) involving the midbrain since previous. Associated petechial hemorrhage without frank hemorrhagic transformation. 2. Loss of normal flow void within the left vertebral artery, consistent with previously identified dissection. 3. Otherwise stable and normal brain MRI.  COGNITION: Overall cognitive status: Impaired and delayed processing and speech impairments/slurred speech   SENSATION: Light touch: WFL  COORDINATION: BLE RAMS:  LLE fatigues and slows with prolonged task Heel-to-shin:  mildly dysmetric LLE  EDEMA:  None noted in BLE  MUSCLE TONE: None noted in LLE  POSTURE: forward head - very mild  LOWER EXTREMITY ROM:     Active  Right Eval Left Eval  Hip flexion Grossly WNL  Hip extension   Hip abduction   Hip adduction   Hip internal rotation   Hip external rotation   Knee flexion   Knee extension   Ankle dorsiflexion   Ankle plantarflexion    Ankle inversion    Ankle eversion     (Blank rows =  not tested)  LOWER EXTREMITY MMT:    MMT Right Eval Left Eval  Hip flexion 5 4+  Hip extension    Hip abduction 4+ 4  Hip adduction    Hip internal rotation    Hip external rotation    Knee flexion 5 4+  Knee extension 5 5  Ankle dorsiflexion 5 5  Ankle plantarflexion    Ankle inversion    Ankle eversion    (Blank rows = not tested)  BED MOBILITY:  Findings: Sit to supine Complete Independence Supine to  sit Complete Independence Rolling to Right Complete Independence Rolling to Left Complete Independence  TRANSFERS: Sit to stand: Complete Independence  Assistive device utilized: None     Stand to sit: Complete Independence  Assistive device utilized: None     Chair to chair: SBA  Assistive device utilized: None       RAMP:  Not tested  CURB:  Not tested  STAIRS: Not tested GAIT: Findings: Gait Characteristics: step through pattern, decreased arm swing- Left, decreased stride length, and genu recurvatum- Left, Distance walked: various clinic distances, Assistive device utilized:None, Level of assistance: SBA and CGA, and Comments: mild lateral left knee thrust in stance  FUNCTIONAL TESTS:  5 times sit to stand: 14.54 sec no UE support 10 meter walk test: 12.94 sec no AD CGA = 0.77 m/sec OR 2.55 ft/sec Functional gait assessment:  FUNCTIONAL GAIT ASSESSMENT  Date: 08/19/2024 Score  GAIT LEVEL SURFACE Instructions: Walk at your normal speed from here to the next mark (6 m) [20 ft]. (2) Mild impairment - Walks 6 m (20 ft) in less than 7 seconds but greater than 5.5 seconds, uses assistive device, slower speed, mild gait deviations, or deviates 15.24 -25.4 cm (6 -10 in) outside of the 30.48-cm (12-in) walkway width.  2.   CHANGE IN GAIT SPEED Instructions: Begin walking at your normal pace (for 1.5 m [5 ft]). When I tell you go, walk as fast as you can (for 1.5 m [5 ft]). When I tell you slow, walk as slowly as you can (for 1.5 m [5 ft]. (3) Normal - Able to  smoothly change walking speed without loss of balance or gait deviation. Shows a significant difference in walking speeds between normal, fast, and slow speeds. Deviates no more than 15.24 cm (6 in) outside of the 30.48-cm (12-in) walkway width.  3.    GAIT WITH HORIZONTAL HEAD TURNS Instructions: Walk from here to the next mark 6 m (20 ft) away. Begin walking at your normal pace. Keep walking straight; after 3 steps, turn your head to the right and keep walking straight while looking to the right. After 3 more steps, turn your head to the left and keep walking straight while looking left. Continue alternating looking right and left. (2) Mild impairment - Performs head turns smoothly with slight change in gait velocity (eg, minor disruption to smooth gait path), deviates 15.24 -25.4 cm (6 -10 in) outside 30.48-cm (12-in) walkway width, or uses an assistive device.  4.   GAIT WITH VERTICAL HEAD TURNS Instructions: Walk from here to the next mark (6 m [20 ft]). Begin walking at your normal pace. Keep walking straight; after 3 steps, tip your head up and keep walking straight while looking up. After 3 more steps, tip your head down, keep walking straight while looking down. Continue  alternating looking up and down every 3 steps until you have completed 2 repetitions in each direction. (3) Normal - Performs head turns with no change in gait. Deviates no more than 15.24 cm (6 in) outside 30.48-cm (12-in) walkway width.  5.  GAIT AND PIVOT TURN Instructions: Begin with walking at your normal pace. When I tell you, turn and stop, turn as quickly as you can to face the opposite direction and stop. (1) Moderate impairment - Turns slowly, requires verbal cueing, or requires several small steps to catch balance following turn and stop  6.   STEP OVER OBSTACLE Instructions: Begin walking at your normal speed. When you come to the shoe box,  step over it, not around it, and keep walking. (1) Moderate impairment - Is  able to step over one shoe box (11.43 cm [4.5 in] total height) but must slow down and adjust steps to clear box safely. May require verbal cueing.  7.   GAIT WITH NARROW BASE OF SUPPORT Instructions: Walk on the floor with arms folded across the chest, feet aligned heel to toe in tandem for a distance of 3.6 m [12 ft]. The number of steps taken in a straight line are counted for a maximum of 10 steps. (2) Mild impairment - Ambulates 7-9 steps  8.   GAIT WITH EYES CLOSED Instructions: Walk at your normal speed from here to the next mark (6 m [20 ft]) with your eyes closed. (2) Mild impairment - Walks 6 m (20 ft), uses assistive device, slower speed, mild gait deviations, deviates 15.24 -25.4 cm (6 -10 in) outside 30.48-cm (12-in) walkway width. Ambulates 6 m (20 ft) in less than 9 seconds but greater than 7 seconds  9.   AMBULATING BACKWARDS Instructions: Walk backwards until I tell you to stop (2) Mild impairment - Walks 6 m (20 ft), uses assistive device, slower speed, mild gait deviations, deviates 15.24 -25.4 cm (6 -10 in) outside 30.48-cm (12-in) walkway width  10. STEPS Instructions: Walk up these stairs as you would at home (ie, using the rail if necessary). At the top turn around and walk down. (3) Normal-Alternating feet, no rail.  Total 21/30   Interpretation of scores: Non-Specific Older Adults Cutoff Score: <=22/30 = risk of falls Parkinsons Disease Cutoff score <15/30= fall risk (Hoehn & Yahr 1-4)  Minimally Clinically Important Difference (MCID)  Stroke (acute, subacute, and chronic) = MDC: 4.2 points Vestibular (acute) = MDC: 6 points Community Dwelling Older Adults =  MCID: 4 points Parkinsons Disease  =  MDC: 4.3 points  (Academy of Neurologic Physical Therapy (nd). Functional Gait Assessment. Retrieved from https://www.neuropt.org/docs/default-source/cpgs/core-outcome-measures/function-gait-assessment-pocket-guide-proof9-(2).pdf?sfvrsn=b17f35043_0.)  PATIENT SURVEYS:  None  relevant to chief complaint and age range.                                                                                                                              TREATMENT DATE: 11/24/2024  Aquatic therapy at [facility] - pool temperature 90 degrees   Patient seen for aquatic therapy today.  Treatment took place in water  3.6-4.8 feet deep depending upon activity.  Patient entered and exited the pool via stairs using single rail w/ reciprocal pattern at mod I level.   Exercises: Water  walking warmup 4x18 ft forward and backward w/ large kickboard hold > laterally w/ bilateral moderate resistance water  paddles 4x18 ft each direction -Toe walking 4x18 ft; improved knee control in stance - pt using low resistance dumbbells for stability on last 2 laps -Heel walking 4x18 ft; cues to help correct and maintain controlled knee flexion -Ai Chi postures 9-13 performed x10 (bilaterally when applicable)  Patient requires buoyancy of the water  for  support for reduced fall risk with gait training and balance exercises with SBA support using return demonstration and verbal cuing. Exercises able to be performed safely in water  without the risk of fall compared to those same exercises performed on land; viscosity of water  needed for resistance for strengthening. Current of water  provides perturbations for challenging static and dynamic balance.    PATIENT EDUCATION: Education details: Requested knee cage order.  Aquatic rationale. Education method: Explanation, Demonstration, Verbal cues, and Handouts Education comprehension: verbalized understanding and needs further education  HOME EXERCISE PROGRAM: Access Code: [email or web address] URL: [email or web address] Date: 08/19/2024 Prepared by: Daved Bull  Exercises - Seated Hamstring Curls with Resistance  - 1 x daily - 7 x weekly - 2 sets - 10 reps - Standing Hamstring Curl with Resistance  - 1 x daily - 7 x weekly - 2 sets - 10 reps -  Squat with Chair Touch and Resistance Loop  - 1 x daily - 5 x weekly - 2 sets - 12 reps - Side Stepping with Resistance at Thighs  - 1 x daily - 5 x weekly - 3 sets - 10 reps - Modified Single-Leg Deadlift  - 1 x daily - 5 x weekly - 2 sets - 10 reps - Walking Step Over  - 1 x daily - 5 x weekly - 3 sets - 10 reps -Retro stepping - Standing Terminal Knee Extension with Resistance  - 1 x daily - 5 x weekly - 2-3 sets - 10 reps - Squat on Decline Board  - 1 x daily - 5 x weekly - 2 sets - 10 reps - Lower Quarter Posteriolateral Reach  - 1 x daily - 5 x weekly - 2 sets - 10 reps - Lower Quarter Anterior Reach  - 1 x daily - 5 x weekly - 2 sets - 10 reps - Lower Quarter Posteriomedial Reach  - 1 x daily - 5 x weekly - 2 sets - 10 reps - Squatting Anti-Rotation Press  - 1 x daily - 5 x weekly - 2 sets - 10 reps  GOALS: Goals reviewed with patient? Yes  SHORT TERM GOALS: Target date: 09/16/2024  Pt will be independent and compliant with introductory strength and balance focused HEP in order to maintain functional progress and improve mobility. Baseline:  Established on eval; progressed 11/24 Goal status: IN PROGRESS  2.  Pt will be assessed for most appropriate hyperextension management w/ edu on appropriate wear or use. Baseline: Discussed bracing vs taping options on eval; holding on knee cage as pt thinks it over (11/24) Goal status: IN PROGRESS  3.  Pt will initiate aquatic therapy for improved balance management and strengthening. Baseline: To be scheduled; pt actively in aquatics (11/24) Goal status: MET  4.  Pt will decrease 5xSTS to </=12 seconds w/o UE support in order to demonstrate decreased risk for falls and improved functional bilateral LE strength and power. Baseline: 14.54 sec no UE support; 10.59 sec no UE support (11/24) Goal status: MET  5.  Pt will demonstrate a gait speed of >/=2.75 feet/sec in order to decrease risk for falls. Baseline: 2.55 ft/sec no AD SBA; 3.33  ft/sec no AD SBA (11/24) Goal status: MET  LONG TERM GOALS: Target date: 10/14/2024  Pt will be independent and compliant with advanced and finalized strength and balance focused HEP in order to maintain functional progress and improve mobility. Baseline: IND and compliant (12/23) Goal status: MET  2.  Pt will improve FGA score to >/=26/30  in order to demonstrate improved balance and decreased fall risk. Baseline: 21/30; 22/30 (12/23) Goal status: IN PROGRESS  3.  Pt will demonstrate a gait speed of >/=3.53 feet/sec in order to decrease risk for falls. Baseline: 2.55 ft/sec no AD SBA; 3.33 ft/sec no AD SBA (11/24); 2.82 ft/sec no AD CGA (12/23) Goal status: NOT MET  4.  Pt will ambulate >/=500 feet without AD independently over level and unlevel surfaces using appropriate knee control in order to promote household and community access. Baseline: ambulates w/ knee hyperextension no AD limited community distances SBA-CGA; 500 ft SBA no AD - ongoing hyperextension, mild left drift w/ distance (12/23) Goal status: IN PROGRESS   SHORT TERM GOALS = LONG TERM GOALS: Target date: 12/09/2024 (to match final appt)  1.  Pt will improve FGA score to >/=26/30 in order to demonstrate improved balance and decreased fall risk. Baseline: 21/30; 22/30 (12/23) Goal status: ONGOING  2.  Pt will demonstrate a gait speed of >/=3.53 feet/sec in order to decrease risk for falls. Baseline: 2.55 ft/sec no AD SBA; 3.33 ft/sec no AD SBA (11/24); 2.82 ft/sec no AD CGA (12/23) Goal status: ONGOING  3.  Pt will ambulate >/=500 feet without AD independently over level and unlevel surfaces using appropriate knee control in order to promote household and community access. Baseline: ambulates w/ knee hyperextension no AD limited community distances SBA-CGA; 500 ft SBA no AD - ongoing hyperextension, mild left drift w/ distance (12/23) Goal status: ONGOING  ASSESSMENT:  CLINICAL IMPRESSION: Pt seen for ongoing  aquatic therapy modality.  She demonstrates improved knee control in water  environment as well as improvements in balance from prior attempts at walking variations.  Completed review of ai chi postures 9 through 13 for increased stability challenge.  Will continue per POC as appropriate in preparation for planned discharge at end of current visits.  OBJECTIVE IMPAIRMENTS: Abnormal gait, decreased activity tolerance, decreased balance, decreased coordination, decreased knowledge of use of DME, decreased strength, and improper body mechanics.   ACTIVITY LIMITATIONS: lifting, standing, squatting, transfers, and locomotion level  PARTICIPATION LIMITATIONS: driving, shopping, community activity, and occupation  PERSONAL FACTORS: Past/current experiences and 1-2 comorbidities: MDD/panic disorder are also affecting patient's functional outcome.   REHAB POTENTIAL: Excellent  CLINICAL DECISION MAKING: Evolving/moderate complexity  EVALUATION COMPLEXITY: Moderate  PLAN:  PT FREQUENCY: 1x/week + 2x/wk  PT DURATION: 8 weeks + 5 wks   PLANNED INTERVENTIONS: [id number]- PT Re-evaluation, 97750- Physical Performance Testing, 97110-Therapeutic exercises, 97530- Therapeutic activity, [id number]- Neuromuscular re-education, 97535- Self Care, [id number]- Manual therapy, [id number]- Gait training, [phone]- Orthotic Initial, [phone]- Orthotic/Prosthetic subsequent, [phone]- Aquatic Therapy, [phone]- Electrical stimulation (manual), Patient/Family education, Balance training, Stair training, Taping, Joint mobilization, Vestibular training, and DME instructions  PLAN FOR NEXT SESSION: Expand HEP prn, SLS and high level stability, treadmill training, spanish squats, hamstring strengthening - deadlift variations Aquatics: NO AQUATIC HEP - no pool access;  Ai Chi postures 14-16  Check all possible CPT codes: See Planned Interventions List for Planned CPT Codes    Check all conditions that are expected to impact treatment: Neurological  condition and/or seizures, Psychological or psychiatric disorders, and Social determinants of health   If treatment provided at initial evaluation, no treatment charged due to lack of authorization.    Daved KATHEE Bull, PT, DPT 11/24/2024, 10:13 AM        "

## 2024-11-29 ENCOUNTER — Telehealth: Payer: Self-pay

## 2024-11-29 ENCOUNTER — Encounter: Payer: Self-pay | Admitting: Neurology

## 2024-11-29 ENCOUNTER — Ambulatory Visit: Admitting: Physical Therapy

## 2024-11-29 NOTE — Telephone Encounter (Signed)
 Due to procedure cancellation 1/30, Ms. Norfleet's case has now been moved to 0730 with 0530 arrival time.  Left message to call back to confirm new time.

## 2024-11-29 NOTE — Telephone Encounter (Signed)
 Confirmed procedure date of 12/02/2024. Confirmed NEW arrival time of 0530 for procedure time at 0730. Reviewed pre-procedure instructions with patient. She understands to take all meds as directed through the morning of the procedure. She understands to take her ASA DOS. Contrast allergy? No The patient understands to call if questions/concerns arise prior to procedure. The patient was grateful for call and agreed with plan.

## 2024-11-30 ENCOUNTER — Ambulatory Visit (HOSPITAL_COMMUNITY)
Admission: EM | Admit: 2024-11-30 | Discharge: 2024-11-30 | Disposition: A | Discharge status: Home / Self Care | Attending: Family Medicine | Admitting: Family Medicine

## 2024-11-30 ENCOUNTER — Encounter: Payer: Self-pay | Admitting: Family

## 2024-11-30 ENCOUNTER — Encounter (HOSPITAL_COMMUNITY): Payer: Self-pay | Admitting: *Deleted

## 2024-11-30 ENCOUNTER — Ambulatory Visit (INDEPENDENT_AMBULATORY_CARE_PROVIDER_SITE_OTHER): Admitting: Family

## 2024-11-30 ENCOUNTER — Other Ambulatory Visit: Payer: Self-pay

## 2024-11-30 VITALS — BP 117/67 | HR 70 | Ht 64.0 in | Wt 153.0 lb

## 2024-11-30 DIAGNOSIS — R519 Headache, unspecified: Secondary | ICD-10-CM | POA: Diagnosis not present

## 2024-11-30 DIAGNOSIS — Z8673 Personal history of transient ischemic attack (TIA), and cerebral infarction without residual deficits: Secondary | ICD-10-CM

## 2024-11-30 DIAGNOSIS — G43909 Migraine, unspecified, not intractable, without status migrainosus: Secondary | ICD-10-CM

## 2024-11-30 NOTE — ED Notes (Signed)
 Patient is being discharged from the Urgent Care and sent to the Emergency Department via pov . Per Dr Rolinda, patient is in need of higher level of care due to limited resources, need for a higher level of care. Patient is aware and verbalizes understanding of plan of care.  Vitals:   11/30/24 1711  BP: 133/84  Pulse: 62  Resp: 20  Temp: 97.7 F (36.5 C)  SpO2: 95%

## 2024-11-30 NOTE — ED Triage Notes (Signed)
 Pt REPORTS HER HA STARTED Monday.PT HAD A CVA in  SEPT 2025. Pt DENIES BLURRED VISION.DOUBLE VISION AND NO LOSS OF VISION. Pt denies Nausea or vomiting.  PT denies any loss of movement of arms or legs.

## 2024-11-30 NOTE — Telephone Encounter (Signed)
 Noted. During office visit patient's aunt stated she is taking patient to the Emergency Department today.

## 2024-11-30 NOTE — Progress Notes (Signed)
 "   Patient ID: Cassidy Bruce, female    DOB: Jul 14, 1996  MRN: [id number]  CC: Follow-Up  Subjective: Cassidy Bruce is a 29 y.o. female who presents for follow-up. She is accompanied by her aunt.   Her concerns today include:  Migraines for several days. Denies red flag symptoms. States she is concerned due in the past headaches were related to her stroke. States she called established Neurology and was told to report to the Urgent Care for evaluation. However, patient states she decided to schedule an appointment with Primary Care instead.   Patient Active Problem List   Diagnosis Date Noted   Severe episode of recurrent major depressive disorder, without psychotic features (HCC) 07/15/2024   Panic disorder with agoraphobia and moderate panic attacks 07/15/2024   Thromboembolic stroke (HCC) 07/13/2024   PFO (patent foramen ovale) 07/07/2024   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   Hypokalemia 07/06/2024   Stroke (HCC) 07/05/2024   MDD (major depressive disorder), recurrent episode, moderate (HCC) 07/25/2018   Adjustment disorder with mixed anxiety and depressed mood      Medications Ordered Prior to Encounter[1]  Allergies[2]  Social History   Socioeconomic History   Marital status: Single    Spouse name: Not on file   Number of children: Not on file   Years of education: Not on file   Highest education level: Not on file  Occupational History   Not on file  Tobacco Use   Smoking status: Never   Smokeless tobacco: Never  Vaping Use   Vaping status: Never Used  Substance and Sexual Activity   Alcohol use: No   Drug use: No   Sexual activity: Yes    Birth control/protection: Implant  Other Topics Concern   Not on file  Social History Narrative   Not on file   Social Drivers of Health   Tobacco Use: Low Risk (11/30/2024)   Patient History    Smoking Tobacco Use: Never    Smokeless Tobacco Use: Never    Passive Exposure: Not on file  Financial Resource  Strain: High Risk (09/23/2024)   Overall Financial Resource Strain (CARDIA)    Difficulty of Paying Living Expenses: Very hard  Food Insecurity: No Food Insecurity (11/18/2024)   Epic    Worried About Programme Researcher, Broadcasting/film/video in the Last Year: Never true    Ran Out of Food in the Last Year: Never true  Transportation Needs: No Transportation Needs (11/18/2024)   Epic    Lack of Transportation (Medical): No    Lack of Transportation (Non-Medical): No  Physical Activity: Inactive (09/21/2024)   Exercise Vital Sign    Days of Exercise per Week: 0 days    Minutes of Exercise per Session: 0 min  Stress: No Stress Concern Present (09/21/2024)   [employer] of Occupational Health - Occupational Stress Questionnaire    Feeling of Stress: Only a little  Social Connections: Moderately Isolated (09/21/2024)   Social Connection and Isolation Panel    Frequency of Communication with Friends and Family: More than three times a week    Frequency of Social Gatherings with Friends and Family: More than three times a week    Attends Religious Services: 1 to 4 times per year    Active Member of [employer] or Organizations: No    Attends Banker Meetings: Never    Marital Status: Never married  Intimate Partner Violence: Not At Risk (11/18/2024)   Epic    Fear of Current or Ex-Partner: No  Emotionally Abused: No    Physically Abused: No    Sexually Abused: No  Depression (PHQ2-9): Low Risk (11/18/2024)   Depression (PHQ2-9)    PHQ-2 Score: 0  Alcohol Screen: Low Risk (09/21/2024)   Alcohol Screen    Last Alcohol Screening Score (AUDIT): 0  Housing: High Risk (11/18/2024)   Epic    Unable to Pay for Housing in the Last Year: No    Number of Times Moved in the Last Year: 0    Homeless in the Last Year: Yes  Utilities: Not At Risk (11/18/2024)   Epic    Threatened with loss of utilities: No  Health Literacy: Adequate Health Literacy (09/21/2024)   B1300 Health Literacy    Frequency of  need for help with medical instructions: Never    Family History  Problem Relation Age of Onset   Cancer Other    Diabetes Other    CAD Other    Healthy Mother     Past Surgical History:  Procedure Laterality Date   TRANSESOPHAGEAL ECHOCARDIOGRAM (CATH LAB) N/A 07/07/2024   Procedure: TRANSESOPHAGEAL ECHOCARDIOGRAM;  Surgeon: Lonni Slain, MD;  Location: [facility] INVASIVE CV LAB;  Service: Cardiovascular;  Laterality: N/A;    ROS: Review of Systems Negative except as stated above  PHYSICAL EXAM: BP 117/67   Pulse 70   Ht 5' 4 (1.626 m)   Wt 153 lb (69.4 kg)   LMP 10/23/2024 (Exact Date)   SpO2 97%   BMI 26.26 kg/m   Physical Exam HENT:     Head: Normocephalic and atraumatic.     Nose: Nose normal.     Mouth/Throat:     Mouth: Mucous membranes are moist.     Pharynx: Oropharynx is clear.  Eyes:     Extraocular Movements: Extraocular movements intact.     Conjunctiva/sclera: Conjunctivae normal.     Pupils: Pupils are equal, round, and reactive to light.  Cardiovascular:     Rate and Rhythm: Normal rate and regular rhythm.     Pulses: Normal pulses.     Heart sounds: Normal heart sounds.  Pulmonary:     Effort: Pulmonary effort is normal.     Breath sounds: Normal breath sounds.  Musculoskeletal:        General: Normal range of motion.     Cervical back: Normal range of motion and neck supple.  Neurological:     General: No focal deficit present.     Mental Status: She is alert and oriented to person, place, and time.  Psychiatric:        Mood and Affect: Mood normal.        Behavior: Behavior normal.     ASSESSMENT AND PLAN: 1. Migraine without status migrainosus, not intractable, unspecified migraine type (Primary) 2. History of stroke - Patient today in office with no cardiopulmonary/acute distress.  - I offered patient imaging order. Patient and patient's aunt declined. Patient's aunt states she is taking patient to the Emergency Department once  she leaves Primary Care office.  - Keep all scheduled appointments with established Neurology.    Patient was given the opportunity to ask questions.  Patient verbalized understanding of the plan and was able to repeat key elements of the plan. Patient was given clear instructions to go to Emergency Department or return to medical center if symptoms don't improve, worsen, or new problems develop.The patient verbalized understanding.   Return for Follow-up as needed.  Demir Titsworth JINNY Chute, NP      [1]  Current Outpatient Medications on File Prior to Visit  Medication Sig Dispense Refill   acetaminophen  (TYLENOL ) 500 MG tablet Take 500 mg by mouth every 6 (six) hours as needed.     aspirin  EC 81 MG tablet Take 1 tablet (81 mg total) by mouth daily. Swallow whole.     butalbital -acetaminophen -caffeine  (FIORICET ) 50-325-40 MG tablet Take 1 tablet by mouth every 8 (eight) hours as needed for headache (if not effective with tylenol ). 10 tablet 0   escitalopram  (LEXAPRO ) 5 MG tablet Take 1 tablet (5 mg total) by mouth daily. 90 tablet 0   ezetimibe  (ZETIA ) 10 MG tablet Take 1 tablet (10 mg total) by mouth daily. 90 tablet 3   ferrous sulfate  325 (65 FE) MG tablet Take 1 tablet (325 mg total) by mouth every other day. 30 tablet 0   rosuvastatin  (CRESTOR ) 40 MG tablet Take 1 tablet (40 mg total) by mouth daily. 90 tablet 3   citalopram  (CELEXA ) 20 MG tablet Take 1 tablet (20 mg total) by mouth daily. (Patient not taking: Reported on 11/30/2024) 90 tablet 0   oxyCODONE  (OXY IR/ROXICODONE ) 5 MG immediate release tablet Take 1 tablet (5 mg total) by mouth every 4 (four) hours as needed for breakthrough pain. (Patient not taking: Reported on 11/30/2024) 10 tablet 0   Vitamin D , Ergocalciferol , (DRISDOL ) 1.25 MG (50000 UNIT) CAPS capsule Take 1 capsule (50,000 Units total) by mouth every 7 (seven) days. (Patient not taking: Reported on 11/30/2024) 5 capsule 0   No current facility-administered medications on file  prior to visit.  [2] No Known Allergies  "

## 2024-12-01 ENCOUNTER — Telehealth: Payer: Self-pay

## 2024-12-01 ENCOUNTER — Encounter: Payer: Self-pay | Admitting: Physical Therapy

## 2024-12-01 ENCOUNTER — Ambulatory Visit: Admitting: Physical Therapy

## 2024-12-01 VITALS — BP 117/87 | HR 63

## 2024-12-01 DIAGNOSIS — R278 Other lack of coordination: Secondary | ICD-10-CM | POA: Diagnosis not present

## 2024-12-01 DIAGNOSIS — R208 Other disturbances of skin sensation: Secondary | ICD-10-CM

## 2024-12-01 DIAGNOSIS — R2681 Unsteadiness on feet: Secondary | ICD-10-CM

## 2024-12-01 DIAGNOSIS — I69354 Hemiplegia and hemiparesis following cerebral infarction affecting left non-dominant side: Secondary | ICD-10-CM

## 2024-12-01 DIAGNOSIS — M6281 Muscle weakness (generalized): Secondary | ICD-10-CM

## 2024-12-01 DIAGNOSIS — R29898 Other symptoms and signs involving the musculoskeletal system: Secondary | ICD-10-CM

## 2024-12-01 NOTE — ED Provider Notes (Signed)
 " [facility]   [id number] 11/30/24 Arrival Time: 1700  ASSESSMENT & PLAN:  1. Bad headache   2. History of stroke within last year    Normal neurological exam. Discussed limited neurodiagnostic resources here. After discussion she has decided to go to the ED via POV. Stable upon discharge.  Recommend:  Follow-up Information     Go to  [facility] Emergency Department at [facility].   Specialty: Emergency Medicine Contact information: [address] [place]  [phone]                 Reviewed expectations re: course of current medical issues. Questions answered. Outlined signs and symptoms indicating need for more acute intervention. Patient verbalized understanding. After Visit Summary given.   SUBJECTIVE: History from: Pt Patient is able to give a clear and coherent history.  Cassidy Bruce is a 29 y.o. female who presents with complaint of a a bad headache; x few days. H/O CVA 07/2024 that started with a bad headache. Today headache has eased a bit. Denies any new neurological symptoms.   OBJECTIVE:  Vitals:   11/30/24 1711  BP: 133/84  Pulse: 62  Resp: 20  Temp: 97.7 F (36.5 C)  SpO2: 95%    General appearance: alert; NAD HENT: normocephalic; atraumatic Eyes: PERRLA; EOMI; conjunctivae normal Neck: supple with FROM Lungs: unlabored CV: reg Extremities: no edema; symmetrical with no gross deformities Skin: warm and dry Neurologic: alert; mild speech abnormality that is a sequelae of previous stroke; is ambulatory; without significant extremity weakness Psychological: alert and cooperative; normal mood and affect  Labs:  Labs Reviewed - No data to display  Imaging: No results found.  Allergies[1]  Past Medical History:  Diagnosis Date   ADHD    Depression    Stroke (HCC) 07/05/2024   Social History   Socioeconomic History   Marital status: Single    Spouse name: Not on file    Number of children: Not on file   Years of education: Not on file   Highest education level: Not on file  Occupational History   Not on file  Tobacco Use   Smoking status: Never   Smokeless tobacco: Never  Vaping Use   Vaping status: Never Used  Substance and Sexual Activity   Alcohol use: No   Drug use: No   Sexual activity: Yes    Birth control/protection: Implant  Other Topics Concern   Not on file  Social History Narrative   Not on file   Social Drivers of Health   Tobacco Use: Low Risk (11/30/2024)   Patient History    Smoking Tobacco Use: Never    Smokeless Tobacco Use: Never    Passive Exposure: Not on file  Financial Resource Strain: High Risk (09/23/2024)   Overall Financial Resource Strain (CARDIA)    Difficulty of Paying Living Expenses: Very hard  Food Insecurity: No Food Insecurity (11/18/2024)   Epic    Worried About Programme Researcher, Broadcasting/film/video in the Last Year: Never true    Ran Out of Food in the Last Year: Never true  Transportation Needs: No Transportation Needs (11/18/2024)   Epic    Lack of Transportation (Medical): No    Lack of Transportation (Non-Medical): No  Physical Activity: Inactive (09/21/2024)   Exercise Vital Sign    Days of Exercise per Week: 0 days    Minutes of Exercise per Session: 0 min  Stress: No Stress Concern Present (09/21/2024)  [employer] of Occupational Health - Occupational Stress Questionnaire    Feeling of Stress: Only a little  Social Connections: Moderately Isolated (09/21/2024)   Social Connection and Isolation Panel    Frequency of Communication with Friends and Family: More than three times a week    Frequency of Social Gatherings with Friends and Family: More than three times a week    Attends Religious Services: 1 to 4 times per year    Active Member of [employer] or Organizations: No    Attends Banker Meetings: Never    Marital Status: Never married  Intimate Partner Violence: Not At Risk (11/18/2024)    Epic    Fear of Current or Ex-Partner: No    Emotionally Abused: No    Physically Abused: No    Sexually Abused: No  Depression (PHQ2-9): Low Risk (11/18/2024)   Depression (PHQ2-9)    PHQ-2 Score: 0  Alcohol Screen: Low Risk (09/21/2024)   Alcohol Screen    Last Alcohol Screening Score (AUDIT): 0  Housing: High Risk (11/18/2024)   Epic    Unable to Pay for Housing in the Last Year: No    Number of Times Moved in the Last Year: 0    Homeless in the Last Year: Yes  Utilities: Not At Risk (11/18/2024)   Epic    Threatened with loss of utilities: No  Health Literacy: Adequate Health Literacy (09/21/2024)   B1300 Health Literacy    Frequency of need for help with medical instructions: Never   Family History  Problem Relation Age of Onset   Cancer Other    Diabetes Other    CAD Other    Healthy Mother    Past Surgical History:  Procedure Laterality Date   TRANSESOPHAGEAL ECHOCARDIOGRAM (CATH LAB) N/A 07/07/2024   Procedure: TRANSESOPHAGEAL ECHOCARDIOGRAM;  Surgeon: Lonni Slain, MD;  Location: [facility] INVASIVE CV LAB;  Service: Cardiovascular;  Laterality: N/A;       [1] No Known Allergies    Rolinda Rogue, MD 12/01/24 [phone]  "

## 2024-12-01 NOTE — Telephone Encounter (Signed)
 Discussed with Dr. Wendel.  Informed the patient that her procedure tomorrow is cancelled as she will need further work up for her HA since it was presenting symptom for CVA.  As she does not currently have symptoms, she refuses to go to the ER.   ICE rep, [facility] rep, and cath lab notified of case cancellation.   Will send to Dr. Rosemarie to help facilitate sooner follow-up with his team (she was seen 09/14/2024 with 6 month follow-up planned).

## 2024-12-01 NOTE — Telephone Encounter (Signed)
 Upon chart review, the patient went to Urgent Care yesterday for a bad headache and was instructed to go to the ER for eval since her CVA in September 2025 started with a bad headache.  She never went to the ER.  Called the patient to check on her. She said she was advised to go to the ER but started feeling better and decided against it. She went home, went to sleep, and woke up this morning with no headache at all. She did not take any medications - she has a prescription for fioricet  but did not have it on hand.  She is scheduled for PFO closure tomorrow with Dr. Wendel. Will send to Dr. Wendel for review and OK to proceed (or cancel) tomorrow's case.

## 2024-12-01 NOTE — Therapy (Signed)
 " OUTPATIENT PHYSICAL THERAPY NEURO TREATMENT    Patient Name: Cassidy Bruce MRN: [id number] DOB:12-31-1995, 29 y.o., female Today's Date: 12/01/2024   PCP: Greig JINNY Drones, NP REFERRING PROVIDER: Carilyn Prentice BRAVO, MD  END OF SESSION:  PT End of Session - 12/01/24 1114     Visit Number 12    Number of Visits 17   9 + 8   Date for Recertification  12/16/24   pushed out due to multi-D scheduling and aquatic needs/holiday scheduling   Authorization Type [place] Medicaid    PT Start Time 1100    PT Stop Time 1146    PT Time Calculation (min) 46 min    Equipment Utilized During Treatment Other (comment)   aquatic devices as needed for safety and challenge   Activity Tolerance Patient tolerated treatment well    Behavior During Therapy [facility] for tasks assessed/performed          Past Medical History:  Diagnosis Date   ADHD    Depression    Stroke (HCC) 07/05/2024   Past Surgical History:  Procedure Laterality Date   TRANSESOPHAGEAL ECHOCARDIOGRAM (CATH LAB) N/A 07/07/2024   Procedure: TRANSESOPHAGEAL ECHOCARDIOGRAM;  Surgeon: Lonni Slain, MD;  Location: [facility] INVASIVE CV LAB;  Service: Cardiovascular;  Laterality: N/A;   Patient Active Problem List   Diagnosis Date Noted   Severe episode of recurrent major depressive disorder, without psychotic features (HCC) 07/15/2024   Panic disorder with agoraphobia and moderate panic attacks 07/15/2024   Thromboembolic stroke (HCC) 07/13/2024   PFO (patent foramen ovale) 07/07/2024   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   Hypokalemia 07/06/2024   Stroke (HCC) 07/05/2024   MDD (major depressive disorder), recurrent episode, moderate (HCC) 07/25/2018   Adjustment disorder with mixed anxiety and depressed mood     ONSET DATE: 07/05/2024 (CVA)  REFERRING DIAG: I63.9 (ICD-10-CM) - Cerebral infarction, unspecified  THERAPY DIAG:  Hemiplegia and hemiparesis following cerebral infarction affecting left non-dominant side  (HCC)  Other lack of coordination  Muscle weakness (generalized)  Unsteadiness on feet  Other symptoms and signs involving the musculoskeletal system  Other disturbances of skin sensation  Rationale for Evaluation and Treatment: Rehabilitation  SUBJECTIVE:                                                                                                                                                                                             SUBJECTIVE STATEMENT: Pt presents ambulatory independently, no AD, to [facility] facility.  No falls or near falls.  She does report headache similar to those she was having before her stroke that started Monday this week (last until this  morning).  She went to UC who encouraged her to go to the ED but she did not go.  She did not monitor BP at home.  She did begin her menstrual cycle this morning but is unsure if the headache is linked.  She reports no other notable changes and feels fine at current.  Pt speaks to nurse regarding procedure tomorrow and reports to therapist that nurse will speak to cardiologist about recent headache and let patient know of plan. Pt accompanied by: family member (Aunt - dropped her off)  PERTINENT HISTORY: MDD, CVA, PFO, panic disorder w/ agoraphobia and moderate panic attacks  Per inpt rehab discharge summary: Presented 07/05/2024 with dizziness slurred speech headache and gait abnormality with left-sided weakness. Per family she had went to the gym on Monday, 07/04/2024 for weightlifting as usual no specific problems. Tuesday morning she awoke from sleep with headache and neck pain and by the afternoon patient with increasing dizziness vertigo as well as slurred speech. Cranial CT scan showed no acute intracranial abnormalities new right cerebellar infarct since prior study of 2021 felt to be possibly chronic. 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 Distal V2 segment reconstitution but with attenuated enhancement  relative to the contralateral side along the remainder of its course. A 13 mm region of ischemia affecting both cerebral hemispheres worse on the left with no more core infarct. MRI identified acute infarcts within the cerebellar vermis and bilateral cerebellar hemispheres. Most notably large acute infarct present within the superior cerebellar artery territories bilaterally. Posterior fossa mass effect without cerebellar tonsillar herniation or evidence of obstructive hydrocephalus. Petechial hemorrhage within the cerebellar vermis and superior right cerebellar hemisphere. Patient did not receive TNK. No thrombectomy needed for left VA occlusion as BA was patent.  PAIN:  Are you having pain? No  PRECAUTIONS: Fall and Other: Zio heart monitor  RED FLAGS: None   WEIGHT BEARING RESTRICTIONS: No  FALLS: Has patient fallen in last 6 months? No and day of CVA she slid to the floor to lay down  LIVING ENVIRONMENT: Lives with: lives alone Lives in: Transitional housing - hotel Stairs: No - mostly uses elevator but will walk down the stairs Has following equipment at home: shower chair and elevator and triangle rollator  PLOF: Independent - she has been trying to navigate crowds (went to [employer] and [employer])  PATIENT GOALS: my balance  OBJECTIVE:  Note: Objective measures were completed at Evaluation unless otherwise noted.  DIAGNOSTIC FINDINGS:  Brain MRI 07/11/2024: IMPRESSION: 1. Evolving early subacute bilateral cerebellar infarcts, left slightly worse than right. Possible mild interval expansion versus changes of Wallerian degeneration (favored) involving the midbrain since previous. Associated petechial hemorrhage without frank hemorrhagic transformation. 2. Loss of normal flow void within the left vertebral artery, consistent with previously identified dissection. 3. Otherwise stable and normal brain MRI.  COGNITION: Overall cognitive status: Impaired and delayed  processing and speech impairments/slurred speech   SENSATION: Light touch: WFL  COORDINATION: BLE RAMS:  LLE fatigues and slows with prolonged task Heel-to-shin:  mildly dysmetric LLE  EDEMA:  None noted in BLE  MUSCLE TONE: None noted in LLE  POSTURE: forward head - very mild  LOWER EXTREMITY ROM:     Active  Right Eval Left Eval  Hip flexion Grossly WNL  Hip extension   Hip abduction   Hip adduction   Hip internal rotation   Hip external rotation   Knee flexion   Knee extension   Ankle dorsiflexion   Ankle  plantarflexion    Ankle inversion    Ankle eversion     (Blank rows = not tested)  LOWER EXTREMITY MMT:    MMT Right Eval Left Eval  Hip flexion 5 4+  Hip extension    Hip abduction 4+ 4  Hip adduction    Hip internal rotation    Hip external rotation    Knee flexion 5 4+  Knee extension 5 5  Ankle dorsiflexion 5 5  Ankle plantarflexion    Ankle inversion    Ankle eversion    (Blank rows = not tested)  BED MOBILITY:  Findings: Sit to supine Complete Independence Supine to sit Complete Independence Rolling to Right Complete Independence Rolling to Left Complete Independence  TRANSFERS: Sit to stand: Complete Independence  Assistive device utilized: None     Stand to sit: Complete Independence  Assistive device utilized: None     Chair to chair: SBA  Assistive device utilized: None       RAMP:  Not tested  CURB:  Not tested  STAIRS: Not tested GAIT: Findings: Gait Characteristics: step through pattern, decreased arm swing- Left, decreased stride length, and genu recurvatum- Left, Distance walked: various clinic distances, Assistive device utilized:None, Level of assistance: SBA and CGA, and Comments: mild lateral left knee thrust in stance  FUNCTIONAL TESTS:  5 times sit to stand: 14.54 sec no UE support 10 meter walk test: 12.94 sec no AD CGA = 0.77 m/sec OR 2.55 ft/sec Functional gait assessment:  FUNCTIONAL GAIT ASSESSMENT  Date:  08/19/2024 Score  GAIT LEVEL SURFACE Instructions: Walk at your normal speed from here to the next mark (6 m) [20 ft]. (2) Mild impairment - Walks 6 m (20 ft) in less than 7 seconds but greater than 5.5 seconds, uses assistive device, slower speed, mild gait deviations, or deviates 15.24 -25.4 cm (6 -10 in) outside of the 30.48-cm (12-in) walkway width.  2.   CHANGE IN GAIT SPEED Instructions: Begin walking at your normal pace (for 1.5 m [5 ft]). When I tell you go, walk as fast as you can (for 1.5 m [5 ft]). When I tell you slow, walk as slowly as you can (for 1.5 m [5 ft]. (3) Normal - Able to smoothly change walking speed without loss of balance or gait deviation. Shows a significant difference in walking speeds between normal, fast, and slow speeds. Deviates no more than 15.24 cm (6 in) outside of the 30.48-cm (12-in) walkway width.  3.    GAIT WITH HORIZONTAL HEAD TURNS Instructions: Walk from here to the next mark 6 m (20 ft) away. Begin walking at your normal pace. Keep walking straight; after 3 steps, turn your head to the right and keep walking straight while looking to the right. After 3 more steps, turn your head to the left and keep walking straight while looking left. Continue alternating looking right and left. (2) Mild impairment - Performs head turns smoothly with slight change in gait velocity (eg, minor disruption to smooth gait path), deviates 15.24 -25.4 cm (6 -10 in) outside 30.48-cm (12-in) walkway width, or uses an assistive device.  4.   GAIT WITH VERTICAL HEAD TURNS Instructions: Walk from here to the next mark (6 m [20 ft]). Begin walking at your normal pace. Keep walking straight; after 3 steps, tip your head up and keep walking straight while looking up. After 3 more steps, tip your head down, keep walking straight while looking down. Continue  alternating looking up and down every  3 steps until you have completed 2 repetitions in each direction. (3) Normal - Performs head  turns with no change in gait. Deviates no more than 15.24 cm (6 in) outside 30.48-cm (12-in) walkway width.  5.  GAIT AND PIVOT TURN Instructions: Begin with walking at your normal pace. When I tell you, turn and stop, turn as quickly as you can to face the opposite direction and stop. (1) Moderate impairment - Turns slowly, requires verbal cueing, or requires several small steps to catch balance following turn and stop  6.   STEP OVER OBSTACLE Instructions: Begin walking at your normal speed. When you come to the shoe box, step over it, not around it, and keep walking. (1) Moderate impairment - Is able to step over one shoe box (11.43 cm [4.5 in] total height) but must slow down and adjust steps to clear box safely. May require verbal cueing.  7.   GAIT WITH NARROW BASE OF SUPPORT Instructions: Walk on the floor with arms folded across the chest, feet aligned heel to toe in tandem for a distance of 3.6 m [12 ft]. The number of steps taken in a straight line are counted for a maximum of 10 steps. (2) Mild impairment - Ambulates 7-9 steps  8.   GAIT WITH EYES CLOSED Instructions: Walk at your normal speed from here to the next mark (6 m [20 ft]) with your eyes closed. (2) Mild impairment - Walks 6 m (20 ft), uses assistive device, slower speed, mild gait deviations, deviates 15.24 -25.4 cm (6 -10 in) outside 30.48-cm (12-in) walkway width. Ambulates 6 m (20 ft) in less than 9 seconds but greater than 7 seconds  9.   AMBULATING BACKWARDS Instructions: Walk backwards until I tell you to stop (2) Mild impairment - Walks 6 m (20 ft), uses assistive device, slower speed, mild gait deviations, deviates 15.24 -25.4 cm (6 -10 in) outside 30.48-cm (12-in) walkway width  10. STEPS Instructions: Walk up these stairs as you would at home (ie, using the rail if necessary). At the top turn around and walk down. (3) Normal-Alternating feet, no rail.  Total 21/30   Interpretation of scores: Non-Specific Older  Adults Cutoff Score: <=22/30 = risk of falls Parkinsons Disease Cutoff score <15/30= fall risk (Hoehn & Yahr 1-4)  Minimally Clinically Important Difference (MCID)  Stroke (acute, subacute, and chronic) = MDC: 4.2 points Vestibular (acute) = MDC: 6 points Community Dwelling Older Adults =  MCID: 4 points Parkinsons Disease  =  MDC: 4.3 points  (Academy of Neurologic Physical Therapy (nd). Functional Gait Assessment. Retrieved from https://www.neuropt.org/docs/default-source/cpgs/core-outcome-measures/function-gait-assessment-pocket-guide-proof9-(2).pdf?sfvrsn=b78f35043_0.)  PATIENT SURVEYS:  None relevant to chief complaint and age range.                                                                                                                              TREATMENT DATE: 12/01/2024  L BP in sitting prior to session: Today's Vitals   12/01/24 1113  BP: 117/87  Pulse: 63   There is no height or weight on file to calculate BMI.  Aquatic therapy at [facility] - pool temperature 88 degrees   Patient seen for aquatic therapy today.  Treatment took place in water  3.6-4.8 feet deep depending upon activity.  Patient entered and exited the pool via stairs using single rail w/ reciprocal pattern at mod I level.   Exercises: Water  walking warmup 4x18 ft forward, laterally, and backward w/ large kickboard hold -Added green water  paddles to ai chi postures 14-16 for increased resistance; x10 (bilaterally) -Plank crunch w/ pretzel noodle 3x10, time spent cuing and demonstrating improved depth of ROM and form -Sitting straddled on yellow mod resistance noodle practicing hand release from wall for balance over 4 minutes, able to hold unsupported </=10 seconds  Patient requires buoyancy of the water  for support for reduced fall risk with gait training and balance exercises with SBA support using return demonstration and verbal cuing. Exercises able to be performed safely in water  without the  risk of fall compared to those same exercises performed on land; viscosity of water  needed for resistance for strengthening. Current of water  provides perturbations for challenging static and dynamic balance.   PATIENT EDUCATION: Education details: Have knee cage order, but pt needs to schedule face-to-face w/ prescribing physician for insurance (do this within 90 days and call PT to notify so PT can send to [facility]).  Aquatic rationale.  Clearance for return to last PT visit following procedure.  If having severe prolonged headache again, please go to ED based on UC recommendation and possible screening needs due to prior CVA hx.  Plan for d/c next visit. Education method: Explanation, Demonstration, Verbal cues, and Handouts Education comprehension: verbalized understanding and needs further education  HOME EXERCISE PROGRAM: Access Code: [email or web address] URL: [email or web address] Date: 08/19/2024 Prepared by: Daved Bull  Exercises - Seated Hamstring Curls with Resistance  - 1 x daily - 7 x weekly - 2 sets - 10 reps - Standing Hamstring Curl with Resistance  - 1 x daily - 7 x weekly - 2 sets - 10 reps - Squat with Chair Touch and Resistance Loop  - 1 x daily - 5 x weekly - 2 sets - 12 reps - Side Stepping with Resistance at Thighs  - 1 x daily - 5 x weekly - 3 sets - 10 reps - Modified Single-Leg Deadlift  - 1 x daily - 5 x weekly - 2 sets - 10 reps - Walking Step Over  - 1 x daily - 5 x weekly - 3 sets - 10 reps -Retro stepping - Standing Terminal Knee Extension with Resistance  - 1 x daily - 5 x weekly - 2-3 sets - 10 reps - Squat on Decline Board  - 1 x daily - 5 x weekly - 2 sets - 10 reps - Lower Quarter Posteriolateral Reach  - 1 x daily - 5 x weekly - 2 sets - 10 reps - Lower Quarter Anterior Reach  - 1 x daily - 5 x weekly - 2 sets - 10 reps - Lower Quarter Posteriomedial Reach  - 1 x daily - 5 x weekly - 2 sets - 10 reps - Squatting Anti-Rotation Press  - 1 x daily -  5 x weekly - 2 sets - 10 reps  GOALS: Goals reviewed with patient? Yes  SHORT TERM GOALS: Target date: 09/16/2024  Pt will be independent and compliant with introductory strength and balance focused HEP in order to maintain  functional progress and improve mobility. Baseline:  Established on eval; progressed 11/24 Goal status: IN PROGRESS  2.  Pt will be assessed for most appropriate hyperextension management w/ edu on appropriate wear or use. Baseline: Discussed bracing vs taping options on eval; holding on knee cage as pt thinks it over (11/24) Goal status: IN PROGRESS  3.  Pt will initiate aquatic therapy for improved balance management and strengthening. Baseline: To be scheduled; pt actively in aquatics (11/24) Goal status: MET  4.  Pt will decrease 5xSTS to </=12 seconds w/o UE support in order to demonstrate decreased risk for falls and improved functional bilateral LE strength and power. Baseline: 14.54 sec no UE support; 10.59 sec no UE support (11/24) Goal status: MET  5.  Pt will demonstrate a gait speed of >/=2.75 feet/sec in order to decrease risk for falls. Baseline: 2.55 ft/sec no AD SBA; 3.33 ft/sec no AD SBA (11/24) Goal status: MET  LONG TERM GOALS: Target date: 10/14/2024  Pt will be independent and compliant with advanced and finalized strength and balance focused HEP in order to maintain functional progress and improve mobility. Baseline: IND and compliant (12/23) Goal status: MET  2.  Pt will improve FGA score to >/=26/30 in order to demonstrate improved balance and decreased fall risk. Baseline: 21/30; 22/30 (12/23) Goal status: IN PROGRESS  3.  Pt will demonstrate a gait speed of >/=3.53 feet/sec in order to decrease risk for falls. Baseline: 2.55 ft/sec no AD SBA; 3.33 ft/sec no AD SBA (11/24); 2.82 ft/sec no AD CGA (12/23) Goal status: NOT MET  4.  Pt will ambulate >/=500 feet without AD independently over level and unlevel surfaces using appropriate  knee control in order to promote household and community access. Baseline: ambulates w/ knee hyperextension no AD limited community distances SBA-CGA; 500 ft SBA no AD - ongoing hyperextension, mild left drift w/ distance (12/23) Goal status: IN PROGRESS   SHORT TERM GOALS = LONG TERM GOALS: Target date: 12/09/2024 (to match final appt)  1.  Pt will improve FGA score to >/=26/30 in order to demonstrate improved balance and decreased fall risk. Baseline: 21/30; 22/30 (12/23) Goal status: ONGOING  2.  Pt will demonstrate a gait speed of >/=3.53 feet/sec in order to decrease risk for falls. Baseline: 2.55 ft/sec no AD SBA; 3.33 ft/sec no AD SBA (11/24); 2.82 ft/sec no AD CGA (12/23) Goal status: ONGOING  3.  Pt will ambulate >/=500 feet without AD independently over level and unlevel surfaces using appropriate knee control in order to promote household and community access. Baseline: ambulates w/ knee hyperextension no AD limited community distances SBA-CGA; 500 ft SBA no AD - ongoing hyperextension, mild left drift w/ distance (12/23) Goal status: ONGOING  ASSESSMENT:  CLINICAL IMPRESSION: Pt seen for ongoing aquatic therapy modality.  Completed Ai Chi review with pt demonstrating excellent stability with perturbations and in bilateral SLS.  Pt has had recent prolonged headaches, but was asymptomatic during session today with normal BP assessment prior to interventions.  She is in agreement to discharge next session based on progress and PT will assist with knee cage once pt has face-to-face w/ MD.  Will proceed with planned D/C next session unless pt has status change.  OBJECTIVE IMPAIRMENTS: Abnormal gait, decreased activity tolerance, decreased balance, decreased coordination, decreased knowledge of use of DME, decreased strength, and improper body mechanics.   ACTIVITY LIMITATIONS: lifting, standing, squatting, transfers, and locomotion level  PARTICIPATION LIMITATIONS: driving, shopping,  community activity, and occupation  PERSONAL  FACTORS: Past/current experiences and 1-2 comorbidities: MDD/panic disorder are also affecting patient's functional outcome.   REHAB POTENTIAL: Excellent  CLINICAL DECISION MAKING: Evolving/moderate complexity  EVALUATION COMPLEXITY: Moderate  PLAN:  PT FREQUENCY: 1x/week + 2x/wk  PT DURATION: 8 weeks + 5 wks   PLANNED INTERVENTIONS: [id number]- PT Re-evaluation, 97750- Physical Performance Testing, 97110-Therapeutic exercises, 97530- Therapeutic activity, [id number]- Neuromuscular re-education, 97535- Self Care, [id number]- Manual therapy, [id number]- Gait training, [phone]- Orthotic Initial, [phone]- Orthotic/Prosthetic subsequent, [phone]- Aquatic Therapy, [phone]- Electrical stimulation (manual), Patient/Family education, Balance training, Stair training, Taping, Joint mobilization, Vestibular training, and DME instructions  PLAN FOR NEXT SESSION: Plan to D/C - how is she doing following procedure, any more headaches, has she scheduled face-to-face for knee cage?    Expand HEP prn, SLS and high level stability, treadmill training, spanish squats, hamstring strengthening - deadlift variations Aquatics: NO AQUATIC HEP - no pool access  Check all possible CPT codes: See Planned Interventions List for Planned CPT Codes    Check all conditions that are expected to impact treatment: Neurological condition and/or seizures, Psychological or psychiatric disorders, and Social determinants of health   If treatment provided at initial evaluation, no treatment charged due to lack of authorization.    Daved KATHEE Bull, PT, DPT 12/01/2024, 11:54 AM        "

## 2024-12-02 ENCOUNTER — Ambulatory Visit (HOSPITAL_COMMUNITY): Admission: RE | Admit: 2024-12-02 | Admitting: Internal Medicine

## 2024-12-02 ENCOUNTER — Encounter (HOSPITAL_COMMUNITY): Admission: RE | Payer: Self-pay | Source: Home / Self Care

## 2024-12-05 NOTE — Progress Notes (Unsigned)
 " [facility]   [phone] [phone]) [phone]   Clinic New consult Note   Patient Care Team: [name], NP as PCP - General (Nurse Practitioner) Jorja Nichole LABOR, RN as [facility] Care Management Ezzard Rolin BIRCH, LCSW as [facility] Care Management (Licensed Clinical Social Worker) 12/05/2024  CHIEF COMPLAINTS/PURPOSE OF CONSULTATION:  ***Anemia  ASSESSMENT & PLAN:  Cassidy Bruce is a 29 y.o. *** female with a history of ***  1. *** Anemia ***   PLAN: ***   HISTORY OF PRESENTING ILLNESS:  Cassidy Bruce 29 y.o. female is here because of *** anemia.  ***She was found to have abnormal CBC from *** ***She denies recent chest pain on exertion, shortness of breath on minimal exertion, pre-syncopal episodes, or palpitations. ***She had not noticed any recent bleeding such as epistaxis, hematuria or hematochezia ***The patient denies over the counter NSAID ingestion. She is not *** on antiplatelets agents. Her last colonoscopy was *** ***She had no prior history or diagnosis of cancer. Her age appropriate screening programs are up-to-date. ***She denies any pica and eats a variety of diet. ***She never donated blood or received blood transfusion ***The patient was prescribed oral iron supplements and she takes ***   REVIEW OF SYSTEMS:   Constitutional: Denies fevers, chills or abnormal night sweats Eyes: Denies blurriness of vision, double vision or watery eyes Ears, nose, mouth, throat, and face: Denies mucositis or sore throat Respiratory: Denies cough, dyspnea or wheezes Cardiovascular: Denies palpitation, chest discomfort or lower extremity swelling Gastrointestinal:  Denies nausea, heartburn or change in bowel habits Skin: Denies abnormal skin rashes Lymphatics: Denies new lymphadenopathy or easy bruising Neurological:Denies numbness, tingling or new weaknesses Behavioral/Psych: Mood is stable, no new changes   All other systems were reviewed with  the patient and are negative.   MEDICAL HISTORY:  Past Medical History:  Diagnosis Date   ADHD    Depression    Stroke (HCC) 07/05/2024    SURGICAL HISTORY: Past Surgical History:  Procedure Laterality Date   TRANSESOPHAGEAL ECHOCARDIOGRAM (CATH LAB) N/A 07/07/2024   Procedure: TRANSESOPHAGEAL ECHOCARDIOGRAM;  Surgeon: Lonni Slain, MD;  Location: [facility] INVASIVE CV LAB;  Service: Cardiovascular;  Laterality: N/A;    SOCIAL HISTORY: Social History   Socioeconomic History   Marital status: Single    Spouse name: Not on file   Number of children: Not on file   Years of education: Not on file   Highest education level: Not on file  Occupational History   Not on file  Tobacco Use   Smoking status: Never   Smokeless tobacco: Never  Vaping Use   Vaping status: Never Used  Substance and Sexual Activity   Alcohol use: No   Drug use: No   Sexual activity: Yes    Birth control/protection: Implant  Other Topics Concern   Not on file  Social History Narrative   Not on file   Social Drivers of Health   Tobacco Use: Low Risk (12/01/2024)   Patient History    Smoking Tobacco Use: Never    Smokeless Tobacco Use: Never    Passive Exposure: Not on file  Financial Resource Strain: High Risk (09/23/2024)   Overall Financial Resource Strain (CARDIA)    Difficulty of Paying Living Expenses: Very hard  Food Insecurity: No Food Insecurity (11/18/2024)   Epic    Worried About Radiation Protection Practitioner of Food in the Last Year: Never true    Ran Out of Food in the Last Year: Never true  Transportation Needs: No Transportation Needs (11/18/2024)   Epic    Lack of Transportation (Medical): No    Lack of Transportation (Non-Medical): No  Physical Activity: Inactive (09/21/2024)   Exercise Vital Sign    Days of Exercise per Week: 0 days    Minutes of Exercise per Session: 0 min  Stress: No Stress Concern Present (09/21/2024)   [employer] of Occupational Health - Occupational Stress  Questionnaire    Feeling of Stress: Only a little  Social Connections: Moderately Isolated (09/21/2024)   Social Connection and Isolation Panel    Frequency of Communication with Friends and Family: More than three times a week    Frequency of Social Gatherings with Friends and Family: More than three times a week    Attends Religious Services: 1 to 4 times per year    Active Member of [employer] or Organizations: No    Attends Banker Meetings: Never    Marital Status: Never married  Intimate Partner Violence: Not At Risk (11/18/2024)   Epic    Fear of Current or Ex-Partner: No    Emotionally Abused: No    Physically Abused: No    Sexually Abused: No  Depression (PHQ2-9): Low Risk (11/18/2024)   Depression (PHQ2-9)    PHQ-2 Score: 0  Alcohol Screen: Low Risk (09/21/2024)   Alcohol Screen    Last Alcohol Screening Score (AUDIT): 0  Housing: High Risk (11/18/2024)   Epic    Unable to Pay for Housing in the Last Year: No    Number of Times Moved in the Last Year: 0    Homeless in the Last Year: Yes  Utilities: Not At Risk (11/18/2024)   Epic    Threatened with loss of utilities: No  Health Literacy: Adequate Health Literacy (09/21/2024)   B1300 Health Literacy    Frequency of need for help with medical instructions: Never    FAMILY HISTORY: Family History  Problem Relation Age of Onset   Cancer Other    Diabetes Other    CAD Other    Healthy Mother     ALLERGIES:  has no known allergies.  MEDICATIONS:  Current Outpatient Medications  Medication Sig Dispense Refill   aspirin  EC 81 MG tablet Take 1 tablet (81 mg total) by mouth daily. Swallow whole.     butalbital -acetaminophen -caffeine  (FIORICET ) 50-325-40 MG tablet Take 1 tablet by mouth every 8 (eight) hours as needed for headache (if not effective with tylenol ). 10 tablet 0   citalopram  (CELEXA ) 20 MG tablet Take 1 tablet (20 mg total) by mouth daily. (Patient not taking: Reported on 11/30/2024) 90 tablet 0    escitalopram  (LEXAPRO ) 5 MG tablet Take 1 tablet (5 mg total) by mouth daily. 90 tablet 0   ezetimibe  (ZETIA ) 10 MG tablet Take 1 tablet (10 mg total) by mouth daily. 90 tablet 3   ferrous sulfate  325 (65 FE) MG tablet Take 1 tablet (325 mg total) by mouth every other day. 30 tablet 0   oxyCODONE  (OXY IR/ROXICODONE ) 5 MG immediate release tablet Take 1 tablet (5 mg total) by mouth every 4 (four) hours as needed for breakthrough pain. (Patient not taking: Reported on 11/30/2024) 10 tablet 0   rosuvastatin  (CRESTOR ) 40 MG tablet Take 1 tablet (40 mg total) by mouth daily. 90 tablet 3   Vitamin D , Ergocalciferol , (DRISDOL ) 1.25 MG (50000 UNIT) CAPS capsule Take 1 capsule (50,000 Units total) by mouth every 7 (seven) days. (Patient not taking: Reported on 11/30/2024) 5 capsule 0  No current facility-administered medications for this visit.    PHYSICAL EXAMINATION: ECOG PERFORMANCE STATUS: {CHL ONC ECOG [phone]}  There were no vitals filed for this visit. There were no vitals filed for this visit.  GENERAL:alert, no distress and comfortable SKIN: skin color, texture, turgor are normal, no rashes or significant lesions EYES: normal, conjunctiva are pink and non-injected, sclera clear OROPHARYNX:no exudate, no erythema and lips, buccal mucosa, and tongue normal  NECK: supple, thyroid normal size, non-tender, without nodularity LYMPH:  no palpable lymphadenopathy in the cervical, axillary or inguinal LUNGS: clear to auscultation and percussion with normal breathing effort HEART: regular rate & rhythm and no murmurs and no lower extremity edema ABDOMEN:abdomen soft, non-tender and normal bowel sounds Musculoskeletal:no cyanosis of digits and no clubbing  PSYCH: alert & oriented x 3 with fluent speech NEURO: no focal motor/sensory deficits  LABORATORY DATA:  I have reviewed the data as listed    Latest Ref Rng & Units 11/11/2024    3:30 PM 11/04/2024    1:44 PM 08/01/2024    6:14 AM  CBC   WBC 3.4 - 10.8 x10E3/uL 4.9  5.0  6.3   Hemoglobin 11.1 - 15.9 g/dL 86.5  85.5  87.1   Hematocrit 34.0 - 46.6 % 42.2  45.6  38.0   Platelets 150 - 450 x10E3/uL 108  140  93        Latest Ref Rng & Units 11/11/2024    3:30 PM 11/04/2024    1:44 PM 08/01/2024    6:14 AM  CMP  Glucose 70 - 99 mg/dL 84  77  78   BUN 6 - 20 mg/dL 11  10  8    Creatinine 0.57 - 1.00 mg/dL 9.16  9.21  9.00   Sodium 134 - 144 mmol/L 141  143  136   Potassium 3.5 - 5.2 mmol/L 4.3  4.6  3.8   Chloride 96 - 106 mmol/L 104  106  107   CO2 20 - 29 mmol/L 24  23  23    Calcium  8.7 - 10.2 mg/dL 9.3  9.2  8.8   Total Protein 6.0 - 8.5 g/dL  7.3    Total Bilirubin 0.0 - 1.2 mg/dL  0.4    Alkaline Phos 41 - 116 IU/L  87    AST 0 - 40 IU/L  73    ALT 0 - 32 IU/L  161       RADIOGRAPHIC STUDIES: I have personally reviewed the radiological images as listed and agreed with the findings in the report. No results found.  No orders of the defined types were placed in this encounter.   All questions were answered. The patient knows to call the clinic with any problems, questions or concerns. The total time spent in the appointment was {CHL ONC TIME VISIT - DTPQU:[id number]}.     Powell FORBES Lessen, NP 12/05/2024 8:10 PM  I, Izetta Neither, am acting as scribe for Onita Mattock, MD.   {Add scribe attestation statement}  "

## 2024-12-06 ENCOUNTER — Ambulatory Visit: Admitting: Physical Therapy

## 2024-12-07 ENCOUNTER — Inpatient Hospital Stay: Admitting: Nurse Practitioner

## 2024-12-07 ENCOUNTER — Inpatient Hospital Stay

## 2024-12-07 VITALS — BP 122/70 | HR 77 | Temp 97.6°F | Resp 17 | Wt 153.6 lb

## 2024-12-07 DIAGNOSIS — D696 Thrombocytopenia, unspecified: Secondary | ICD-10-CM

## 2024-12-07 LAB — CBC WITH DIFFERENTIAL (CANCER CENTER ONLY)
Abs Immature Granulocytes: 0.01 10*3/uL (ref 0.00–0.07)
Basophils Absolute: 0.1 10*3/uL (ref 0.0–0.1)
Basophils Relative: 1 %
Eosinophils Absolute: 0.2 10*3/uL (ref 0.0–0.5)
Eosinophils Relative: 4 %
HCT: 44.8 % (ref 36.0–46.0)
Hemoglobin: 14.9 g/dL (ref 12.0–15.0)
Immature Granulocytes: 0 %
Lymphocytes Relative: 28 %
Lymphs Abs: 1.6 10*3/uL (ref 0.7–4.0)
MCH: 25.8 pg — ABNORMAL LOW (ref 26.0–34.0)
MCHC: 33.3 g/dL (ref 30.0–36.0)
MCV: 77.6 fL — ABNORMAL LOW (ref 80.0–100.0)
Monocytes Absolute: 0.5 10*3/uL (ref 0.1–1.0)
Monocytes Relative: 9 %
Neutro Abs: 3.3 10*3/uL (ref 1.7–7.7)
Neutrophils Relative %: 58 %
Platelet Count: 155 10*3/uL (ref 150–400)
RBC: 5.77 MIL/uL — ABNORMAL HIGH (ref 3.87–5.11)
RDW: 13.6 % (ref 11.5–15.5)
WBC Count: 5.7 10*3/uL (ref 4.0–10.5)
nRBC: 0 % (ref 0.0–0.2)

## 2024-12-07 LAB — CMP (CANCER CENTER ONLY)
ALT: 81 U/L — ABNORMAL HIGH (ref 0–44)
AST: 39 U/L (ref 15–41)
Albumin: 4.6 g/dL (ref 3.5–5.0)
Alkaline Phosphatase: 84 U/L (ref 38–126)
Anion gap: 8 (ref 5–15)
BUN: 8 mg/dL (ref 6–20)
CO2: 30 mmol/L (ref 22–32)
Calcium: 9.7 mg/dL (ref 8.9–10.3)
Chloride: 106 mmol/L (ref 98–111)
Creatinine: 0.82 mg/dL (ref 0.44–1.00)
GFR, Estimated: >60 mL/min
Glucose, Bld: 74 mg/dL (ref 70–99)
Potassium: 4.4 mmol/L (ref 3.5–5.1)
Sodium: 144 mmol/L (ref 135–145)
Total Bilirubin: 0.4 mg/dL (ref 0.0–1.2)
Total Protein: 7.6 g/dL (ref 6.5–8.1)

## 2024-12-07 LAB — ANTITHROMBIN III: AntiThromb III Func: 92 % (ref 75–120)

## 2024-12-07 NOTE — Assessment & Plan Note (Signed)
 The patient has medical history consistent with vertebral artery dissection, thromboembolic stroke, major depression, GAD, and patent foramen ovale.  She was referred by her primary care provider due to chronic history of low platelet count.  Most recent labs from 11/04/2024 shows platelet count of 140.  Prior to that, in 07/2024, her platelet count was 93.  Her ferritin, folate, B12, and iron panels were checked at that time.  All were normal.  She had lupus anticoagulant, beta-2  glycoprotein, homocystine, and cardiolipin levels checked.  These were all within normal limits.  She had HIV checked and both 2023 and 2024.  Results were negative.  Reviewing her labs back to 08/2019, her platelet count has been mild to moderately low.  Prior to 2020, she did have some labs with normal platelet counts.  The patient was hospitalized in September 2025 due to acute thromboembolic stroke.  She presented to the ED with speech difficulty, dizziness, and balance impairment.  CT was concerning for right cerebellar infarct.  CTA of the head and neck showed left vertebral artery proximal occlusion and concern for dissection.  Subsequent CTP showed bilateral cerebellum penumbra, left > right, with no core infarct.  MRI of the brain confirmed bilateral cerebellar infarct left >right.  Unclear cause of embolic stroke.  She did have Nexplanon  implant for birth control.  Nexplanon  is a progesterone only birth control method.  This has been removed since her hospitalization.  She does not take estrogen containing OCP.  It appears that the only anticoagulant she is on is baby aspirin .   We discussed etiology of chronic thrombocytopenia.  Liver disease and chronic viral infections will be checked with acute hepatitis panel, CMP, and ultrasound of abdomen to evaluate for problems with liver and spleen.  Genetic predisposition to blood clots will be evaluated with hypercoagulable  lab panel.  Nutritional deficiencies will be checked with B12  and folate levels today.  If all testing is negative, we discussed diagnosis of ITP which is diagnosis of exclusion. Will notify patient of lab results as they via MyChart message.  Genetic labs will take 2 to 3 weeks.  Will schedule phone visit with patient in 3 weeks to review her labs.  -Advised her to contact cardiologist to inquire about new procedure date to repair PFO.  May need to restart Plavix  until 2 to 3 days prior to the procedure today.

## 2024-12-08 ENCOUNTER — Encounter: Payer: Self-pay | Admitting: Physical Medicine & Rehabilitation

## 2024-12-08 ENCOUNTER — Encounter: Attending: Physical Medicine & Rehabilitation | Admitting: Physical Medicine & Rehabilitation

## 2024-12-08 ENCOUNTER — Ambulatory Visit: Payer: Self-pay | Admitting: Nurse Practitioner

## 2024-12-08 VITALS — BP 119/79 | HR 61 | Ht 64.0 in | Wt 150.0 lb

## 2024-12-08 DIAGNOSIS — I69393 Ataxia following cerebral infarction: Secondary | ICD-10-CM | POA: Insufficient documentation

## 2024-12-08 DIAGNOSIS — I69322 Dysarthria following cerebral infarction: Secondary | ICD-10-CM | POA: Insufficient documentation

## 2024-12-08 LAB — HOMOCYSTEINE: Homocysteine: 6.5 umol/L (ref 0.0–14.5)

## 2024-12-08 LAB — PROTEIN S, TOTAL: Protein S Ag, Total: 83 % (ref 60–150)

## 2024-12-08 LAB — PROTEIN S ACTIVITY: Protein S Activity: 86 % (ref 63–140)

## 2024-12-08 LAB — LUPUS ANTICOAGULANT PANEL
DRVVT: 37.4 s (ref 0.0–47.0)
PTT Lupus Anticoagulant: 32.7 s (ref 0.0–43.5)

## 2024-12-08 LAB — HEPATITIS PANEL, ACUTE
HCV Ab: NONREACTIVE
Hep A IgM: NONREACTIVE
Hep B C IgM: NONREACTIVE
Hepatitis B Surface Ag: NONREACTIVE

## 2024-12-08 LAB — PROTEIN C ACTIVITY: Protein C Activity: 65 % — ABNORMAL LOW (ref 73–180)

## 2024-12-08 NOTE — Progress Notes (Signed)
 "  Subjective:    Patient ID: Cassidy Bruce, female    DOB: 10/05/1996, 29 y.o.   MRN: [id number]  HPI Discussed the use of AI scribe software for clinical note transcription with the patient, who gave verbal consent to proceed.  History of Present Illness Cassidy Bruce is a 29 year old female with bilateral cerebellar infarcts who presents for follow-up evaluation of functional recovery and activities of daily living.  She reports that she is able to do housework such as washing dishes and doing laundry throughout the day. 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  She anticipates mild difficulty with typing due to residual left hand weakness, though she notes improvement and is now able to use both hands for texting. The right hand is functioning well.  She was driving prior to her strokes and would like to resume driving. She feels confident in her ability to drive but acknowledges she may require additional practice. She denies visual disturbances and has no prior driving restrictions.  She denies history of seizures. EEG was negative for seizure activity.  She was evaluated by hematology for stroke risk factors. She was prescribed Repatha  injections but is currently taking oral medication as recommended by neurology due to insurance coverage issues.    Pain Inventory Average Pain 0 Pain Right Now 0 My pain is .  In the last 24 hours, has pain interfered with the following? General activity 0 Relation with others 0 Enjoyment of life 0 What TIME of day is your pain at its worst? night Sleep (in general) Good  Pain is worse with: . Pain improves with: . Relief from Meds: .  Family History  Problem Relation Age of Onset   Cancer Other    Diabetes Other    CAD Other    Healthy Mother    Social History   Socioeconomic History   Marital status: Single    Spouse name: Not on file   Number of children: Not on  file   Years of education: Not on file   Highest education level: Not on file  Occupational History   Not on file  Tobacco Use   Smoking status: Never   Smokeless tobacco: Never  Vaping Use   Vaping status: Never Used  Substance and Sexual Activity   Alcohol use: No   Drug use: No   Sexual activity: Yes    Birth control/protection: Implant  Other Topics Concern   Not on file  Social History Narrative   Not on file   Social Drivers of Health   Tobacco Use: Low Risk (12/01/2024)   Patient History    Smoking Tobacco Use: Never    Smokeless Tobacco Use: Never    Passive Exposure: Not on file  Financial Resource Strain: High Risk (09/23/2024)   Overall Financial Resource Strain (CARDIA)    Difficulty of Paying Living Expenses: Very hard  Food Insecurity: No Food Insecurity (11/18/2024)   Epic    Worried About Programme Researcher, Broadcasting/film/video in the Last Year: Never true    Ran Out of Food in the Last Year: Never true  Transportation Needs: No Transportation Needs (11/18/2024)   Epic    Lack of Transportation (Medical): No    Lack of Transportation (Non-Medical): No  Physical Activity: Inactive (09/21/2024)   Exercise Vital Sign    Days of Exercise per Week: 0 days    Minutes of Exercise per Session: 0 min  Stress: No  Stress Concern Present (09/21/2024)   [employer] of Occupational Health - Occupational Stress Questionnaire    Feeling of Stress: Only a little  Social Connections: Moderately Isolated (09/21/2024)   Social Connection and Isolation Panel    Frequency of Communication with Friends and Family: More than three times a week    Frequency of Social Gatherings with Friends and Family: More than three times a week    Attends Religious Services: 1 to 4 times per year    Active Member of Clubs or Organizations: No    Attends Banker Meetings: Never    Marital Status: Never married  Depression (PHQ2-9): Low Risk (12/07/2024)   Depression (PHQ2-9)    PHQ-2 Score:  0  Alcohol Screen: Low Risk (09/21/2024)   Alcohol Screen    Last Alcohol Screening Score (AUDIT): 0  Housing: High Risk (11/18/2024)   Epic    Unable to Pay for Housing in the Last Year: No    Number of Times Moved in the Last Year: 0    Homeless in the Last Year: Yes  Utilities: Not At Risk (11/18/2024)   Epic    Threatened with loss of utilities: No  Health Literacy: Adequate Health Literacy (09/21/2024)   B1300 Health Literacy    Frequency of need for help with medical instructions: Never   Past Surgical History:  Procedure Laterality Date   TRANSESOPHAGEAL ECHOCARDIOGRAM (CATH LAB) N/A 07/07/2024   Procedure: TRANSESOPHAGEAL ECHOCARDIOGRAM;  Surgeon: Lonni Slain, MD;  Location: [facility] INVASIVE CV LAB;  Service: Cardiovascular;  Laterality: N/A;   Past Surgical History:  Procedure Laterality Date   TRANSESOPHAGEAL ECHOCARDIOGRAM (CATH LAB) N/A 07/07/2024   Procedure: TRANSESOPHAGEAL ECHOCARDIOGRAM;  Surgeon: Lonni Slain, MD;  Location: [facility] INVASIVE CV LAB;  Service: Cardiovascular;  Laterality: N/A;   Past Medical History:  Diagnosis Date   ADHD    Depression    Stroke (HCC) 07/05/2024   BP 119/79   Pulse 61   Ht 5' 4 (1.626 m)   Wt 150 lb (68 kg)   LMP 11/29/2024 (Exact Date)   SpO2 98%   BMI 25.75 kg/m   Opioid Risk Score:   Fall Risk Score:  `1  Depression screen PHQ 2/9     12/07/2024    2:22 PM 11/18/2024   12:54 PM 11/04/2024    1:09 PM 10/25/2024    3:12 PM 10/14/2024    1:48 PM 09/21/2024    3:04 PM  Depression screen PHQ 2/9  Decreased Interest 0 0 0 1 0 0  Down, Depressed, Hopeless 0 0 1 1 1 1   PHQ - 2 Score 0 0 1 2 1 1   Altered sleeping      0  Tired, decreased energy      1  Change in appetite      0  Feeling bad or failure about yourself       1  Trouble concentrating      0  Moving slowly or fidgety/restless      0  Suicidal thoughts      0  PHQ-9 Score      3  Difficult doing work/chores      Somewhat difficult    Review of  Systems     Objective:   Physical Exam  General No acute distress mood affect appropriate Speech mild ataxic dysarthria Gait appears normal she is also able to perform tandem gait without loss of balance or evidence of truncal ataxia Romberg is negative Sharpened Romberg  is positive Motor strength is 5/5 in the right deltoid, bicep, tricep, grip, hip flexor, knee extensor, ankle dorsiflexor and plantar flexor She is 4+ on the left side in the same muscle groups Finger-nose-finger testing is normal on the right side and mild dysmetria on the left side Pt does exhibit hyperextension of the Left knee during heel strike phase of gait cycle      Assessment & Plan:   Assessment and Plan Assessment & Plan Bilateral cerebellar infarcts Significant functional recovery with independence in daily activities. Mild left hand dysmetria persists. No visual deficits or seizure history. Maintained on oral lipid lowering agents due to insurance limitations for PCSK9 inhibitor therapy. Appropriate for graduated return to driving and functional reintegration. - Graduated return to driving: start in empty parking lot, progress to side streets, then busier streets, with co-driver until safe. - Avoid nighttime and interstate driving for first month. - Practice typing on laptop to improve fine motor coordination. - Scheduled six-month follow-up per preference. - Provided instructions via MyChart and medical record.  Will order left Swedish knee cage to prevent left knee hyperextension injury .   "

## 2024-12-08 NOTE — Patient Instructions (Signed)
 Graduated return to driving instructions were provided. It is recommended that the patient first drives with another licensed driver in an empty parking lot. If the patient does well with this, and they can drive on a quiet street with the licensed driver. If the patient does well with this they can drive on a busy street with a licensed driver. If the patient does well with this, the next time out they can go by himself. For the first month after resuming driving, I recommend no nighttime or Interstate driving.

## 2024-12-08 NOTE — Telephone Encounter (Signed)
 Patient established with [facility]. Recommendation for the same to continue management of related chronic conditions including imaging. Please call their office to see if patient can be scheduled for a sooner appointment and any additional advisement including but not limited to imaging. During the interim report to the Emergency Department/Urgent Care/call 911 for immediate medical evaluation.

## 2024-12-09 ENCOUNTER — Ambulatory Visit: Admitting: Physical Therapy

## 2024-12-09 ENCOUNTER — Encounter: Payer: Self-pay | Admitting: Physical Therapy

## 2024-12-09 DIAGNOSIS — R2681 Unsteadiness on feet: Secondary | ICD-10-CM

## 2024-12-09 DIAGNOSIS — R278 Other lack of coordination: Secondary | ICD-10-CM

## 2024-12-09 DIAGNOSIS — R208 Other disturbances of skin sensation: Secondary | ICD-10-CM

## 2024-12-09 DIAGNOSIS — I69354 Hemiplegia and hemiparesis following cerebral infarction affecting left non-dominant side: Secondary | ICD-10-CM

## 2024-12-09 DIAGNOSIS — R29898 Other symptoms and signs involving the musculoskeletal system: Secondary | ICD-10-CM

## 2024-12-09 DIAGNOSIS — M6281 Muscle weakness (generalized): Secondary | ICD-10-CM

## 2024-12-09 NOTE — Telephone Encounter (Signed)
 Noted.

## 2024-12-09 NOTE — Therapy (Signed)
 " OUTPATIENT PHYSICAL THERAPY NEURO TREATMENT - DISCHARGE SUMMARY   Patient Name: Cassidy Bruce MRN: [id number] DOB:1996-07-17, 29 y.o., female Today's Date: 12/09/2024   PCP: Greig JINNY Drones, NP REFERRING PROVIDER: Carilyn Prentice BRAVO, MD  PHYSICAL THERAPY DISCHARGE SUMMARY  Visits from Start of Care: 13  Current functional level related to goals / functional outcomes: See clinical impression statement.   Remaining deficits: None significant.   Education / Equipment: Will send knee cage order to [facility] - will send note from MD as well.  Plan for D/C this visit.  Progress towards goals.  Process of returning w/ new referral as needed.   Patient agrees to discharge. Patient goals were met. Patient is being discharged due to maximized rehab potential.   END OF SESSION:  PT End of Session - 12/09/24 1454     Visit Number 13    Number of Visits 17   9 + 8   Date for Recertification  12/16/24   pushed out due to multi-D scheduling and aquatic needs/holiday scheduling   Authorization Type [place] Medicaid    PT Start Time 1448    PT Stop Time 1522    PT Time Calculation (min) 34 min    Equipment Utilized During Treatment Gait belt    Activity Tolerance Patient tolerated treatment well    Behavior During Therapy WFL for tasks assessed/performed          Past Medical History:  Diagnosis Date   ADHD    Depression    Stroke (HCC) 07/05/2024   Past Surgical History:  Procedure Laterality Date   TRANSESOPHAGEAL ECHOCARDIOGRAM (CATH LAB) N/A 07/07/2024   Procedure: TRANSESOPHAGEAL ECHOCARDIOGRAM;  Surgeon: Lonni Slain, MD;  Location: [facility] INVASIVE CV LAB;  Service: Cardiovascular;  Laterality: N/A;   Patient Active Problem List   Diagnosis Date Noted   Ataxia, post-stroke 12/08/2024   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 12/08/2024   Thrombocytopenia 12/07/2024   Severe episode of recurrent major depressive disorder, without psychotic features (HCC) 07/15/2024    Panic disorder with agoraphobia and moderate panic attacks 07/15/2024   Thromboembolic stroke (HCC) 07/13/2024   PFO (patent foramen ovale) 07/07/2024   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   Hypokalemia 07/06/2024   Stroke (HCC) 07/05/2024   MDD (major depressive disorder), recurrent episode, moderate (HCC) 07/25/2018   Adjustment disorder with mixed anxiety and depressed mood     ONSET DATE: 07/05/2024 (CVA)  REFERRING DIAG: I63.9 (ICD-10-CM) - Cerebral infarction, unspecified  THERAPY DIAG:  Hemiplegia and hemiparesis following cerebral infarction affecting left non-dominant side (HCC)  Other lack of coordination  Muscle weakness (generalized)  Unsteadiness on feet  Other symptoms and signs involving the musculoskeletal system  Other disturbances of skin sensation  Rationale for Evaluation and Treatment: Rehabilitation  SUBJECTIVE:  SUBJECTIVE STATEMENT: Pt presents ambulatory independently, no AD, to [facility] facility.  No falls or near falls.  She has had no further headaches since last visit, but reports she is to get a follow-up CT of her head before she can get her PFO procedure.  She has appt scheduled in August with PM&R and wants to go ahead and send knee cage order to [facility]. Pt accompanied by: family member (Aunt - dropped her off)  PERTINENT HISTORY: MDD, CVA, PFO, panic disorder w/ agoraphobia and moderate panic attacks  Per inpt rehab discharge summary: Presented 07/05/2024 with dizziness slurred speech headache and gait abnormality with left-sided weakness. Per family she had went to the gym on Monday, 07/04/2024 for weightlifting as usual no specific problems. Tuesday morning she awoke from sleep with headache and neck pain and by the afternoon patient with increasing dizziness vertigo  as well as slurred speech. Cranial CT scan showed no acute intracranial abnormalities new right cerebellar infarct since prior study of 2021 felt to be possibly chronic. 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 Distal V2 segment reconstitution but with attenuated enhancement relative to the contralateral side along the remainder of its course. A 13 mm region of ischemia affecting both cerebral hemispheres worse on the left with no more core infarct. MRI identified acute infarcts within the cerebellar vermis and bilateral cerebellar hemispheres. Most notably large acute infarct present within the superior cerebellar artery territories bilaterally. Posterior fossa mass effect without cerebellar tonsillar herniation or evidence of obstructive hydrocephalus. Petechial hemorrhage within the cerebellar vermis and superior right cerebellar hemisphere. Patient did not receive TNK. No thrombectomy needed for left VA occlusion as BA was patent.  PAIN:  Are you having pain? No  PRECAUTIONS: Fall and Other: Zio heart monitor  RED FLAGS: None   WEIGHT BEARING RESTRICTIONS: No  FALLS: Has patient fallen in last 6 months? No and day of CVA she slid to the floor to lay down  LIVING ENVIRONMENT: Lives with: lives alone Lives in: Transitional housing - hotel Stairs: No - mostly uses elevator but will walk down the stairs Has following equipment at home: shower chair and elevator and triangle rollator  PLOF: Independent - she has been trying to navigate crowds (went to [employer] and home[employer])  PATIENT GOALS: my balance  OBJECTIVE:  Note: Objective measures were completed at Evaluation unless otherwise noted.  DIAGNOSTIC FINDINGS:  Brain MRI 07/11/2024: IMPRESSION: 1. Evolving early subacute bilateral cerebellar infarcts, left slightly worse than right. Possible mild interval expansion versus changes of Wallerian degeneration (favored) involving the midbrain since previous. Associated  petechial hemorrhage without frank hemorrhagic transformation. 2. Loss of normal flow void within the left vertebral artery, consistent with previously identified dissection. 3. Otherwise stable and normal brain MRI.  COGNITION: Overall cognitive status: Impaired and delayed processing and speech impairments/slurred speech   SENSATION: Light touch: WFL  COORDINATION: BLE RAMS:  LLE fatigues and slows with prolonged task Heel-to-shin:  mildly dysmetric LLE  EDEMA:  None noted in BLE  MUSCLE TONE: None noted in LLE  POSTURE: forward head - very mild  LOWER EXTREMITY ROM:     Active  Right Eval Left Eval  Hip flexion Grossly WNL  Hip extension   Hip abduction   Hip adduction   Hip internal rotation   Hip external rotation   Knee flexion   Knee extension   Ankle dorsiflexion   Ankle plantarflexion    Ankle inversion    Ankle eversion     (Blank  rows = not tested)  LOWER EXTREMITY MMT:    MMT Right Eval Left Eval  Hip flexion 5 4+  Hip extension    Hip abduction 4+ 4  Hip adduction    Hip internal rotation    Hip external rotation    Knee flexion 5 4+  Knee extension 5 5  Ankle dorsiflexion 5 5  Ankle plantarflexion    Ankle inversion    Ankle eversion    (Blank rows = not tested)  BED MOBILITY:  Findings: Sit to supine Complete Independence Supine to sit Complete Independence Rolling to Right Complete Independence Rolling to Left Complete Independence  TRANSFERS: Sit to stand: Complete Independence  Assistive device utilized: None     Stand to sit: Complete Independence  Assistive device utilized: None     Chair to chair: SBA  Assistive device utilized: None       RAMP:  Not tested  CURB:  Not tested  STAIRS: Not tested GAIT: Findings: Gait Characteristics: step through pattern, decreased arm swing- Left, decreased stride length, and genu recurvatum- Left, Distance walked: various clinic distances, Assistive device utilized:None, Level of  assistance: SBA and CGA, and Comments: mild lateral left knee thrust in stance  FUNCTIONAL TESTS:  5 times sit to stand: 14.54 sec no UE support 10 meter walk test: 12.94 sec no AD CGA = 0.77 m/sec OR 2.55 ft/sec Functional gait assessment:  FUNCTIONAL GAIT ASSESSMENT  Date: 08/19/2024 Score  GAIT LEVEL SURFACE Instructions: Walk at your normal speed from here to the next mark (6 m) [20 ft]. (2) Mild impairment - Walks 6 m (20 ft) in less than 7 seconds but greater than 5.5 seconds, uses assistive device, slower speed, mild gait deviations, or deviates 15.24 -25.4 cm (6 -10 in) outside of the 30.48-cm (12-in) walkway width.  2.   CHANGE IN GAIT SPEED Instructions: Begin walking at your normal pace (for 1.5 m [5 ft]). When I tell you go, walk as fast as you can (for 1.5 m [5 ft]). When I tell you slow, walk as slowly as you can (for 1.5 m [5 ft]. (3) Normal - Able to smoothly change walking speed without loss of balance or gait deviation. Shows a significant difference in walking speeds between normal, fast, and slow speeds. Deviates no more than 15.24 cm (6 in) outside of the 30.48-cm (12-in) walkway width.  3.    GAIT WITH HORIZONTAL HEAD TURNS Instructions: Walk from here to the next mark 6 m (20 ft) away. Begin walking at your normal pace. Keep walking straight; after 3 steps, turn your head to the right and keep walking straight while looking to the right. After 3 more steps, turn your head to the left and keep walking straight while looking left. Continue alternating looking right and left. (2) Mild impairment - Performs head turns smoothly with slight change in gait velocity (eg, minor disruption to smooth gait path), deviates 15.24 -25.4 cm (6 -10 in) outside 30.48-cm (12-in) walkway width, or uses an assistive device.  4.   GAIT WITH VERTICAL HEAD TURNS Instructions: Walk from here to the next mark (6 m [20 ft]). Begin walking at your normal pace. Keep walking straight; after 3 steps, tip  your head up and keep walking straight while looking up. After 3 more steps, tip your head down, keep walking straight while looking down. Continue  alternating looking up and down every 3 steps until you have completed 2 repetitions in each direction. (3) Normal - Performs  head turns with no change in gait. Deviates no more than 15.24 cm (6 in) outside 30.48-cm (12-in) walkway width.  5.  GAIT AND PIVOT TURN Instructions: Begin with walking at your normal pace. When I tell you, turn and stop, turn as quickly as you can to face the opposite direction and stop. (1) Moderate impairment - Turns slowly, requires verbal cueing, or requires several small steps to catch balance following turn and stop  6.   STEP OVER OBSTACLE Instructions: Begin walking at your normal speed. When you come to the shoe box, step over it, not around it, and keep walking. (1) Moderate impairment - Is able to step over one shoe box (11.43 cm [4.5 in] total height) but must slow down and adjust steps to clear box safely. May require verbal cueing.  7.   GAIT WITH NARROW BASE OF SUPPORT Instructions: Walk on the floor with arms folded across the chest, feet aligned heel to toe in tandem for a distance of 3.6 m [12 ft]. The number of steps taken in a straight line are counted for a maximum of 10 steps. (2) Mild impairment - Ambulates 7-9 steps  8.   GAIT WITH EYES CLOSED Instructions: Walk at your normal speed from here to the next mark (6 m [20 ft]) with your eyes closed. (2) Mild impairment - Walks 6 m (20 ft), uses assistive device, slower speed, mild gait deviations, deviates 15.24 -25.4 cm (6 -10 in) outside 30.48-cm (12-in) walkway width. Ambulates 6 m (20 ft) in less than 9 seconds but greater than 7 seconds  9.   AMBULATING BACKWARDS Instructions: Walk backwards until I tell you to stop (2) Mild impairment - Walks 6 m (20 ft), uses assistive device, slower speed, mild gait deviations, deviates 15.24 -25.4 cm (6 -10 in) outside  30.48-cm (12-in) walkway width  10. STEPS Instructions: Walk up these stairs as you would at home (ie, using the rail if necessary). At the top turn around and walk down. (3) Normal-Alternating feet, no rail.  Total 21/30   Interpretation of scores: Non-Specific Older Adults Cutoff Score: <=22/30 = risk of falls Parkinsons Disease Cutoff score <15/30= fall risk (Hoehn & Yahr 1-4)  Minimally Clinically Important Difference (MCID)  Stroke (acute, subacute, and chronic) = MDC: 4.2 points Vestibular (acute) = MDC: 6 points Community Dwelling Older Adults =  MCID: 4 points Parkinsons Disease  =  MDC: 4.3 points  (Academy of Neurologic Physical Therapy (nd). Functional Gait Assessment. Retrieved from https://www.neuropt.org/docs/default-source/cpgs/core-outcome-measures/function-gait-assessment-pocket-guide-proof9-(2).pdf?sfvrsn=b89f35043_0.)  PATIENT SURVEYS:  None relevant to chief complaint and age range.                                                                                                                              TREATMENT DATE: 12/09/2024  -FGA:      [facility] PT Assessment - 12/09/24 1505       Functional Gait  Assessment   Gait assessed  Yes  Gait Level Surface Walks 20 ft in less than 7 sec but greater than 5.5 sec, uses assistive device, slower speed, mild gait deviations, or deviates 6-10 in outside of the 12 in walkway width.    Change in Gait Speed Able to smoothly change walking speed without loss of balance or gait deviation. Deviate no more than 6 in outside of the 12 in walkway width.    Gait with Horizontal Head Turns Performs head turns smoothly with slight change in gait velocity (eg, minor disruption to smooth gait path), deviates 6-10 in outside 12 in walkway width, or uses an assistive device.    Gait with Vertical Head Turns Performs head turns with no change in gait. Deviates no more than 6 in outside 12 in walkway width.    Gait and Pivot Turn Pivot turns  safely in greater than 3 sec and stops with no loss of balance, or pivot turns safely within 3 sec and stops with mild imbalance, requires small steps to catch balance.    Step Over Obstacle Is able to step over 2 stacked shoe boxes taped together (9 in total height) without changing gait speed. No evidence of imbalance.    Gait with Narrow Base of Support Is able to ambulate for 10 steps heel to toe with no staggering.    Gait with Eyes Closed Walks 20 ft, uses assistive device, slower speed, mild gait deviations, deviates 6-10 in outside 12 in walkway width. Ambulates 20 ft in less than 9 sec but greater than 7 sec.    Ambulating Backwards Walks 20 ft, no assistive devices, good speed, no evidence for imbalance, normal gait    Steps Alternating feet, no rail.    Total Score 26    FGA comment: 26/30 = low fall risk         - :  8.22 sec IND = 1.22 m/sec OR 4.01 ft/sec  -Explained process of obtaining knee cage, provided copy of order to pt in case she encounters any scheduling issues with [facility] or is unsure what is being asked for.  PATIENT EDUCATION: Education details: Will send knee cage order to [facility] - will send note from MD as well.  Plan for D/C this visit.  Progress towards goals.  Process of returning w/ new referral as needed. Education method: Explanation, Demonstration, Verbal cues, and Handouts Education comprehension: verbalized understanding and needs further education  HOME EXERCISE PROGRAM: Access Code: [email or web address] URL: [email or web address] Date: 08/19/2024 Prepared by: Daved Bull  Exercises - Seated Hamstring Curls with Resistance  - 1 x daily - 7 x weekly - 2 sets - 10 reps - Standing Hamstring Curl with Resistance  - 1 x daily - 7 x weekly - 2 sets - 10 reps - Squat with Chair Touch and Resistance Loop  - 1 x daily - 5 x weekly - 2 sets - 12 reps - Side Stepping with Resistance at Thighs  - 1 x daily - 5 x weekly - 3 sets - 10 reps -  Modified Single-Leg Deadlift  - 1 x daily - 5 x weekly - 2 sets - 10 reps - Walking Step Over  - 1 x daily - 5 x weekly - 3 sets - 10 reps -Retro stepping - Standing Terminal Knee Extension with Resistance  - 1 x daily - 5 x weekly - 2-3 sets - 10 reps - Squat on Decline Board  - 1 x daily - 5 x weekly - 2 sets - 10 reps - Lower  Quarter Posteriolateral Reach  - 1 x daily - 5 x weekly - 2 sets - 10 reps - Lower Quarter Anterior Reach  - 1 x daily - 5 x weekly - 2 sets - 10 reps - Lower Quarter Posteriomedial Reach  - 1 x daily - 5 x weekly - 2 sets - 10 reps - Squatting Anti-Rotation Press  - 1 x daily - 5 x weekly - 2 sets - 10 reps  GOALS: Goals reviewed with patient? Yes  SHORT TERM GOALS: Target date: 09/16/2024  Pt will be independent and compliant with introductory strength and balance focused HEP in order to maintain functional progress and improve mobility. Baseline:  Established on eval; progressed 11/24 Goal status: IN PROGRESS  2.  Pt will be assessed for most appropriate hyperextension management w/ edu on appropriate wear or use. Baseline: Discussed bracing vs taping options on eval; holding on knee cage as pt thinks it over (11/24) Goal status: IN PROGRESS  3.  Pt will initiate aquatic therapy for improved balance management and strengthening. Baseline: To be scheduled; pt actively in aquatics (11/24) Goal status: MET  4.  Pt will decrease 5xSTS to </=12 seconds w/o UE support in order to demonstrate decreased risk for falls and improved functional bilateral LE strength and power. Baseline: 14.54 sec no UE support; 10.59 sec no UE support (11/24) Goal status: MET  5.  Pt will demonstrate a gait speed of >/=2.75 feet/sec in order to decrease risk for falls. Baseline: 2.55 ft/sec no AD SBA; 3.33 ft/sec no AD SBA (11/24) Goal status: MET  LONG TERM GOALS: Target date: 10/14/2024  Pt will be independent and compliant with advanced and finalized strength and balance  focused HEP in order to maintain functional progress and improve mobility. Baseline: IND and compliant (12/23) Goal status: MET  2.  Pt will improve FGA score to >/=26/30 in order to demonstrate improved balance and decreased fall risk. Baseline: 21/30; 22/30 (12/23) Goal status: IN PROGRESS  3.  Pt will demonstrate a gait speed of >/=3.53 feet/sec in order to decrease risk for falls. Baseline: 2.55 ft/sec no AD SBA; 3.33 ft/sec no AD SBA (11/24); 2.82 ft/sec no AD CGA (12/23) Goal status: NOT MET  4.  Pt will ambulate >/=500 feet without AD independently over level and unlevel surfaces using appropriate knee control in order to promote household and community access. Baseline: ambulates w/ knee hyperextension no AD limited community distances SBA-CGA; 500 ft SBA no AD - ongoing hyperextension, mild left drift w/ distance (12/23) Goal status: IN PROGRESS   SHORT TERM GOALS = LONG TERM GOALS: Target date: 12/09/2024 (to match final appt)  1.  Pt will improve FGA score to >/=26/30 in order to demonstrate improved balance and decreased fall risk. Baseline: 21/30; 22/30 (12/23); 26/30 (2/6) Goal status:  MET  2.  Pt will demonstrate a gait speed of >/=3.53 feet/sec in order to decrease risk for falls. Baseline: 2.55 ft/sec no AD SBA; 3.33 ft/sec no AD SBA (11/24); 2.82 ft/sec no AD CGA (12/23); 4.01 ft/sec (2/6) Goal status: MET  3.  Pt will ambulate >/=500 feet without AD independently over level and unlevel surfaces using appropriate knee control in order to promote household and community access. Baseline: ambulates w/ knee hyperextension no AD limited community distances SBA-CGA; 500 ft SBA no AD - ongoing hyperextension, mild left drift w/ distance (12/23); pt able to ambulate IND in community >/=1000 ft (2/6) Goal status: MET  ASSESSMENT:  CLINICAL IMPRESSION: Pt met all  goals as written today demonstrating much lowered fall risk and confidence in independent mobility.  Did not  assess outdoor unlevel ambulation due to recent icy weather and safety concern, but pt reports she is living back home and managing daily community task in an ambulatory manner without falls or noted concerns.  Still pursuing left knee cage to improve safety of upright mechanics as pt continues to improve.  She is in agreement to discharge this visit based on progress made.  OBJECTIVE IMPAIRMENTS: Abnormal gait, decreased activity tolerance, decreased balance, decreased coordination, decreased knowledge of use of DME, decreased strength, and improper body mechanics.   ACTIVITY LIMITATIONS: lifting, standing, squatting, transfers, and locomotion level  PARTICIPATION LIMITATIONS: driving, shopping, community activity, and occupation  PERSONAL FACTORS: Past/current experiences and 1-2 comorbidities: MDD/panic disorder are also affecting patient's functional outcome.   REHAB POTENTIAL: Excellent  CLINICAL DECISION MAKING: Evolving/moderate complexity  EVALUATION COMPLEXITY: Moderate  PLAN:  PT FREQUENCY: 1x/week + 2x/wk  PT DURATION: 8 weeks + 5 wks   PLANNED INTERVENTIONS: 97164- PT Re-evaluation, 97750- Physical Performance Testing, 97110-Therapeutic exercises, 97530- Therapeutic activity, 97112- Neuromuscular re-education, [phone]- Self Care, [id number]- Manual therapy, [phone]- Gait training, [phone]- Orthotic Initial, [phone]- Orthotic/Prosthetic subsequent, [phone]- Aquatic Therapy, [phone]- Electrical stimulation (manual), Patient/Family education, Balance training, Stair training, Taping, Joint mobilization, Vestibular training, and DME instructions  PLAN FOR NEXT SESSION: N/A  Check all possible CPT codes: See Planned Interventions List for Planned CPT Codes    Check all conditions that are expected to impact treatment: Neurological condition and/or seizures, Psychological or psychiatric disorders, and Social determinants of health   If treatment provided at initial evaluation, no treatment charged  due to lack of authorization.    Daved KATHEE Bull, PT, DPT 12/09/2024, 3:25 PM        "

## 2024-12-14 ENCOUNTER — Telehealth: Admitting: Licensed Clinical Social Worker

## 2024-12-14 ENCOUNTER — Ambulatory Visit (HOSPITAL_COMMUNITY): Payer: Self-pay

## 2024-12-21 ENCOUNTER — Other Ambulatory Visit

## 2024-12-28 ENCOUNTER — Inpatient Hospital Stay: Admitting: Nurse Practitioner

## 2024-12-28 ENCOUNTER — Ambulatory Visit (HOSPITAL_COMMUNITY): Payer: Self-pay

## 2025-01-02 ENCOUNTER — Ambulatory Visit: Admitting: Physician Assistant

## 2025-01-05 ENCOUNTER — Ambulatory Visit: Admitting: Physician Assistant

## 2025-01-06 ENCOUNTER — Telehealth: Admitting: *Deleted

## 2025-03-29 ENCOUNTER — Ambulatory Visit (HOSPITAL_COMMUNITY): Payer: Self-pay

## 2025-04-11 ENCOUNTER — Ambulatory Visit: Admitting: Adult Health

## 2025-06-08 ENCOUNTER — Encounter: Admitting: Physical Medicine & Rehabilitation

## 2025-11-06 ENCOUNTER — Encounter: Payer: Self-pay | Admitting: Family
